# Patient Record
Sex: Male | Born: 1940 | Race: White | Hispanic: No | Marital: Married | Smoking: Former smoker
Health system: Southern US, Community
[De-identification: ages and names within clinical notes are randomized; demographics above are authoritative.]

## PROBLEM LIST (undated history)

## (undated) DIAGNOSIS — M069 Rheumatoid arthritis, unspecified: Secondary | ICD-10-CM

## (undated) DIAGNOSIS — I82409 Acute embolism and thrombosis of unspecified deep veins of unspecified lower extremity: Secondary | ICD-10-CM

## (undated) DIAGNOSIS — M1990 Unspecified osteoarthritis, unspecified site: Secondary | ICD-10-CM

## (undated) DIAGNOSIS — M1120 Other chondrocalcinosis, unspecified site: Secondary | ICD-10-CM

## (undated) DIAGNOSIS — I509 Heart failure, unspecified: Secondary | ICD-10-CM

## (undated) DIAGNOSIS — E139 Other specified diabetes mellitus without complications: Secondary | ICD-10-CM

## (undated) DIAGNOSIS — K5792 Diverticulitis of intestine, part unspecified, without perforation or abscess without bleeding: Secondary | ICD-10-CM

## (undated) DIAGNOSIS — J189 Pneumonia, unspecified organism: Secondary | ICD-10-CM

## (undated) DIAGNOSIS — D649 Anemia, unspecified: Secondary | ICD-10-CM

## (undated) DIAGNOSIS — Z95828 Presence of other vascular implants and grafts: Secondary | ICD-10-CM

## (undated) DIAGNOSIS — C61 Malignant neoplasm of prostate: Secondary | ICD-10-CM

## (undated) HISTORY — PX: EYE SURGERY: SHX253

## (undated) HISTORY — PX: CERVICAL FUSION: SHX112

## (undated) HISTORY — PX: TONSILLECTOMY: SUR1361

## (undated) HISTORY — DX: Rheumatoid arthritis, unspecified: M06.9

## (undated) HISTORY — PX: OTHER SURGICAL HISTORY: SHX169

## (undated) HISTORY — PX: JOINT REPLACEMENT: SHX530

---

## 1978-03-18 HISTORY — PX: BACK SURGERY: SHX140

## 1999-02-22 ENCOUNTER — Ambulatory Visit (HOSPITAL_COMMUNITY): Admission: RE | Admit: 1999-02-22 | Discharge: 1999-02-22 | Payer: Self-pay | Admitting: Neurosurgery

## 1999-02-22 ENCOUNTER — Encounter: Payer: Self-pay | Admitting: Neurosurgery

## 2010-05-17 HISTORY — PX: OTHER SURGICAL HISTORY: SHX169

## 2010-05-21 ENCOUNTER — Emergency Department (HOSPITAL_COMMUNITY)
Admission: EM | Admit: 2010-05-21 | Discharge: 2010-05-21 | Disposition: A | Discharge status: Nursing Home (Includes ICF) | Payer: Medicare Other | Attending: Emergency Medicine | Admitting: Emergency Medicine

## 2010-05-21 DIAGNOSIS — S61209A Unspecified open wound of unspecified finger without damage to nail, initial encounter: Secondary | ICD-10-CM | POA: Insufficient documentation

## 2010-05-21 DIAGNOSIS — W312XXA Contact with powered woodworking and forming machines, initial encounter: Secondary | ICD-10-CM | POA: Insufficient documentation

## 2010-05-21 DIAGNOSIS — IMO0002 Reserved for concepts with insufficient information to code with codable children: Secondary | ICD-10-CM | POA: Insufficient documentation

## 2010-05-21 LAB — POCT I-STAT, CHEM 8
BUN: 15 mg/dL (ref 6–23)
Calcium, Ion: 1.04 mmol/L — ABNORMAL LOW (ref 1.12–1.32)
Chloride: 106 mEq/L (ref 96–112)
Creatinine, Ser: 1.1 mg/dL (ref 0.4–1.5)
Glucose, Bld: 103 mg/dL — ABNORMAL HIGH (ref 70–99)
HCT: 40 % (ref 39.0–52.0)
Hemoglobin: 13.6 g/dL (ref 13.0–17.0)
Potassium: 4.1 mEq/L (ref 3.5–5.1)
Sodium: 139 mEq/L (ref 135–145)
TCO2: 25 mmol/L (ref 0–100)

## 2010-05-30 ENCOUNTER — Ambulatory Visit (HOSPITAL_COMMUNITY)
Admission: RE | Admit: 2010-05-30 | Discharge: 2010-05-30 | Disposition: A | Discharge status: Home / Self Care | Payer: Medicare Other | Source: Ambulatory Visit | Attending: Orthopedic Surgery | Admitting: Orthopedic Surgery

## 2010-05-30 ENCOUNTER — Other Ambulatory Visit: Payer: Self-pay | Admitting: Orthopedic Surgery

## 2010-05-30 ENCOUNTER — Other Ambulatory Visit (HOSPITAL_COMMUNITY): Payer: Self-pay | Admitting: Orthopedic Surgery

## 2010-05-30 ENCOUNTER — Encounter (HOSPITAL_COMMUNITY)
Admission: RE | Admit: 2010-05-30 | Discharge: 2010-05-30 | Payer: Medicare Other | Source: Ambulatory Visit | Attending: Orthopedic Surgery | Admitting: Orthopedic Surgery

## 2010-05-30 DIAGNOSIS — Z01818 Encounter for other preprocedural examination: Secondary | ICD-10-CM | POA: Insufficient documentation

## 2010-05-30 DIAGNOSIS — Z01812 Encounter for preprocedural laboratory examination: Secondary | ICD-10-CM | POA: Insufficient documentation

## 2010-05-30 DIAGNOSIS — M1710 Unilateral primary osteoarthritis, unspecified knee: Secondary | ICD-10-CM | POA: Insufficient documentation

## 2010-05-30 LAB — COMPREHENSIVE METABOLIC PANEL
ALT: 76 U/L — ABNORMAL HIGH (ref 0–53)
AST: 40 U/L — ABNORMAL HIGH (ref 0–37)
Albumin: 4 g/dL (ref 3.5–5.2)
Alkaline Phosphatase: 68 U/L (ref 39–117)
BUN: 15 mg/dL (ref 6–23)
CO2: 31 mEq/L (ref 19–32)
Calcium: 9.4 mg/dL (ref 8.4–10.5)
Chloride: 104 mEq/L (ref 96–112)
Creatinine, Ser: 1.05 mg/dL (ref 0.4–1.5)
GFR calc Af Amer: >60 mL/min (ref >60)
GFR calc non Af Amer: >60 mL/min (ref >60)
Glucose, Bld: 84 mg/dL (ref 70–99)
Potassium: 4.6 mEq/L (ref 3.5–5.1)
Sodium: 141 mEq/L (ref 135–145)
Total Bilirubin: 0.9 mg/dL (ref 0.3–1.2)
Total Protein: 7.6 g/dL (ref 6.0–8.3)

## 2010-05-30 LAB — URINALYSIS, ROUTINE W REFLEX MICROSCOPIC
Bilirubin Urine: NEGATIVE
Glucose, UA: NEGATIVE mg/dL
Hgb urine dipstick: NEGATIVE
Ketones, ur: NEGATIVE mg/dL
Nitrite: NEGATIVE
Protein, ur: NEGATIVE mg/dL
Specific Gravity, Urine: 1.018 (ref 1.005–1.030)
Urobilinogen, UA: 0.2 mg/dL (ref 0.0–1.0)
pH: 5.5 (ref 5.0–8.0)

## 2010-05-30 LAB — DIFFERENTIAL
Basophils Absolute: 0 10*3/uL (ref 0.0–0.1)
Basophils Relative: 0 % (ref 0–1)
Eosinophils Absolute: 0.1 10*3/uL (ref 0.0–0.7)
Eosinophils Relative: 2 % (ref 0–5)
Lymphocytes Relative: 34 % (ref 12–46)
Lymphs Abs: 1.3 10*3/uL (ref 0.7–4.0)
Monocytes Absolute: 0.3 10*3/uL (ref 0.1–1.0)
Monocytes Relative: 9 % (ref 3–12)
Neutro Abs: 2.1 10*3/uL (ref 1.7–7.7)
Neutrophils Relative %: 55 % (ref 43–77)

## 2010-05-30 LAB — CBC
HCT: 40.3 % (ref 39.0–52.0)
Hemoglobin: 13.6 g/dL (ref 13.0–17.0)
MCH: 31.3 pg (ref 26.0–34.0)
MCHC: 33.7 g/dL (ref 30.0–36.0)
MCV: 92.9 fL (ref 78.0–100.0)
Platelets: 183 10*3/uL (ref 150–400)
RBC: 4.34 MIL/uL (ref 4.22–5.81)
RDW: 11.6 % (ref 11.5–15.5)
WBC: 3.8 10*3/uL — ABNORMAL LOW (ref 4.0–10.5)

## 2010-05-30 LAB — PROTIME-INR
INR: 0.97 (ref 0.00–1.49)
Prothrombin Time: 13.1 seconds (ref 11.6–15.2)

## 2010-06-07 ENCOUNTER — Inpatient Hospital Stay (HOSPITAL_COMMUNITY)
Admission: RE | Admit: 2010-06-07 | Discharge: 2010-06-09 | DRG: 470 | Disposition: A | Discharge status: Home Health | Payer: Medicare Other | Source: Ambulatory Visit | Attending: Orthopedic Surgery | Admitting: Orthopedic Surgery

## 2010-06-07 DIAGNOSIS — M1710 Unilateral primary osteoarthritis, unspecified knee: Principal | ICD-10-CM | POA: Diagnosis present

## 2010-06-07 DIAGNOSIS — D62 Acute posthemorrhagic anemia: Secondary | ICD-10-CM | POA: Diagnosis not present

## 2010-06-07 DIAGNOSIS — M5030 Other cervical disc degeneration, unspecified cervical region: Secondary | ICD-10-CM | POA: Diagnosis present

## 2010-06-07 DIAGNOSIS — M21169 Varus deformity, not elsewhere classified, unspecified knee: Secondary | ICD-10-CM | POA: Diagnosis present

## 2010-06-07 DIAGNOSIS — K5730 Diverticulosis of large intestine without perforation or abscess without bleeding: Secondary | ICD-10-CM | POA: Diagnosis present

## 2010-06-07 LAB — ABO/RH: ABO/RH(D): A POS

## 2010-06-07 LAB — CROSSMATCH

## 2010-06-08 LAB — CBC
HCT: 34.2 % — ABNORMAL LOW (ref 39.0–52.0)
Hemoglobin: 11.3 g/dL — ABNORMAL LOW (ref 13.0–17.0)
RBC: 3.67 MIL/uL — ABNORMAL LOW (ref 4.22–5.81)
RDW: 11.7 % (ref 11.5–15.5)
WBC: 5.5 10*3/uL (ref 4.0–10.5)

## 2010-06-08 LAB — BASIC METABOLIC PANEL
GFR calc non Af Amer: >60 mL/min (ref >60)
Glucose, Bld: 141 mg/dL — ABNORMAL HIGH (ref 70–99)
Potassium: 5.1 mEq/L (ref 3.5–5.1)
Sodium: 136 mEq/L (ref 135–145)

## 2010-06-09 LAB — CBC
HCT: 32.6 % — ABNORMAL LOW (ref 39.0–52.0)
Hemoglobin: 10.6 g/dL — ABNORMAL LOW (ref 13.0–17.0)
MCV: 92.6 fL (ref 78.0–100.0)
WBC: 6.1 10*3/uL (ref 4.0–10.5)

## 2010-06-09 LAB — BASIC METABOLIC PANEL
BUN: 9 mg/dL (ref 6–23)
CO2: 28 mEq/L (ref 19–32)
Chloride: 107 mEq/L (ref 96–112)
Glucose, Bld: 121 mg/dL — ABNORMAL HIGH (ref 70–99)
Potassium: 4 mEq/L (ref 3.5–5.1)
Sodium: 138 mEq/L (ref 135–145)

## 2010-06-13 NOTE — Consult Note (Signed)
  NAMEDOMANI, BAKOS NO.:  [id number]  MEDICAL RECORD NO.:  [id number]           PATIENT TYPE:  E  LOCATION:  [facility]                         FACILITY:  [facility]  PHYSICIAN:  Artist Pais. Tahliyah Anagnos, M.D.DATE OF BIRTH:  [birth year]  DATE OF CONSULTATION:  05/21/2010 DATE OF DISCHARGE:  05/21/2010                                CONSULTATION   REQUESTING PHYSICIAN:  Gwyneth Sprout, MD  REASON FOR CONSULTATION:  Mr. Rollo is a 70 year old right-hand dominant male transferred from [facility] by private car, status post table saw injury to nondominant left long finger.  He is 70 years old.  ALLERGIES:  No known drug allergies.  CURRENT MEDICATIONS:  He is currently taking fish oil on a daily basis. No other medications.  No recent hospitalizations or surgeries.  He is being taken to the operating room in 2 weeks for a knee replacement with Dr. Jonny Ruiz Rendall here in [place].  He smokes cigars occasionally and occasional alcohol use.  PHYSICAL EXAMINATION:  GENERAL:  A well-nourished male, pleasant, alert, and oriented x3. EXTREMITIES:  He has a longitudinal laceration of the long finger from PIP to the tip with exposed tendon as well as appears to be a fracture of the proximal middle phalanx without an x-ray with questionable DIP weakness with extension.  IMPRESSION:  A 70 year old male with an open table saw injury to his nondominant left long finger.  RECOMMENDATIONS:  To be taken to the operating room for explore and repair as needed.  He understands the risks and benefits, we will do this as soon as  possible here at [facility] as an outpatient with possible 23-hour stay.     Artist Pais Mina Marble, M.D.     MAW/MEDQ  D:  05/21/2010  T:  05/22/2010  Job:  [id number]  Electronically Signed by Dairl Ponder M.D. on 06/13/2010 11:28:04 AM

## 2010-06-13 NOTE — Op Note (Signed)
  NAMECHRISTIANJAMES, David Joseph NO.:  [id number]  MEDICAL RECORD NO.:  [id number]           PATIENT TYPE:  E  LOCATION:  [facility]                         FACILITY:  [facility]  PHYSICIAN:  Artist Pais. Veldon Wager, M.D.DATE OF BIRTH:  1941/01/26  DATE OF PROCEDURE:  05/21/2010 DATE OF DISCHARGE:  05/21/2010                              OPERATIVE REPORT   PREOPERATIVE DIAGNOSIS:  Table saw injury, left long finger with open middle phalangeal fracture with extensor tendon laceration.  POSTOPERATIVE DIAGNOSIS:  Table saw injury, left long finger with open middle phalangeal fracture with extensor tendon laceration.  PROCEDURES:  I and D of above, debridement of devitalized tissue, as well as primary repair of extensor tendon, left long finger and open treatment, middle phalangeal fracture, left long finger.  SURGEON:  Artist Pais. Mina Marble, MD  ASSISTANT:  None.  ANESTHESIA:  General.  TOURNIQUET TIME:  28 minutes.  COMPLICATIONS:  None.  DRAINS:  None.  The patient was taken to the operating suite.  After induction of adequate general anesthesia, left upper extremity was prepped and draped in usual sterile fashion.  An Esmarch was used to exsanguinate the limb. Tourniquet was then inflated to 250 mmHg.  At this point in time, a longitudinal wound from the PIP joint to the tip of finger starting midline of the PIP joint and going to the ulnar border was examined. There was devitalized tissue, some clot, and some foreign material, and the wound was thoroughly irrigated with a liter of normal saline and debrided of all nonviable material.  There was an oblique laceration in extensor mechanism from the PIP joint to the DIP joint.  This was carefully reapproximated using 4-0 Mersilene in a running locked epitendinous-type suture.  There was also an open fracture of the middle phalanx as well on the ulnar side, which was debrided of clot, treated with closed treatment after a  thorough debridement.  There was some loss of the eponychial fold on the ulnar side.  This was repaired using 4-0 Vicryl, suture in the skin edge to the nail plate along the ulnar side. At the end of the procedure, the wound was approximated well with the extensor tendon approximated as well, was then dressed with Xeroform, 4 x 4's, and Coban wrap with the MP, PIP, and DIP joints in extension.  The patient also had 0.25% plain Marcaine injected for pain control.  The patient tolerated the procedure well, returned in stable fashion.     Artist Pais Mina Marble, M.D.     MAW/MEDQ  D:  05/21/2010  T:  05/22/2010  Job:  [id number]  Electronically Signed by Dairl Ponder M.D. on 06/13/2010 11:27:59 AM

## 2010-06-19 NOTE — Op Note (Signed)
David Joseph, David Joseph               ACCOUNT NO.:  [id number]  MEDICAL RECORD NO.:  [id number]           PATIENT TYPE:  I  LOCATION:  1608                         FACILITY:  [facility]  PHYSICIAN:  Yaiden Yang L. Rendall, M.D.  DATE OF BIRTH:  [birth year]  DATE OF PROCEDURE: DATE OF DISCHARGE:                              OPERATIVE REPORT   PREOPERATIVE DIAGNOSIS:  Osteoarthritis, right knee.  SURGICAL PROCEDURES:  Right LCS total knee arthroplasty with computer navigation assistance.  POSTOPERATIVE DIAGNOSIS:  Osteoarthritis, right knee.  SURGEON:  Aric Jost L. Rendall, M.D.  ASSISTANT:  Legrand Pitts. Duffy, P.A., present and participating in entire procedure.  ANESTHESIA:  General with a right femoral nerve block.  PATHOLOGY:  The patient has bone against bone medially with fixed varus deformity of 4 degrees and peeling hyaline cartilage on the surfaces of the medial femoral condyle and tibia.  JUSTIFICATION FOR PROCEDURE:  Chronic pain despite conservative measures.  DESCRIPTION OF PROCEDURE:  Under general anesthesia with femoral nerve block, the right leg was prepared with DuraPrep and draped as a sterile field.  It was wrapped out with an Esmarch and a sterile tourniquet was elevated at 350 mmHg.  Midline incision was made.  The patella was everted.  The femur was measured at a standard plus.  The knee was debrided in preparation for computer mapping.  Schanz pins were then placed in the superior medial tibia and distal femur.  The arrays were set up.  Femoral head was identified.  Medial and lateral malleoli were identified.  At this point, mapping was completed on tibia and femur. Using the computer mapping, the first guide was used for proximal tibial resection.  This was done within 1 degree of anatomical alignment.  The tensioner was then used and the medial and lateral collateral ligaments were balanced within 1 degree of alignment.  Attention was then turned to the flexion gap  which was measured.  The first femoral block was then used with the computer and anterior and posterior flares of the distal femur were resected for a standard plus.  The distal femoral cut was then made again for a standard plus.  The computer estimated a 10-mm block and with the 10-mm flexion and extension gaps measured, there was just minimal laxity.  The lamina spreader was then inserted.  Remnants of the menisci and cruciates were debrided.  Spurs were taken off the back of the femoral condyle.  Recessing guide was then used.  At this point, the tibia was exposed with a Mchale and Hohmann.  It was sized to a #4.  Center peg hole with keel was inserted.  At this point, trial of a #10 bearing with the standard plus femur gives excellent fit, alignment and slight back knee.  A 12.5 bearing gives virtual anatomic alignment within 1 degree and full extension.  Permanent components were obtained.  Bony surfaces were prepared with pulse irrigation.  All components were then cemented in place.  The tourniquet was let down once the cement was hardened at 57 minutes.  Multiple small vessels were cauterized.  Medium Hemovac drain was inserted.  An anterior  synovectomy was done while cement was hardening.  The knee was then closed in layers with #1 Tycron, 2-0 Vicryl and skin clips.  The patient tolerated the procedure well and returned to recovery in good condition.     Chia Mowers L. Priscille Kluver, M.D.     Renato Gails  D:  06/07/2010  T:  06/08/2010  Job:  [id number]  Electronically Signed by Erasmo Leventhal M.D. on 06/19/2010 01:43:24 PM

## 2010-07-17 NOTE — Discharge Summary (Signed)
NAMEKENDRELL, David Joseph               ACCOUNT NO.:  [id number]  MEDICAL RECORD NO.:  [id number]           PATIENT TYPE:  I  LOCATION:  1608                         FACILITY:  [facility]  PHYSICIAN:  Carl Bleecker L. Rendall, M.D.  DATE OF BIRTH:  Jul 04, 1940  DATE OF ADMISSION:  06/07/2010 DATE OF DISCHARGE:  06/09/2010                              DISCHARGE SUMMARY   ADMISSION DIAGNOSES: 1. End-stage osteoarthritis, right knee. 2. Diverticulosis. 3. Degenerative disk disease, cervical spine. 4. History of lumbar surgery.  DISCHARGE DIAGNOSES: 1. End-stage osteoarthritis, right knee, status post right total knee     arthroplasty. 2. Acute blood loss anemia secondary to surgery. 3. Diverticulosis. 4. Degenerative disk disease, cervical spine. 5. History of lumbar surgery.  SURGICAL PROCEDURES:  On June 07, 2010, Mr. Sheahan underwent a right total knee arthroplasty with computer navigation by Dr. Jonny Ruiz L.  Rendall assisted by Arnoldo Morale PA-C.  He had a DePuy LCS complete metal back patella cemented size standard plus placed with a tibial tray rotating platform M.B.T. keel size 4 cemented.  A primary femoral component cemented size standard plus right with an LCS complete tibial insert 12.5 mm thickness standard plus.  COMPLICATIONS:  None.  CONSULT:  Physical Therapy consult, June 08, 2010.  HISTORY OF PRESENT ILLNESS:  This 70 year old white male patient presented to Dr. Priscille Kluver with a 1-year history of gradual onset of progressive right knee pain without injury or surgery.  The pain is now constant, sharp to dull sensation over the medial knee with some radiation distally into the tibia.  It increases with range of motion and weightbearing and decreases with ice and Aleve.  The knee pops, swells and keeps him up at night.  He has failed conservative treatment and x-rays showed arthritic changes of the knee.  Because of this, he is presenting for a right knee replacement.  HOSPITAL  COURSE:  Mr. Goldammer tolerated his surgical procedure well without immediate postoperative complications.  He was transferred to the orthopedic floor.  On postop day #1, T-max was 100.9, vitals were stable.  Hemoglobin 11.3, hematocrit 34.2.  He was started on therapy per protocol, weaned off his oxygen, and switched to p.o. pain meds and plans were made for discharge home over the next several days.  On postop day #2, he was doing well.  He remained afebrile, vitals stable.  Hemoglobin stable at 10.6 with hematocrit of 32.6.  He did well enough with therapy that day.  It was felt he was ready for DC home and was DC'ed home later that day.  DISCHARGE INSTRUCTIONS:  DIET:  He can resume his regular prehospitalization diet.  MEDICATIONS:  Please see the patient med home discharge home instructions for complete documentation of his medications but we did add in addition to his normal meds, Celebrex once a day, Robaxin p.r.n., Percocet p.r.n., and Xarelto 10 mg for 2 weeks.  ACTIVITY:  He can be out of bed weightbearing as tolerated on the right leg with use of a walker.  No lifting or driving for 6 weeks.  Please see the white total joint discharge sheet for further activity  instructions.  WOUND CARE:  Please see the white total joint discharge sheet for complete wound care instructions.  FOLLOWUP:  He needs to follow with Dr. Priscille Kluver in our office in [place] on Monday June 18, 2010, and needs to call [phone] for that appointment. He is arranged for home health PT per [facility] and [facility] via [employer].  LABORATORY DATA:  Hemoglobin/hematocrit ranged from 11.3 and 34.2 on the 23rd to 10.6 and 32.6 on the 24th.  Platelets went from 138 on the 23rd to 116 on the 24th.  Glucose ranged from 141 on the 23rd to 121 on the 24th.  All other laboratory studies were within normal limits.     Legrand Pitts Duffy, P.A.   ______________________________ Carlisle Beers. Priscille Kluver,  M.D.    KED/MEDQ  D:  06/27/2010  T:  06/27/2010  Job:  [id number]  Electronically Signed by Arnoldo Morale P.A. on 07/04/2010 09:24:36 AM Electronically Signed by Erasmo Leventhal M.D. on 07/17/2010 01:07:07 PM

## 2011-03-28 DIAGNOSIS — IMO0002 Reserved for concepts with insufficient information to code with codable children: Secondary | ICD-10-CM | POA: Diagnosis not present

## 2011-05-29 DIAGNOSIS — M25469 Effusion, unspecified knee: Secondary | ICD-10-CM | POA: Diagnosis not present

## 2011-05-29 DIAGNOSIS — M1710 Unilateral primary osteoarthritis, unspecified knee: Secondary | ICD-10-CM | POA: Diagnosis not present

## 2011-06-12 DIAGNOSIS — M1710 Unilateral primary osteoarthritis, unspecified knee: Secondary | ICD-10-CM | POA: Diagnosis not present

## 2011-07-29 DIAGNOSIS — E7800 Pure hypercholesterolemia, unspecified: Secondary | ICD-10-CM | POA: Diagnosis not present

## 2011-07-29 DIAGNOSIS — Z79899 Other long term (current) drug therapy: Secondary | ICD-10-CM | POA: Diagnosis not present

## 2011-08-01 DIAGNOSIS — Z0000 Encounter for general adult medical examination without abnormal findings: Secondary | ICD-10-CM | POA: Diagnosis not present

## 2011-09-26 DIAGNOSIS — M1710 Unilateral primary osteoarthritis, unspecified knee: Secondary | ICD-10-CM | POA: Diagnosis not present

## 2011-10-17 HISTORY — PX: COLONOSCOPY: SHX174

## 2011-11-07 DIAGNOSIS — M1710 Unilateral primary osteoarthritis, unspecified knee: Secondary | ICD-10-CM | POA: Diagnosis not present

## 2011-11-12 DIAGNOSIS — Z1211 Encounter for screening for malignant neoplasm of colon: Secondary | ICD-10-CM | POA: Diagnosis not present

## 2011-11-27 DIAGNOSIS — L570 Actinic keratosis: Secondary | ICD-10-CM | POA: Diagnosis not present

## 2011-11-27 DIAGNOSIS — D485 Neoplasm of uncertain behavior of skin: Secondary | ICD-10-CM | POA: Diagnosis not present

## 2011-11-27 DIAGNOSIS — C4401 Basal cell carcinoma of skin of lip: Secondary | ICD-10-CM | POA: Diagnosis not present

## 2011-11-27 DIAGNOSIS — Z85828 Personal history of other malignant neoplasm of skin: Secondary | ICD-10-CM | POA: Diagnosis not present

## 2012-01-09 DIAGNOSIS — C44320 Squamous cell carcinoma of skin of unspecified parts of face: Secondary | ICD-10-CM | POA: Diagnosis not present

## 2012-01-09 DIAGNOSIS — L570 Actinic keratosis: Secondary | ICD-10-CM | POA: Diagnosis not present

## 2012-01-20 ENCOUNTER — Other Ambulatory Visit: Payer: Self-pay | Admitting: Orthopedic Surgery

## 2012-01-20 MED ORDER — DEXAMETHASONE SODIUM PHOSPHATE 10 MG/ML IJ SOLN: 10.0000 mg | Freq: Once | INTRAMUSCULAR | Status: DC

## 2012-01-20 MED ORDER — BUPIVACAINE 0.25 % ON-Q PUMP SINGLE CATH 300ML: 300.0000 mL | INJECTION | Status: DC

## 2012-01-20 NOTE — Progress Notes (Signed)
Preoperative surgical orders have been place into the Epic hospital system for David Joseph on 01/20/2012, 8:44 AM  by Patrica Duel for surgery on 02/19/12.  Preop Total Knee orders including Bupivacaine On-Q pump, IV Tylenol, and IV Decadron as long as there are no contraindications to the above medications. Avel Peace, PA-C

## 2012-02-06 ENCOUNTER — Encounter (HOSPITAL_COMMUNITY): Payer: Self-pay | Admitting: Pharmacy Technician

## 2012-02-07 NOTE — Patient Instructions (Addendum)
[address]  02/07/2012   Your procedure is scheduled on: 02/19/12  Report to [facility] at 12:00 PM.  Call this number if you have problems the morning of surgery [phone]: [phone]   Remember:   Do not eat food  After Midnight.  Clear liquids midnight until 830 am then nothing.      Take these medicines the morning of surgery with A SIP OF WATER: no medications needed.    Do not wear jewelry, make-up or nail polish.  Do not wear lotions, powders, or perfumes. You may wear deodorant.  Do not shave 48 hours prior to surgery. Men may shave face and neck.  Do not bring valuables to the hospital.  Contacts, dentures or bridgework may not be worn into surgery.  Leave suitcase in the car. After surgery it may be brought to your room.  For patients admitted to the hospital, checkout time is 11:00 AM the day of discharge.     Special Instructions: Shower using CHG 2 nights before surgery and the night before surgery.  If you shower the day of surgery use CHG.  Use special wash - you have one bottle of CHG for all showers.  You should use approximately 1/3 of the bottle for each shower.   Please read over the following fact sheets that you were given: MRSA Information, blood fact sheet, incentive spirometer fact sheet, clear liquid fact sheet Birdie Sons, RN  pre op nurse call if needed [phone]

## 2012-02-10 ENCOUNTER — Encounter (HOSPITAL_COMMUNITY): Payer: Self-pay

## 2012-02-10 ENCOUNTER — Encounter (HOSPITAL_COMMUNITY)
Admission: RE | Admit: 2012-02-10 | Discharge: 2012-02-10 | Disposition: A | Discharge status: Home / Self Care | Payer: Medicare Other | Source: Ambulatory Visit | Attending: Orthopedic Surgery | Admitting: Orthopedic Surgery

## 2012-02-10 HISTORY — DX: Pneumonia, unspecified organism: J18.9

## 2012-02-10 HISTORY — DX: Diverticulitis of intestine, part unspecified, without perforation or abscess without bleeding: K57.92

## 2012-02-10 HISTORY — DX: Unspecified osteoarthritis, unspecified site: M19.90

## 2012-02-10 LAB — CBC
HCT: 39.4 % (ref 39.0–52.0)
Hemoglobin: 13.4 g/dL (ref 13.0–17.0)
MCH: 30.8 pg (ref 26.0–34.0)
MCHC: 34 g/dL (ref 30.0–36.0)
MCV: 90.6 fL (ref 78.0–100.0)
RBC: 4.35 MIL/uL (ref 4.22–5.81)

## 2012-02-10 LAB — URINALYSIS, ROUTINE W REFLEX MICROSCOPIC
Glucose, UA: NEGATIVE mg/dL
Ketones, ur: NEGATIVE mg/dL
Leukocytes, UA: NEGATIVE
Protein, ur: NEGATIVE mg/dL
Urobilinogen, UA: 0.2 mg/dL (ref 0.0–1.0)

## 2012-02-10 LAB — COMPREHENSIVE METABOLIC PANEL
ALT: 28 U/L (ref 0–53)
BUN: 15 mg/dL (ref 6–23)
CO2: 30 mEq/L (ref 19–32)
Calcium: 9.6 mg/dL (ref 8.4–10.5)
Creatinine, Ser: 0.92 mg/dL (ref 0.50–1.35)
GFR calc Af Amer: >90 mL/min (ref >90)
GFR calc non Af Amer: 84 mL/min — ABNORMAL LOW (ref >90)
Glucose, Bld: 97 mg/dL (ref 70–99)
Sodium: 139 mEq/L (ref 135–145)

## 2012-02-10 LAB — PROTIME-INR: Prothrombin Time: 12.4 seconds (ref 11.6–15.2)

## 2012-02-11 DIAGNOSIS — M1710 Unilateral primary osteoarthritis, unspecified knee: Secondary | ICD-10-CM | POA: Diagnosis not present

## 2012-02-18 ENCOUNTER — Other Ambulatory Visit: Payer: Self-pay | Admitting: Orthopedic Surgery

## 2012-02-18 NOTE — H&P (Signed)
David Joseph  DOB: 04/14/1940 Married / Language: English / Race: White Male  Date of Admission:  02/19/2012  Chief Complaint:  Right Knee Pain and Instability  History of Present Illness The patient is a 71 year old male who comes in for a preoperative History and Physical. The patient is scheduled for a right total knee arthroplasty revision to be performed by Dr. Frank V. Aluisio, MD at [facility] on 02/19/2012. The patient is a 71 year old male who presents for follow up of their knee. The patient is being followed for their right instability. Symptoms reported today include: pain, swelling and instability. Current treatment includes: bracing (helps him do more). The following medication has been used for pain control: none. Gardner has an unstable right total knee arthroplasty. He has had recurrent effusions and knee pain. He had an infection work up previously that was negative. The swelling is not as bad. When he wears the brace he feels more stable but he still has the pain. He still has a lot of difficulty with stairs. His brace makes it a little easier on stairs but not to the point where he is doing them routinely. He is ready to get the knee revised. They have been treated conservatively in the past for the above stated problem and despite conservative measures, they continue to have progressive pain and severe functional limitations and dysfunction. They have failed non-operative management including home exercise, medications, and bracing. It is felt that they would benefit from undergoing revision right total joint replacement. Risks and benefits of the procedure have been discussed with the patient and they elect to proceed with surgery. There are no active contraindications to surgery such as ongoing infection or rapidly progressive neurological disease.   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 grandmother mothers side Heart Disease. mother Osteoarthritis. mother   Social History Exercise. Exercises rarely; does individual sport Illicit drug use. no Living situation. live with spouse Current work status. retired Drug/Alcohol Rehab (Currently). no Drug/Alcohol Rehab (Previously). no Marital status. married Tobacco use. never smoker Number of flights of stairs before winded. 4-5 Pain Contract. no Tobacco / smoke exposure. no Children. 2 Alcohol use. current drinker; drinks wine; 5-7 per week Post-Surgical Plans. Plan is to go home.   Past Surgical History Tonsillectomy Total Knee Replacement. Date: 06/07/2010. right Cataract Surgery. Date: 2010. bilateral Spinal Surgery. Date: 1980. L5-S1 Finger Surgery   Medical History Osteoarthritis Skin Cancer Pneumonia Diverticulosis Degenerative Disc Disease Diverticulitis Of Colon. No recent flares in the past 10 years.   Review of Systems General:Not Present- Chills, Fever, Night Sweats, Fatigue, Weight Gain, Weight Loss and Memory Loss. Skin:Not Present- Hives, Itching, Rash, Eczema and Lesions. HEENT:Not Present- Tinnitus, Headache, Double Vision, Visual Loss, Hearing Loss and Dentures. Respiratory:Not Present- Shortness of breath with exertion, Shortness of breath at rest, Allergies, Coughing up blood and Chronic Cough. Cardiovascular:Not Present- Chest Pain, Racing/skipping heartbeats, Difficulty Breathing Lying Down, Murmur, Swelling and Palpitations. Gastrointestinal:Not Present- Bloody Stool, Heartburn, Abdominal Pain, Vomiting, Nausea, Constipation, Diarrhea, Difficulty Swallowing, Jaundice and Loss of appetitie. Male Genitourinary:Not Present- Urinary frequency, Blood in Urine, Weak urinary stream, Discharge, Flank Pain, Incontinence, Painful Urination, Urgency, Urinary Retention and Urinating at Night. Musculoskeletal:Present- Joint  Swelling, Joint Pain, Morning Stiffness and Spasms. Not Present- Muscle Weakness, Muscle Pain and Back Pain. Neurological:Not Present- Tremor, Dizziness, Blackout spells, Paralysis, Difficulty with balance and Weakness. Psychiatric:Not Present- Insomnia.     Vitals Weight: 180 lb Height: 69 in Body Surface Area: 1.99 m Body Mass Index: 26.58 kg/m Pulse: 56 (Regular) Resp.: 12 (Unlabored) BP: 136/72 (Sitting, Right Arm, Standard)    Physical Exam The physical exam findings are as follows:  Note: Patient is a 71 year old male with continued knee instability.   General Mental Status - Alert, cooperative and good historian. General Appearance- pleasant. Not in acute distress. Orientation- Oriented X3. Build & Nutrition- Well nourished and Well developed.   Head and Neck Head- normocephalic, atraumatic . Neck Global Assessment- supple. no bruit auscultated on the right and no bruit auscultated on the left.   Eye Pupil- Bilateral- Regular and Round. Motion- Bilateral- EOMI. wears glasses (readers)  Chest and Lung Exam Auscultation: Breath sounds:- clear at anterior chest wall and - clear at posterior chest wall. Adventitious sounds:- No Adventitious sounds.   Cardiovascular Auscultation:Rhythm- Regular rate and rhythm. Heart Sounds- S1 WNL and S2 WNL. Murmurs & Other Heart Sounds:Auscultation of the heart reveals - No Murmurs.   Abdomen Palpation/Percussion:Tenderness- Abdomen is non-tender to palpation. Rigidity (guarding)- Abdomen is soft. Auscultation:Auscultation of the abdomen reveals - Bowel sounds normal.   Male Genitourinary Not done, not pertinent to present illness  Musculoskeletal He is alert and oriented in no apparent distress. Both hips with normal range of motion, no discomfort. Left knee, normal exam. Right knee, trace effusion. Range of motion is about 5 to 110 degrees. He has fairly significant varus and  valgus laxity and extension and fairly significant AP laxity and flexion. He does have pain when stressing the knee, either varus/valgus or anterior/posterior. His pulses, sensation, and motor are intact distally.  RADIOGRAPHS: AP of both knees and lateral show the prosthesis in good alignment with no definitive periprosthetic abnormalities.  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 Aluisio.  Plan is to go home.  PCP - Dr. Kevin Howard  Signed electronically by DREW L PERKINS, PA-C  

## 2012-02-19 ENCOUNTER — Inpatient Hospital Stay (HOSPITAL_COMMUNITY)
Admission: RE | Admit: 2012-02-19 | Discharge: 2012-02-21 | DRG: 468 | Disposition: A | Discharge status: Home Health | Payer: Medicare Other | Source: Ambulatory Visit | Attending: Orthopedic Surgery | Admitting: Orthopedic Surgery

## 2012-02-19 ENCOUNTER — Encounter (HOSPITAL_COMMUNITY): Payer: Self-pay | Admitting: Anesthesiology

## 2012-02-19 ENCOUNTER — Encounter (HOSPITAL_COMMUNITY): Payer: Self-pay | Admitting: *Deleted

## 2012-02-19 ENCOUNTER — Encounter (HOSPITAL_COMMUNITY)
Admission: RE | Disposition: A | Discharge status: Home Health | Payer: Self-pay | Source: Ambulatory Visit | Attending: Orthopedic Surgery

## 2012-02-19 ENCOUNTER — Inpatient Hospital Stay (HOSPITAL_COMMUNITY): Payer: Medicare Other | Admitting: Anesthesiology

## 2012-02-19 DIAGNOSIS — I2510 Atherosclerotic heart disease of native coronary artery without angina pectoris: Secondary | ICD-10-CM | POA: Diagnosis not present

## 2012-02-19 DIAGNOSIS — T84029A Dislocation of unspecified internal joint prosthesis, initial encounter: Secondary | ICD-10-CM | POA: Diagnosis not present

## 2012-02-19 DIAGNOSIS — Z01812 Encounter for preprocedural laboratory examination: Secondary | ICD-10-CM

## 2012-02-19 DIAGNOSIS — T84069A Wear of articular bearing surface of unspecified internal prosthetic joint, initial encounter: Secondary | ICD-10-CM | POA: Diagnosis not present

## 2012-02-19 DIAGNOSIS — T84022A Instability of internal right knee prosthesis, initial encounter: Secondary | ICD-10-CM

## 2012-02-19 DIAGNOSIS — T84099A Other mechanical complication of unspecified internal joint prosthesis, initial encounter: Secondary | ICD-10-CM | POA: Diagnosis not present

## 2012-02-19 DIAGNOSIS — M1710 Unilateral primary osteoarthritis, unspecified knee: Secondary | ICD-10-CM | POA: Diagnosis present

## 2012-02-19 DIAGNOSIS — Z96659 Presence of unspecified artificial knee joint: Secondary | ICD-10-CM

## 2012-02-19 DIAGNOSIS — D62 Acute posthemorrhagic anemia: Secondary | ICD-10-CM

## 2012-02-19 DIAGNOSIS — I252 Old myocardial infarction: Secondary | ICD-10-CM

## 2012-02-19 DIAGNOSIS — Y831 Surgical operation with implant of artificial internal device as the cause of abnormal reaction of the patient, or of later complication, without mention of misadventure at the time of the procedure: Secondary | ICD-10-CM | POA: Diagnosis present

## 2012-02-19 HISTORY — PX: TOTAL KNEE REVISION: SHX996

## 2012-02-19 LAB — TYPE AND SCREEN: ABO/RH(D): A POS

## 2012-02-19 SURGERY — TOTAL KNEE REVISION
Anesthesia: General | Site: Knee | Laterality: Right | Wound class: Clean

## 2012-02-19 MED ORDER — ACETAMINOPHEN 10 MG/ML IV SOLN
1000.0000 mg | Freq: Four times a day (QID) | INTRAVENOUS | Status: AC
  Administered 2012-02-20 (×4): 1000 mg via INTRAVENOUS
  Filled 2012-02-19 (×7): qty 100

## 2012-02-19 MED ORDER — OXYCODONE HCL 5 MG PO TABS: 5.0000 mg | ORAL_TABLET | Freq: Once | ORAL | Status: DC | PRN

## 2012-02-19 MED ORDER — SUFENTANIL CITRATE 50 MCG/ML IV SOLN
INTRAVENOUS | Status: DC | PRN
  Administered 2012-02-19 (×5): 10 ug via INTRAVENOUS

## 2012-02-19 MED ORDER — ACETAMINOPHEN 325 MG PO TABS: 650.0000 mg | ORAL_TABLET | Freq: Four times a day (QID) | ORAL | Status: DC | PRN

## 2012-02-19 MED ORDER — OXYCODONE HCL 5 MG/5ML PO SOLN
5.0000 mg | Freq: Once | ORAL | Status: DC | PRN
  Filled 2012-02-19: qty 5

## 2012-02-19 MED ORDER — HYDROMORPHONE HCL PF 1 MG/ML IJ SOLN
INTRAMUSCULAR | Status: DC | PRN
  Administered 2012-02-19 (×2): 1 mg via INTRAVENOUS

## 2012-02-19 MED ORDER — MIDAZOLAM HCL 5 MG/5ML IJ SOLN
INTRAMUSCULAR | Status: DC | PRN
  Administered 2012-02-19: 2 mg via INTRAVENOUS

## 2012-02-19 MED ORDER — DOCUSATE SODIUM 100 MG PO CAPS
100.0000 mg | ORAL_CAPSULE | Freq: Two times a day (BID) | ORAL | Status: DC
  Administered 2012-02-19 – 2012-02-21 (×4): 100 mg via ORAL

## 2012-02-19 MED ORDER — METOCLOPRAMIDE HCL 10 MG PO TABS: 5.0000 mg | ORAL_TABLET | Freq: Three times a day (TID) | ORAL | Status: DC | PRN

## 2012-02-19 MED ORDER — HYDROMORPHONE HCL PF 1 MG/ML IJ SOLN: 0.2500 mg | INTRAMUSCULAR | Status: DC | PRN

## 2012-02-19 MED ORDER — INFLUENZA VIRUS VACC SPLIT PF IM SUSP: 0.5000 mL | INTRAMUSCULAR | Status: DC | PRN

## 2012-02-19 MED ORDER — MEPERIDINE HCL 50 MG/ML IJ SOLN
6.2500 mg | INTRAMUSCULAR | Status: DC | PRN
  Administered 2012-02-19: 12.5 mg via INTRAVENOUS

## 2012-02-19 MED ORDER — ONDANSETRON HCL 4 MG/2ML IJ SOLN
INTRAMUSCULAR | Status: DC | PRN
  Administered 2012-02-19: 4 mg via INTRAVENOUS

## 2012-02-19 MED ORDER — MEPERIDINE HCL 50 MG/ML IJ SOLN
INTRAMUSCULAR | Status: AC
  Filled 2012-02-19: qty 1

## 2012-02-19 MED ORDER — METHOCARBAMOL 100 MG/ML IJ SOLN
500.0000 mg | Freq: Four times a day (QID) | INTRAVENOUS | Status: DC | PRN
  Administered 2012-02-19: 500 mg via INTRAVENOUS
  Filled 2012-02-19: qty 5

## 2012-02-19 MED ORDER — 0.9 % SODIUM CHLORIDE (POUR BTL) OPTIME
TOPICAL | Status: DC | PRN
  Administered 2012-02-19: 1000 mL

## 2012-02-19 MED ORDER — MORPHINE SULFATE 2 MG/ML IJ SOLN: 1.0000 mg | INTRAMUSCULAR | Status: DC | PRN

## 2012-02-19 MED ORDER — TRAMADOL HCL 50 MG PO TABS: 50.0000 mg | ORAL_TABLET | Freq: Four times a day (QID) | ORAL | Status: DC | PRN

## 2012-02-19 MED ORDER — CEFAZOLIN SODIUM-DEXTROSE 2-3 GM-% IV SOLR
2.0000 g | INTRAVENOUS | Status: AC
  Administered 2012-02-19: 2 g via INTRAVENOUS

## 2012-02-19 MED ORDER — SODIUM CHLORIDE 0.9 % IR SOLN
Status: DC | PRN
  Administered 2012-02-19: 3000 mL

## 2012-02-19 MED ORDER — PROMETHAZINE HCL 25 MG/ML IJ SOLN: 6.2500 mg | INTRAMUSCULAR | Status: DC | PRN

## 2012-02-19 MED ORDER — BISACODYL 10 MG RE SUPP: 10.0000 mg | Freq: Every day | RECTAL | Status: DC | PRN

## 2012-02-19 MED ORDER — DIPHENHYDRAMINE HCL 12.5 MG/5ML PO ELIX: 12.5000 mg | ORAL_SOLUTION | ORAL | Status: DC | PRN

## 2012-02-19 MED ORDER — LIDOCAINE HCL (CARDIAC) 20 MG/ML IV SOLN
INTRAVENOUS | Status: DC | PRN
  Administered 2012-02-19: 75 mg via INTRAVENOUS

## 2012-02-19 MED ORDER — PHENOL 1.4 % MT LIQD
1.0000 | OROMUCOSAL | Status: DC | PRN
  Filled 2012-02-19: qty 177

## 2012-02-19 MED ORDER — ACETAMINOPHEN 10 MG/ML IV SOLN: 1000.0000 mg | Freq: Once | INTRAVENOUS | Status: DC | PRN

## 2012-02-19 MED ORDER — ONDANSETRON HCL 4 MG/2ML IJ SOLN: 4.0000 mg | Freq: Four times a day (QID) | INTRAMUSCULAR | Status: DC | PRN

## 2012-02-19 MED ORDER — DEXAMETHASONE 6 MG PO TABS
10.0000 mg | ORAL_TABLET | Freq: Once | ORAL | Status: AC
  Administered 2012-02-20: 10 mg via ORAL
  Filled 2012-02-19: qty 1

## 2012-02-19 MED ORDER — OXYCODONE HCL 5 MG PO TABS
5.0000 mg | ORAL_TABLET | ORAL | Status: DC | PRN
  Administered 2012-02-19: 5 mg via ORAL
  Administered 2012-02-20: 10 mg via ORAL
  Administered 2012-02-20 – 2012-02-21 (×6): 5 mg via ORAL
  Filled 2012-02-19: qty 1
  Filled 2012-02-19: qty 2
  Filled 2012-02-19 (×4): qty 1
  Filled 2012-02-19 (×2): qty 2
  Filled 2012-02-19: qty 1

## 2012-02-19 MED ORDER — LACTATED RINGERS IV SOLN
INTRAVENOUS | Status: DC
  Administered 2012-02-19: 15:00:00 via INTRAVENOUS
  Administered 2012-02-19: 1000 mL via INTRAVENOUS

## 2012-02-19 MED ORDER — CEFAZOLIN SODIUM 1-5 GM-% IV SOLN
1.0000 g | Freq: Four times a day (QID) | INTRAVENOUS | Status: AC
  Administered 2012-02-19 – 2012-02-20 (×2): 1 g via INTRAVENOUS
  Filled 2012-02-19 (×3): qty 50

## 2012-02-19 MED ORDER — METHOCARBAMOL 500 MG PO TABS
500.0000 mg | ORAL_TABLET | Freq: Four times a day (QID) | ORAL | Status: DC | PRN
  Administered 2012-02-20 – 2012-02-21 (×4): 500 mg via ORAL
  Filled 2012-02-19 (×5): qty 1

## 2012-02-19 MED ORDER — HYDROMORPHONE HCL PF 1 MG/ML IJ SOLN
0.2500 mg | INTRAMUSCULAR | Status: DC | PRN
  Administered 2012-02-19 (×3): 0.5 mg via INTRAVENOUS

## 2012-02-19 MED ORDER — ROCURONIUM BROMIDE 100 MG/10ML IV SOLN
INTRAVENOUS | Status: DC | PRN
  Administered 2012-02-19: 50 mg via INTRAVENOUS

## 2012-02-19 MED ORDER — BUPIVACAINE ON-Q PAIN PUMP (FOR ORDER SET NO CHG)
INJECTION | Status: DC
  Filled 2012-02-19: qty 1

## 2012-02-19 MED ORDER — DEXTROSE-NACL 5-0.9 % IV SOLN
INTRAVENOUS | Status: DC
  Administered 2012-02-20 – 2012-02-21 (×2): via INTRAVENOUS

## 2012-02-19 MED ORDER — RIVAROXABAN 10 MG PO TABS
10.0000 mg | ORAL_TABLET | Freq: Every day | ORAL | Status: DC
  Administered 2012-02-20 – 2012-02-21 (×2): 10 mg via ORAL
  Filled 2012-02-19 (×3): qty 1

## 2012-02-19 MED ORDER — BUPIVACAINE 0.25 % ON-Q PUMP SINGLE CATH 300ML
INJECTION | Status: DC | PRN
  Administered 2012-02-19: 300 mL

## 2012-02-19 MED ORDER — ACETAMINOPHEN 650 MG RE SUPP: 650.0000 mg | Freq: Four times a day (QID) | RECTAL | Status: DC | PRN

## 2012-02-19 MED ORDER — SODIUM CHLORIDE 0.9 % IV SOLN: INTRAVENOUS | Status: DC

## 2012-02-19 MED ORDER — POLYETHYLENE GLYCOL 3350 17 G PO PACK: 17.0000 g | PACK | Freq: Every day | ORAL | Status: DC | PRN

## 2012-02-19 MED ORDER — ACETAMINOPHEN 10 MG/ML IV SOLN
1000.0000 mg | Freq: Once | INTRAVENOUS | Status: AC
  Administered 2012-02-19: 1000 mg via INTRAVENOUS

## 2012-02-19 MED ORDER — HYDROMORPHONE HCL PF 1 MG/ML IJ SOLN
INTRAMUSCULAR | Status: AC
  Filled 2012-02-19: qty 2

## 2012-02-19 MED ORDER — NEOSTIGMINE METHYLSULFATE 1 MG/ML IJ SOLN
INTRAMUSCULAR | Status: DC | PRN
  Administered 2012-02-19: 3.5 mg via INTRAVENOUS

## 2012-02-19 MED ORDER — MENTHOL 3 MG MT LOZG
1.0000 | LOZENGE | OROMUCOSAL | Status: DC | PRN
  Filled 2012-02-19: qty 9

## 2012-02-19 MED ORDER — LACTATED RINGERS IV SOLN: INTRAVENOUS | Status: DC

## 2012-02-19 MED ORDER — METOCLOPRAMIDE HCL 5 MG/ML IJ SOLN: 5.0000 mg | Freq: Three times a day (TID) | INTRAMUSCULAR | Status: DC | PRN

## 2012-02-19 MED ORDER — GLYCOPYRROLATE 0.2 MG/ML IJ SOLN
INTRAMUSCULAR | Status: DC | PRN
  Administered 2012-02-19: .5 mg via INTRAVENOUS

## 2012-02-19 MED ORDER — PROPOFOL 10 MG/ML IV BOLUS
INTRAVENOUS | Status: DC | PRN
  Administered 2012-02-19: 180 mg via INTRAVENOUS

## 2012-02-19 MED ORDER — ONDANSETRON HCL 4 MG PO TABS: 4.0000 mg | ORAL_TABLET | Freq: Four times a day (QID) | ORAL | Status: DC | PRN

## 2012-02-19 MED ORDER — DEXAMETHASONE SODIUM PHOSPHATE 10 MG/ML IJ SOLN: 10.0000 mg | Freq: Once | INTRAMUSCULAR | Status: AC

## 2012-02-19 MED ORDER — BUPIVACAINE 0.25 % ON-Q PUMP SINGLE CATH 300ML
INJECTION | Status: AC
  Filled 2012-02-19: qty 300

## 2012-02-19 MED ORDER — FLEET ENEMA 7-19 GM/118ML RE ENEM: 1.0000 | ENEMA | Freq: Once | RECTAL | Status: AC | PRN

## 2012-02-19 SURGICAL SUPPLY — 73 items
ADAPTER BOLT FEMORAL +2/-2 (Knees) ×1 IMPLANT
ADPR FEM +2/-2 OFST BOLT (Knees) ×1 IMPLANT
ADPR FEM 5D STRL KN PFC SGM (Orthopedic Implant) ×1 IMPLANT
AUG FEM 4 4 STRL LF KN RT (Knees) ×2 IMPLANT
AUG TIB SZ4 5 REV STP WDG STRL (Knees) ×2 IMPLANT
AUGMENT POSTEERIOR PFC SZ4 RT (Knees) IMPLANT
BAG SPEC THK2 15X12 ZIP CLS (MISCELLANEOUS) ×1
BAG ZIPLOCK 12X15 (MISCELLANEOUS) ×2 IMPLANT
BANDAGE ELASTIC 6 VELCRO ST LF (GAUZE/BANDAGES/DRESSINGS) ×2 IMPLANT
BANDAGE ESMARK 6X9 LF (GAUZE/BANDAGES/DRESSINGS) ×1 IMPLANT
BLADE SAG 18X100X1.27 (BLADE) ×2 IMPLANT
BLADE SAW SGTL 11.0X1.19X90.0M (BLADE) ×2 IMPLANT
BNDG CMPR 9X6 STRL LF SNTH (GAUZE/BANDAGES/DRESSINGS) ×1
BNDG ESMARK 6X9 LF (GAUZE/BANDAGES/DRESSINGS) ×2
CATH KIT ON-Q SILVERSOAK 5 (CATHETERS) ×1 IMPLANT
CATH KIT ON-Q SILVERSOAK 5IN (CATHETERS) ×2 IMPLANT
CEMENT HV SMART SET (Cement) ×3 IMPLANT
CLOTH BEACON ORANGE TIMEOUT ST (SAFETY) ×2 IMPLANT
COMP FEM CEM RT SZ4 (Orthopedic Implant) ×2 IMPLANT
COMPONENT FEM CEM RT SZ4 (Orthopedic Implant) IMPLANT
CONT SPECI 4OZ STER CLIK (MISCELLANEOUS) ×1 IMPLANT
CUFF TOURN SGL QUICK 34 (TOURNIQUET CUFF) ×2
CUFF TRNQT CYL 34X4X40X1 (TOURNIQUET CUFF) ×1 IMPLANT
DRAPE EXTREMITY T 121X128X90 (DRAPE) ×2 IMPLANT
DRAPE LG THREE QUARTER DISP (DRAPES) ×1 IMPLANT
DRAPE POUCH INSTRU U-SHP 10X18 (DRAPES) ×2 IMPLANT
DRAPE U-SHAPE 47X51 STRL (DRAPES) ×2 IMPLANT
DRSG ADAPTIC 3X8 NADH LF (GAUZE/BANDAGES/DRESSINGS) ×2 IMPLANT
DRSG PAD ABDOMINAL 8X10 ST (GAUZE/BANDAGES/DRESSINGS) ×1 IMPLANT
DURAPREP 26ML APPLICATOR (WOUND CARE) ×2 IMPLANT
ELECT REM PT RETURN 9FT ADLT (ELECTROSURGICAL) ×2
ELECTRODE REM PT RTRN 9FT ADLT (ELECTROSURGICAL) ×1 IMPLANT
EVACUATOR 1/8 PVC DRAIN (DRAIN) ×2 IMPLANT
FACESHIELD LNG OPTICON STERILE (SAFETY) ×10 IMPLANT
FEMORAL ADAPTER (Orthopedic Implant) ×1 IMPLANT
GLOVE BIO SURGEON STRL SZ8 (GLOVE) ×2 IMPLANT
GLOVE BIOGEL PI IND STRL 8 (GLOVE) ×2 IMPLANT
GLOVE BIOGEL PI INDICATOR 8 (GLOVE) ×2
GLOVE ECLIPSE 8.0 STRL XLNG CF (GLOVE) ×2 IMPLANT
GOWN STRL NON-REIN LRG LVL3 (GOWN DISPOSABLE) ×3 IMPLANT
GOWN STRL REIN XL XLG (GOWN DISPOSABLE) ×3 IMPLANT
HANDPIECE INTERPULSE COAX TIP (DISPOSABLE) ×2
IMMOBILIZER KNEE 20 (SOFTGOODS) ×2
IMMOBILIZER KNEE 20 THIGH 36 (SOFTGOODS) ×1 IMPLANT
INSERT TC3 SZ4 4X17.5MM (Knees) ×1 IMPLANT
KIT BASIN OR (CUSTOM PROCEDURE TRAY) ×2 IMPLANT
MANIFOLD NEPTUNE II (INSTRUMENTS) ×2 IMPLANT
NS IRRIG 1000ML POUR BTL (IV SOLUTION) ×2 IMPLANT
PACK TOTAL JOINT (CUSTOM PROCEDURE TRAY) ×2 IMPLANT
PADDING CAST COTTON 6X4 STRL (CAST SUPPLIES) ×6 IMPLANT
PATELLA DOME PFC 38MM (Knees) ×1 IMPLANT
POSITIONER SURGICAL ARM (MISCELLANEOUS) ×2 IMPLANT
POSTERIOR AUGMENT PFC SZ4 RT (Knees) ×4 IMPLANT
RESTRICTOR CEMENT SZ 5 C-STEM (Cement) ×1 IMPLANT
SET HNDPC FAN SPRY TIP SCT (DISPOSABLE) ×1 IMPLANT
SPONGE GAUZE 4X4 12PLY (GAUZE/BANDAGES/DRESSINGS) ×2 IMPLANT
STAPLER VISISTAT 35W (STAPLE) ×2 IMPLANT
STEM TIBIA PFC 13X30MM (Stem) ×1 IMPLANT
STEM UNIVERSAL REVISION 75X18 (Stem) ×1 IMPLANT
SUCTION FRAZIER 12FR DISP (SUCTIONS) ×2 IMPLANT
SUT VIC AB 2-0 CT1 27 (SUTURE) ×6
SUT VIC AB 2-0 CT1 TAPERPNT 27 (SUTURE) ×3 IMPLANT
SUT VLOC 180 0 24IN GS25 (SUTURE) ×1 IMPLANT
SWAB COLLECTION DEVICE MRSA (MISCELLANEOUS) ×1 IMPLANT
TOWEL OR 17X26 10 PK STRL BLUE (TOWEL DISPOSABLE) ×4 IMPLANT
TOWER CARTRIDGE SMART MIX (DISPOSABLE) ×2 IMPLANT
TRAY FOLEY CATH 14FRSI W/METER (CATHETERS) ×2 IMPLANT
TRAY REVISION SZ 4 (Knees) ×1 IMPLANT
TRAY SLEEVE CEM ML (Knees) ×1 IMPLANT
TUBE ANAEROBIC SPECIMEN COL (MISCELLANEOUS) ×1 IMPLANT
WATER STERILE IRR 1500ML POUR (IV SOLUTION) ×2 IMPLANT
WEDGE SIZE 4 5MM (Knees) ×2 IMPLANT
WRAP KNEE MAXI GEL POST OP (GAUZE/BANDAGES/DRESSINGS) ×3 IMPLANT

## 2012-02-19 NOTE — Interval H&P Note (Signed)
History and Physical Interval Note:  02/19/2012 2:51 PM  David Joseph  has presented today for surgery, with the diagnosis of Unstable Right Total Knee Arthroplasty  The various methods of treatment have been discussed with the patient and family. After consideration of risks, benefits and other options for treatment, the patient has consented to  Procedure(s) (LRB) with comments: TOTAL KNEE REVISION (Right) as a surgical intervention .  The patient's history has been reviewed, patient examined, no change in status, stable for surgery.  I have reviewed the patient's chart and labs.  Questions were answered to the patient's satisfaction.     Loanne Drilling

## 2012-02-19 NOTE — Transfer of Care (Signed)
Immediate Anesthesia Transfer of Care Note  Patient: David Joseph  Procedure(s) Performed: Procedure(s) (LRB) with comments: TOTAL KNEE REVISION (Right)  Patient Location: PACU  Anesthesia Type:General  Level of Consciousness: awake, oriented and patient cooperative  Airway & Oxygen Therapy: Patient Spontanous Breathing and Patient connected to face mask oxygen  Post-op Assessment: Report given to PACU RN and Post -op Vital signs reviewed and stable  Post vital signs: Reviewed and stable  Complications: No apparent anesthesia complications

## 2012-02-19 NOTE — Anesthesia Preprocedure Evaluation (Addendum)
Anesthesia Evaluation  Patient identified by MRN, date of birth, ID band Patient awake    Reviewed: Allergy & Precautions, H&P , NPO status , Patient's Chart, lab work & pertinent test results  Airway Mallampati: II TM Distance: >3 FB Neck ROM: Full    Dental  (+) Dental Advisory Given and Teeth Intact   Pulmonary pneumonia -, resolved, former smoker,  breath sounds clear to auscultation  Pulmonary exam normal       Cardiovascular - CAD and - Past MI Rate:Normal     Neuro/Psych negative neurological ROS  negative psych ROS   GI/Hepatic negative GI ROS, Neg liver ROS,   Endo/Other  negative endocrine ROS  Renal/GU negative Renal ROS     Musculoskeletal negative musculoskeletal ROS (+)   Abdominal   Peds  Hematology negative hematology ROS (+)   Anesthesia Other Findings   Reproductive/Obstetrics                          Anesthesia Physical Anesthesia Plan  ASA: II  Anesthesia Plan: General   Post-op Pain Management:    Induction: Intravenous  Airway Management Planned: Oral ETT  Additional Equipment:   Intra-op Plan:   Post-operative Plan:   Informed Consent: I have reviewed the patients History and Physical, chart, labs and discussed the procedure including the risks, benefits and alternatives for the proposed anesthesia with the patient or authorized representative who has indicated his/her understanding and acceptance.   Dental advisory given  Plan Discussed with: CRNA  Anesthesia Plan Comments:        Anesthesia Quick Evaluation

## 2012-02-19 NOTE — Anesthesia Postprocedure Evaluation (Signed)
Anesthesia Post Note  Patient: David Joseph  Procedure(s) Performed: Procedure(s) (LRB): TOTAL KNEE REVISION (Right)  Anesthesia type: General  Patient location: PACU  Post pain: Pain level controlled  Post assessment: Post-op Vital signs reviewed  Last Vitals:  Filed Vitals:   02/19/12 1800  BP: 154/63  Pulse: 53  Temp:   Resp: 16    Post vital signs: Reviewed  Level of consciousness: sedated  Complications: No apparent anesthesia complications

## 2012-02-19 NOTE — Brief Op Note (Signed)
02/19/2012  4:40 PM  PATIENT:  Rosezetta Schlatter  71 y.o. male  PRE-OPERATIVE DIAGNOSIS:  Unstable Right Total Knee Arthroplasty  POST-OPERATIVE DIAGNOSIS:  Unstable Right Total Knee Arthroplasty  PROCEDURE:  Procedure(s) (LRB) with comments: TOTAL KNEE REVISION (Right)  SURGEON:  Surgeon(s) and Role:    * Loanne Drilling, MD - Primary  PHYSICIAN ASSISTANT:   ASSISTANTS: Leilani Able, PA-C   ANESTHESIA:   general  EBL:  Total I/O In: 1000 [I.V.:1000] Out: 375 [Urine:175; Blood:200]  BLOOD ADMINISTERED:none  DRAINS: (Medium) Hemovact drain(s) in the right knee with  Suction Open   LOCAL MEDICATIONS USED:  OTHER On-Q Marcaine pain pump  SPECIMEN:  No Specimen  DISPOSITION OF SPECIMEN:  N/A  COUNTS:  YES  TOURNIQUET:   Total Tourniquet Time Documented: Thigh (Right) - 57 minutes  DICTATION: .Other Dictation: Dictation Number [id number]  PLAN OF CARE: Admit to inpatient   PATIENT DISPOSITION:  PACU - hemodynamically stable.

## 2012-02-19 NOTE — H&P (View-Only) (Signed)
David Joseph  DOB: Aug 03, 1940 Married / Language: English / Race: White Male  Date of Admission:  02/19/2012  Chief Complaint:  Right Knee Pain and Instability  History of Present Illness The patient is a 71 year old male who comes in for a preoperative History and Physical. The patient is scheduled for a right total knee arthroplasty revision to be performed by Dr. Gus Joseph. Aluisio, MD at [facility] on 02/19/2012. The patient is a 71 year old male who presents for follow up of their knee. The patient is being followed for their right instability. Symptoms reported today include: pain, swelling and instability. Current treatment includes: bracing (helps him do more). The following medication has been used for pain control: none. [name] has an unstable right total knee arthroplasty. He has had recurrent effusions and knee pain. He had an infection work up previously that was negative. The swelling is not as bad. When he wears the brace he feels more stable but he still has the pain. He still has a lot of difficulty with stairs. His brace makes it a little easier on stairs but not to the point where he is doing them routinely. He is ready to get the knee revised. They have been treated conservatively in the past for the above stated problem and despite conservative measures, they continue to have progressive pain and severe functional limitations and dysfunction. They have failed non-operative management including home exercise, medications, and bracing. It is felt that they would benefit from undergoing revision right total joint replacement. Risks and benefits of the procedure have been discussed with the patient and they elect to proceed with surgery. There are no active contraindications to surgery such as ongoing infection or rapidly progressive neurological disease.   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 grandmother mothers side Heart Disease. mother Osteoarthritis. mother   Social History Exercise. Exercises rarely; does individual sport Illicit drug use. no Living situation. live with spouse Current work status. retired Financial planner (Currently). no Drug/Alcohol Rehab (Previously). no Marital status. married Tobacco use. never smoker Number of flights of stairs before winded. 4-5 Pain Contract. no Tobacco / smoke exposure. no Children. 2 Alcohol use. current drinker; drinks wine; 5-7 per week Post-Surgical Plans. Plan is to go home.   Past Surgical History Tonsillectomy Total Knee Replacement. Date: 06/07/2010. right Cataract Surgery. Date: 2010. bilateral Spinal Surgery. Date: 49. L5-S1 Finger Surgery   Medical History Osteoarthritis Skin Cancer Pneumonia Diverticulosis Degenerative Disc Disease Diverticulitis Of Colon. No recent flares in the past 10 years.   Review of Systems General:Not Present- Chills, Fever, Night Sweats, Fatigue, Weight Gain, Weight Loss and Memory Loss. Skin:Not Present- Hives, Itching, Rash, Eczema and Lesions. HEENT:Not Present- Tinnitus, Headache, Double Vision, Visual Loss, Hearing Loss and Dentures. Respiratory:Not Present- Shortness of breath with exertion, Shortness of breath at rest, Allergies, Coughing up blood and Chronic Cough. Cardiovascular:Not Present- Chest Pain, Racing/skipping heartbeats, Difficulty Breathing Lying Down, Murmur, Swelling and Palpitations. Gastrointestinal:Not Present- Bloody Stool, Heartburn, Abdominal Pain, Vomiting, Nausea, Constipation, Diarrhea, Difficulty Swallowing, Jaundice and Loss of appetitie. Male Genitourinary:Not Present- Urinary frequency, Blood in Urine, Weak urinary stream, Discharge, Flank Pain, Incontinence, Painful Urination, Urgency, Urinary Retention and Urinating at Night. Musculoskeletal:Present- Joint  Swelling, Joint Pain, Morning Stiffness and Spasms. Not Present- Muscle Weakness, Muscle Pain and Back Pain. Neurological:Not Present- Tremor, Dizziness, Blackout spells, Paralysis, Difficulty with balance and Weakness. Psychiatric:Not Present- Insomnia.  Vitals Weight: 180 lb Height: 69 in Body Surface Area: 1.99 m Body Mass Index: 26.58 kg/m Pulse: 56 (Regular) Resp.: 12 (Unlabored) BP: 136/72 (Sitting, Right Arm, Standard)    Physical Exam The physical exam findings are as follows:  Note: Patient is a 71 year old male with continued knee instability.   General Mental Status - Alert, cooperative and good historian. General Appearance- pleasant. Not in acute distress. Orientation- Oriented X3. Build & Nutrition- Well nourished and Well developed.   Head and Neck Head- normocephalic, atraumatic . Neck Global Assessment- supple. no bruit auscultated on the right and no bruit auscultated on the left.   Eye Pupil- Bilateral- Regular and Round. Motion- Bilateral- EOMI. wears glasses (readers)  Chest and Lung Exam Auscultation: Breath sounds:- clear at anterior chest wall and - clear at posterior chest wall. Adventitious sounds:- No Adventitious sounds.   Cardiovascular Auscultation:Rhythm- Regular rate and rhythm. Heart Sounds- S1 WNL and S2 WNL. Murmurs & Other Heart Sounds:Auscultation of the heart reveals - No Murmurs.   Abdomen Palpation/Percussion:Tenderness- Abdomen is non-tender to palpation. Rigidity (guarding)- Abdomen is soft. Auscultation:Auscultation of the abdomen reveals - Bowel sounds normal.   Male Genitourinary Not done, not pertinent to present illness  Musculoskeletal He is alert and oriented in no apparent distress. Both hips with normal range of motion, no discomfort. Left knee, normal exam. Right knee, trace effusion. Range of motion is about 5 to 110 degrees. He has fairly significant varus and  valgus laxity and extension and fairly significant AP laxity and flexion. He does have pain when stressing the knee, either varus/valgus or anterior/posterior. His pulses, sensation, and motor are intact distally.  RADIOGRAPHS: AP of both knees and lateral show the prosthesis in good alignment with no definitive periprosthetic abnormalities.  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 Lequita Halt.  Plan is to go home.  PCP - Dr. Selinda Flavin  Signed electronically by Roberts Gaudy, PA-C

## 2012-02-20 LAB — CBC
MCHC: 34.2 g/dL (ref 30.0–36.0)
Platelets: 137 10*3/uL — ABNORMAL LOW (ref 150–400)
RDW: 12.1 % (ref 11.5–15.5)

## 2012-02-20 LAB — BASIC METABOLIC PANEL
BUN: 11 mg/dL (ref 6–23)
CO2: 27 mEq/L (ref 19–32)
Calcium: 8.1 mg/dL — ABNORMAL LOW (ref 8.4–10.5)
Chloride: 100 mEq/L (ref 96–112)
Creatinine, Ser: 0.89 mg/dL (ref 0.50–1.35)
Glucose, Bld: 148 mg/dL — ABNORMAL HIGH (ref 70–99)

## 2012-02-20 NOTE — Progress Notes (Signed)
   Subjective: 1 Day Post-Op Procedure(s) (LRB): TOTAL KNEE REVISION (Right) Patient reports pain as mild and moderate.   Patient seen in rounds with Dr. Lequita Halt. Patient is well, and has had no acute complaints or problems We will start therapy today.  Plan is to go Home after hospital stay.  Objective: Vital signs in last 24 hours: Temp:  [98 F (36.7 C)-99.8 F (37.7 C)] 99.1 F (37.3 C) (12/05 0502) Pulse Rate:  [53-74] 66  (12/05 0502) Resp:  [8-20] 16  (12/05 0502) BP: (120-178)/(63-85) 133/68 mmHg (12/05 0502) SpO2:  [99 %-100 %] 99 % (12/05 0502) Weight:  [80.74 kg (178 lb)] 80.74 kg (178 lb) (12/04 1817)  Intake/Output from previous day:  Intake/Output Summary (Last 24 hours) at 02/20/12 0750 Last data filed at 02/20/12 0602  Gross per 24 hour  Intake   3515 ml  Output   2160 ml  Net   1355 ml    Intake/Output this shift: UOP 500 +1355  Labs:  [facility] 02/20/12 0430  HGB 10.4*    Basename 02/20/12 0430  WBC 6.3  RBC 3.35*  HCT 30.4*  PLT 137*    Basename 02/20/12 0430  NA 136  K 3.9  CL 100  CO2 27  BUN 11  CREATININE 0.89  GLUCOSE 148*  CALCIUM 8.1*   No results found for this basename: LABPT:2,INR:2 in the last 72 hours  EXAM General - Patient is Alert, Appropriate and Oriented Extremity - Neurovascular intact Sensation intact distally Dorsiflexion/Plantar flexion intact Dressing - dressing C/D/I Motor Function - intact, moving foot and toes well on exam.  Hemovac pulled without difficulty.  Past Medical History  Diagnosis Date  . Arthritis     OA  . Pneumonia 13 years ago  . Diverticulitis     10 years ago    Assessment/Plan: 1 Day Post-Op Procedure(s) (LRB): TOTAL KNEE REVISION (Right) Principal Problem:  *Instability of internal right knee prosthesis  Estimated Body mass index is 26.29 kg/(m^2) as calculated from the following:   Height as of this encounter: 5\' 9" [and 1/2[(1.753 m).   Weight as of this encounter: 178  lb(80.74 kg). Advance diet Up with therapy Plan for discharge tomorrow or Saturday  DVT Prophylaxis - Xarelto, 81 mg Aspirin on hold Weight-Bearing as tolerated to right leg No vaccines. D/C O2 and Pulse OX and try on Room Air  PERKINS, Marlowe Sax 02/20/2012, 7:50 AM

## 2012-02-20 NOTE — Progress Notes (Signed)
Physical Therapy Treatment Note   02/20/12 1400  PT Visit Information  Last PT Received On 02/20/12  Assistance Needed +1  PT Time Calculation  PT Start Time 1313  PT Stop Time 1342  PT Time Calculation (min) 29 min  Subjective Data  Subjective I was in the CPM and started to make my muscles spasm.  Precautions  Precautions Knee  Restrictions  Other Position/Activity Restrictions WBAT R LE  Cognition  Overall Cognitive Status Appears within functional limits for tasks assessed/performed  Bed Mobility  Bed Mobility Sit to Supine  Sit to Supine 4: Min assist  Details for Bed Mobility Assistance assist for R LE  Transfers  Transfers Stand to Sit;Sit to Stand  Sit to Stand 4: Min guard;With upper extremity assist;From chair/3-in-1  Stand to Sit 4: Min guard;With upper extremity assist;To bed  Details for Transfer Assistance verbal cues for safe technique  Ambulation/Gait  Ambulation/Gait Assistance 5: Supervision  Ambulation Distance (Feet) 160 Feet  Assistive device Rolling walker  Ambulation/Gait Assistance Details verbal cues for RW distance, step length  Gait Pattern Step-to pattern;Antalgic  Gait velocity decreased  Exercises  Exercises Total Joint  Total Joint Exercises  Ankle Circles/Pumps AROM;20 reps;Both  Quad Sets AROM;Both;20 reps  Gluteal Sets AROM;Both;20 reps  Towel Squeeze AROM;20 reps;Both  Short Arc Quad AROM;Right;15 reps  Heel Slides AAROM;Right;15 reps  Hip ABduction/ADduction AROM;15 reps;Right  Straight Leg Raises AAROM;10 reps;Right  Goniometric ROM R knee AAROM -10-60*  PT - End of Session  Equipment Utilized During Treatment Right knee immobilizer  Activity Tolerance Patient tolerated treatment well  Patient left in bed;with call bell/phone within reach  PT - Assessment/Plan  Comments on Treatment Session Pt ambulated in hallway and performed exercises.  Pt doing well and plans to d/c home tomorrow.  PT Plan Discharge plan remains  appropriate;Frequency remains appropriate  Follow Up Recommendations Home health PT  Equipment Recommended None recommended by PT  Acute Rehab PT Goals  PT Goal: Sit to Stand - Progress Progressing toward goal  PT Goal: Stand to Sit - Progress Progressing toward goal  PT Goal: Ambulate - Progress Progressing toward goal  PT Goal: Perform Home Exercise Program - Progress Progressing toward goal  PT General Charges  $$ ACUTE PT VISIT 1 Procedure  PT Treatments  $Gait Training 8-22 mins  $Therapeutic Exercise 8-22 mins    Zenovia Jarred, PT Pager: [phone]

## 2012-02-20 NOTE — Op Note (Signed)
NAMEJASIER, David Joseph NO.:  [id number]  MEDICAL RECORD NO.:  [id number]  LOCATION:  1612                         FACILITY:  [facility]  PHYSICIAN:  Ollen Gross, M.D.    DATE OF BIRTH:  07/12/1940  DATE OF PROCEDURE:  02/19/2012 DATE OF DISCHARGE:                              OPERATIVE REPORT   PREOPERATIVE DIAGNOSIS:  Unstable right total knee arthroplasty.  POSTOPERATIVE DIAGNOSIS:  Unstable right total knee arthroplasty.  PROCEDURE:  Right total knee arthroplasty revision.  SURGEON:  Ollen Gross, M.D.  ASSISTANT:  Jaquelyn Bitter. Chabon, PA-C  ANESTHESIA:  General.  ESTIMATED BLOOD LOSS:  Minimal.  DRAINS:  Hemovac x1.  One limb in the joint and one limb in the subcu tissue.  COMPLICATIONS:  None.  TOURNIQUET TIME:  Up 36 minutes at 300 mmHg, down 8 minutes, up additional 21 minutes at 300 mmHg.  COMPLICATIONS:  None.  CONDITION:  Stable to recovery.  BRIEF CLINICAL NOTE:  David Joseph is a 71 year old male who had a right total knee arthroplasty done few years ago, and he has had persistent pain and instability.  Infection workup has been negative, and he has grossly unstable knee on exam.  He presents now for right total knee arthroplasty revision.  PROCEDURE IN DETAIL:  After successful administration of general anesthetic, a tourniquet was placed high on the right thigh and right lower extremity, prepped and draped in usual sterile fashion.  Extremity was wrapped in Esmarch, knee flexed, tourniquet inflated to 300 mmHg.  A midline incision was made with 10-blade through subcutaneous tissue to the extensor mechanism.  A fresh blade was used make a medial parapatellar arthrotomy.  Soft tissue on the proximal medial tibia, subperiosteally elevated to the joint line with a knife and into the semimembranosus bursa with a Cobb elevator.  The soft tissue laterally was elevated with attention being paid to avoiding the patellar tendon on the tibial  tubercle.  Patella was everted, knee flexed 90 degrees. The tibia was then subluxed forward and completely dislocated from the femur.  The tibial polyethylene was removed.  We then placed retractors posteriorly, medially and laterally to isolate the tibia and protect the neurovascular structures.  I then disrupted the interface between the tibial component, bone and bone with an oscillating saw.  The tibial component was easily removed.  Cement was then removed from the tibial canal with osteotomes.  Extramedullary tibial alignment guide was placed, referencing proximally at the medial aspect of tibial tubercle and distally along the second metatarsal axis and tibial crest.  Block was pinned to remove about 3 mm anteriorly, which would skimmed the posterior about 1 mm.  He had a lot of slope in his initial cut.  The resection was made with an oscillating saw to gain a flat level surface with excellent cancellous bone.  Size 4 was the most appropriate tibial component.  The proximal tibia was prepared to modular drill and keel punch for the size 4.  I also prepared for a 29-mm sleeve for rotational control.  The femoral components were removed from the femur to disrupt the interface between the component and bone.  It was removed without any bone  loss.  We then drilled into the femoral canal and thoroughly irrigated the canal with saline.  The canal was then reamed up to 18 mm, which had an excellent press fit in the canal.  The reamer was left to serve as intramedullary cutting guide.  A 5-degree right valgus alignment guide was placed.  I placed it in a position to take 2 mm off the distal femur, we barely got into decent bone.  I went to 4 more mm and thus was going to use a 4-mm augment medial and lateral.  Size 4 was the most appropriate femoral component.  Size 4 cutting block was placed in a +2 position, effectively raising the stem and lowering the component to the anterior cortex  of the femur.  The rotation was marked off the epicondylar axis confirmed by pressing a spacer block in 90 degrees of flexion creating a rectangular flexion gap.  The anterior, posterior and chamfer cuts were made, did not need any further augments. The intercondylar block was placed and cuts made for the TC3.  Trial femur was placed, which was a size 4 TC3 with 4-mm medial and lateral augments and the stem 18 x 75 in the +2 position 5 degrees valgus.  On the tibial side, the component is a size 4 MBT revision tray with a 29 sleeve and 5-mm medial and lateral augments.  I placed the augments to get back up towards the joint line given that we had to resect a fair amount of bone to get a level surface.  This would prevent [place] from using an excessively thick insert.  With the trials in place, we got up to a 15-mm insert.  Full extension was achieved with excellent varus-valgus and anterior-posterior balance throughout full range of motion.  Patella was everted and soft tissue was removed that was overlying the patella. It was a mobile-bearing patella, which would not made with the TC3 femur.  Thus, we had to remove the metal-backed patellar component. This was removed with minimal of any bone loss.  Residual thickness was 15 mm, I cut down to 12 mm.  A 38 template was placed, lug holes were drilled, and the trial patella placed.  The patella tracks normally.  We then released the tourniquet for initial tourniquet time of 36 minutes. The tourniquet was held down for 8 minutes while the components were assembled on the back table.  After 8 minutes, I rewrapped the leg in Esmarch and reinflated the tourniquet to 300 mmHg.  All trials were removed.  We trialed for the cement restrictor, which was a size 5 and then the size 5 cement restrictor was placed at the appropriate depth in the tibial canal.  We copiously irrigated the cut bone surfaces and mixed three batches of gentamicin impregnated  cement.  The cement was then injected into the tibia and the tibial components cemented in place.  All extruded cements were removed.  On the femoral side, we cemented it distally, but had a press-fit stem.  The femoral component was impacted and extruded cement removed.  Trial 15 insert was placed. He had a tiny bit of plate 15, so went to 17.5, which still allowed for full extension with excellent varus-valgus and anterior-posterior balance throughout full range of motion.  The 38 patella was then cemented into place.  Patella was held with the clamp.  Once the cement was fully hardened the knee, permanent 17.5-mm TC3 rotating platform insert was placed in the tibial tray.  The wound was copiously irrigated with saline solution.  The arthrotomy was closed with over one limb of a Hemovac drain with running #1 V-Loc suture.  The tourniquet was then released for a second tourniquet time of 21 minutes.  The subcu was closed over a second limb of Hemovac drain with interrupted 2-0 Vicryl. Skin was then closed with staples.  The incision was then cleaned and dried and the catheter for Marcaine.  Pain pump was placed and pumps initiated.  The bulky sterile dressing was applied and he was placed into a knee immobilizer, awakened and transported to recovery in stable condition.  Please note that a surgical assistant was a medical necessity for this procedure in order to perform it in the safe and expeditious manner. Surgical assistant was absolutely necessary for retraction of vital neurovascular structures and for proper alignment of the limb while placing the prosthetic components and preparing the femur and tibia for the prosthetic components.     Ollen Gross, M.D.     FA/MEDQ  D:  02/19/2012  T:  02/20/2012  Job:  [id number]

## 2012-02-20 NOTE — Progress Notes (Signed)
Utilization review completed.  

## 2012-02-20 NOTE — Evaluation (Signed)
Physical Therapy Evaluation Patient Details Name: David Joseph MRN: [id number] DOB: 1940/10/14 Today's Date: 02/20/2012 Time: 4098-1191 PT Time Calculation (min): 23 min  PT Assessment / Plan / Recommendation Clinical Impression  Pt s/p R TKR revision.  Pt would benefit from acute PT services in order to improve independence with transfers, ambulation, and stairs to prepare for d/c home with spouse.    PT Assessment  Patient needs continued PT services    Follow Up Recommendations  Home health PT    Does the patient have the potential to tolerate intense rehabilitation      Barriers to Discharge        Equipment Recommendations  None recommended by PT    Recommendations for Other Services     Frequency 7X/week    Precautions / Restrictions Restrictions Weight Bearing Restrictions: Yes   Pertinent Vitals/Pain 2.5/10 R knee, repositioned, premedicated, ice applied      Mobility  Bed Mobility Bed Mobility: Supine to Sit Supine to Sit: 4: Min assist Details for Bed Mobility Assistance: assist for R LE, verbal cues for hand placement to assist Transfers Transfers: Stand to Sit;Sit to Stand Sit to Stand: 4: Min guard;With upper extremity assist;From bed Stand to Sit: 4: Min guard;With upper extremity assist;To chair/3-in-1 Details for Transfer Assistance: verbal cues for safe technique Ambulation/Gait Ambulation/Gait Assistance: 4: Min guard Ambulation Distance (Feet): 120 Feet Assistive device: Rolling walker Ambulation/Gait Assistance Details: verbal cues for sequence, RW distance, step length Gait Pattern: Step-to pattern;Antalgic Gait velocity: decreased    Shoulder Instructions     Exercises     PT Diagnosis: Difficulty walking;Acute pain  PT Problem List: Decreased strength;Decreased range of motion;Decreased mobility;Pain PT Treatment Interventions: DME instruction;Gait training;Stair training;Functional mobility training;Therapeutic  activities;Therapeutic exercise;Patient/family education   PT Goals Acute Rehab PT Goals PT Goal Formulation: With patient Time For Goal Achievement: 02/27/12 Potential to Achieve Goals: Good Pt will go Supine/Side to Sit: with modified independence PT Goal: Supine/Side to Sit - Progress: Goal set today Pt will go Sit to Stand: with modified independence PT Goal: Sit to Stand - Progress: Goal set today Pt will go Stand to Sit: with modified independence PT Goal: Stand to Sit - Progress: Goal set today Pt will Ambulate: 51 - 150 feet;with modified independence;with least restrictive assistive device PT Goal: Ambulate - Progress: Goal set today Pt will Go Up / Down Stairs: 3-5 stairs;with supervision;with least restrictive assistive device PT Goal: Up/Down Stairs - Progress: Goal set today Pt will Perform Home Exercise Program: with supervision, verbal cues required/provided PT Goal: Perform Home Exercise Program - Progress: Goal set today  Visit Information  Last PT Received On: 02/20/12 Assistance Needed: +1    Subjective Data  Subjective: This is my second time around.   Prior Functioning  Home Living Lives With: Spouse Type of Home: House Home Access: Stairs to enter Secretary/administrator of Steps: 5 Entrance Stairs-Rails: Right Home Layout: Two level Alternate Level Stairs-Number of Steps: 2 Alternate Level Stairs-Rails: None Home Adaptive Equipment: Bedside commode/3-in-1;Walker - rolling Prior Function Level of Independence: Independent Communication Communication: No difficulties    Cognition  Overall Cognitive Status: Appears within functional limits for tasks assessed/performed Arousal/Alertness: Awake/alert Orientation Level: Appears intact for tasks assessed Behavior During Session: [facility] for tasks performed    Extremity/Trunk Assessment Right Upper Extremity Assessment RUE ROM/Strength/Tone: [facility] for tasks assessed Left Upper Extremity Assessment LUE  ROM/Strength/Tone: WFL for tasks assessed Right Lower Extremity Assessment RLE ROM/Strength/Tone: Deficits RLE ROM/Strength/Tone Deficits: good quad  contraction, able to perform SLR however knee lag present Left Lower Extremity Assessment LLE ROM/Strength/Tone: [facility] for tasks assessed   Balance    End of Session PT - End of Session Equipment Utilized During Treatment: Right knee immobilizer Activity Tolerance: Patient tolerated treatment well Patient left: in chair;with call bell/phone within reach  GP     Tajay Muzzy,[name] 02/20/2012, 9:59 AM Pager: [phone]

## 2012-02-21 DIAGNOSIS — D62 Acute posthemorrhagic anemia: Secondary | ICD-10-CM

## 2012-02-21 LAB — BASIC METABOLIC PANEL
BUN: 16 mg/dL (ref 6–23)
CO2: 28 mEq/L (ref 19–32)
Chloride: 103 mEq/L (ref 96–112)
Creatinine, Ser: 0.88 mg/dL (ref 0.50–1.35)
GFR calc Af Amer: >90 mL/min (ref >90)
Potassium: 3.7 mEq/L (ref 3.5–5.1)

## 2012-02-21 LAB — CBC
HCT: 27.8 % — ABNORMAL LOW (ref 39.0–52.0)
MCV: 90.6 fL (ref 78.0–100.0)
Platelets: 118 10*3/uL — ABNORMAL LOW (ref 150–400)
RBC: 3.07 MIL/uL — ABNORMAL LOW (ref 4.22–5.81)
WBC: 8.1 10*3/uL (ref 4.0–10.5)

## 2012-02-21 MED ORDER — RIVAROXABAN 10 MG PO TABS: 10.0000 mg | ORAL_TABLET | Freq: Every day | ORAL | Status: DC

## 2012-02-21 MED ORDER — DIAZEPAM 5 MG PO TABS: 2.5000 mg | ORAL_TABLET | Freq: Four times a day (QID) | ORAL | Status: DC | PRN

## 2012-02-21 MED ORDER — OXYCODONE HCL 5 MG PO TABS: 5.0000 mg | ORAL_TABLET | ORAL | Status: DC | PRN

## 2012-02-21 NOTE — Progress Notes (Signed)
   Subjective: 2 Days Post-Op Procedure(s) (LRB): TOTAL KNEE REVISION (Right) Patient reports pain as mild.   Patient seen in rounds for Dr. Lequita Halt. Patient is well, and has had no acute complaints or problems Patient is ready to go home. He wants a home CPM again.  Objective: Vital signs in last 24 hours: Temp:  [98.4 F (36.9 C)-99.8 F (37.7 C)] 99.8 F (37.7 C) (12/06 0549) Pulse Rate:  [66-90] 82  (12/06 0549) Resp:  [16-18] 18  (12/06 0549) BP: (120-149)/(63-73) 131/63 mmHg (12/06 0549) SpO2:  [92 %-99 %] 98 % (12/06 0549)  Intake/Output from previous day:  Intake/Output Summary (Last 24 hours) at 02/21/12 0737 Last data filed at 02/21/12 0549  Gross per 24 hour  Intake    800 ml  Output   1900 ml  Net  -1100 ml    Intake/Output this shift:    Labs:  Basename 02/21/12 0428 02/20/12 0430  HGB 9.6* 10.4*    Basename 02/21/12 0428 02/20/12 0430  WBC 8.1 6.3  RBC 3.07* 3.35*  HCT 27.8* 30.4*  PLT 118* 137*    Basename 02/21/12 0428 02/20/12 0430  NA 137 136  K 3.7 3.9  CL 103 100  CO2 28 27  BUN 16 11  CREATININE 0.88 0.89  GLUCOSE 148* 148*  CALCIUM 8.6 8.1*   No results found for this basename: LABPT:2,INR:2 in the last 72 hours  EXAM: General - Patient is Alert, Appropriate and Oriented Extremity - Neurovascular intact Sensation intact distally Dorsiflexion/Plantar flexion intact No cellulitis present Incision - clean, dry, no drainage, healing Motor Function - intact, moving foot and toes well on exam.   Assessment/Plan: 2 Days Post-Op Procedure(s) (LRB): TOTAL KNEE REVISION (Right) Procedure(s) (LRB): TOTAL KNEE REVISION (Right) Past Medical History  Diagnosis Date  . Arthritis     OA  . Pneumonia 13 years ago  . Diverticulitis     10 years ago   Principal Problem:  *Instability of internal right knee prosthesis  Estimated Body mass index is 26.29 kg/(m^2) as calculated from the following:   Height as of this encounter: 5'  9"[and 1/2[(1.753 m).   Weight as of this encounter: 178 lb(80.74 kg). Up with therapy Discharge home with home health Diet - Regular diet Follow up - in 2 weeks Activity - WBAT Disposition - Home Condition Upon Discharge - Good D/C Meds - See DC Summary DVT Prophylaxis - Xarelto, 81 mg Aspirin on hold   David Joseph 02/21/2012, 7:37 AM

## 2012-02-21 NOTE — Progress Notes (Signed)
Physical Therapy Treatment Patient Details Name: David Joseph MRN: [id number] DOB: 08/15/1940 Today's Date: 02/21/2012 Time: 3086-5784 PT Time Calculation (min): 27 min  PT Assessment / Plan / Recommendation Comments on Treatment Session  Pt able to perform SLR 10x without lag.  Pt ambulated in hallway to stairs and practiced 4 steps.  Pt also performed exercises and given handout.  Pt ready for d/c home later today.    Follow Up Recommendations  Home health PT     Does the patient have the potential to tolerate intense rehabilitation     Barriers to Discharge        Equipment Recommendations  None recommended by PT    Recommendations for Other Services    Frequency     Plan Discharge plan remains appropriate;Frequency remains appropriate    Precautions / Restrictions Precautions Precautions: Knee Restrictions Other Position/Activity Restrictions: WBAT R LE   Pertinent Vitals/Pain 1/10 R knee pain, premedicated    Mobility  Bed Mobility Bed Mobility: Sit to Supine Supine to Sit: 5: Supervision Details for Bed Mobility Assistance: verbal cues for R LE support Transfers Transfers: Stand to Sit;Sit to Stand Sit to Stand: 5: Supervision;With upper extremity assist;From chair/3-in-1;From bed;From toilet Stand to Sit: 5: Supervision;To chair/3-in-1;To toilet Details for Transfer Assistance: pt able to recall safe technique except R LE forward Ambulation/Gait Ambulation/Gait Assistance: 5: Supervision Ambulation Distance (Feet): 200 Feet Assistive device: Rolling walker Ambulation/Gait Assistance Details: verbal cue for heel strike Gait Pattern: Step-to pattern;Antalgic Gait velocity: decreased Stairs: Yes Stairs Assistance: 4: Min guard Stairs Assistance Details (indicate cue type and reason): verbal cues for sequence and RW placement, pt reports stairs are somewhat familar since he performed them during previous surgery Stair Management Technique: Step to  pattern;Backwards;With walker Number of Stairs: 4     Exercises Total Joint Exercises Ankle Circles/Pumps: AROM;20 reps;Both Quad Sets: AROM;Both;20 reps Gluteal Sets: AROM;Both;20 reps Towel Squeeze: AROM;20 reps;Both Short Arc Quad: AROM;Right;15 reps Heel Slides: AAROM;Right;15 reps Hip ABduction/ADduction: AROM;15 reps;Right Straight Leg Raises: AROM;10 reps;Right   PT Diagnosis:    PT Problem List:   PT Treatment Interventions:     PT Goals Acute Rehab PT Goals PT Goal: Supine/Side to Sit - Progress: Progressing toward goal PT Goal: Stand to Sit - Progress: Progressing toward goal PT Goal: Ambulate - Progress: Progressing toward goal PT Goal: Up/Down Stairs - Progress: Progressing toward goal PT Goal: Perform Home Exercise Program - Progress: Progressing toward goal  Visit Information  Last PT Received On: 02/21/12 Assistance Needed: +1    Subjective Data  Subjective: I'm gonna get a CPM at home.   Cognition  Overall Cognitive Status: Appears within functional limits for tasks assessed/performed    Balance     End of Session PT - End of Session Activity Tolerance: Patient tolerated treatment well Patient left: in chair;with call bell/phone within reach   GP     Amarea Macdowell,[name] 02/21/2012, 11:00 AM Pager: [phone]

## 2012-02-21 NOTE — Care Management Note (Signed)
    Page 1 of 2   02/21/2012     11:29:09 AM   CARE MANAGEMENT NOTE 02/21/2012  Patient:  David Joseph, David Joseph   Account Number:  [id number]  Date Initiated:  02/21/2012  Documentation initiated by:  Colleen Can  Subjective/Objective Assessment:   dx unstable rt total arthroplasty; Revision rt total knee     Action/Plan:   CM spoke with patient. Plans are for patient to return to his home in [place] where spouse will be caregiver. He already has RW, #N!. He is requesting [facility] SPECIFIC PHYSICAL THERAPIST   Anticipated DC Date:  02/21/2012   Anticipated DC Plan:  HOME W HOME HEALTH SERVICES  In-house referral  NA      DC Planning Services  CM consult      [facility] Choice  HOME HEALTH  DURABLE MEDICAL EQUIPMENT   Choice offered to / List presented to:  C-1 Patient   DME arranged  CPM      DME agency  [facility]     HH arranged  HH-2 PT      [facility] agency  [facility]   Status of service:  Completed, signed off Medicare Important Message given?  NA - LOS <3 / Initial given by admissions (If response is "NO", the following Medicare IM given date fields will be blank) Date Medicare IM given:   Date Additional Medicare IM given:    Discharge Disposition:  HOME W HOME HEALTH SERVICES  Per UR Regulation:    If discussed at Long Length of Stay Meetings, dates discussed:    Comments:  02/21/2012 Damaris Schooner RN CCM [phone] [facility] rep notified of pt's request for services and specific physical therapist. Rep advised that [facility] could provide [facility] services with ststart date of 02/22/2012. CPM will be arranged by [facility]. Contact information given to patient.

## 2012-02-21 NOTE — Progress Notes (Signed)
Pt accidentally pulled out On Q pump while getting out of bed to the bathroom. Will continue to monitor pt.

## 2012-02-21 NOTE — Progress Notes (Signed)
OT Screen Order received, chart reviewed. Spoke briefly with pt who stated he has all necessary DME from previous knee sx and has prn A at home. Pt presents with no OT needs at this time. Will sign off.  Garrel Ridgel, OTR/L  Pager [phone] 02/21/2012

## 2012-02-21 NOTE — Discharge Summary (Signed)
Physician Discharge Summary   Patient ID: David Joseph MRN: [id number] DOB/AGE: 11/24/1940 71 y.o.  Admit date: 02/19/2012 Discharge date: 02/21/2012  Primary Diagnosis: Unstable right total knee arthroplasty.   Admission Diagnoses:  Past Medical History  Diagnosis Date  . Arthritis     OA  . Pneumonia 13 years ago  . Diverticulitis     10 years ago   Discharge Diagnoses:   Principal Problem:  *Instability of internal right knee prosthesis Active Problems:  Postop Acute blood loss anemia  Estimated Body mass index is 26.29 kg/(m^2) as calculated from the following:   Height as of this encounter: 5\' 9" [and 1/2[(1.753 m).   Weight as of this encounter: 178 lb(80.74 kg).  Classification of overweight in adults according to BMI (WHO, 1998)   Procedure:  Procedure(s) (LRB): TOTAL KNEE REVISION (Right)   Consults: None  HPI: David Joseph is a 71 year old male who had a right  total knee arthroplasty done few years ago, and he has had persistent  pain and instability. Infection workup has been negative, and he has  grossly unstable knee on exam. He presents now for right total knee  arthroplasty revision.  Laboratory Data: Admission on 02/19/2012  Component Date Value Range Status  . ABO/RH(D) 02/19/2012 A POS   Final  . Antibody Screen 02/19/2012 NEG   Final  . Sample Expiration 02/19/2012 02/22/2012   Final  . WBC 02/20/2012 6.3  4.0 - 10.5 K/uL Final  . RBC 02/20/2012 3.35* 4.22 - 5.81 MIL/uL Final  . Hemoglobin 02/20/2012 10.4* 13.0 - 17.0 g/dL Final  . HCT 40/98/1191 30.4* 39.0 - 52.0 % Final  . MCV 02/20/2012 90.7  78.0 - 100.0 fL Final  . MCH 02/20/2012 31.0  26.0 - 34.0 pg Final  . MCHC 02/20/2012 34.2  30.0 - 36.0 g/dL Final  . RDW 47/82/9562 12.1  11.5 - 15.5 % Final  . Platelets 02/20/2012 137* 150 - 400 K/uL Final  . Sodium 02/20/2012 136  135 - 145 mEq/L Final  . Potassium 02/20/2012 3.9  3.5 - 5.1 mEq/L Final  . Chloride 02/20/2012 100  96 - 112  mEq/L Final  . CO2 02/20/2012 27  19 - 32 mEq/L Final  . Glucose, Bld 02/20/2012 148* 70 - 99 mg/dL Final  . BUN 13/10/6576 11  6 - 23 mg/dL Final  . Creatinine, Ser 02/20/2012 0.89  0.50 - 1.35 mg/dL Final  . Calcium 46/96/2952 8.1* 8.4 - 10.5 mg/dL Final  . GFR calc non Af Amer 02/20/2012 85* >90 mL/min Final  . GFR calc Af Amer 02/20/2012 >90  >90 mL/min Final   Comment:                                 The eGFR has been calculated                          using the CKD EPI equation.                          This calculation has not been                          validated in all clinical  situations.                          eGFR's persistently                          <90 mL/min signify                          possible Chronic Kidney Disease.  . WBC 02/21/2012 8.1  4.0 - 10.5 K/uL Final  . RBC 02/21/2012 3.07* 4.22 - 5.81 MIL/uL Final  . Hemoglobin 02/21/2012 9.6* 13.0 - 17.0 g/dL Final  . HCT 29/56/2130 27.8* 39.0 - 52.0 % Final  . MCV 02/21/2012 90.6  78.0 - 100.0 fL Final  . MCH 02/21/2012 31.3  26.0 - 34.0 pg Final  . MCHC 02/21/2012 34.5  30.0 - 36.0 g/dL Final  . RDW 86/57/8469 12.1  11.5 - 15.5 % Final  . Platelets 02/21/2012 118* 150 - 400 K/uL Final   Comment: SPECIMEN CHECKED FOR CLOTS                          RESULT REPEATED AND VERIFIED                          PLATELET COUNT CONFIRMED BY SMEAR                          LARGE PLATELETS PRESENT  . Sodium 02/21/2012 137  135 - 145 mEq/L Final  . Potassium 02/21/2012 3.7  3.5 - 5.1 mEq/L Final  . Chloride 02/21/2012 103  96 - 112 mEq/L Final  . CO2 02/21/2012 28  19 - 32 mEq/L Final  . Glucose, Bld 02/21/2012 148* 70 - 99 mg/dL Final  . BUN 62/95/2841 16  6 - 23 mg/dL Final  . Creatinine, Ser 02/21/2012 0.88  0.50 - 1.35 mg/dL Final  . Calcium 32/44/0102 8.6  8.4 - 10.5 mg/dL Final  . GFR calc non Af Amer 02/21/2012 86* >90 mL/min Final  . GFR calc Af Amer 02/21/2012 >90  >90 mL/min Final    Comment:                                 The eGFR has been calculated                          using the CKD EPI equation.                          This calculation has not been                          validated in all clinical                          situations.                          eGFR's persistently                          <90 mL/min signify  possible Chronic Kidney Disease.  Hospital Outpatient Visit on 02/10/2012  Component Date Value Range Status  . aPTT 02/10/2012 28  24 - 37 seconds Final  . WBC 02/10/2012 3.0* 4.0 - 10.5 K/uL Final  . RBC 02/10/2012 4.35  4.22 - 5.81 MIL/uL Final  . Hemoglobin 02/10/2012 13.4  13.0 - 17.0 g/dL Final  . HCT 16/12/9602 39.4  39.0 - 52.0 % Final  . MCV 02/10/2012 90.6  78.0 - 100.0 fL Final  . MCH 02/10/2012 30.8  26.0 - 34.0 pg Final  . MCHC 02/10/2012 34.0  30.0 - 36.0 g/dL Final  . RDW 54/11/8117 12.0  11.5 - 15.5 % Final  . Platelets 02/10/2012 161  150 - 400 K/uL Final  . Sodium 02/10/2012 139  135 - 145 mEq/L Final  . Potassium 02/10/2012 4.4  3.5 - 5.1 mEq/L Final  . Chloride 02/10/2012 102  96 - 112 mEq/L Final  . CO2 02/10/2012 30  19 - 32 mEq/L Final  . Glucose, Bld 02/10/2012 97  70 - 99 mg/dL Final  . BUN 14/78/2956 15  6 - 23 mg/dL Final  . Creatinine, Ser 02/10/2012 0.92  0.50 - 1.35 mg/dL Final  . Calcium 21/30/8657 9.6  8.4 - 10.5 mg/dL Final  . Total Protein 02/10/2012 7.3  6.0 - 8.3 g/dL Final  . Albumin 84/69/6295 3.9  3.5 - 5.2 g/dL Final  . AST 28/41/3244 26  0 - 37 U/L Final  . ALT 02/10/2012 28  0 - 53 U/L Final  . Alkaline Phosphatase 02/10/2012 58  39 - 117 U/L Final  . Total Bilirubin 02/10/2012 0.8  0.3 - 1.2 mg/dL Final  . GFR calc non Af Amer 02/10/2012 84* >90 mL/min Final  . GFR calc Af Amer 02/10/2012 >90  >90 mL/min Final   Comment:                                 The eGFR has been calculated                          using the CKD EPI equation.                          This  calculation has not been                          validated in all clinical                          situations.                          eGFR's persistently                          <90 mL/min signify                          possible Chronic Kidney Disease.  Marland Kitchen Prothrombin Time 02/10/2012 12.4  11.6 - 15.2 seconds Final  . INR 02/10/2012 0.93  0.00 - 1.49 Final  . Color, Urine 02/10/2012 YELLOW  YELLOW Final  . APPearance 02/10/2012 CLEAR  CLEAR Final  . Specific Gravity, Urine 02/10/2012 1.020  1.005 - 1.030 Final  .  pH 02/10/2012 5.5  5.0 - 8.0 Final  . Glucose, UA 02/10/2012 NEGATIVE  NEGATIVE mg/dL Final  . Hgb urine dipstick 02/10/2012 NEGATIVE  NEGATIVE Final  . Bilirubin Urine 02/10/2012 NEGATIVE  NEGATIVE Final  . Ketones, ur 02/10/2012 NEGATIVE  NEGATIVE mg/dL Final  . Protein, ur 16/12/9602 NEGATIVE  NEGATIVE mg/dL Final  . Urobilinogen, UA 02/10/2012 0.2  0.0 - 1.0 mg/dL Final  . Nitrite 54/11/8117 NEGATIVE  NEGATIVE Final  . Leukocytes, UA 02/10/2012 NEGATIVE  NEGATIVE Final   MICROSCOPIC NOT DONE ON URINES WITH NEGATIVE PROTEIN, BLOOD, LEUKOCYTES, NITRITE, OR GLUCOSE <1000 mg/dL.  Marland Kitchen MRSA, PCR 02/10/2012 NEGATIVE  NEGATIVE Final  . Staphylococcus aureus 02/10/2012 NEGATIVE  NEGATIVE Final   Comment:                                 The Xpert SA Assay (FDA                          approved for NASAL specimens                          in patients over 78 years of age),                          is one component of                          a comprehensive surveillance                          program.  Test performance has                          been validated by [employer] for patients greater                          than or equal to 39 year old.                          It is not intended                          to diagnose infection nor to                          guide or monitor treatment.     X-Rays:No results found.  EKG:No orders  found for this or any previous visit.   Hospital Course: David Joseph is a 71 y.o. who was admitted to [facility]. They were brought to the operating room on 02/19/2012 and underwent Procedure(s): TOTAL KNEE REVISION.  Patient tolerated the procedure well and was later transferred to the recovery room and then to the orthopaedic floor for postoperative care.  They were given PO and IV analgesics for pain control following their surgery.  They were given 24 hours of postoperative antibiotics of  Anti-infectives     Start     Dose/Rate Route Frequency Ordered  Stop   02/19/12 2100   ceFAZolin (ANCEF) IVPB 1 g/50 mL premix        1 g 100 mL/hr over 30 Minutes Intravenous Every 6 hours 02/19/12 1924 02/20/12 0412   02/19/12 1146   ceFAZolin (ANCEF) IVPB 2 g/50 mL premix        2 g 100 mL/hr over 30 Minutes Intravenous 60 min pre-op 02/19/12 1146 02/19/12 1510         and started on DVT prophylaxis in the form of Xarelto.   PT and OT were ordered for total joint protocol.  Discharge planning consulted to help with postop disposition and equipment needs.  Patient had a decent night on the evening of surgery and started to get up OOB with therapy on day one and walked nearly 160 feet.  Hemovac drain was pulled without difficulty.  Continued to work with therapy into day two walking about 200 feet.  Dressing was changed on day two and the incision was healing well.  Patient was seen in rounds and was ready to go home later that same day.  Home CPM was also arranged for the patient.   Discharge Medications: Prior to Admission medications   Medication Sig Start Date End Date Taking? Authorizing Provider  psyllium (REGULOID) 0.52 G capsule Take 0.52 g by mouth daily.   Yes Historical Provider, MD  diazepam (VALIUM) 5 MG tablet Take 0.5-1 tablets (2.5-5 mg total) by mouth every 6 (six) hours as needed (as needed for spasms). 02/21/12   Alexzandrew Perkins, PA  oxyCODONE (OXY IR/ROXICODONE) 5  MG immediate release tablet Take 1-2 tablets (5-10 mg total) by mouth every 3 (three) hours as needed. 02/21/12   Alexzandrew Julien Girt, PA  rivaroxaban (XARELTO) 10 MG TABS tablet Take 1 tablet (10 mg total) by mouth daily with breakfast. Take Xarelto for two and a half more weeks, then discontinue Xarelto. Once the patient has completed the Xarelto, they may resume the 81 mg Aspirin. 02/21/12   Alexzandrew Julien Girt, PA    Diet: Regular diet Activity:WBAT Follow-up:in 2 weeks Disposition - Home Discharged Condition: good   Discharge Orders    Future Orders Please Complete By Expires   Diet general      Call MD / Call 911      Comments:   If you experience chest pain or shortness of breath, CALL 911 and be transported to the hospital emergency room.  If you develope a fever above 101 F, pus (white drainage) or increased drainage or redness at the wound, or calf pain, call your surgeon's office.   Discharge instructions      Comments:   Pick up stool softner and laxative for home. Do not submerge incision under water. May shower. Continue to use ice for pain and swelling from surgery.  Take Xarelto for two and a half more weeks, then discontinue Xarelto. Once the patient has completed the Xarelto, they may resume the 81 mg Aspirin.   Constipation Prevention      Comments:   Drink plenty of fluids.  Prune juice may be helpful.  You may use a stool softener, such as Colace (over the counter) 100 mg twice a day.  Use MiraLax (over the counter) for constipation as needed.   Increase activity slowly as tolerated      Patient may shower      Comments:   You may shower without a dressing once there is no drainage.  Do not wash over the wound.  If drainage  remains, do not shower until drainage stops.   Weight bearing as tolerated      Driving restrictions      Comments:   No driving until released by the physician.   Lifting restrictions      Comments:   No lifting until released by the  physician.   CPM      Comments:   Continuous passive motion machine (CPM):      Use the CPM for 6-8 hours per day.      You may increase by 5-10 degress every two to three days.  You may break it up into 2 or 3 sessions per day.      Use CPM for 3 weeks or until you are told to stop.   TED hose      Comments:   Use stockings (TED hose) for 3 weeks on both leg(s).  You may remove them at night for sleeping.   Change dressing      Comments:   Change dressing daily with sterile 4 x 4 inch gauze dressing and apply TED hose. Do not submerge the incision under water.   Do not put a pillow under the knee. Place it under the heel.      Do not sit on low chairs, stoools or toilet seats, as it may be difficult to get up from low surfaces          Medication List     As of 02/21/2012  8:33 AM    STOP taking these medications         aspirin EC 81 MG tablet      TAKE these medications         diazepam 5 MG tablet   Commonly known as: VALIUM   Take 0.5-1 tablets (2.5-5 mg total) by mouth every 6 (six) hours as needed (as needed for spasms).      oxyCODONE 5 MG immediate release tablet   Commonly known as: Oxy IR/ROXICODONE   Take 1-2 tablets (5-10 mg total) by mouth every 3 (three) hours as needed.      psyllium 0.52 G capsule   Commonly known as: REGULOID   Take 0.52 g by mouth daily.      rivaroxaban 10 MG Tabs tablet   Commonly known as: XARELTO   Take 1 tablet (10 mg total) by mouth daily with breakfast. Take Xarelto for two and a half more weeks, then discontinue Xarelto.  Once the patient has completed the Xarelto, they may resume the 81 mg Aspirin.           Follow-up Information    Follow up with Loanne Drilling, MD. Schedule an appointment as soon as possible for a visit in 2 weeks.   Contact information:   [address] [place] [address] [phone]          Signed: Patrica Duel 02/21/2012, 8:33  AM

## 2012-02-23 DIAGNOSIS — M159 Polyosteoarthritis, unspecified: Secondary | ICD-10-CM | POA: Diagnosis not present

## 2012-02-23 DIAGNOSIS — Z471 Aftercare following joint replacement surgery: Secondary | ICD-10-CM | POA: Diagnosis not present

## 2012-02-23 DIAGNOSIS — IMO0001 Reserved for inherently not codable concepts without codable children: Secondary | ICD-10-CM | POA: Diagnosis not present

## 2012-02-23 DIAGNOSIS — Z96659 Presence of unspecified artificial knee joint: Secondary | ICD-10-CM | POA: Diagnosis not present

## 2012-02-24 DIAGNOSIS — M159 Polyosteoarthritis, unspecified: Secondary | ICD-10-CM | POA: Diagnosis not present

## 2012-02-24 DIAGNOSIS — Z471 Aftercare following joint replacement surgery: Secondary | ICD-10-CM | POA: Diagnosis not present

## 2012-02-24 DIAGNOSIS — IMO0001 Reserved for inherently not codable concepts without codable children: Secondary | ICD-10-CM | POA: Diagnosis not present

## 2012-02-24 DIAGNOSIS — Z96659 Presence of unspecified artificial knee joint: Secondary | ICD-10-CM | POA: Diagnosis not present

## 2012-02-25 ENCOUNTER — Encounter (HOSPITAL_COMMUNITY): Payer: Self-pay | Admitting: Orthopedic Surgery

## 2012-02-25 DIAGNOSIS — M159 Polyosteoarthritis, unspecified: Secondary | ICD-10-CM | POA: Diagnosis not present

## 2012-02-25 DIAGNOSIS — Z96659 Presence of unspecified artificial knee joint: Secondary | ICD-10-CM | POA: Diagnosis not present

## 2012-02-25 DIAGNOSIS — J019 Acute sinusitis, unspecified: Secondary | ICD-10-CM | POA: Diagnosis not present

## 2012-02-25 DIAGNOSIS — Z471 Aftercare following joint replacement surgery: Secondary | ICD-10-CM | POA: Diagnosis not present

## 2012-02-25 DIAGNOSIS — IMO0001 Reserved for inherently not codable concepts without codable children: Secondary | ICD-10-CM | POA: Diagnosis not present

## 2012-02-26 DIAGNOSIS — M159 Polyosteoarthritis, unspecified: Secondary | ICD-10-CM | POA: Diagnosis not present

## 2012-02-26 DIAGNOSIS — IMO0001 Reserved for inherently not codable concepts without codable children: Secondary | ICD-10-CM | POA: Diagnosis not present

## 2012-02-26 DIAGNOSIS — Z96659 Presence of unspecified artificial knee joint: Secondary | ICD-10-CM | POA: Diagnosis not present

## 2012-02-26 DIAGNOSIS — Z471 Aftercare following joint replacement surgery: Secondary | ICD-10-CM | POA: Diagnosis not present

## 2012-02-27 DIAGNOSIS — M159 Polyosteoarthritis, unspecified: Secondary | ICD-10-CM | POA: Diagnosis not present

## 2012-02-27 DIAGNOSIS — IMO0001 Reserved for inherently not codable concepts without codable children: Secondary | ICD-10-CM | POA: Diagnosis not present

## 2012-02-27 DIAGNOSIS — Z96659 Presence of unspecified artificial knee joint: Secondary | ICD-10-CM | POA: Diagnosis not present

## 2012-02-27 DIAGNOSIS — Z471 Aftercare following joint replacement surgery: Secondary | ICD-10-CM | POA: Diagnosis not present

## 2012-03-02 DIAGNOSIS — M159 Polyosteoarthritis, unspecified: Secondary | ICD-10-CM | POA: Diagnosis not present

## 2012-03-02 DIAGNOSIS — IMO0001 Reserved for inherently not codable concepts without codable children: Secondary | ICD-10-CM | POA: Diagnosis not present

## 2012-03-02 DIAGNOSIS — Z471 Aftercare following joint replacement surgery: Secondary | ICD-10-CM | POA: Diagnosis not present

## 2012-03-02 DIAGNOSIS — Z96659 Presence of unspecified artificial knee joint: Secondary | ICD-10-CM | POA: Diagnosis not present

## 2012-03-04 DIAGNOSIS — M159 Polyosteoarthritis, unspecified: Secondary | ICD-10-CM | POA: Diagnosis not present

## 2012-03-04 DIAGNOSIS — Z96659 Presence of unspecified artificial knee joint: Secondary | ICD-10-CM | POA: Diagnosis not present

## 2012-03-04 DIAGNOSIS — IMO0001 Reserved for inherently not codable concepts without codable children: Secondary | ICD-10-CM | POA: Diagnosis not present

## 2012-03-04 DIAGNOSIS — Z471 Aftercare following joint replacement surgery: Secondary | ICD-10-CM | POA: Diagnosis not present

## 2012-03-05 DIAGNOSIS — Z96659 Presence of unspecified artificial knee joint: Secondary | ICD-10-CM | POA: Diagnosis not present

## 2012-03-05 DIAGNOSIS — IMO0001 Reserved for inherently not codable concepts without codable children: Secondary | ICD-10-CM | POA: Diagnosis not present

## 2012-03-05 DIAGNOSIS — Z471 Aftercare following joint replacement surgery: Secondary | ICD-10-CM | POA: Diagnosis not present

## 2012-03-05 DIAGNOSIS — M159 Polyosteoarthritis, unspecified: Secondary | ICD-10-CM | POA: Diagnosis not present

## 2012-03-06 DIAGNOSIS — M1710 Unilateral primary osteoarthritis, unspecified knee: Secondary | ICD-10-CM | POA: Diagnosis not present

## 2012-03-06 DIAGNOSIS — R262 Difficulty in walking, not elsewhere classified: Secondary | ICD-10-CM | POA: Diagnosis not present

## 2012-03-09 DIAGNOSIS — R262 Difficulty in walking, not elsewhere classified: Secondary | ICD-10-CM | POA: Diagnosis not present

## 2012-03-09 DIAGNOSIS — M1710 Unilateral primary osteoarthritis, unspecified knee: Secondary | ICD-10-CM | POA: Diagnosis not present

## 2012-03-10 DIAGNOSIS — R262 Difficulty in walking, not elsewhere classified: Secondary | ICD-10-CM | POA: Diagnosis not present

## 2012-03-10 DIAGNOSIS — M1710 Unilateral primary osteoarthritis, unspecified knee: Secondary | ICD-10-CM | POA: Diagnosis not present

## 2012-03-12 DIAGNOSIS — R262 Difficulty in walking, not elsewhere classified: Secondary | ICD-10-CM | POA: Diagnosis not present

## 2012-03-12 DIAGNOSIS — M1710 Unilateral primary osteoarthritis, unspecified knee: Secondary | ICD-10-CM | POA: Diagnosis not present

## 2012-03-16 DIAGNOSIS — R262 Difficulty in walking, not elsewhere classified: Secondary | ICD-10-CM | POA: Diagnosis not present

## 2012-03-16 DIAGNOSIS — M1710 Unilateral primary osteoarthritis, unspecified knee: Secondary | ICD-10-CM | POA: Diagnosis not present

## 2012-03-19 DIAGNOSIS — R262 Difficulty in walking, not elsewhere classified: Secondary | ICD-10-CM | POA: Diagnosis not present

## 2012-03-19 DIAGNOSIS — M1710 Unilateral primary osteoarthritis, unspecified knee: Secondary | ICD-10-CM | POA: Diagnosis not present

## 2012-03-24 DIAGNOSIS — Z23 Encounter for immunization: Secondary | ICD-10-CM | POA: Diagnosis not present

## 2012-03-25 DIAGNOSIS — Z96649 Presence of unspecified artificial hip joint: Secondary | ICD-10-CM | POA: Diagnosis not present

## 2012-06-01 DIAGNOSIS — L570 Actinic keratosis: Secondary | ICD-10-CM | POA: Diagnosis not present

## 2012-06-24 IMAGING — CR DG CHEST 2V
2 series · 2 of 2 positions shown · non-contrast
Comparison: None.

CLINICAL DATA: Preop for new right knee replacement

CHEST - 2 VIEW

[w chest pa]
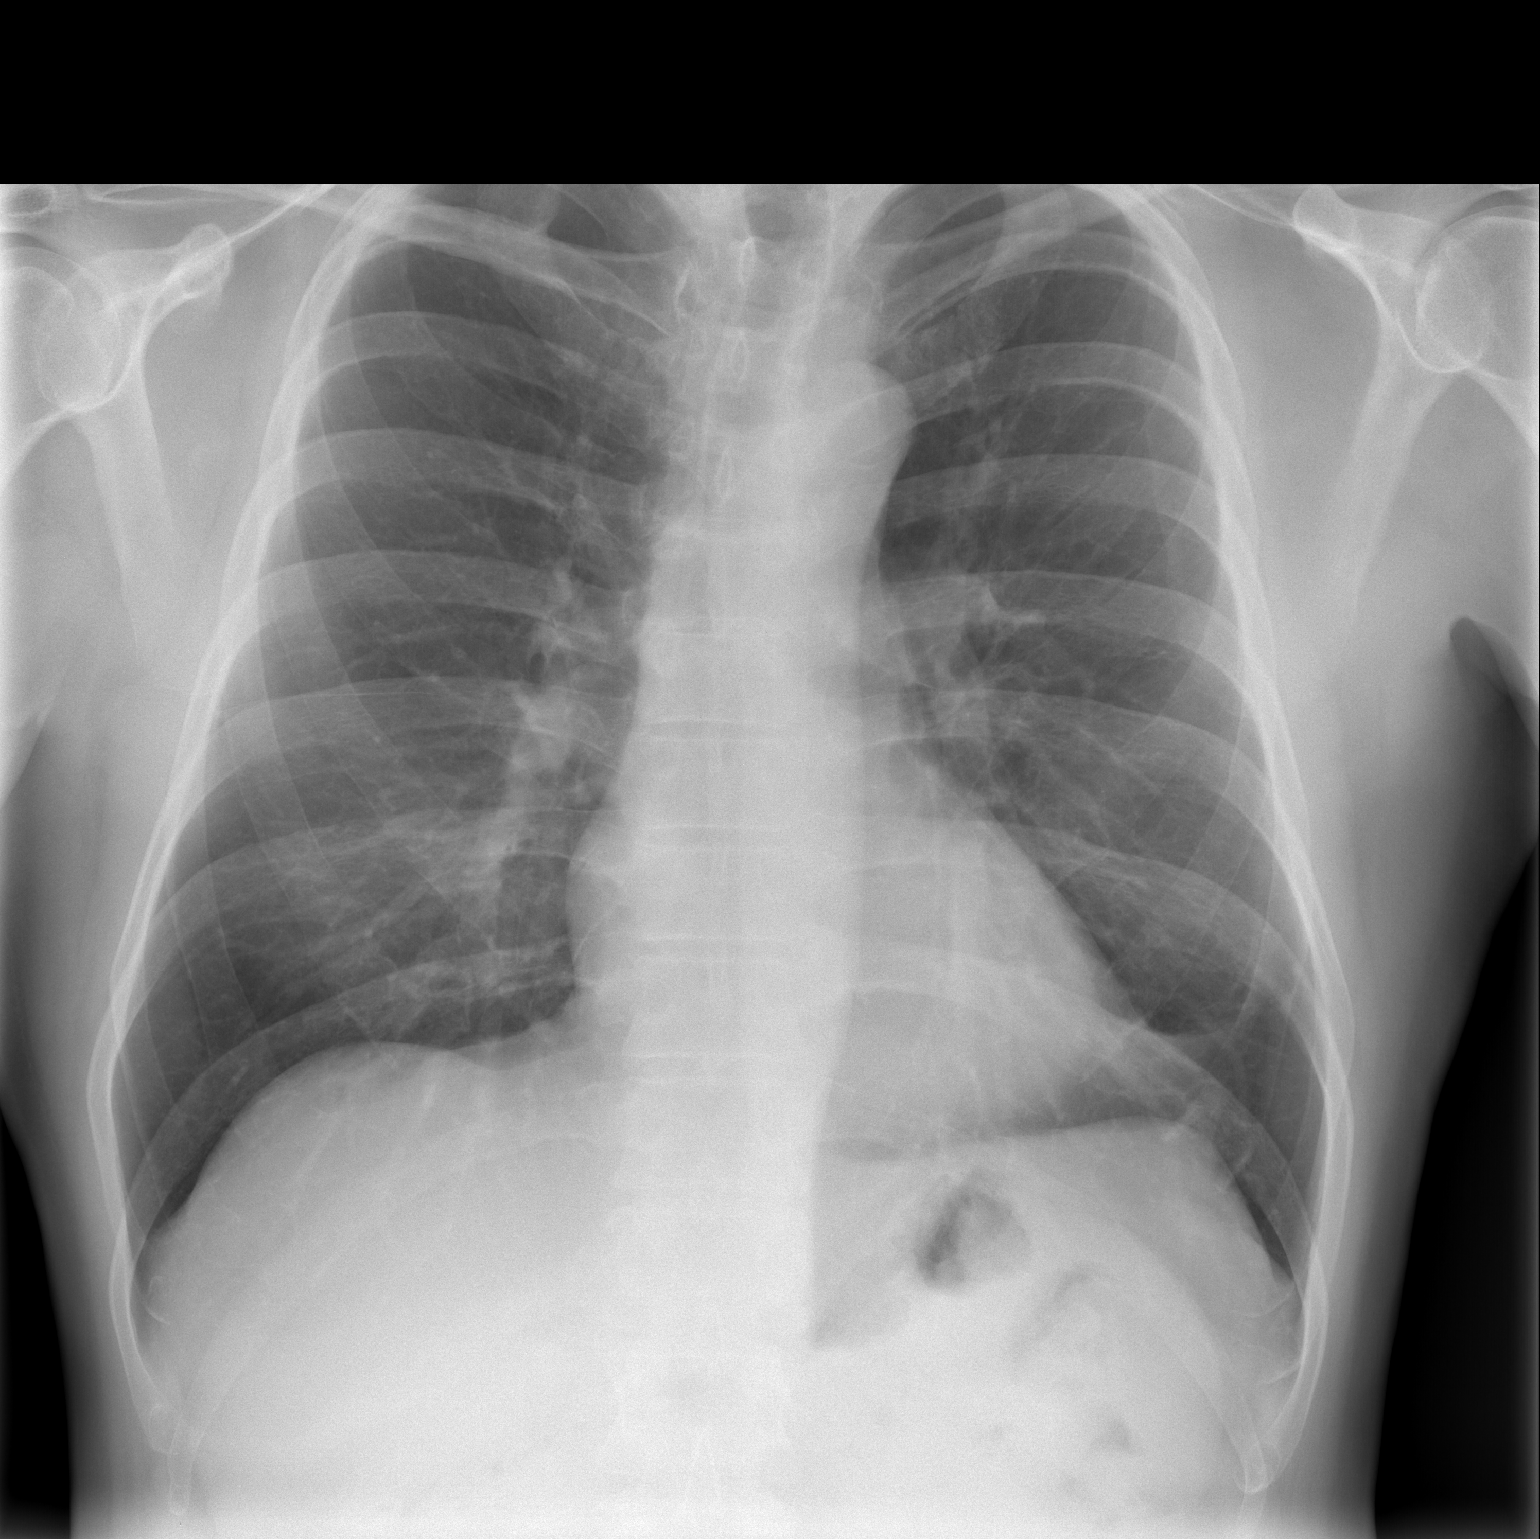

[w chest lat]
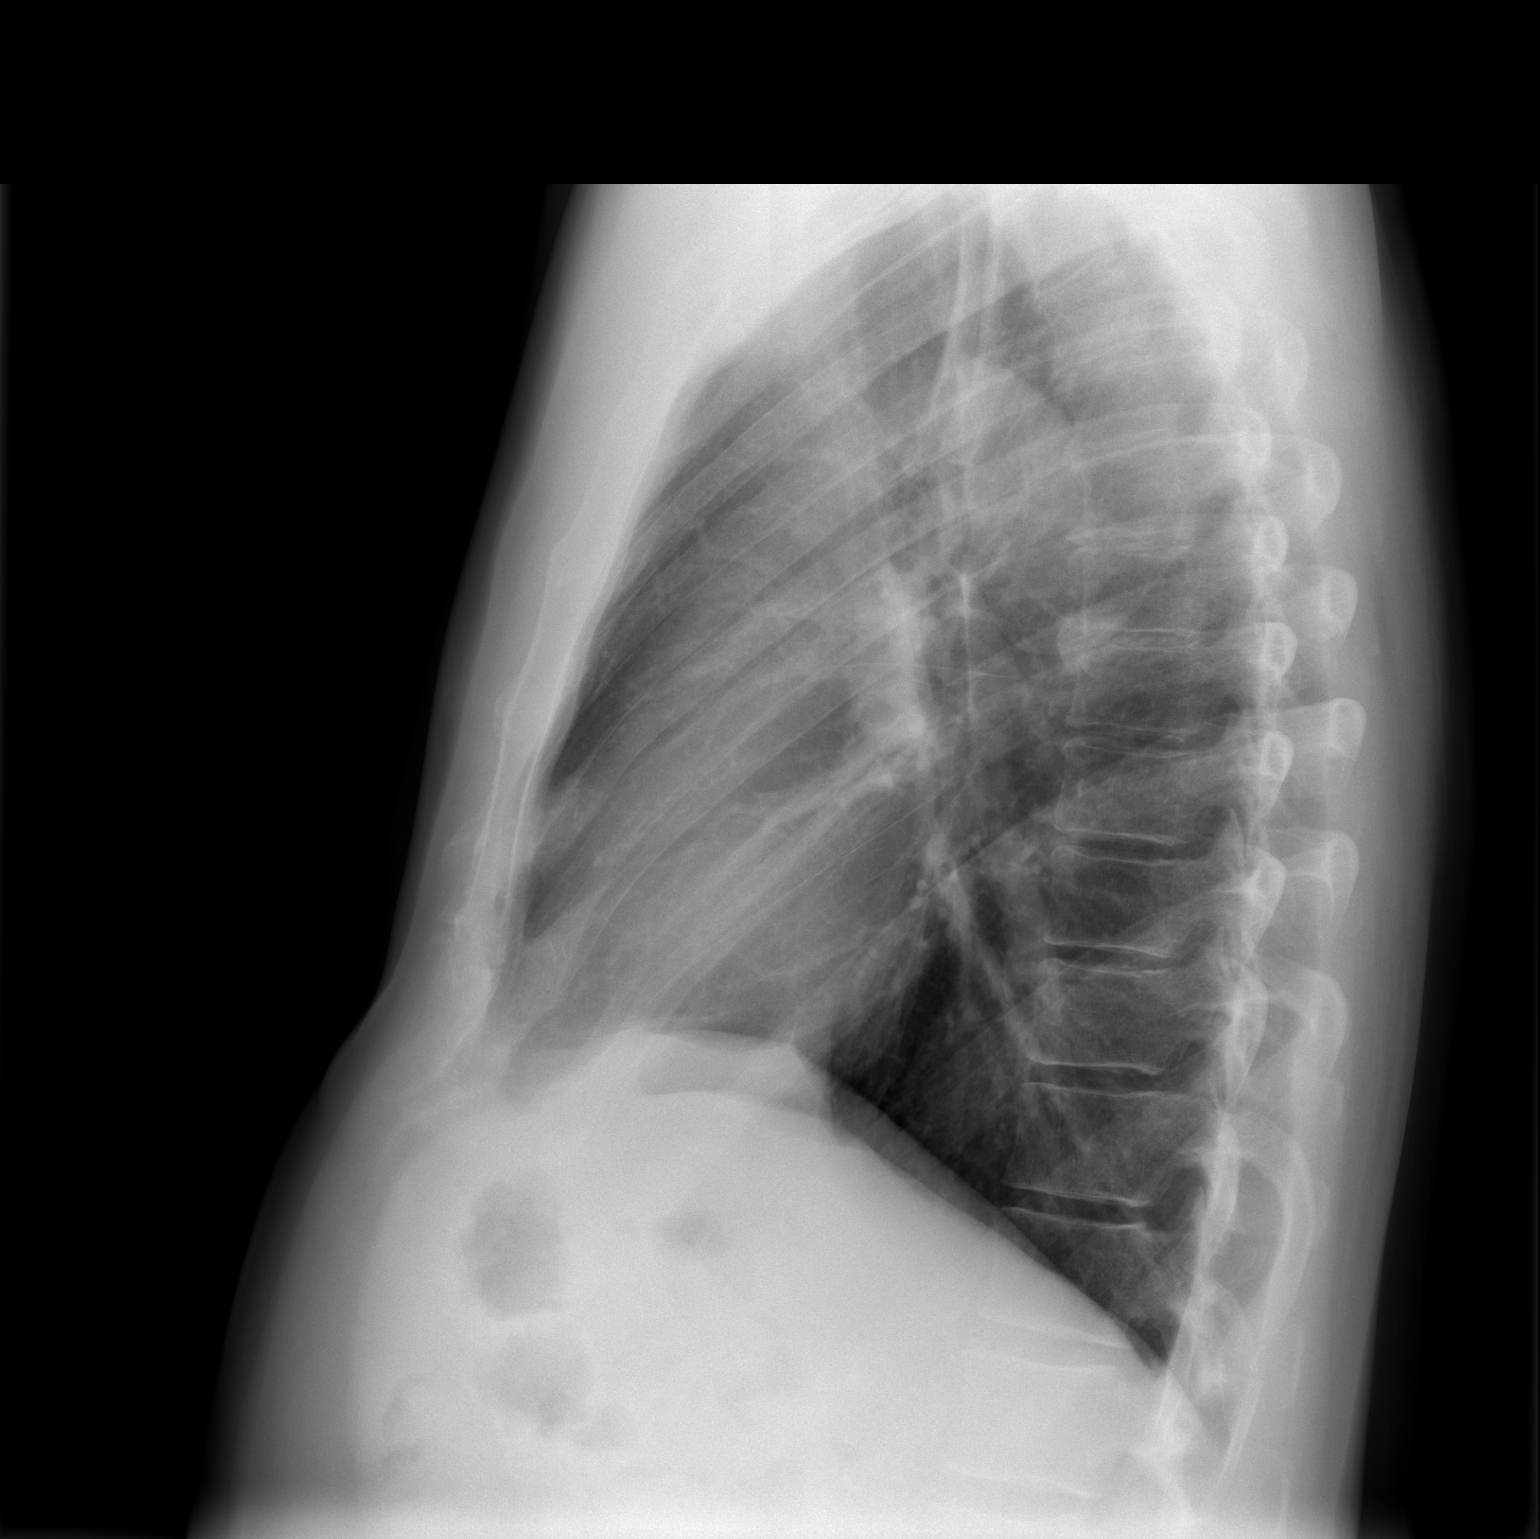

[2 of 2 positions shown; findings below may reference images not displayed]

FINDINGS: Heart and mediastinal contours normal.  Lungs clear.  No
pleural fluid.  Osseous structures and soft tissues unremarkable.
IMPRESSION: No acute or significant findings.

## 2012-07-28 DIAGNOSIS — R7309 Other abnormal glucose: Secondary | ICD-10-CM | POA: Diagnosis not present

## 2012-07-28 DIAGNOSIS — Z00129 Encounter for routine child health examination without abnormal findings: Secondary | ICD-10-CM | POA: Diagnosis not present

## 2012-07-28 DIAGNOSIS — E7800 Pure hypercholesterolemia, unspecified: Secondary | ICD-10-CM | POA: Diagnosis not present

## 2012-07-28 DIAGNOSIS — I10 Essential (primary) hypertension: Secondary | ICD-10-CM | POA: Diagnosis not present

## 2012-08-04 DIAGNOSIS — E7800 Pure hypercholesterolemia, unspecified: Secondary | ICD-10-CM | POA: Diagnosis not present

## 2012-08-04 DIAGNOSIS — Z0000 Encounter for general adult medical examination without abnormal findings: Secondary | ICD-10-CM | POA: Diagnosis not present

## 2012-08-04 DIAGNOSIS — Z23 Encounter for immunization: Secondary | ICD-10-CM | POA: Diagnosis not present

## 2012-08-27 DIAGNOSIS — Z96659 Presence of unspecified artificial knee joint: Secondary | ICD-10-CM | POA: Diagnosis not present

## 2012-09-30 DIAGNOSIS — R071 Chest pain on breathing: Secondary | ICD-10-CM | POA: Diagnosis not present

## 2012-11-24 DIAGNOSIS — Z85828 Personal history of other malignant neoplasm of skin: Secondary | ICD-10-CM | POA: Diagnosis not present

## 2012-11-24 DIAGNOSIS — L570 Actinic keratosis: Secondary | ICD-10-CM | POA: Diagnosis not present

## 2012-11-24 DIAGNOSIS — D485 Neoplasm of uncertain behavior of skin: Secondary | ICD-10-CM | POA: Diagnosis not present

## 2013-01-18 DIAGNOSIS — K5732 Diverticulitis of large intestine without perforation or abscess without bleeding: Secondary | ICD-10-CM | POA: Diagnosis not present

## 2013-05-24 DIAGNOSIS — L570 Actinic keratosis: Secondary | ICD-10-CM | POA: Diagnosis not present

## 2013-05-24 DIAGNOSIS — L219 Seborrheic dermatitis, unspecified: Secondary | ICD-10-CM | POA: Diagnosis not present

## 2013-05-24 DIAGNOSIS — Z85828 Personal history of other malignant neoplasm of skin: Secondary | ICD-10-CM | POA: Diagnosis not present

## 2013-08-07 DIAGNOSIS — T148 Other injury of unspecified body region: Secondary | ICD-10-CM | POA: Diagnosis not present

## 2013-08-07 DIAGNOSIS — M25559 Pain in unspecified hip: Secondary | ICD-10-CM | POA: Diagnosis not present

## 2013-08-07 DIAGNOSIS — W57XXXA Bitten or stung by nonvenomous insect and other nonvenomous arthropods, initial encounter: Secondary | ICD-10-CM | POA: Diagnosis not present

## 2013-08-11 DIAGNOSIS — D509 Iron deficiency anemia, unspecified: Secondary | ICD-10-CM | POA: Diagnosis not present

## 2013-08-11 DIAGNOSIS — R7309 Other abnormal glucose: Secondary | ICD-10-CM | POA: Diagnosis not present

## 2013-08-11 DIAGNOSIS — N320 Bladder-neck obstruction: Secondary | ICD-10-CM | POA: Diagnosis not present

## 2013-08-11 DIAGNOSIS — E538 Deficiency of other specified B group vitamins: Secondary | ICD-10-CM | POA: Diagnosis not present

## 2013-08-11 DIAGNOSIS — I10 Essential (primary) hypertension: Secondary | ICD-10-CM | POA: Diagnosis not present

## 2013-08-11 DIAGNOSIS — E7800 Pure hypercholesterolemia, unspecified: Secondary | ICD-10-CM | POA: Diagnosis not present

## 2013-08-11 DIAGNOSIS — R5382 Chronic fatigue, unspecified: Secondary | ICD-10-CM | POA: Diagnosis not present

## 2013-08-11 DIAGNOSIS — G9332 Myalgic encephalomyelitis/chronic fatigue syndrome: Secondary | ICD-10-CM | POA: Diagnosis not present

## 2013-08-16 DIAGNOSIS — D6489 Other specified anemias: Secondary | ICD-10-CM | POA: Diagnosis not present

## 2013-08-16 DIAGNOSIS — Z0000 Encounter for general adult medical examination without abnormal findings: Secondary | ICD-10-CM | POA: Diagnosis not present

## 2013-08-23 DIAGNOSIS — R39198 Other difficulties with micturition: Secondary | ICD-10-CM | POA: Diagnosis not present

## 2013-08-23 DIAGNOSIS — R3915 Urgency of urination: Secondary | ICD-10-CM | POA: Diagnosis not present

## 2013-08-23 DIAGNOSIS — D709 Neutropenia, unspecified: Secondary | ICD-10-CM | POA: Diagnosis not present

## 2013-08-23 DIAGNOSIS — D759 Disease of blood and blood-forming organs, unspecified: Secondary | ICD-10-CM | POA: Diagnosis not present

## 2013-08-23 DIAGNOSIS — F17200 Nicotine dependence, unspecified, uncomplicated: Secondary | ICD-10-CM | POA: Diagnosis not present

## 2013-08-23 DIAGNOSIS — R972 Elevated prostate specific antigen [PSA]: Secondary | ICD-10-CM | POA: Diagnosis not present

## 2013-08-23 DIAGNOSIS — D61818 Other pancytopenia: Secondary | ICD-10-CM | POA: Diagnosis not present

## 2013-08-23 DIAGNOSIS — D649 Anemia, unspecified: Secondary | ICD-10-CM | POA: Diagnosis not present

## 2013-08-24 DIAGNOSIS — C44319 Basal cell carcinoma of skin of other parts of face: Secondary | ICD-10-CM | POA: Diagnosis not present

## 2013-08-24 DIAGNOSIS — L570 Actinic keratosis: Secondary | ICD-10-CM | POA: Diagnosis not present

## 2013-08-24 DIAGNOSIS — Z85828 Personal history of other malignant neoplasm of skin: Secondary | ICD-10-CM | POA: Diagnosis not present

## 2013-08-24 DIAGNOSIS — D485 Neoplasm of uncertain behavior of skin: Secondary | ICD-10-CM | POA: Diagnosis not present

## 2013-09-01 DIAGNOSIS — H26499 Other secondary cataract, unspecified eye: Secondary | ICD-10-CM | POA: Diagnosis not present

## 2013-09-13 DIAGNOSIS — Z96659 Presence of unspecified artificial knee joint: Secondary | ICD-10-CM | POA: Diagnosis not present

## 2013-09-13 DIAGNOSIS — M1710 Unilateral primary osteoarthritis, unspecified knee: Secondary | ICD-10-CM | POA: Diagnosis not present

## 2013-09-13 DIAGNOSIS — M2180 Other specified acquired deformities of unspecified limb: Secondary | ICD-10-CM | POA: Diagnosis not present

## 2013-09-13 DIAGNOSIS — IMO0002 Reserved for concepts with insufficient information to code with codable children: Secondary | ICD-10-CM | POA: Diagnosis not present

## 2013-09-13 DIAGNOSIS — M25569 Pain in unspecified knee: Secondary | ICD-10-CM | POA: Diagnosis not present

## 2013-09-15 DIAGNOSIS — R972 Elevated prostate specific antigen [PSA]: Secondary | ICD-10-CM | POA: Diagnosis not present

## 2013-09-16 DIAGNOSIS — C44319 Basal cell carcinoma of skin of other parts of face: Secondary | ICD-10-CM | POA: Diagnosis not present

## 2013-09-22 DIAGNOSIS — R972 Elevated prostate specific antigen [PSA]: Secondary | ICD-10-CM | POA: Diagnosis not present

## 2013-09-22 DIAGNOSIS — N529 Male erectile dysfunction, unspecified: Secondary | ICD-10-CM | POA: Diagnosis not present

## 2013-09-22 DIAGNOSIS — F17200 Nicotine dependence, unspecified, uncomplicated: Secondary | ICD-10-CM | POA: Diagnosis not present

## 2013-10-06 DIAGNOSIS — R972 Elevated prostate specific antigen [PSA]: Secondary | ICD-10-CM | POA: Diagnosis not present

## 2013-10-06 DIAGNOSIS — N529 Male erectile dysfunction, unspecified: Secondary | ICD-10-CM | POA: Diagnosis not present

## 2013-10-06 DIAGNOSIS — C61 Malignant neoplasm of prostate: Secondary | ICD-10-CM | POA: Diagnosis not present

## 2013-10-16 DIAGNOSIS — N410 Acute prostatitis: Secondary | ICD-10-CM | POA: Diagnosis not present

## 2013-10-16 DIAGNOSIS — R972 Elevated prostate specific antigen [PSA]: Secondary | ICD-10-CM | POA: Diagnosis not present

## 2013-10-21 DIAGNOSIS — Z96659 Presence of unspecified artificial knee joint: Secondary | ICD-10-CM | POA: Diagnosis not present

## 2013-10-21 DIAGNOSIS — M2180 Other specified acquired deformities of unspecified limb: Secondary | ICD-10-CM | POA: Diagnosis not present

## 2013-10-21 DIAGNOSIS — M25569 Pain in unspecified knee: Secondary | ICD-10-CM | POA: Diagnosis not present

## 2013-10-21 DIAGNOSIS — IMO0002 Reserved for concepts with insufficient information to code with codable children: Secondary | ICD-10-CM | POA: Diagnosis not present

## 2013-10-21 DIAGNOSIS — M1710 Unilateral primary osteoarthritis, unspecified knee: Secondary | ICD-10-CM | POA: Diagnosis not present

## 2013-10-22 DIAGNOSIS — N529 Male erectile dysfunction, unspecified: Secondary | ICD-10-CM | POA: Diagnosis not present

## 2013-10-22 DIAGNOSIS — R972 Elevated prostate specific antigen [PSA]: Secondary | ICD-10-CM | POA: Diagnosis not present

## 2013-10-22 DIAGNOSIS — C61 Malignant neoplasm of prostate: Secondary | ICD-10-CM | POA: Diagnosis not present

## 2013-10-27 DIAGNOSIS — C61 Malignant neoplasm of prostate: Secondary | ICD-10-CM | POA: Diagnosis not present

## 2013-10-27 DIAGNOSIS — C3490 Malignant neoplasm of unspecified part of unspecified bronchus or lung: Secondary | ICD-10-CM | POA: Diagnosis not present

## 2013-10-28 DIAGNOSIS — R972 Elevated prostate specific antigen [PSA]: Secondary | ICD-10-CM | POA: Diagnosis not present

## 2013-10-28 DIAGNOSIS — N529 Male erectile dysfunction, unspecified: Secondary | ICD-10-CM | POA: Diagnosis not present

## 2013-10-28 DIAGNOSIS — C61 Malignant neoplasm of prostate: Secondary | ICD-10-CM | POA: Diagnosis not present

## 2013-11-01 DIAGNOSIS — C61 Malignant neoplasm of prostate: Secondary | ICD-10-CM | POA: Diagnosis not present

## 2013-11-01 DIAGNOSIS — R509 Fever, unspecified: Secondary | ICD-10-CM | POA: Diagnosis not present

## 2013-11-01 DIAGNOSIS — N3090 Cystitis, unspecified without hematuria: Secondary | ICD-10-CM | POA: Diagnosis not present

## 2013-11-02 DIAGNOSIS — C61 Malignant neoplasm of prostate: Secondary | ICD-10-CM | POA: Diagnosis not present

## 2013-11-03 DIAGNOSIS — J342 Deviated nasal septum: Secondary | ICD-10-CM | POA: Diagnosis not present

## 2013-11-03 DIAGNOSIS — R0602 Shortness of breath: Secondary | ICD-10-CM | POA: Diagnosis not present

## 2013-11-03 DIAGNOSIS — R918 Other nonspecific abnormal finding of lung field: Secondary | ICD-10-CM | POA: Diagnosis not present

## 2013-11-03 DIAGNOSIS — J329 Chronic sinusitis, unspecified: Secondary | ICD-10-CM | POA: Diagnosis not present

## 2013-11-03 DIAGNOSIS — R509 Fever, unspecified: Secondary | ICD-10-CM | POA: Diagnosis not present

## 2013-11-03 DIAGNOSIS — J189 Pneumonia, unspecified organism: Secondary | ICD-10-CM | POA: Diagnosis not present

## 2013-11-03 DIAGNOSIS — R059 Cough, unspecified: Secondary | ICD-10-CM | POA: Diagnosis not present

## 2013-11-23 DIAGNOSIS — J189 Pneumonia, unspecified organism: Secondary | ICD-10-CM | POA: Diagnosis not present

## 2013-11-23 DIAGNOSIS — C61 Malignant neoplasm of prostate: Secondary | ICD-10-CM | POA: Diagnosis not present

## 2013-11-25 DIAGNOSIS — R918 Other nonspecific abnormal finding of lung field: Secondary | ICD-10-CM | POA: Diagnosis not present

## 2013-11-25 DIAGNOSIS — J189 Pneumonia, unspecified organism: Secondary | ICD-10-CM | POA: Diagnosis not present

## 2013-12-16 HISTORY — PX: PROSTATECTOMY: SHX69

## 2013-12-28 DIAGNOSIS — C61 Malignant neoplasm of prostate: Secondary | ICD-10-CM | POA: Diagnosis not present

## 2013-12-28 DIAGNOSIS — N529 Male erectile dysfunction, unspecified: Secondary | ICD-10-CM | POA: Diagnosis not present

## 2013-12-28 DIAGNOSIS — R3915 Urgency of urination: Secondary | ICD-10-CM | POA: Diagnosis not present

## 2013-12-29 DIAGNOSIS — R911 Solitary pulmonary nodule: Secondary | ICD-10-CM | POA: Diagnosis not present

## 2013-12-29 DIAGNOSIS — R918 Other nonspecific abnormal finding of lung field: Secondary | ICD-10-CM | POA: Diagnosis not present

## 2014-01-03 DIAGNOSIS — Z23 Encounter for immunization: Secondary | ICD-10-CM | POA: Diagnosis not present

## 2014-01-03 DIAGNOSIS — M2540 Effusion, unspecified joint: Secondary | ICD-10-CM | POA: Diagnosis not present

## 2014-01-03 DIAGNOSIS — M179 Osteoarthritis of knee, unspecified: Secondary | ICD-10-CM | POA: Diagnosis not present

## 2014-01-03 DIAGNOSIS — S83242A Other tear of medial meniscus, current injury, left knee, initial encounter: Secondary | ICD-10-CM | POA: Diagnosis not present

## 2014-01-03 DIAGNOSIS — M23222 Derangement of posterior horn of medial meniscus due to old tear or injury, left knee: Secondary | ICD-10-CM | POA: Diagnosis not present

## 2014-01-05 DIAGNOSIS — S83242D Other tear of medial meniscus, current injury, left knee, subsequent encounter: Secondary | ICD-10-CM | POA: Diagnosis not present

## 2014-01-11 DIAGNOSIS — C61 Malignant neoplasm of prostate: Secondary | ICD-10-CM | POA: Diagnosis not present

## 2014-01-12 DIAGNOSIS — Z96651 Presence of right artificial knee joint: Secondary | ICD-10-CM | POA: Diagnosis present

## 2014-01-12 DIAGNOSIS — C61 Malignant neoplasm of prostate: Secondary | ICD-10-CM | POA: Diagnosis not present

## 2014-01-12 DIAGNOSIS — K5790 Diverticulosis of intestine, part unspecified, without perforation or abscess without bleeding: Secondary | ICD-10-CM | POA: Diagnosis present

## 2014-01-25 DIAGNOSIS — C61 Malignant neoplasm of prostate: Secondary | ICD-10-CM | POA: Diagnosis not present

## 2014-01-25 DIAGNOSIS — N393 Stress incontinence (female) (male): Secondary | ICD-10-CM | POA: Diagnosis not present

## 2014-02-02 DIAGNOSIS — D709 Neutropenia, unspecified: Secondary | ICD-10-CM | POA: Diagnosis not present

## 2014-02-02 DIAGNOSIS — Z8546 Personal history of malignant neoplasm of prostate: Secondary | ICD-10-CM | POA: Diagnosis not present

## 2014-02-02 DIAGNOSIS — C61 Malignant neoplasm of prostate: Secondary | ICD-10-CM | POA: Diagnosis not present

## 2014-02-02 DIAGNOSIS — D508 Other iron deficiency anemias: Secondary | ICD-10-CM | POA: Diagnosis not present

## 2014-02-02 DIAGNOSIS — R5382 Chronic fatigue, unspecified: Secondary | ICD-10-CM | POA: Diagnosis not present

## 2014-02-02 DIAGNOSIS — R509 Fever, unspecified: Secondary | ICD-10-CM | POA: Diagnosis not present

## 2014-02-02 DIAGNOSIS — D649 Anemia, unspecified: Secondary | ICD-10-CM | POA: Diagnosis not present

## 2014-02-03 DIAGNOSIS — R509 Fever, unspecified: Secondary | ICD-10-CM | POA: Diagnosis not present

## 2014-02-03 DIAGNOSIS — C61 Malignant neoplasm of prostate: Secondary | ICD-10-CM | POA: Diagnosis not present

## 2014-02-08 DIAGNOSIS — S83242D Other tear of medial meniscus, current injury, left knee, subsequent encounter: Secondary | ICD-10-CM | POA: Diagnosis not present

## 2014-03-25 DIAGNOSIS — Z8261 Family history of arthritis: Secondary | ICD-10-CM | POA: Diagnosis not present

## 2014-03-25 DIAGNOSIS — Z961 Presence of intraocular lens: Secondary | ICD-10-CM | POA: Diagnosis not present

## 2014-03-25 DIAGNOSIS — Z96651 Presence of right artificial knee joint: Secondary | ICD-10-CM | POA: Diagnosis not present

## 2014-03-25 DIAGNOSIS — S83282A Other tear of lateral meniscus, current injury, left knee, initial encounter: Secondary | ICD-10-CM | POA: Diagnosis not present

## 2014-03-25 DIAGNOSIS — S83242A Other tear of medial meniscus, current injury, left knee, initial encounter: Secondary | ICD-10-CM | POA: Diagnosis not present

## 2014-03-25 DIAGNOSIS — Z8546 Personal history of malignant neoplasm of prostate: Secondary | ICD-10-CM | POA: Diagnosis not present

## 2014-03-25 DIAGNOSIS — Z8701 Personal history of pneumonia (recurrent): Secondary | ICD-10-CM | POA: Diagnosis not present

## 2014-03-25 DIAGNOSIS — M1712 Unilateral primary osteoarthritis, left knee: Secondary | ICD-10-CM | POA: Diagnosis not present

## 2014-03-25 DIAGNOSIS — Z79899 Other long term (current) drug therapy: Secondary | ICD-10-CM | POA: Diagnosis not present

## 2014-03-25 DIAGNOSIS — Z8489 Family history of other specified conditions: Secondary | ICD-10-CM | POA: Diagnosis not present

## 2014-03-25 DIAGNOSIS — Z01818 Encounter for other preprocedural examination: Secondary | ICD-10-CM | POA: Diagnosis not present

## 2014-03-25 DIAGNOSIS — Z8249 Family history of ischemic heart disease and other diseases of the circulatory system: Secondary | ICD-10-CM | POA: Diagnosis not present

## 2014-03-25 DIAGNOSIS — Z823 Family history of stroke: Secondary | ICD-10-CM | POA: Diagnosis not present

## 2014-04-01 DIAGNOSIS — Z79899 Other long term (current) drug therapy: Secondary | ICD-10-CM | POA: Diagnosis not present

## 2014-04-01 DIAGNOSIS — M1712 Unilateral primary osteoarthritis, left knee: Secondary | ICD-10-CM | POA: Diagnosis not present

## 2014-04-01 DIAGNOSIS — M6752 Plica syndrome, left knee: Secondary | ICD-10-CM | POA: Diagnosis not present

## 2014-04-01 DIAGNOSIS — S83242A Other tear of medial meniscus, current injury, left knee, initial encounter: Secondary | ICD-10-CM | POA: Diagnosis not present

## 2014-04-01 DIAGNOSIS — Z8701 Personal history of pneumonia (recurrent): Secondary | ICD-10-CM | POA: Diagnosis not present

## 2014-04-01 DIAGNOSIS — Z96651 Presence of right artificial knee joint: Secondary | ICD-10-CM | POA: Diagnosis not present

## 2014-04-01 DIAGNOSIS — S83282A Other tear of lateral meniscus, current injury, left knee, initial encounter: Secondary | ICD-10-CM | POA: Diagnosis not present

## 2014-04-12 DIAGNOSIS — M6281 Muscle weakness (generalized): Secondary | ICD-10-CM | POA: Diagnosis not present

## 2014-04-12 DIAGNOSIS — R262 Difficulty in walking, not elsewhere classified: Secondary | ICD-10-CM | POA: Diagnosis not present

## 2014-04-12 DIAGNOSIS — M25562 Pain in left knee: Secondary | ICD-10-CM | POA: Diagnosis not present

## 2014-04-14 DIAGNOSIS — M25562 Pain in left knee: Secondary | ICD-10-CM | POA: Diagnosis not present

## 2014-04-14 DIAGNOSIS — M6281 Muscle weakness (generalized): Secondary | ICD-10-CM | POA: Diagnosis not present

## 2014-04-14 DIAGNOSIS — R262 Difficulty in walking, not elsewhere classified: Secondary | ICD-10-CM | POA: Diagnosis not present

## 2014-04-18 DIAGNOSIS — N5231 Erectile dysfunction following radical prostatectomy: Secondary | ICD-10-CM | POA: Diagnosis not present

## 2014-04-18 DIAGNOSIS — C61 Malignant neoplasm of prostate: Secondary | ICD-10-CM | POA: Diagnosis not present

## 2014-04-19 DIAGNOSIS — M25562 Pain in left knee: Secondary | ICD-10-CM | POA: Diagnosis not present

## 2014-04-19 DIAGNOSIS — M6281 Muscle weakness (generalized): Secondary | ICD-10-CM | POA: Diagnosis not present

## 2014-04-19 DIAGNOSIS — R262 Difficulty in walking, not elsewhere classified: Secondary | ICD-10-CM | POA: Diagnosis not present

## 2014-04-22 DIAGNOSIS — R262 Difficulty in walking, not elsewhere classified: Secondary | ICD-10-CM | POA: Diagnosis not present

## 2014-04-22 DIAGNOSIS — M25562 Pain in left knee: Secondary | ICD-10-CM | POA: Diagnosis not present

## 2014-04-22 DIAGNOSIS — M6281 Muscle weakness (generalized): Secondary | ICD-10-CM | POA: Diagnosis not present

## 2014-04-26 DIAGNOSIS — R262 Difficulty in walking, not elsewhere classified: Secondary | ICD-10-CM | POA: Diagnosis not present

## 2014-04-26 DIAGNOSIS — M25562 Pain in left knee: Secondary | ICD-10-CM | POA: Diagnosis not present

## 2014-04-26 DIAGNOSIS — M6281 Muscle weakness (generalized): Secondary | ICD-10-CM | POA: Diagnosis not present

## 2014-04-28 DIAGNOSIS — R262 Difficulty in walking, not elsewhere classified: Secondary | ICD-10-CM | POA: Diagnosis not present

## 2014-04-28 DIAGNOSIS — M25562 Pain in left knee: Secondary | ICD-10-CM | POA: Diagnosis not present

## 2014-04-28 DIAGNOSIS — M6281 Muscle weakness (generalized): Secondary | ICD-10-CM | POA: Diagnosis not present

## 2014-05-03 DIAGNOSIS — M6281 Muscle weakness (generalized): Secondary | ICD-10-CM | POA: Diagnosis not present

## 2014-05-03 DIAGNOSIS — R262 Difficulty in walking, not elsewhere classified: Secondary | ICD-10-CM | POA: Diagnosis not present

## 2014-05-03 DIAGNOSIS — M25562 Pain in left knee: Secondary | ICD-10-CM | POA: Diagnosis not present

## 2014-05-05 DIAGNOSIS — M25562 Pain in left knee: Secondary | ICD-10-CM | POA: Diagnosis not present

## 2014-05-05 DIAGNOSIS — M6281 Muscle weakness (generalized): Secondary | ICD-10-CM | POA: Diagnosis not present

## 2014-05-05 DIAGNOSIS — R262 Difficulty in walking, not elsewhere classified: Secondary | ICD-10-CM | POA: Diagnosis not present

## 2014-05-10 DIAGNOSIS — R262 Difficulty in walking, not elsewhere classified: Secondary | ICD-10-CM | POA: Diagnosis not present

## 2014-05-10 DIAGNOSIS — M25562 Pain in left knee: Secondary | ICD-10-CM | POA: Diagnosis not present

## 2014-05-10 DIAGNOSIS — M6281 Muscle weakness (generalized): Secondary | ICD-10-CM | POA: Diagnosis not present

## 2014-05-23 DIAGNOSIS — M13162 Monoarthritis, not elsewhere classified, left knee: Secondary | ICD-10-CM | POA: Diagnosis not present

## 2014-05-23 DIAGNOSIS — Z9889 Other specified postprocedural states: Secondary | ICD-10-CM | POA: Diagnosis not present

## 2014-05-23 DIAGNOSIS — M1712 Unilateral primary osteoarthritis, left knee: Secondary | ICD-10-CM | POA: Diagnosis not present

## 2014-06-28 DIAGNOSIS — C61 Malignant neoplasm of prostate: Secondary | ICD-10-CM | POA: Diagnosis not present

## 2014-06-28 DIAGNOSIS — N529 Male erectile dysfunction, unspecified: Secondary | ICD-10-CM | POA: Diagnosis not present

## 2014-07-04 DIAGNOSIS — S62112A Displaced fracture of triquetrum [cuneiform] bone, left wrist, initial encounter for closed fracture: Secondary | ICD-10-CM | POA: Diagnosis not present

## 2014-07-04 DIAGNOSIS — M25532 Pain in left wrist: Secondary | ICD-10-CM | POA: Diagnosis not present

## 2014-07-04 DIAGNOSIS — M19039 Primary osteoarthritis, unspecified wrist: Secondary | ICD-10-CM | POA: Diagnosis not present

## 2014-07-06 DIAGNOSIS — R5383 Other fatigue: Secondary | ICD-10-CM | POA: Diagnosis not present

## 2014-07-06 DIAGNOSIS — R509 Fever, unspecified: Secondary | ICD-10-CM | POA: Diagnosis not present

## 2014-07-11 DIAGNOSIS — M25462 Effusion, left knee: Secondary | ICD-10-CM | POA: Diagnosis not present

## 2014-07-28 DIAGNOSIS — M109 Gout, unspecified: Secondary | ICD-10-CM | POA: Diagnosis not present

## 2014-07-28 DIAGNOSIS — S62112A Displaced fracture of triquetrum [cuneiform] bone, left wrist, initial encounter for closed fracture: Secondary | ICD-10-CM | POA: Diagnosis not present

## 2014-07-28 DIAGNOSIS — M25432 Effusion, left wrist: Secondary | ICD-10-CM | POA: Diagnosis not present

## 2014-08-04 DIAGNOSIS — M25432 Effusion, left wrist: Secondary | ICD-10-CM | POA: Diagnosis not present

## 2014-08-04 DIAGNOSIS — S62112A Displaced fracture of triquetrum [cuneiform] bone, left wrist, initial encounter for closed fracture: Secondary | ICD-10-CM | POA: Diagnosis not present

## 2014-08-09 DIAGNOSIS — C61 Malignant neoplasm of prostate: Secondary | ICD-10-CM | POA: Diagnosis not present

## 2014-08-09 DIAGNOSIS — N5231 Erectile dysfunction following radical prostatectomy: Secondary | ICD-10-CM | POA: Diagnosis not present

## 2014-08-19 DIAGNOSIS — R509 Fever, unspecified: Secondary | ICD-10-CM | POA: Diagnosis not present

## 2014-08-19 DIAGNOSIS — R05 Cough: Secondary | ICD-10-CM | POA: Diagnosis not present

## 2014-08-19 DIAGNOSIS — W57XXXA Bitten or stung by nonvenomous insect and other nonvenomous arthropods, initial encounter: Secondary | ICD-10-CM | POA: Diagnosis not present

## 2014-08-22 DIAGNOSIS — I10 Essential (primary) hypertension: Secondary | ICD-10-CM | POA: Diagnosis not present

## 2014-08-22 DIAGNOSIS — D589 Hereditary hemolytic anemia, unspecified: Secondary | ICD-10-CM | POA: Diagnosis not present

## 2014-08-22 DIAGNOSIS — E780 Pure hypercholesterolemia: Secondary | ICD-10-CM | POA: Diagnosis not present

## 2014-08-22 DIAGNOSIS — R5383 Other fatigue: Secondary | ICD-10-CM | POA: Diagnosis not present

## 2014-08-22 DIAGNOSIS — C61 Malignant neoplasm of prostate: Secondary | ICD-10-CM | POA: Diagnosis not present

## 2014-08-25 DIAGNOSIS — Z1389 Encounter for screening for other disorder: Secondary | ICD-10-CM | POA: Diagnosis not present

## 2014-08-25 DIAGNOSIS — C61 Malignant neoplasm of prostate: Secondary | ICD-10-CM | POA: Diagnosis not present

## 2014-08-25 DIAGNOSIS — Z0000 Encounter for general adult medical examination without abnormal findings: Secondary | ICD-10-CM | POA: Diagnosis not present

## 2014-09-05 DIAGNOSIS — Z9889 Other specified postprocedural states: Secondary | ICD-10-CM | POA: Diagnosis not present

## 2014-09-05 DIAGNOSIS — M25562 Pain in left knee: Secondary | ICD-10-CM | POA: Diagnosis not present

## 2014-09-05 DIAGNOSIS — M13162 Monoarthritis, not elsewhere classified, left knee: Secondary | ICD-10-CM | POA: Diagnosis not present

## 2014-10-17 DIAGNOSIS — M13162 Monoarthritis, not elsewhere classified, left knee: Secondary | ICD-10-CM | POA: Diagnosis not present

## 2014-10-17 DIAGNOSIS — M25462 Effusion, left knee: Secondary | ICD-10-CM | POA: Diagnosis not present

## 2014-10-26 DIAGNOSIS — M25562 Pain in left knee: Secondary | ICD-10-CM | POA: Diagnosis not present

## 2014-11-02 DIAGNOSIS — M79644 Pain in right finger(s): Secondary | ICD-10-CM | POA: Diagnosis not present

## 2014-11-02 DIAGNOSIS — M25531 Pain in right wrist: Secondary | ICD-10-CM | POA: Diagnosis not present

## 2014-11-02 DIAGNOSIS — M25532 Pain in left wrist: Secondary | ICD-10-CM | POA: Diagnosis not present

## 2014-11-10 DIAGNOSIS — M5116 Intervertebral disc disorders with radiculopathy, lumbar region: Secondary | ICD-10-CM | POA: Diagnosis not present

## 2014-11-10 DIAGNOSIS — M9971 Connective tissue and disc stenosis of intervertebral foramina of cervical region: Secondary | ICD-10-CM | POA: Diagnosis not present

## 2014-11-10 DIAGNOSIS — N5231 Erectile dysfunction following radical prostatectomy: Secondary | ICD-10-CM | POA: Diagnosis not present

## 2014-11-10 DIAGNOSIS — M4722 Other spondylosis with radiculopathy, cervical region: Secondary | ICD-10-CM | POA: Diagnosis not present

## 2014-11-10 DIAGNOSIS — C61 Malignant neoplasm of prostate: Secondary | ICD-10-CM | POA: Diagnosis not present

## 2014-11-10 DIAGNOSIS — M4692 Unspecified inflammatory spondylopathy, cervical region: Secondary | ICD-10-CM | POA: Diagnosis not present

## 2014-11-10 DIAGNOSIS — M47812 Spondylosis without myelopathy or radiculopathy, cervical region: Secondary | ICD-10-CM | POA: Diagnosis not present

## 2014-11-10 DIAGNOSIS — M5032 Other cervical disc degeneration, mid-cervical region: Secondary | ICD-10-CM | POA: Diagnosis not present

## 2014-11-14 DIAGNOSIS — M4692 Unspecified inflammatory spondylopathy, cervical region: Secondary | ICD-10-CM | POA: Diagnosis not present

## 2014-11-16 DIAGNOSIS — M7989 Other specified soft tissue disorders: Secondary | ICD-10-CM | POA: Diagnosis not present

## 2014-11-16 DIAGNOSIS — M5136 Other intervertebral disc degeneration, lumbar region: Secondary | ICD-10-CM | POA: Diagnosis not present

## 2014-11-16 DIAGNOSIS — R5383 Other fatigue: Secondary | ICD-10-CM | POA: Diagnosis not present

## 2014-11-16 DIAGNOSIS — M2550 Pain in unspecified joint: Secondary | ICD-10-CM | POA: Diagnosis not present

## 2014-11-16 DIAGNOSIS — M5030 Other cervical disc degeneration, unspecified cervical region: Secondary | ICD-10-CM | POA: Diagnosis not present

## 2014-11-23 DIAGNOSIS — M1712 Unilateral primary osteoarthritis, left knee: Secondary | ICD-10-CM | POA: Diagnosis not present

## 2014-11-28 DIAGNOSIS — M069 Rheumatoid arthritis, unspecified: Secondary | ICD-10-CM | POA: Diagnosis not present

## 2014-11-28 DIAGNOSIS — M4712 Other spondylosis with myelopathy, cervical region: Secondary | ICD-10-CM | POA: Diagnosis not present

## 2014-11-28 DIAGNOSIS — M4722 Other spondylosis with radiculopathy, cervical region: Secondary | ICD-10-CM | POA: Diagnosis not present

## 2014-11-30 DIAGNOSIS — M5136 Other intervertebral disc degeneration, lumbar region: Secondary | ICD-10-CM | POA: Diagnosis not present

## 2014-11-30 DIAGNOSIS — M1712 Unilateral primary osteoarthritis, left knee: Secondary | ICD-10-CM | POA: Diagnosis not present

## 2014-11-30 DIAGNOSIS — M2550 Pain in unspecified joint: Secondary | ICD-10-CM | POA: Diagnosis not present

## 2014-11-30 DIAGNOSIS — M7989 Other specified soft tissue disorders: Secondary | ICD-10-CM | POA: Diagnosis not present

## 2014-11-30 DIAGNOSIS — M0600 Rheumatoid arthritis without rheumatoid factor, unspecified site: Secondary | ICD-10-CM | POA: Diagnosis not present

## 2014-11-30 DIAGNOSIS — M25432 Effusion, left wrist: Secondary | ICD-10-CM | POA: Diagnosis not present

## 2014-11-30 DIAGNOSIS — M5030 Other cervical disc degeneration, unspecified cervical region: Secondary | ICD-10-CM | POA: Diagnosis not present

## 2014-12-09 DIAGNOSIS — M1712 Unilateral primary osteoarthritis, left knee: Secondary | ICD-10-CM | POA: Diagnosis not present

## 2014-12-20 DIAGNOSIS — M542 Cervicalgia: Secondary | ICD-10-CM | POA: Diagnosis not present

## 2014-12-20 DIAGNOSIS — M9981 Other biomechanical lesions of cervical region: Secondary | ICD-10-CM | POA: Diagnosis not present

## 2014-12-20 DIAGNOSIS — M4722 Other spondylosis with radiculopathy, cervical region: Secondary | ICD-10-CM | POA: Diagnosis not present

## 2014-12-20 DIAGNOSIS — M0579 Rheumatoid arthritis with rheumatoid factor of multiple sites without organ or systems involvement: Secondary | ICD-10-CM | POA: Diagnosis not present

## 2014-12-28 DIAGNOSIS — M7989 Other specified soft tissue disorders: Secondary | ICD-10-CM | POA: Diagnosis not present

## 2014-12-28 DIAGNOSIS — M2550 Pain in unspecified joint: Secondary | ICD-10-CM | POA: Diagnosis not present

## 2014-12-28 DIAGNOSIS — M5136 Other intervertebral disc degeneration, lumbar region: Secondary | ICD-10-CM | POA: Diagnosis not present

## 2014-12-28 DIAGNOSIS — Z87891 Personal history of nicotine dependence: Secondary | ICD-10-CM | POA: Diagnosis not present

## 2014-12-28 DIAGNOSIS — M25432 Effusion, left wrist: Secondary | ICD-10-CM | POA: Diagnosis not present

## 2014-12-28 DIAGNOSIS — M542 Cervicalgia: Secondary | ICD-10-CM | POA: Diagnosis not present

## 2014-12-28 DIAGNOSIS — M5030 Other cervical disc degeneration, unspecified cervical region: Secondary | ICD-10-CM | POA: Diagnosis not present

## 2014-12-28 DIAGNOSIS — M0600 Rheumatoid arthritis without rheumatoid factor, unspecified site: Secondary | ICD-10-CM | POA: Diagnosis not present

## 2014-12-28 DIAGNOSIS — Z79899 Other long term (current) drug therapy: Secondary | ICD-10-CM | POA: Diagnosis not present

## 2014-12-28 DIAGNOSIS — M0579 Rheumatoid arthritis with rheumatoid factor of multiple sites without organ or systems involvement: Secondary | ICD-10-CM | POA: Diagnosis not present

## 2014-12-28 DIAGNOSIS — M069 Rheumatoid arthritis, unspecified: Secondary | ICD-10-CM | POA: Diagnosis not present

## 2014-12-28 DIAGNOSIS — M9981 Other biomechanical lesions of cervical region: Secondary | ICD-10-CM | POA: Diagnosis not present

## 2014-12-28 DIAGNOSIS — Z96651 Presence of right artificial knee joint: Secondary | ICD-10-CM | POA: Diagnosis not present

## 2014-12-28 DIAGNOSIS — G8929 Other chronic pain: Secondary | ICD-10-CM | POA: Diagnosis not present

## 2014-12-28 DIAGNOSIS — Z91041 Radiographic dye allergy status: Secondary | ICD-10-CM | POA: Diagnosis not present

## 2014-12-28 DIAGNOSIS — Z8546 Personal history of malignant neoplasm of prostate: Secondary | ICD-10-CM | POA: Diagnosis not present

## 2014-12-28 DIAGNOSIS — Z7952 Long term (current) use of systemic steroids: Secondary | ICD-10-CM | POA: Diagnosis not present

## 2014-12-28 DIAGNOSIS — M47812 Spondylosis without myelopathy or radiculopathy, cervical region: Secondary | ICD-10-CM | POA: Diagnosis not present

## 2015-01-04 DIAGNOSIS — M4722 Other spondylosis with radiculopathy, cervical region: Secondary | ICD-10-CM | POA: Diagnosis not present

## 2015-01-04 DIAGNOSIS — M4712 Other spondylosis with myelopathy, cervical region: Secondary | ICD-10-CM | POA: Diagnosis not present

## 2015-01-10 DIAGNOSIS — Z23 Encounter for immunization: Secondary | ICD-10-CM | POA: Diagnosis not present

## 2015-01-20 DIAGNOSIS — M1712 Unilateral primary osteoarthritis, left knee: Secondary | ICD-10-CM | POA: Diagnosis not present

## 2015-02-06 DIAGNOSIS — M0600 Rheumatoid arthritis without rheumatoid factor, unspecified site: Secondary | ICD-10-CM | POA: Diagnosis not present

## 2015-02-06 DIAGNOSIS — Z79899 Other long term (current) drug therapy: Secondary | ICD-10-CM | POA: Diagnosis not present

## 2015-02-06 DIAGNOSIS — M2550 Pain in unspecified joint: Secondary | ICD-10-CM | POA: Diagnosis not present

## 2015-02-06 DIAGNOSIS — M25432 Effusion, left wrist: Secondary | ICD-10-CM | POA: Diagnosis not present

## 2015-02-06 DIAGNOSIS — M5030 Other cervical disc degeneration, unspecified cervical region: Secondary | ICD-10-CM | POA: Diagnosis not present

## 2015-02-06 DIAGNOSIS — R634 Abnormal weight loss: Secondary | ICD-10-CM | POA: Diagnosis not present

## 2015-02-06 DIAGNOSIS — M5136 Other intervertebral disc degeneration, lumbar region: Secondary | ICD-10-CM | POA: Diagnosis not present

## 2015-02-10 DIAGNOSIS — R1032 Left lower quadrant pain: Secondary | ICD-10-CM | POA: Diagnosis not present

## 2015-02-10 DIAGNOSIS — R1011 Right upper quadrant pain: Secondary | ICD-10-CM | POA: Diagnosis not present

## 2015-02-14 DIAGNOSIS — R1032 Left lower quadrant pain: Secondary | ICD-10-CM | POA: Diagnosis not present

## 2015-02-20 DIAGNOSIS — Z7952 Long term (current) use of systemic steroids: Secondary | ICD-10-CM | POA: Diagnosis not present

## 2015-02-20 DIAGNOSIS — M47812 Spondylosis without myelopathy or radiculopathy, cervical region: Secondary | ICD-10-CM | POA: Diagnosis not present

## 2015-02-20 DIAGNOSIS — M4322 Fusion of spine, cervical region: Secondary | ICD-10-CM | POA: Diagnosis not present

## 2015-02-20 DIAGNOSIS — M069 Rheumatoid arthritis, unspecified: Secondary | ICD-10-CM | POA: Diagnosis not present

## 2015-02-20 DIAGNOSIS — M4722 Other spondylosis with radiculopathy, cervical region: Secondary | ICD-10-CM | POA: Diagnosis not present

## 2015-02-20 DIAGNOSIS — Z8546 Personal history of malignant neoplasm of prostate: Secondary | ICD-10-CM | POA: Diagnosis not present

## 2015-02-20 DIAGNOSIS — M5020 Other cervical disc displacement, unspecified cervical region: Secondary | ICD-10-CM | POA: Diagnosis not present

## 2015-02-20 DIAGNOSIS — Z79899 Other long term (current) drug therapy: Secondary | ICD-10-CM | POA: Diagnosis not present

## 2015-02-20 DIAGNOSIS — M50122 Cervical disc disorder at C5-C6 level with radiculopathy: Secondary | ICD-10-CM | POA: Diagnosis not present

## 2015-02-20 DIAGNOSIS — Z91041 Radiographic dye allergy status: Secondary | ICD-10-CM | POA: Diagnosis not present

## 2015-02-27 DIAGNOSIS — M4722 Other spondylosis with radiculopathy, cervical region: Secondary | ICD-10-CM | POA: Diagnosis not present

## 2015-02-27 DIAGNOSIS — M47812 Spondylosis without myelopathy or radiculopathy, cervical region: Secondary | ICD-10-CM | POA: Diagnosis not present

## 2015-02-27 DIAGNOSIS — Z981 Arthrodesis status: Secondary | ICD-10-CM | POA: Diagnosis not present

## 2015-03-21 DIAGNOSIS — R634 Abnormal weight loss: Secondary | ICD-10-CM | POA: Diagnosis not present

## 2015-03-21 DIAGNOSIS — M2550 Pain in unspecified joint: Secondary | ICD-10-CM | POA: Diagnosis not present

## 2015-03-21 DIAGNOSIS — M5136 Other intervertebral disc degeneration, lumbar region: Secondary | ICD-10-CM | POA: Diagnosis not present

## 2015-03-21 DIAGNOSIS — M5030 Other cervical disc degeneration, unspecified cervical region: Secondary | ICD-10-CM | POA: Diagnosis not present

## 2015-03-21 DIAGNOSIS — Z79899 Other long term (current) drug therapy: Secondary | ICD-10-CM | POA: Diagnosis not present

## 2015-03-21 DIAGNOSIS — M25462 Effusion, left knee: Secondary | ICD-10-CM | POA: Diagnosis not present

## 2015-03-21 DIAGNOSIS — M25432 Effusion, left wrist: Secondary | ICD-10-CM | POA: Diagnosis not present

## 2015-03-21 DIAGNOSIS — M0600 Rheumatoid arthritis without rheumatoid factor, unspecified site: Secondary | ICD-10-CM | POA: Diagnosis not present

## 2015-03-27 DIAGNOSIS — L570 Actinic keratosis: Secondary | ICD-10-CM | POA: Diagnosis not present

## 2015-03-27 DIAGNOSIS — Z85828 Personal history of other malignant neoplasm of skin: Secondary | ICD-10-CM | POA: Diagnosis not present

## 2015-04-08 DIAGNOSIS — K5710 Diverticulosis of small intestine without perforation or abscess without bleeding: Secondary | ICD-10-CM | POA: Diagnosis not present

## 2015-04-08 DIAGNOSIS — M069 Rheumatoid arthritis, unspecified: Secondary | ICD-10-CM | POA: Diagnosis present

## 2015-04-08 DIAGNOSIS — Z8546 Personal history of malignant neoplasm of prostate: Secondary | ICD-10-CM | POA: Diagnosis not present

## 2015-04-08 DIAGNOSIS — Z91041 Radiographic dye allergy status: Secondary | ICD-10-CM | POA: Diagnosis not present

## 2015-04-08 DIAGNOSIS — I82402 Acute embolism and thrombosis of unspecified deep veins of left lower extremity: Secondary | ICD-10-CM | POA: Diagnosis not present

## 2015-04-08 DIAGNOSIS — Z9841 Cataract extraction status, right eye: Secondary | ICD-10-CM | POA: Diagnosis not present

## 2015-04-08 DIAGNOSIS — D649 Anemia, unspecified: Secondary | ICD-10-CM | POA: Diagnosis present

## 2015-04-08 DIAGNOSIS — R739 Hyperglycemia, unspecified: Secondary | ICD-10-CM | POA: Diagnosis not present

## 2015-04-08 DIAGNOSIS — Z9079 Acquired absence of other genital organ(s): Secondary | ICD-10-CM | POA: Diagnosis not present

## 2015-04-08 DIAGNOSIS — R509 Fever, unspecified: Secondary | ICD-10-CM | POA: Diagnosis not present

## 2015-04-08 DIAGNOSIS — K5732 Diverticulitis of large intestine without perforation or abscess without bleeding: Secondary | ICD-10-CM | POA: Diagnosis not present

## 2015-04-08 DIAGNOSIS — R1032 Left lower quadrant pain: Secondary | ICD-10-CM | POA: Diagnosis not present

## 2015-04-08 DIAGNOSIS — Z9109 Other allergy status, other than to drugs and biological substances: Secondary | ICD-10-CM | POA: Diagnosis not present

## 2015-04-08 DIAGNOSIS — I82432 Acute embolism and thrombosis of left popliteal vein: Secondary | ICD-10-CM | POA: Diagnosis present

## 2015-04-08 DIAGNOSIS — Z79899 Other long term (current) drug therapy: Secondary | ICD-10-CM | POA: Diagnosis not present

## 2015-04-08 DIAGNOSIS — D72829 Elevated white blood cell count, unspecified: Secondary | ICD-10-CM | POA: Diagnosis present

## 2015-04-08 DIAGNOSIS — I82412 Acute embolism and thrombosis of left femoral vein: Secondary | ICD-10-CM | POA: Diagnosis not present

## 2015-04-08 DIAGNOSIS — Z9842 Cataract extraction status, left eye: Secondary | ICD-10-CM | POA: Diagnosis not present

## 2015-04-08 DIAGNOSIS — Z7952 Long term (current) use of systemic steroids: Secondary | ICD-10-CM | POA: Diagnosis not present

## 2015-04-09 DIAGNOSIS — I82409 Acute embolism and thrombosis of unspecified deep veins of unspecified lower extremity: Secondary | ICD-10-CM

## 2015-04-09 HISTORY — DX: Acute embolism and thrombosis of unspecified deep veins of unspecified lower extremity: I82.409

## 2015-05-02 DIAGNOSIS — E7800 Pure hypercholesterolemia, unspecified: Secondary | ICD-10-CM | POA: Diagnosis not present

## 2015-05-02 DIAGNOSIS — I10 Essential (primary) hypertension: Secondary | ICD-10-CM | POA: Diagnosis not present

## 2015-05-02 DIAGNOSIS — E782 Mixed hyperlipidemia: Secondary | ICD-10-CM | POA: Diagnosis not present

## 2015-05-11 DIAGNOSIS — K5730 Diverticulosis of large intestine without perforation or abscess without bleeding: Secondary | ICD-10-CM | POA: Diagnosis not present

## 2015-05-11 DIAGNOSIS — M069 Rheumatoid arthritis, unspecified: Secondary | ICD-10-CM | POA: Diagnosis not present

## 2015-05-11 DIAGNOSIS — C61 Malignant neoplasm of prostate: Secondary | ICD-10-CM | POA: Diagnosis not present

## 2015-05-11 DIAGNOSIS — I8012 Phlebitis and thrombophlebitis of left femoral vein: Secondary | ICD-10-CM | POA: Diagnosis not present

## 2015-05-12 DIAGNOSIS — Z981 Arthrodesis status: Secondary | ICD-10-CM | POA: Diagnosis not present

## 2015-05-12 DIAGNOSIS — M4722 Other spondylosis with radiculopathy, cervical region: Secondary | ICD-10-CM | POA: Diagnosis not present

## 2015-05-12 DIAGNOSIS — M50122 Cervical disc disorder at C5-C6 level with radiculopathy: Secondary | ICD-10-CM | POA: Diagnosis not present

## 2015-05-12 DIAGNOSIS — M9981 Other biomechanical lesions of cervical region: Secondary | ICD-10-CM | POA: Diagnosis not present

## 2015-05-16 DIAGNOSIS — C61 Malignant neoplasm of prostate: Secondary | ICD-10-CM | POA: Diagnosis not present

## 2015-05-16 DIAGNOSIS — N5231 Erectile dysfunction following radical prostatectomy: Secondary | ICD-10-CM | POA: Diagnosis not present

## 2015-05-19 DIAGNOSIS — M069 Rheumatoid arthritis, unspecified: Secondary | ICD-10-CM | POA: Diagnosis not present

## 2015-05-19 DIAGNOSIS — M4712 Other spondylosis with myelopathy, cervical region: Secondary | ICD-10-CM | POA: Diagnosis not present

## 2015-05-19 DIAGNOSIS — M4722 Other spondylosis with radiculopathy, cervical region: Secondary | ICD-10-CM | POA: Diagnosis not present

## 2015-05-24 DIAGNOSIS — M4712 Other spondylosis with myelopathy, cervical region: Secondary | ICD-10-CM | POA: Diagnosis not present

## 2015-05-24 DIAGNOSIS — M069 Rheumatoid arthritis, unspecified: Secondary | ICD-10-CM | POA: Diagnosis not present

## 2015-05-24 DIAGNOSIS — M4722 Other spondylosis with radiculopathy, cervical region: Secondary | ICD-10-CM | POA: Diagnosis not present

## 2015-05-26 DIAGNOSIS — M4712 Other spondylosis with myelopathy, cervical region: Secondary | ICD-10-CM | POA: Diagnosis not present

## 2015-05-26 DIAGNOSIS — M4722 Other spondylosis with radiculopathy, cervical region: Secondary | ICD-10-CM | POA: Diagnosis not present

## 2015-05-26 DIAGNOSIS — M069 Rheumatoid arthritis, unspecified: Secondary | ICD-10-CM | POA: Diagnosis not present

## 2015-05-30 DIAGNOSIS — R768 Other specified abnormal immunological findings in serum: Secondary | ICD-10-CM | POA: Diagnosis not present

## 2015-05-30 DIAGNOSIS — M25462 Effusion, left knee: Secondary | ICD-10-CM | POA: Diagnosis not present

## 2015-05-30 DIAGNOSIS — M2550 Pain in unspecified joint: Secondary | ICD-10-CM | POA: Diagnosis not present

## 2015-05-30 DIAGNOSIS — M0600 Rheumatoid arthritis without rheumatoid factor, unspecified site: Secondary | ICD-10-CM | POA: Diagnosis not present

## 2015-05-30 DIAGNOSIS — Z79899 Other long term (current) drug therapy: Secondary | ICD-10-CM | POA: Diagnosis not present

## 2015-05-30 DIAGNOSIS — M5030 Other cervical disc degeneration, unspecified cervical region: Secondary | ICD-10-CM | POA: Diagnosis not present

## 2015-05-30 DIAGNOSIS — Z8739 Personal history of other diseases of the musculoskeletal system and connective tissue: Secondary | ICD-10-CM | POA: Diagnosis not present

## 2015-05-30 DIAGNOSIS — M5136 Other intervertebral disc degeneration, lumbar region: Secondary | ICD-10-CM | POA: Diagnosis not present

## 2015-06-12 ENCOUNTER — Inpatient Hospital Stay (HOSPITAL_COMMUNITY): Admission: RE | Admit: 2015-06-12 | Payer: Medicare Other | Source: Ambulatory Visit | Admitting: Orthopedic Surgery

## 2015-06-12 ENCOUNTER — Encounter (HOSPITAL_COMMUNITY): Admission: RE | Payer: Self-pay | Source: Ambulatory Visit

## 2015-06-12 SURGERY — ARTHROPLASTY, KNEE, TOTAL
Anesthesia: Choice | Site: Knee | Laterality: Left

## 2015-06-21 DIAGNOSIS — D485 Neoplasm of uncertain behavior of skin: Secondary | ICD-10-CM | POA: Diagnosis not present

## 2015-06-21 DIAGNOSIS — L570 Actinic keratosis: Secondary | ICD-10-CM | POA: Diagnosis not present

## 2015-06-21 DIAGNOSIS — C44311 Basal cell carcinoma of skin of nose: Secondary | ICD-10-CM | POA: Diagnosis not present

## 2015-06-21 DIAGNOSIS — Z85828 Personal history of other malignant neoplasm of skin: Secondary | ICD-10-CM | POA: Diagnosis not present

## 2015-07-28 DIAGNOSIS — K5710 Diverticulosis of small intestine without perforation or abscess without bleeding: Secondary | ICD-10-CM | POA: Diagnosis not present

## 2015-08-02 DIAGNOSIS — M0609 Rheumatoid arthritis without rheumatoid factor, multiple sites: Secondary | ICD-10-CM | POA: Diagnosis not present

## 2015-08-02 DIAGNOSIS — M7989 Other specified soft tissue disorders: Secondary | ICD-10-CM | POA: Diagnosis not present

## 2015-08-02 DIAGNOSIS — M06031 Rheumatoid arthritis without rheumatoid factor, right wrist: Secondary | ICD-10-CM | POA: Diagnosis not present

## 2015-08-02 DIAGNOSIS — M06041 Rheumatoid arthritis without rheumatoid factor, right hand: Secondary | ICD-10-CM | POA: Diagnosis not present

## 2015-08-02 DIAGNOSIS — M06032 Rheumatoid arthritis without rheumatoid factor, left wrist: Secondary | ICD-10-CM | POA: Diagnosis not present

## 2015-08-02 DIAGNOSIS — M5030 Other cervical disc degeneration, unspecified cervical region: Secondary | ICD-10-CM | POA: Diagnosis not present

## 2015-08-02 DIAGNOSIS — M2550 Pain in unspecified joint: Secondary | ICD-10-CM | POA: Diagnosis not present

## 2015-08-02 DIAGNOSIS — R5383 Other fatigue: Secondary | ICD-10-CM | POA: Diagnosis not present

## 2015-08-02 DIAGNOSIS — Z8739 Personal history of other diseases of the musculoskeletal system and connective tissue: Secondary | ICD-10-CM | POA: Diagnosis not present

## 2015-08-02 DIAGNOSIS — Z79899 Other long term (current) drug therapy: Secondary | ICD-10-CM | POA: Diagnosis not present

## 2015-08-02 DIAGNOSIS — M0600 Rheumatoid arthritis without rheumatoid factor, unspecified site: Secondary | ICD-10-CM | POA: Diagnosis not present

## 2015-08-02 DIAGNOSIS — M5136 Other intervertebral disc degeneration, lumbar region: Secondary | ICD-10-CM | POA: Diagnosis not present

## 2015-08-10 DIAGNOSIS — C44311 Basal cell carcinoma of skin of nose: Secondary | ICD-10-CM | POA: Diagnosis not present

## 2015-08-11 DIAGNOSIS — M0609 Rheumatoid arthritis without rheumatoid factor, multiple sites: Secondary | ICD-10-CM | POA: Diagnosis not present

## 2015-08-23 DIAGNOSIS — M1712 Unilateral primary osteoarthritis, left knee: Secondary | ICD-10-CM | POA: Diagnosis not present

## 2015-08-27 ENCOUNTER — Ambulatory Visit: Payer: Self-pay | Admitting: Orthopedic Surgery

## 2015-09-04 DIAGNOSIS — M25562 Pain in left knee: Secondary | ICD-10-CM | POA: Diagnosis not present

## 2015-09-04 DIAGNOSIS — Z86718 Personal history of other venous thrombosis and embolism: Secondary | ICD-10-CM | POA: Diagnosis not present

## 2015-09-04 DIAGNOSIS — I82812 Embolism and thrombosis of superficial veins of left lower extremities: Secondary | ICD-10-CM | POA: Diagnosis not present

## 2015-09-04 DIAGNOSIS — C61 Malignant neoplasm of prostate: Secondary | ICD-10-CM | POA: Diagnosis not present

## 2015-09-04 DIAGNOSIS — M7989 Other specified soft tissue disorders: Secondary | ICD-10-CM | POA: Diagnosis not present

## 2015-09-08 DIAGNOSIS — M0609 Rheumatoid arthritis without rheumatoid factor, multiple sites: Secondary | ICD-10-CM | POA: Diagnosis not present

## 2015-09-11 DIAGNOSIS — M069 Rheumatoid arthritis, unspecified: Secondary | ICD-10-CM | POA: Diagnosis not present

## 2015-09-11 DIAGNOSIS — I10 Essential (primary) hypertension: Secondary | ICD-10-CM | POA: Diagnosis not present

## 2015-09-11 DIAGNOSIS — I8012 Phlebitis and thrombophlebitis of left femoral vein: Secondary | ICD-10-CM | POA: Diagnosis not present

## 2015-09-25 NOTE — Progress Notes (Signed)
09/09/15-Pre-op clearance from Dr. Rory Percy on chart. 09/04/15-Unilateral Venous Duplex ultrasound on chart. 09/13/15- Office note from Dr. Rory Percy on chart.  03/25/14-DG chest 2V from [facility] on chart.

## 2015-09-25 NOTE — Patient Instructions (Signed)
David Joseph  09/25/2015   Your procedure is scheduled on: Monday 10/09/2015  Report to [facility]  Entrance take David Joseph  elevators to 3rd floor to  David Joseph at  0600  AM.  Call this number if you have problems the morning of Joseph [phone]   Remember: ONLY 1 PERSON MAY GO WITH YOU TO SHORT STAY TO GET  READY MORNING OF David Joseph.   Do not eat food or drink liquids :After Midnight.     Take these medicines the morning of Joseph with A SIP OF WATER: Prednisone                                You may not have any metal on your body including hair pins and              piercings  Do not wear jewelry, make-up, lotions, powders or perfumes, deodorant             Do not wear nail polish.  Do not shave  48 hours prior to Joseph.              Men may shave face and neck.   Do not bring valuables to the hospital. [facility].  Contacts, dentures or bridgework may not be worn into Joseph.  Leave suitcase in the car. After Joseph it may be brought to your room.                    Please read over the following fact sheets you were given: _____________________________________________________________________             David Joseph - Preparing for Joseph Before Joseph, you can play an important role.  Because skin is not sterile, your skin needs to be as free of germs as possible.  You can reduce the number of germs on your skin by washing with CHG (chlorahexidine gluconate) soap before Joseph.  CHG is an antiseptic cleaner which kills germs and bonds with the skin to continue killing germs even after washing. Please DO NOT use if you have an allergy to CHG or antibacterial soaps.  If your skin becomes reddened/irritated stop using the CHG and inform your nurse when you arrive at Short Stay. Do not shave (including legs and underarms) for at least 48 hours prior to the first CHG shower.  You  may shave your face/neck. Please follow these instructions carefully:  1.  Shower with CHG Soap the night before Joseph and the  morning of Joseph.  2.  If you choose to wash your hair, wash your hair first as usual with your  normal  shampoo.  3.  After you shampoo, rinse your hair and body thoroughly to remove the  shampoo.                           4.  Use CHG as you would any other liquid soap.  You can apply chg directly  to the skin and wash                       Gently with a scrungie or clean washcloth.  5.  Apply the CHG Soap to your body ONLY FROM THE NECK DOWN.   Do not use on face/ open                           Wound or open sores. Avoid contact with eyes, ears mouth and genitals (private parts).                       Wash face,  Genitals (private parts) with your normal soap.             6.  Wash thoroughly, paying special attention to the area where your Joseph  will be performed.  7.  Thoroughly rinse your body with warm water from the neck down.  8.  DO NOT shower/wash with your normal soap after using and rinsing off  the CHG Soap.                9.  Pat yourself dry with a clean towel.            10.  Wear clean pajamas.            11.  Place clean sheets on your bed the night of your first shower and do not  sleep with pets. Day of Joseph : Do not apply any lotions/deodorants the morning of Joseph.  Please wear clean clothes to the hospital/Joseph Joseph.  FAILURE TO FOLLOW THESE INSTRUCTIONS MAY RESULT IN THE CANCELLATION OF YOUR Joseph PATIENT SIGNATURE_________________________________  NURSE SIGNATURE__________________________________  ________________________________________________________________________   David Joseph  An incentive spirometer is a tool that can help keep your lungs clear and active. This tool measures how well you are filling your lungs with each breath. Taking long deep breaths may help reverse or decrease the chance of  developing breathing (pulmonary) problems (especially infection) following:  A long period of time when you are unable to move or be active. BEFORE THE PROCEDURE   If the spirometer includes an indicator to show your best effort, your nurse or respiratory therapist will set it to a desired goal.  If possible, sit up straight or lean slightly forward. Try not to slouch.  Hold the incentive spirometer in an upright position. INSTRUCTIONS FOR USE   Sit on the edge of your bed if possible, or sit up as far as you can in bed or on a chair.  Hold the incentive spirometer in an upright position.  Breathe out normally.  Place the mouthpiece in your mouth and seal your lips tightly around it.  Breathe in slowly and as deeply as possible, raising the piston or the ball toward the top of the column.  Hold your breath for 3-5 seconds or for as long as possible. Allow the piston or ball to fall to the bottom of the column.  Remove the mouthpiece from your mouth and breathe out normally.  Rest for a few seconds and repeat Steps 1 through 7 at least 10 times every 1-2 hours when you are awake. Take your time and take a few normal breaths between deep breaths.  The spirometer may include an indicator to show your best effort. Use the indicator as a goal to work toward during each repetition.  After each set of 10 deep breaths, practice coughing to be sure your lungs are clear. If you have an incision (the cut made at the time of Joseph), support your incision when coughing by placing a  pillow or rolled up towels firmly against it. Once you are able to get out of bed, walk around indoors and cough well. You may stop using the incentive spirometer when instructed by your caregiver.  RISKS AND COMPLICATIONS  Take your time so you do not get dizzy or light-headed.  If you are in pain, you may need to take or ask for pain medication before doing incentive spirometry. It is harder to take a deep  breath if you are having pain. AFTER USE  Rest and breathe slowly and easily.  It can be helpful to keep track of a log of your progress. Your caregiver can provide you with a simple table to help with this. If you are using the spirometer at home, follow these instructions: [place] IF:   You are having difficultly using the spirometer.  You have trouble using the spirometer as often as instructed.  Your pain medication is not giving enough relief while using the spirometer.  You develop fever of 100.5 F (38.1 C) or higher. SEEK IMMEDIATE MEDICAL CARE IF:   You cough up bloody sputum that had not been present before.  You develop fever of 102 F (38.9 C) or greater.  You develop worsening pain at or near the incision site. MAKE SURE YOU:   Understand these instructions.  Will watch your condition.  Will get help right away if you are not doing well or get worse. Document Released: 07/15/2006 Document Revised: 05/27/2011 Document Reviewed: 09/15/2006 Exit[employer] Patient Information 2014 Exit[employer], [place].   ________________________________________________________________________  WHAT IS A BLOOD TRANSFUSION? Blood Transfusion Information  A transfusion is the replacement of blood or some of its parts. Blood is made up of multiple cells which provide different functions.  Red blood cells carry oxygen and are used for blood loss replacement.  White blood cells fight against infection.  Platelets control bleeding.  Plasma helps clot blood.  Other blood products are available for specialized needs, such as hemophilia or other clotting disorders. BEFORE THE TRANSFUSION  Who gives blood for transfusions?   Healthy volunteers who are fully evaluated to make sure their blood is safe. This is blood bank blood. Transfusion therapy is the safest it has ever been in the practice of medicine. Before blood is taken from a donor, a complete history is taken to make sure  that person has no history of diseases nor engages in risky social behavior (examples are intravenous drug use or sexual activity with multiple partners). The donor's travel history is screened to minimize risk of transmitting infections, such as malaria. The donated blood is tested for signs of infectious diseases, such as HIV and hepatitis. The blood is then tested to be sure it is compatible with you in order to minimize the chance of a transfusion reaction. If you or a relative donates blood, this is often done in anticipation of Joseph and is not appropriate for emergency situations. It takes many days to process the donated blood. RISKS AND COMPLICATIONS Although transfusion therapy is very safe and saves many lives, the main dangers of transfusion include:   Getting an infectious disease.  Developing a transfusion reaction. This is an allergic reaction to something in the blood you were given. Every precaution is taken to prevent this. The decision to have a blood transfusion has been considered carefully by your caregiver before blood is given. Blood is not given unless the benefits outweigh the risks. AFTER THE TRANSFUSION  Right after receiving a blood transfusion, you  will usually feel much better and more energetic. This is especially true if your red blood cells have gotten low (anemic). The transfusion raises the level of the red blood cells which carry oxygen, and this usually causes an energy increase.  The nurse administering the transfusion will monitor you carefully for complications. HOME CARE INSTRUCTIONS  No special instructions are needed after a transfusion. You may find your energy is better. Speak with your caregiver about any limitations on activity for underlying diseases you may have. SEEK MEDICAL CARE IF:   Your condition is not improving after your transfusion.  You develop redness or irritation at the intravenous (IV) site. SEEK IMMEDIATE MEDICAL CARE IF:  Any of  the following symptoms occur over the next 12 hours:  Shaking chills.  You have a temperature by mouth above 102 F (38.9 C), not controlled by medicine.  Chest, back, or muscle pain.  People around you feel you are not acting correctly or are confused.  Shortness of breath or difficulty breathing.  Dizziness and fainting.  You get a rash or develop hives.  You have a decrease in urine output.  Your urine turns a dark color or changes to pink, red, or brown. Any of the following symptoms occur over the next 10 days:  You have a temperature by mouth above 102 F (38.9 C), not controlled by medicine.  Shortness of breath.  Weakness after normal activity.  The white part of the eye turns yellow (jaundice).  You have a decrease in the amount of urine or are urinating less often.  Your urine turns a dark color or changes to pink, red, or brown. Document Released: 03/01/2000 Document Revised: 05/27/2011 Document Reviewed: 10/19/2007 [facility] Patient Information 2014 [place], [place].  _______________________________________________________________________

## 2015-09-26 ENCOUNTER — Encounter (HOSPITAL_COMMUNITY)
Admission: RE | Admit: 2015-09-26 | Discharge: 2015-09-26 | Disposition: A | Discharge status: Home / Self Care | Payer: Medicare Other | Source: Ambulatory Visit | Attending: Orthopedic Surgery | Admitting: Orthopedic Surgery

## 2015-09-26 ENCOUNTER — Encounter (HOSPITAL_COMMUNITY): Payer: Self-pay

## 2015-09-26 DIAGNOSIS — Z86718 Personal history of other venous thrombosis and embolism: Secondary | ICD-10-CM | POA: Diagnosis not present

## 2015-09-26 DIAGNOSIS — D62 Acute posthemorrhagic anemia: Secondary | ICD-10-CM | POA: Diagnosis not present

## 2015-09-26 DIAGNOSIS — Z01812 Encounter for preprocedural laboratory examination: Secondary | ICD-10-CM | POA: Insufficient documentation

## 2015-09-26 DIAGNOSIS — Z96651 Presence of right artificial knee joint: Secondary | ICD-10-CM | POA: Insufficient documentation

## 2015-09-26 HISTORY — DX: Anemia, unspecified: D64.9

## 2015-09-26 HISTORY — DX: Acute embolism and thrombosis of unspecified deep veins of unspecified lower extremity: I82.409

## 2015-09-26 LAB — COMPREHENSIVE METABOLIC PANEL
ALT: 17 U/L (ref 17–63)
AST: 19 U/L (ref 15–41)
Albumin: 4.5 g/dL (ref 3.5–5.0)
Alkaline Phosphatase: 47 U/L (ref 38–126)
Anion gap: 5 (ref 5–15)
BILIRUBIN TOTAL: 1.9 mg/dL — AB (ref 0.3–1.2)
BUN: 18 mg/dL (ref 6–20)
CALCIUM: 9.2 mg/dL (ref 8.9–10.3)
CO2: 30 mmol/L (ref 22–32)
CREATININE: 0.82 mg/dL (ref 0.61–1.24)
Chloride: 102 mmol/L (ref 101–111)
GFR calc Af Amer: >60 mL/min (ref >60)
Glucose, Bld: 111 mg/dL — ABNORMAL HIGH (ref 65–99)
POTASSIUM: 4.1 mmol/L (ref 3.5–5.1)
Sodium: 137 mmol/L (ref 135–145)
TOTAL PROTEIN: 7.5 g/dL (ref 6.5–8.1)

## 2015-09-26 LAB — CBC
HEMATOCRIT: 31.5 % — AB (ref 39.0–52.0)
Hemoglobin: 10.9 g/dL — ABNORMAL LOW (ref 13.0–17.0)
MCH: 36.9 pg — ABNORMAL HIGH (ref 26.0–34.0)
MCHC: 34.6 g/dL (ref 30.0–36.0)
MCV: 106.8 fL — AB (ref 78.0–100.0)
Platelets: 230 10*3/uL (ref 150–400)
RBC: 2.95 MIL/uL — ABNORMAL LOW (ref 4.22–5.81)
RDW: 13.3 % (ref 11.5–15.5)
WBC: 5.9 10*3/uL (ref 4.0–10.5)

## 2015-09-26 LAB — PROTIME-INR
INR: 2.4 — ABNORMAL HIGH (ref 0.00–1.49)
PROTHROMBIN TIME: 25.1 s — AB (ref 11.6–15.2)

## 2015-09-26 LAB — URINALYSIS, ROUTINE W REFLEX MICROSCOPIC
BILIRUBIN URINE: NEGATIVE
GLUCOSE, UA: NEGATIVE mg/dL
HGB URINE DIPSTICK: NEGATIVE
KETONES UR: NEGATIVE mg/dL
Leukocytes, UA: NEGATIVE
NITRITE: NEGATIVE
PH: 6 (ref 5.0–8.0)
Protein, ur: NEGATIVE mg/dL
Specific Gravity, Urine: 1.014 (ref 1.005–1.030)

## 2015-09-26 LAB — SURGICAL PCR SCREEN
MRSA, PCR: NEGATIVE
Staphylococcus aureus: NEGATIVE

## 2015-09-26 LAB — APTT: aPTT: 39 seconds — ABNORMAL HIGH (ref 24–37)

## 2015-09-28 ENCOUNTER — Ambulatory Visit: Payer: Self-pay | Admitting: Orthopedic Surgery

## 2015-09-28 NOTE — H&P (Signed)
David Joseph DOB: 06-11-1940 Married / Language: English / Race: White Male Date of Admission:  10/09/2015 CC:  Left Knee Pain History of Present Illness The patient is a 75 year old male who comes in  for a preoperative History and Physical. The patient is scheduled for a left total knee arthroplasty to be performed by Dr. Dione Plover. Aluisio, MD at [facility] on 10/09/2015. The patient is a 76 year old male who presented for follow up of their knee. The patient is being followed for their left knee pain and osteoarthritis. They are now out from Euflexxa series. Symptoms reported include: pain, swelling (radiating down leg; into ankle), aching, giving way, instability and difficulty ambulating. The patient feels that they are doing poorly. The patient has not gotten any relief of their symptoms with viscosupplementation. Unfortunately, Euflexxa injections did not provide much benefit. He still has persistent pain and swelling in that left knee. 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 He is about to have cervical surgery with Dr. Glenna Fellows. He feels that the left knee, however, is getting progressively worse. He has reached a point where he would like to get the knee replaced.  They have been treated conservatively in the past for the above stated problem and despite conservative measures, they continue to have progressive pain and severe functional limitations and dysfunction. They have failed non-operative management including home exercise, medications, and injections. It is felt that they would benefit from undergoing total joint replacement. Risks and benefits of the procedure have been discussed with the patient and they elect to proceed with surgery. There are no active contraindications to surgery such as ongoing infection or rapidly progressive neurological disease.  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)  Left knee pain (M25.562)  Diverticulitis Of Colon  Degenerative Disc Disease  Diverticulosis  Pneumonia  Osteoarthritis  Skin Cancer  Primary osteoarthritis of left knee (M17.12)  Prostate Cancer  Allergies IV Contrast  Family History  Osteoarthritis  mother First Degree Relatives  Cancer  grandmother mothers side Heart Disease  mother  Social History Number of flights of stairs before winded  4-5 Marital status  married Living situation  live with spouse Pain Contract  no Tobacco use  never smoker Tobacco / smoke exposure  no Current work status  retired Careers information officer  2 Alcohol use  current drinker; drinks wine; 5-7 per week Drug/Alcohol Rehab (Currently)  no Illicit drug use  no Exercise  Exercises rarely; does individual sport Drug/Alcohol Rehab (Previously)  no Post-Surgical Plans  Plan is to go home following the Left TKA to be performed on 10/09/2015. Advance Directives  Healthcare POA  Medication History  PredniSONE ('20MG'$  Tablet, Oral) Active. Iron (Ferrous Gluconate) (Oral) Specific strength unknown - Active. Folic Acid ('1MG'$  Tablet, Oral) Active. Methotrexate (2.'5MG'$  Tablet, 6 Oral every Monday) Active.  Past Surgical History  Tonsillectomy  Spinal Surgery  Date: 48. L5-S1 Cataract Surgery  Date: 2010. bilateral Finger Surgery  Date: 2012. Total Knee Replacement  Date: 06/07/2010. right Prostate Surgery - Removal  Date: 12/2013. Cervical Fusion  Date: 02/2015.  Review of Systems General Not Present- Chills, Fatigue, Fever, Memory Loss, Night Sweats, Weight Gain and Weight Loss. Skin Not Present- Eczema, Hives, Itching, Lesions and Rash. HEENT Present- Hearing Loss. Not Present- Dentures, Double Vision, Headache, Tinnitus and  Visual Loss. Respiratory Not Present- Allergies,  Chronic Cough, Coughing up blood, Shortness of breath at rest and Shortness of breath with exertion. Cardiovascular Not Present- Chest Pain, Difficulty Breathing Lying Down, Murmur, Palpitations, Racing/skipping heartbeats and Swelling. Gastrointestinal Not Present- Abdominal Pain, Bloody Stool, Constipation, Diarrhea, Difficulty Swallowing, Heartburn, Jaundice, Loss of appetitie, Nausea and Vomiting. Male Genitourinary Not Present- Blood in Urine, Discharge, Flank Pain, Incontinence, Painful Urination, Urgency, Urinary frequency, Urinary Retention, Urinating at Night and Weak urinary stream. Musculoskeletal Present- Joint Pain, Joint Swelling and Morning Stiffness. Not Present- Back Pain, Muscle Pain, Muscle Weakness and Spasms. Neurological Not Present- Blackout spells, Difficulty with balance, Dizziness, Paralysis, Tremor and Weakness. Psychiatric Not Present- Insomnia.  Vitals Weight: 177 lb Height: 69in Weight was reported by patient. Height was reported by patient. Body Surface Area: 1.96 m Body Mass Index: 26.14 kg/m  Pulse: 68 (Regular)  BP: 110/46 (Sitting, Right Arm, Standard)   Physical Exam General Mental Status -Alert, cooperative and good historian. General Appearance-pleasant, Not in acute distress. Orientation-Oriented X3. Build & Nutrition-Well nourished and Well developed.  Head and Neck Head-normocephalic, atraumatic . Neck Global Assessment - supple, no bruit auscultated on the right, no bruit auscultated on the left.  Eye Vision-Wears corrective lenses(readers). Pupil - Bilateral-Regular and Round. Motion - Bilateral-EOMI.  Chest and Lung Exam Auscultation Breath sounds - clear at anterior chest wall and clear at posterior chest wall. Adventitious sounds - No Adventitious sounds.  Cardiovascular Auscultation Rhythm - Regular rate and rhythm. Heart Sounds - S1 WNL and S2 WNL. Murmurs & Other Heart Sounds - Auscultation of the  heart reveals - No Murmurs.  Abdomen Palpation/Percussion Tenderness - Abdomen is non-tender to palpation. Rigidity (guarding) - Abdomen is soft. Auscultation Auscultation of the abdomen reveals - Bowel sounds normal.  Male Genitourinary Note: Not done, not pertinent to present illness   Musculoskeletal Note: On exam, he is alert and oriented, in no apparent distress. His left hip has normal motion with no discomfort. His left knee shows a moderate effusion. His range of motion is about 5 to 125. He is tender medially. There is some lateral tenderness also. There is no instability noted. Pulses, sensation and motor are intact.  RADIOGRAPHS We reviewed his radiographs, AP and lateral of the knee, showing his revision prosthesis on the right in excellent position with no abnormalities. On the left, he has got bone on bone arthritis medially as well as patellofemoral bone on bone.  Assessment & Plan Primary osteoarthritis of left knee (M17.12)  Note:Surgical Plans: Left Total Knee Replacement  Disposition: Home  PCP: Dr. Nadara Mustard - Patient has been seen preoperatively and felt to be stable for surgery. "Patient is cleared for surgery with the recommendations of 6 weeks postop Xarelto 10 mg."  Topical TXA - History of DVT  Anesthesia Issues: None  Signed electronically by Ok Edwards, III PA-C

## 2015-10-02 DIAGNOSIS — M2550 Pain in unspecified joint: Secondary | ICD-10-CM | POA: Diagnosis not present

## 2015-10-02 DIAGNOSIS — J3489 Other specified disorders of nose and nasal sinuses: Secondary | ICD-10-CM | POA: Diagnosis not present

## 2015-10-02 DIAGNOSIS — M0600 Rheumatoid arthritis without rheumatoid factor, unspecified site: Secondary | ICD-10-CM | POA: Diagnosis not present

## 2015-10-02 DIAGNOSIS — Z8739 Personal history of other diseases of the musculoskeletal system and connective tissue: Secondary | ICD-10-CM | POA: Diagnosis not present

## 2015-10-02 DIAGNOSIS — M5030 Other cervical disc degeneration, unspecified cervical region: Secondary | ICD-10-CM | POA: Diagnosis not present

## 2015-10-02 DIAGNOSIS — Z79899 Other long term (current) drug therapy: Secondary | ICD-10-CM | POA: Diagnosis not present

## 2015-10-02 DIAGNOSIS — M5136 Other intervertebral disc degeneration, lumbar region: Secondary | ICD-10-CM | POA: Diagnosis not present

## 2015-10-04 DIAGNOSIS — R509 Fever, unspecified: Secondary | ICD-10-CM | POA: Diagnosis not present

## 2015-10-04 DIAGNOSIS — M069 Rheumatoid arthritis, unspecified: Secondary | ICD-10-CM | POA: Diagnosis not present

## 2015-10-04 DIAGNOSIS — I700 Atherosclerosis of aorta: Secondary | ICD-10-CM | POA: Diagnosis not present

## 2015-10-04 DIAGNOSIS — R05 Cough: Secondary | ICD-10-CM | POA: Diagnosis not present

## 2015-10-04 DIAGNOSIS — Z6825 Body mass index (BMI) 25.0-25.9, adult: Secondary | ICD-10-CM | POA: Diagnosis not present

## 2015-10-06 ENCOUNTER — Encounter: Payer: Self-pay | Admitting: Cardiovascular Disease

## 2015-10-06 DIAGNOSIS — J449 Chronic obstructive pulmonary disease, unspecified: Secondary | ICD-10-CM | POA: Diagnosis not present

## 2015-10-06 DIAGNOSIS — I7091 Generalized atherosclerosis: Secondary | ICD-10-CM | POA: Diagnosis not present

## 2015-10-06 DIAGNOSIS — R05 Cough: Secondary | ICD-10-CM | POA: Diagnosis not present

## 2015-10-06 DIAGNOSIS — Z9889 Other specified postprocedural states: Secondary | ICD-10-CM | POA: Diagnosis not present

## 2015-10-06 DIAGNOSIS — R509 Fever, unspecified: Secondary | ICD-10-CM | POA: Diagnosis not present

## 2015-10-06 DIAGNOSIS — K5730 Diverticulosis of large intestine without perforation or abscess without bleeding: Secondary | ICD-10-CM | POA: Diagnosis not present

## 2015-10-09 ENCOUNTER — Inpatient Hospital Stay (HOSPITAL_COMMUNITY): Payer: Medicare Other | Admitting: Anesthesiology

## 2015-10-09 ENCOUNTER — Encounter (HOSPITAL_COMMUNITY): Payer: Self-pay | Admitting: *Deleted

## 2015-10-09 ENCOUNTER — Encounter (HOSPITAL_COMMUNITY)
Admission: RE | Disposition: A | Discharge status: Home / Self Care | Payer: Self-pay | Source: Ambulatory Visit | Attending: Orthopedic Surgery

## 2015-10-09 ENCOUNTER — Inpatient Hospital Stay (HOSPITAL_COMMUNITY)
Admission: RE | Admit: 2015-10-09 | Discharge: 2015-10-11 | DRG: 470 | Disposition: A | Discharge status: Home / Self Care | Payer: Medicare Other | Source: Ambulatory Visit | Attending: Orthopedic Surgery | Admitting: Orthopedic Surgery

## 2015-10-09 DIAGNOSIS — Z79899 Other long term (current) drug therapy: Secondary | ICD-10-CM | POA: Diagnosis not present

## 2015-10-09 DIAGNOSIS — M5020 Other cervical disc displacement, unspecified cervical region: Secondary | ICD-10-CM | POA: Diagnosis not present

## 2015-10-09 DIAGNOSIS — M1712 Unilateral primary osteoarthritis, left knee: Secondary | ICD-10-CM | POA: Diagnosis not present

## 2015-10-09 DIAGNOSIS — M25562 Pain in left knee: Secondary | ICD-10-CM | POA: Diagnosis not present

## 2015-10-09 DIAGNOSIS — M1710 Unilateral primary osteoarthritis, unspecified knee: Secondary | ICD-10-CM | POA: Diagnosis present

## 2015-10-09 DIAGNOSIS — M179 Osteoarthritis of knee, unspecified: Secondary | ICD-10-CM | POA: Diagnosis present

## 2015-10-09 DIAGNOSIS — Z86718 Personal history of other venous thrombosis and embolism: Secondary | ICD-10-CM

## 2015-10-09 DIAGNOSIS — Z87891 Personal history of nicotine dependence: Secondary | ICD-10-CM

## 2015-10-09 DIAGNOSIS — Z96652 Presence of left artificial knee joint: Secondary | ICD-10-CM | POA: Diagnosis present

## 2015-10-09 HISTORY — PX: TOTAL KNEE ARTHROPLASTY: SHX125

## 2015-10-09 LAB — PROTIME-INR
INR: 1.04 (ref 0.00–1.49)
PROTHROMBIN TIME: 13.8 s (ref 11.6–15.2)

## 2015-10-09 LAB — TYPE AND SCREEN
ABO/RH(D): A POS
Antibody Screen: NEGATIVE

## 2015-10-09 SURGERY — ARTHROPLASTY, KNEE, TOTAL
Anesthesia: Monitor Anesthesia Care | Site: Knee | Laterality: Left

## 2015-10-09 MED ORDER — ONDANSETRON HCL 4 MG PO TABS: 4.0000 mg | ORAL_TABLET | Freq: Four times a day (QID) | ORAL | Status: DC | PRN

## 2015-10-09 MED ORDER — LIDOCAINE HCL (CARDIAC) 20 MG/ML IV SOLN
INTRAVENOUS | Status: DC | PRN
Start: 2015-10-09 — End: 2015-10-09
  Administered 2015-10-09: 50 mg via INTRATRACHEAL

## 2015-10-09 MED ORDER — BUPIVACAINE IN DEXTROSE 0.75-8.25 % IT SOLN
INTRATHECAL | Status: DC | PRN
  Administered 2015-10-09: 2 mL via INTRATHECAL

## 2015-10-09 MED ORDER — ACETAMINOPHEN 500 MG PO TABS
1000.0000 mg | ORAL_TABLET | Freq: Four times a day (QID) | ORAL | Status: AC
  Administered 2015-10-09 – 2015-10-10 (×3): 1000 mg via ORAL
  Filled 2015-10-09 (×4): qty 2

## 2015-10-09 MED ORDER — LIDOCAINE HCL (CARDIAC) 20 MG/ML IV SOLN
INTRAVENOUS | Status: AC
  Filled 2015-10-09: qty 5

## 2015-10-09 MED ORDER — FENTANYL CITRATE (PF) 100 MCG/2ML IJ SOLN
INTRAMUSCULAR | Status: AC
  Filled 2015-10-09: qty 2

## 2015-10-09 MED ORDER — BUPIVACAINE HCL 0.25 % IJ SOLN
INTRAMUSCULAR | Status: DC | PRN
  Administered 2015-10-09: 20 mL

## 2015-10-09 MED ORDER — PREDNISONE 5 MG PO TABS
5.0000 mg | ORAL_TABLET | Freq: Two times a day (BID) | ORAL | Status: DC
  Administered 2015-10-11: 5 mg via ORAL
  Filled 2015-10-09: qty 1

## 2015-10-09 MED ORDER — HYDROMORPHONE HCL 1 MG/ML IJ SOLN
0.2500 mg | INTRAMUSCULAR | Status: DC | PRN
  Administered 2015-10-09 (×2): 0.5 mg via INTRAVENOUS

## 2015-10-09 MED ORDER — ONDANSETRON HCL 4 MG/2ML IJ SOLN: 4.0000 mg | Freq: Four times a day (QID) | INTRAMUSCULAR | Status: DC | PRN

## 2015-10-09 MED ORDER — SODIUM CHLORIDE 0.9 % IV SOLN
INTRAVENOUS | Status: DC
  Administered 2015-10-09: 100 mL/h via INTRAVENOUS

## 2015-10-09 MED ORDER — PREDNISONE 5 MG PO TABS: 5.0000 mg | ORAL_TABLET | Freq: Every day | ORAL | Status: DC

## 2015-10-09 MED ORDER — ONDANSETRON HCL 4 MG/2ML IJ SOLN
INTRAMUSCULAR | Status: DC | PRN
  Administered 2015-10-09: 4 mg via INTRAVENOUS

## 2015-10-09 MED ORDER — ACETAMINOPHEN 325 MG PO TABS
650.0000 mg | ORAL_TABLET | Freq: Four times a day (QID) | ORAL | Status: DC | PRN
Start: 2015-10-10 — End: 2015-10-11
  Administered 2015-10-10 – 2015-10-11 (×2): 650 mg via ORAL
  Filled 2015-10-09 (×2): qty 2

## 2015-10-09 MED ORDER — PREDNISONE 10 MG PO TABS
10.0000 mg | ORAL_TABLET | Freq: Two times a day (BID) | ORAL | Status: AC
  Administered 2015-10-10: 10 mg via ORAL
  Filled 2015-10-09: qty 2
  Filled 2015-10-09 (×2): qty 1

## 2015-10-09 MED ORDER — DEXAMETHASONE SODIUM PHOSPHATE 10 MG/ML IJ SOLN
10.0000 mg | Freq: Once | INTRAMUSCULAR | Status: AC
  Administered 2015-10-09: 10 mg via INTRAVENOUS

## 2015-10-09 MED ORDER — MIDAZOLAM HCL 2 MG/2ML IJ SOLN
INTRAMUSCULAR | Status: AC
  Filled 2015-10-09: qty 2

## 2015-10-09 MED ORDER — CEFAZOLIN SODIUM-DEXTROSE 2-4 GM/100ML-% IV SOLN
2.0000 g | INTRAVENOUS | Status: AC
  Administered 2015-10-09: 2 g via INTRAVENOUS
  Filled 2015-10-09: qty 100

## 2015-10-09 MED ORDER — DEXAMETHASONE SODIUM PHOSPHATE 10 MG/ML IJ SOLN
INTRAMUSCULAR | Status: AC
  Filled 2015-10-09: qty 1

## 2015-10-09 MED ORDER — RIVAROXABAN 10 MG PO TABS
10.0000 mg | ORAL_TABLET | Freq: Every day | ORAL | Status: DC
  Administered 2015-10-10 – 2015-10-11 (×2): 10 mg via ORAL
  Filled 2015-10-09: qty 1

## 2015-10-09 MED ORDER — BISACODYL 10 MG RE SUPP: 10.0000 mg | Freq: Every day | RECTAL | Status: DC | PRN

## 2015-10-09 MED ORDER — DOCUSATE SODIUM 100 MG PO CAPS
100.0000 mg | ORAL_CAPSULE | Freq: Two times a day (BID) | ORAL | Status: DC
  Administered 2015-10-09 – 2015-10-11 (×4): 100 mg via ORAL
  Filled 2015-10-09 (×3): qty 1

## 2015-10-09 MED ORDER — MORPHINE SULFATE (PF) 10 MG/ML IV SOLN
1.0000 mg | INTRAVENOUS | Status: DC | PRN
Start: 2015-10-09 — End: 2015-10-09

## 2015-10-09 MED ORDER — ROCURONIUM BROMIDE 100 MG/10ML IV SOLN
INTRAVENOUS | Status: AC
  Filled 2015-10-09: qty 1

## 2015-10-09 MED ORDER — DIPHENHYDRAMINE HCL 12.5 MG/5ML PO ELIX: 12.5000 mg | ORAL_SOLUTION | ORAL | Status: DC | PRN

## 2015-10-09 MED ORDER — CEFAZOLIN SODIUM-DEXTROSE 2-4 GM/100ML-% IV SOLN
INTRAVENOUS | Status: AC
  Filled 2015-10-09: qty 100

## 2015-10-09 MED ORDER — BUPIVACAINE LIPOSOME 1.3 % IJ SUSP
20.0000 mL | Freq: Once | INTRAMUSCULAR | Status: DC
  Filled 2015-10-09: qty 20

## 2015-10-09 MED ORDER — ONDANSETRON HCL 4 MG/2ML IJ SOLN
INTRAMUSCULAR | Status: AC
  Filled 2015-10-09: qty 2

## 2015-10-09 MED ORDER — CEFAZOLIN SODIUM-DEXTROSE 2-4 GM/100ML-% IV SOLN
2.0000 g | Freq: Four times a day (QID) | INTRAVENOUS | Status: AC
  Administered 2015-10-09 (×2): 2 g via INTRAVENOUS
  Filled 2015-10-09 (×2): qty 100

## 2015-10-09 MED ORDER — DEXAMETHASONE SODIUM PHOSPHATE 10 MG/ML IJ SOLN
10.0000 mg | Freq: Once | INTRAMUSCULAR | Status: AC
  Administered 2015-10-10: 10 mg via INTRAVENOUS
  Filled 2015-10-09: qty 1

## 2015-10-09 MED ORDER — ACETAMINOPHEN 650 MG RE SUPP: 650.0000 mg | Freq: Four times a day (QID) | RECTAL | Status: DC | PRN

## 2015-10-09 MED ORDER — MIDAZOLAM HCL 5 MG/5ML IJ SOLN
INTRAMUSCULAR | Status: DC | PRN
  Administered 2015-10-09 (×2): 1 mg via INTRAVENOUS

## 2015-10-09 MED ORDER — PREDNISONE 20 MG PO TABS
20.0000 mg | ORAL_TABLET | Freq: Once | ORAL | Status: AC
  Administered 2015-10-09: 20 mg via ORAL
  Filled 2015-10-09: qty 1

## 2015-10-09 MED ORDER — ACETAMINOPHEN 10 MG/ML IV SOLN
INTRAVENOUS | Status: AC
  Filled 2015-10-09: qty 100

## 2015-10-09 MED ORDER — FERROUS SULFATE 325 (65 FE) MG PO TABS
325.0000 mg | ORAL_TABLET | Freq: Every day | ORAL | Status: DC
  Administered 2015-10-10 – 2015-10-11 (×2): 325 mg via ORAL
  Filled 2015-10-09 (×2): qty 1

## 2015-10-09 MED ORDER — BUPIVACAINE HCL (PF) 0.25 % IJ SOLN
INTRAMUSCULAR | Status: AC
  Filled 2015-10-09: qty 30

## 2015-10-09 MED ORDER — LACTATED RINGERS IV SOLN
INTRAVENOUS | Status: DC | PRN
  Administered 2015-10-09 (×2): via INTRAVENOUS

## 2015-10-09 MED ORDER — FENTANYL CITRATE (PF) 250 MCG/5ML IJ SOLN
INTRAMUSCULAR | Status: DC | PRN
  Administered 2015-10-09 (×2): 50 ug via INTRAVENOUS

## 2015-10-09 MED ORDER — PHENOL 1.4 % MT LIQD: 1.0000 | OROMUCOSAL | Status: DC | PRN

## 2015-10-09 MED ORDER — MORPHINE SULFATE (PF) 2 MG/ML IV SOLN
1.0000 mg | INTRAVENOUS | Status: DC | PRN
  Administered 2015-10-09 (×2): 1 mg via INTRAVENOUS
  Filled 2015-10-09 (×2): qty 1

## 2015-10-09 MED ORDER — HYDROMORPHONE HCL 1 MG/ML IJ SOLN
INTRAMUSCULAR | Status: AC
  Filled 2015-10-09: qty 1

## 2015-10-09 MED ORDER — BUPIVACAINE LIPOSOME 1.3 % IJ SUSP
INTRAMUSCULAR | Status: DC | PRN
  Administered 2015-10-09: 20 mL

## 2015-10-09 MED ORDER — SODIUM CHLORIDE 0.9 % IJ SOLN
INTRAMUSCULAR | Status: AC
  Filled 2015-10-09: qty 50

## 2015-10-09 MED ORDER — PROPOFOL 10 MG/ML IV BOLUS
INTRAVENOUS | Status: AC
  Filled 2015-10-09: qty 40

## 2015-10-09 MED ORDER — TRANEXAMIC ACID 1000 MG/10ML IV SOLN
2000.0000 mg | Freq: Once | INTRAVENOUS | Status: DC
  Filled 2015-10-09: qty 20

## 2015-10-09 MED ORDER — PROPOFOL 500 MG/50ML IV EMUL
INTRAVENOUS | Status: DC | PRN
Start: 2015-10-09 — End: 2015-10-09
  Administered 2015-10-09: 75 ug/kg/min via INTRAVENOUS

## 2015-10-09 MED ORDER — ACETAMINOPHEN 10 MG/ML IV SOLN
1000.0000 mg | Freq: Once | INTRAVENOUS | Status: AC
  Administered 2015-10-09: 1000 mg via INTRAVENOUS
  Filled 2015-10-09: qty 100

## 2015-10-09 MED ORDER — MENTHOL 3 MG MT LOZG: 1.0000 | LOZENGE | OROMUCOSAL | Status: DC | PRN

## 2015-10-09 MED ORDER — FLEET ENEMA 7-19 GM/118ML RE ENEM: 1.0000 | ENEMA | Freq: Once | RECTAL | Status: DC | PRN

## 2015-10-09 MED ORDER — CHLORHEXIDINE GLUCONATE 4 % EX LIQD: 60.0000 mL | Freq: Once | CUTANEOUS | Status: DC

## 2015-10-09 MED ORDER — OXYCODONE HCL 5 MG PO TABS
5.0000 mg | ORAL_TABLET | ORAL | Status: DC | PRN
  Administered 2015-10-10: 5 mg via ORAL
  Filled 2015-10-09: qty 1

## 2015-10-09 MED ORDER — OXYCODONE HCL 5 MG PO TABS: 5.0000 mg | ORAL_TABLET | Freq: Once | ORAL | Status: DC | PRN

## 2015-10-09 MED ORDER — SODIUM CHLORIDE 0.9 % IJ SOLN
INTRAMUSCULAR | Status: DC | PRN
  Administered 2015-10-09: 30 mL

## 2015-10-09 MED ORDER — METOCLOPRAMIDE HCL 5 MG/ML IJ SOLN: 5.0000 mg | Freq: Three times a day (TID) | INTRAMUSCULAR | Status: DC | PRN

## 2015-10-09 MED ORDER — METHOCARBAMOL 500 MG PO TABS
500.0000 mg | ORAL_TABLET | Freq: Four times a day (QID) | ORAL | Status: DC | PRN
  Administered 2015-10-10 – 2015-10-11 (×4): 500 mg via ORAL
  Filled 2015-10-09 (×4): qty 1

## 2015-10-09 MED ORDER — PROPOFOL 10 MG/ML IV BOLUS
INTRAVENOUS | Status: AC
  Filled 2015-10-09: qty 20

## 2015-10-09 MED ORDER — POLYETHYLENE GLYCOL 3350 17 G PO PACK
17.0000 g | PACK | Freq: Every day | ORAL | Status: DC | PRN
  Administered 2015-10-11: 17 g via ORAL

## 2015-10-09 MED ORDER — METHOCARBAMOL 1000 MG/10ML IJ SOLN
500.0000 mg | Freq: Four times a day (QID) | INTRAVENOUS | Status: DC | PRN
  Administered 2015-10-09: 500 mg via INTRAVENOUS
  Filled 2015-10-09: qty 550
  Filled 2015-10-09: qty 5

## 2015-10-09 MED ORDER — OXYCODONE HCL 5 MG/5ML PO SOLN: 5.0000 mg | Freq: Once | ORAL | Status: DC | PRN

## 2015-10-09 MED ORDER — METOCLOPRAMIDE HCL 5 MG PO TABS: 5.0000 mg | ORAL_TABLET | Freq: Three times a day (TID) | ORAL | Status: DC | PRN

## 2015-10-09 MED ORDER — TRAMADOL HCL 50 MG PO TABS: 50.0000 mg | ORAL_TABLET | Freq: Four times a day (QID) | ORAL | Status: DC | PRN

## 2015-10-09 SURGICAL SUPPLY — 51 items
BAG DECANTER FOR FLEXI CONT (MISCELLANEOUS) ×3 IMPLANT
BAG SPEC THK2 15X12 ZIP CLS (MISCELLANEOUS) ×1
BAG ZIPLOCK 12X15 (MISCELLANEOUS) ×3 IMPLANT
BANDAGE ACE 6X5 VEL STRL LF (GAUZE/BANDAGES/DRESSINGS) ×3 IMPLANT
BLADE SAG 18X100X1.27 (BLADE) ×3 IMPLANT
BLADE SAW SGTL 11.0X1.19X90.0M (BLADE) ×3 IMPLANT
BOWL SMART MIX CTS (DISPOSABLE) ×3 IMPLANT
CAPT KNEE TOTAL 3 ATTUNE ×2 IMPLANT
CEMENT HV SMART SET (Cement) ×6 IMPLANT
CLOSURE WOUND 1/2 X4 (GAUZE/BANDAGES/DRESSINGS) ×2
CLOTH BEACON ORANGE TIMEOUT ST (SAFETY) ×3 IMPLANT
CUFF TOURN SGL QUICK 34 (TOURNIQUET CUFF) ×3
CUFF TRNQT CYL 34X4X40X1 (TOURNIQUET CUFF) ×1 IMPLANT
DECANTER SPIKE VIAL GLASS SM (MISCELLANEOUS) ×3 IMPLANT
DRAPE U-SHAPE 47X51 STRL (DRAPES) ×3 IMPLANT
DRSG ADAPTIC 3X8 NADH LF (GAUZE/BANDAGES/DRESSINGS) ×3 IMPLANT
DRSG PAD ABDOMINAL 8X10 ST (GAUZE/BANDAGES/DRESSINGS) ×3 IMPLANT
DURAPREP 26ML APPLICATOR (WOUND CARE) ×3 IMPLANT
ELECT REM PT RETURN 9FT ADLT (ELECTROSURGICAL) ×3
ELECTRODE REM PT RTRN 9FT ADLT (ELECTROSURGICAL) ×1 IMPLANT
EVACUATOR 1/8 PVC DRAIN (DRAIN) ×3 IMPLANT
GAUZE SPONGE 4X4 12PLY STRL (GAUZE/BANDAGES/DRESSINGS) ×3 IMPLANT
GLOVE BIO SURGEON STRL SZ7.5 (GLOVE) ×2 IMPLANT
GLOVE BIO SURGEON STRL SZ8 (GLOVE) ×3 IMPLANT
GLOVE BIOGEL PI IND STRL 6.5 (GLOVE) IMPLANT
GLOVE BIOGEL PI IND STRL 8 (GLOVE) ×1 IMPLANT
GLOVE BIOGEL PI INDICATOR 6.5 (GLOVE)
GLOVE BIOGEL PI INDICATOR 8 (GLOVE) ×4
GLOVE SURG SS PI 6.5 STRL IVOR (GLOVE) IMPLANT
GOWN STRL REUS W/TWL LRG LVL3 (GOWN DISPOSABLE) ×3 IMPLANT
GOWN STRL REUS W/TWL XL LVL3 (GOWN DISPOSABLE) ×2 IMPLANT
HANDPIECE INTERPULSE COAX TIP (DISPOSABLE) ×3
IMMOBILIZER KNEE 20 (SOFTGOODS) ×3
IMMOBILIZER KNEE 20 THIGH 36 (SOFTGOODS) ×1 IMPLANT
MANIFOLD NEPTUNE II (INSTRUMENTS) ×3 IMPLANT
NS IRRIG 1000ML POUR BTL (IV SOLUTION) ×3 IMPLANT
PACK TOTAL KNEE CUSTOM (KITS) ×3 IMPLANT
PADDING CAST COTTON 6X4 STRL (CAST SUPPLIES) ×7 IMPLANT
POSITIONER SURGICAL ARM (MISCELLANEOUS) ×3 IMPLANT
SET HNDPC FAN SPRY TIP SCT (DISPOSABLE) ×1 IMPLANT
STRIP CLOSURE SKIN 1/2X4 (GAUZE/BANDAGES/DRESSINGS) ×4 IMPLANT
SUT MNCRL AB 4-0 PS2 18 (SUTURE) ×3 IMPLANT
SUT VIC AB 2-0 CT1 27 (SUTURE) ×9
SUT VIC AB 2-0 CT1 TAPERPNT 27 (SUTURE) ×3 IMPLANT
SUT VLOC 180 0 24IN GS25 (SUTURE) ×3 IMPLANT
SYR 50ML LL SCALE MARK (SYRINGE) ×3 IMPLANT
TRAY FOLEY W/METER SILVER 14FR (SET/KITS/TRAYS/PACK) ×1 IMPLANT
TRAY FOLEY W/METER SILVER 16FR (SET/KITS/TRAYS/PACK) ×3 IMPLANT
WATER STERILE IRR 1500ML POUR (IV SOLUTION) ×3 IMPLANT
WRAP KNEE MAXI GEL POST OP (GAUZE/BANDAGES/DRESSINGS) ×3 IMPLANT
YANKAUER SUCT BULB TIP 10FT TU (MISCELLANEOUS) ×3 IMPLANT

## 2015-10-09 NOTE — Transfer of Care (Signed)
Immediate Anesthesia Transfer of Care Note  Patient: David Joseph  Procedure(s) Performed: Procedure(s): LEFT TOTAL KNEE ARTHROPLASTY (Left)  Patient Location: PACU  Anesthesia Type:MAC and Spinal  Level of Consciousness: awake, alert  and patient cooperative  Airway & Oxygen Therapy: Patient Spontanous Breathing and Patient connected to face mask oxygen  Post-op Assessment: Report given to RN and Post -op Vital signs reviewed and stable  Post vital signs: Reviewed and stable  Last Vitals:  Vitals:   10/09/15 0554 10/09/15 0945  BP: 129/63   Pulse: 61 72  Resp: 18 (!) 38  Temp: 36.7 C     Last Pain:  Vitals:   10/09/15 0554  TempSrc: Oral      Patients Stated Pain Goal: 4 (16/43/53 [id number])  Complications: No apparent anesthesia complications

## 2015-10-09 NOTE — Anesthesia Postprocedure Evaluation (Signed)
Anesthesia Post Note  Patient: David Joseph  Procedure(s) Performed: Procedure(s) (LRB): LEFT TOTAL KNEE ARTHROPLASTY (Left)  Patient location during evaluation: PACU Anesthesia Type: Spinal Level of consciousness: oriented and awake and alert Pain management: pain level controlled Vital Signs Assessment: post-procedure vital signs reviewed and stable Respiratory status: spontaneous breathing, respiratory function stable and patient connected to nasal cannula oxygen Cardiovascular status: blood pressure returned to baseline and stable Postop Assessment: no headache and no backache Anesthetic complications: no    Last Vitals:  Vitals:   10/09/15 1135 10/09/15 1235  BP: 136/75 129/61  Pulse:  (!) 59  Resp: 16 18  Temp: 36.5 C 36.5 C    Last Pain:  Vitals:   10/09/15 1235  TempSrc: Oral  PainSc:                  Briscoe S

## 2015-10-09 NOTE — Progress Notes (Signed)
Patient was seen by Primary care doctor for febrile illness. Was treated by Z pack which he completed and Dr. Wynelle Link was notefied.

## 2015-10-09 NOTE — Op Note (Signed)
Pre-operative diagnosis- Osteoarthritis  Left knee(s)  Post-operative diagnosis- Osteoarthritis Left knee(s)  Procedure-  Left  Total Knee Arthroplasty (Depuy Attune)  Surgeon- Dione Plover. Sheyla Zaffino, MD  Assistant- [name], PA-C   Anesthesia-  Spinal  EBL-* No blood loss amount entered *   Drains Hemovac  Tourniquet time-  Total Tourniquet Time Documented: Thigh (Left) - 37 minutes Total: Thigh (Left) - 37 minutes     Complications- None  Condition-PACU - hemodynamically stable.   Brief Clinical Note   David Joseph is a 75 y.o. year old male with end stage OA of his left knee with progressively worsening pain and dysfunction. He has constant pain, with activity and at rest and significant functional deficits with difficulties even with ADLs. He has had extensive non-op management including analgesics, injections of cortisone and viscosupplements, and home exercise program, but remains in significant pain with significant dysfunction. Radiographs show bone on bone arthritis medial and patellofemoral. He presents now for left Total Knee Arthroplasty.     Procedure in detail---   The patient is brought into the operating room and positioned supine on the operating table. After successful administration of  Spinal,   a tourniquet is placed high on the  Left thigh(s) and the lower extremity is prepped and draped in the usual sterile fashion. Time out is performed by the operating team and then the  Left lower extremity is wrapped in Esmarch, knee flexed and the tourniquet inflated to 300 mmHg.       A midline incision is made with a ten blade through the subcutaneous tissue to the level of the extensor mechanism. A fresh blade is used to make a medial parapatellar arthrotomy. Soft tissue over the proximal medial tibia is subperiosteally elevated to the joint line with a knife and into the semimembranosus bursa with a Cobb elevator. Soft tissue over the proximal lateral tibia is elevated  with attention being paid to avoiding the patellar tendon on the tibial tubercle. The patella is everted, knee flexed 90 degrees and the ACL and PCL are removed. Findings are bone on bone medial and patellofemoral with large global osteophytes.        The drill is used to create a starting hole in the distal femur and the canal is thoroughly irrigated with sterile saline to remove the fatty contents. The 5 degree Left  valgus alignment guide is placed into the femoral canal and the distal femoral cutting block is pinned to remove 10 mm off the distal femur. Resection is made with an oscillating saw.      The tibia is subluxed forward and the menisci are removed. The extramedullary alignment guide is placed referencing proximally at the medial aspect of the tibial tubercle and distally along the second metatarsal axis and tibial crest. The block is pinned to remove 35m off the more deficient medial  side. Resection is made with an oscillating saw. Size 6is the most appropriate size for the tibia and the proximal tibia is prepared with the modular drill and keel punch for that size.      The femoral sizing guide is placed and size 7 is most appropriate. Rotation is marked off the epicondylar axis and confirmed by creating a rectangular flexion gap at 90 degrees. The size 7 cutting block is pinned in this rotation and the anterior, posterior and chamfer cuts are made with the oscillating saw. The intercondylar block is then placed and that cut is made.      Trial  size 6 tibial component, trial size 7 posterior stabilized femur and a 7  mm posterior stabilized rotating platform insert trial is placed. Full extension is achieved with excellent varus/valgus and anterior/posterior balance throughout full range of motion. The patella is everted and thickness measured to be 24  mm. Free hand resection is taken to 14 mm, a 38 template is placed, lug holes are drilled, trial patella is placed, and it tracks normally.  Osteophytes are removed off the posterior femur with the trial in place. All trials are removed and the cut bone surfaces prepared with pulsatile lavage. Cement is mixed and once ready for implantation, the size 6 tibial implant, size  7 posterior stabilized femoral component, and the size 38 patella are cemented in place and the patella is held with the clamp. The trial insert is placed and the knee held in full extension. The Exparel (20 ml mixed with 30 ml saline) and .25% Bupivicaine, are injected into the extensor mechanism, posterior capsule, medial and lateral gutters and subcutaneous tissues.  All extruded cement is removed and once the cement is hard the permanent 7 mm posterior stabilized rotating platform insert is placed into the tibial tray.      The wound is copiously irrigated with saline solution and the extensor mechanism closed over a hemovac drain with #1 V-loc suture. The tourniquet is released for a total tourniquet time of 37  minutes. Flexion against gravity is 140 degrees and the patella tracks normally. Subcutaneous tissue is closed with 2.0 vicryl and subcuticular with running 4.0 Monocryl. The incision is cleaned and dried and steri-strips and a bulky sterile dressing are applied. The limb is placed into a knee immobilizer and the patient is awakened and transported to recovery in stable condition.      Please note that a surgical assistant was a medical necessity for this procedure in order to perform it in a safe and expeditious manner. Surgical assistant was necessary to retract the ligaments and vital neurovascular structures to prevent injury to them and also necessary for proper positioning of the limb to allow for anatomic placement of the prosthesis.   Dione Plover Nhan Qualley, MD    10/09/2015, 9:14 AM

## 2015-10-09 NOTE — Evaluation (Signed)
Physical Therapy Evaluation Patient Details Name: David Joseph MRN: [id number] DOB: 1940-06-23 Today's Date: 10/09/2015   History of Present Illness  Pt is a 75 year old male s/p L TKA with hx of R TKA and revision  Clinical Impression  Pt is s/p L TKA resulting in the deficits listed below (see PT Problem List).  Pt will benefit from skilled PT to increase their independence and safety with mobility to allow discharge to the venue listed below.  Pt mobilizing well POD #0 and plans to d/c home and start with outpatient PT.     Follow Up Recommendations Outpatient PT    Equipment Recommendations  None recommended by PT    Recommendations for Other Services       Precautions / Restrictions Precautions Precautions: Fall;Knee Required Braces or Orthoses: Knee Immobilizer - Left Restrictions Other Position/Activity Restrictions: WBAT      Mobility  Bed Mobility Overal bed mobility: Needs Assistance Bed Mobility: Supine to Sit     Supine to sit: Min guard     General bed mobility comments: verbal cues for technique  Transfers Overall transfer level: Needs assistance Equipment used: Rolling walker (2 wheeled) Transfers: Sit to/from Stand Sit to Stand: Min assist         General transfer comment: verbal cues for UE and LE positioning  Ambulation/Gait Ambulation/Gait assistance: Min guard Ambulation Distance (Feet): 40 Feet Assistive device: Rolling walker (2 wheeled) Gait Pattern/deviations: Step-to pattern;Decreased stance time - left;Antalgic Gait velocity: decr   General Gait Details: verbal cues for sequence, RW positioning, step length  Stairs            Wheelchair Mobility    Modified Rankin (Stroke Patients Only)       Balance                                             Pertinent Vitals/Pain Pain Assessment: 0-10 Pain Score: 2  Pain Location: L knee Pain Descriptors / Indicators: Aching;Sore Pain Intervention(s):  Limited activity within patient's tolerance;Monitored during session;Premedicated before session;Ice applied;Repositioned    Home Living Family/patient expects to be discharged to:: Private residence Living Arrangements: Spouse/significant other   Type of Home: House Home Access: Stairs to enter Entrance Stairs-Rails: Right Entrance Stairs-Number of Steps: 4 Home Layout: One level Home Equipment: Environmental consultant - 2 wheels;Bedside commode;Toilet riser;Crutches      Prior Function Level of Independence: Independent               Hand Dominance        Extremity/Trunk Assessment               Lower Extremity Assessment: LLE deficits/detail   LLE Deficits / Details: good quad contraction, maintined KI, ROM TBA     Communication   Communication: No difficulties  Cognition Arousal/Alertness: Awake/alert Behavior During Therapy: WFL for tasks assessed/performed Overall Cognitive Status: Within Functional Limits for tasks assessed                      General Comments      Exercises        Assessment/Plan    PT Assessment Patient needs continued PT services  PT Diagnosis Difficulty walking;Acute pain   PT Problem List Decreased strength;Decreased range of motion;Decreased knowledge of use of DME;Pain  PT Treatment Interventions Functional mobility training;Stair training;DME instruction;Gait training;Therapeutic exercise;Therapeutic  activities;Patient/family education   PT Goals (Current goals can be found in the Care Plan section) Acute Rehab PT Goals PT Goal Formulation: With patient Time For Goal Achievement: 10/13/15 Potential to Achieve Goals: Good    Frequency 7X/week   Barriers to discharge        Co-evaluation               End of Session Equipment Utilized During Treatment: Gait belt;Left knee immobilizer Activity Tolerance: Patient tolerated treatment well Patient left: in chair;with call bell/phone within reach;with chair alarm  set           Time: 9122-5834 PT Time Calculation (min) (ACUTE ONLY): 16 min   Charges:   PT Evaluation $PT Eval Low Complexity: 1 Procedure     PT G Codes:        David Joseph,[name] 10/09/2015, 4:39 PM Carmelia Bake, PT, DPT 10/09/2015 Pager: [phone]

## 2015-10-09 NOTE — H&P (View-Only) (Signed)
David Joseph DOB: 09-24-1940 Married / Language: English / Race: White Male Date of Admission:  10/09/2015 CC:  Left Knee Pain History of Present Illness The patient is a 75 year old male who comes in  for a preoperative History and Physical. The patient is scheduled for a left total knee arthroplasty to be performed by Dr. Dione Plover. Aluisio, MD at [facility] on 10/09/2015. The patient is a 75 year old male who presented for follow up of their knee. The patient is being followed for their left knee pain and osteoarthritis. They are now out from Euflexxa series. Symptoms reported include: pain, swelling (radiating down leg; into ankle), aching, giving way, instability and difficulty ambulating. The patient feels that they are doing poorly. The patient has not gotten any relief of their symptoms with viscosupplementation. Unfortunately, Euflexxa injections did not provide much benefit. He still has persistent pain and swelling in that left knee. 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 He is about to have cervical surgery with Dr. Glenna Fellows. He feels that the left knee, however, is getting progressively worse. He has reached a point where he would like to get the knee replaced.  They have been treated conservatively in the past for the above stated problem and despite conservative measures, they continue to have progressive pain and severe functional limitations and dysfunction. They have failed non-operative management including home exercise, medications, and injections. It is felt that they would benefit from undergoing total joint replacement. Risks and benefits of the procedure have been discussed with the patient and they elect to proceed with surgery. There are no active contraindications to surgery such as ongoing infection or rapidly progressive neurological disease.  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)  Left knee pain (M25.562)  Diverticulitis Of Colon  Degenerative Disc Disease  Diverticulosis  Pneumonia  Osteoarthritis  Skin Cancer  Primary osteoarthritis of left knee (M17.12)  Prostate Cancer  Allergies IV Contrast  Family History  Osteoarthritis  mother First Degree Relatives  Cancer  grandmother mothers side Heart Disease  mother  Social History Number of flights of stairs before winded  4-5 Marital status  married Living situation  live with spouse Pain Contract  no Tobacco use  never smoker Tobacco / smoke exposure  no Current work status  retired Careers information officer  2 Alcohol use  current drinker; drinks wine; 5-7 per week Drug/Alcohol Rehab (Currently)  no Illicit drug use  no Exercise  Exercises rarely; does individual sport Drug/Alcohol Rehab (Previously)  no Post-Surgical Plans  Plan is to go home following the Left TKA to be performed on 10/09/2015. Advance Directives  Healthcare POA  Medication History  PredniSONE ('20MG'$  Tablet, Oral) Active. Iron (Ferrous Gluconate) (Oral) Specific strength unknown - Active. Folic Acid ('1MG'$  Tablet, Oral) Active. Methotrexate (2.'5MG'$  Tablet, 6 Oral every Monday) Active.  Past Surgical History  Tonsillectomy  Spinal Surgery  Date: 29. L5-S1 Cataract Surgery  Date: 2010. bilateral Finger Surgery  Date: 2012. Total Knee Replacement  Date: 06/07/2010. right Prostate Surgery - Removal  Date: 12/2013. Cervical Fusion  Date: 02/2015.  Review of Systems General Not Present- Chills, Fatigue, Fever, Memory Loss, Night Sweats, Weight Gain and Weight Loss. Skin Not Present- Eczema, Hives, Itching, Lesions and Rash. HEENT Present- Hearing Loss. Not Present- Dentures, Double Vision, Headache, Tinnitus and  Visual Loss. Respiratory Not Present- Allergies,  Chronic Cough, Coughing up blood, Shortness of breath at rest and Shortness of breath with exertion. Cardiovascular Not Present- Chest Pain, Difficulty Breathing Lying Down, Murmur, Palpitations, Racing/skipping heartbeats and Swelling. Gastrointestinal Not Present- Abdominal Pain, Bloody Stool, Constipation, Diarrhea, Difficulty Swallowing, Heartburn, Jaundice, Loss of appetitie, Nausea and Vomiting. Male Genitourinary Not Present- Blood in Urine, Discharge, Flank Pain, Incontinence, Painful Urination, Urgency, Urinary frequency, Urinary Retention, Urinating at Night and Weak urinary stream. Musculoskeletal Present- Joint Pain, Joint Swelling and Morning Stiffness. Not Present- Back Pain, Muscle Pain, Muscle Weakness and Spasms. Neurological Not Present- Blackout spells, Difficulty with balance, Dizziness, Paralysis, Tremor and Weakness. Psychiatric Not Present- Insomnia.  Vitals Weight: 177 lb Height: 69in Weight was reported by patient. Height was reported by patient. Body Surface Area: 1.96 m Body Mass Index: 26.14 kg/m  Pulse: 68 (Regular)  BP: 110/46 (Sitting, Right Arm, Standard)   Physical Exam General Mental Status -Alert, cooperative and good historian. General Appearance-pleasant, Not in acute distress. Orientation-Oriented X3. Build & Nutrition-Well nourished and Well developed.  Head and Neck Head-normocephalic, atraumatic . Neck Global Assessment - supple, no bruit auscultated on the right, no bruit auscultated on the left.  Eye Vision-Wears corrective lenses(readers). Pupil - Bilateral-Regular and Round. Motion - Bilateral-EOMI.  Chest and Lung Exam Auscultation Breath sounds - clear at anterior chest wall and clear at posterior chest wall. Adventitious sounds - No Adventitious sounds.  Cardiovascular Auscultation Rhythm - Regular rate and rhythm. Heart Sounds - S1 WNL and S2 WNL. Murmurs & Other Heart Sounds - Auscultation of the  heart reveals - No Murmurs.  Abdomen Palpation/Percussion Tenderness - Abdomen is non-tender to palpation. Rigidity (guarding) - Abdomen is soft. Auscultation Auscultation of the abdomen reveals - Bowel sounds normal.  Male Genitourinary Note: Not done, not pertinent to present illness   Musculoskeletal Note: On exam, he is alert and oriented, in no apparent distress. His left hip has normal motion with no discomfort. His left knee shows a moderate effusion. His range of motion is about 5 to 125. He is tender medially. There is some lateral tenderness also. There is no instability noted. Pulses, sensation and motor are intact.  RADIOGRAPHS We reviewed his radiographs, AP and lateral of the knee, showing his revision prosthesis on the right in excellent position with no abnormalities. On the left, he has got bone on bone arthritis medially as well as patellofemoral bone on bone.  Assessment & Plan Primary osteoarthritis of left knee (M17.12)  Note:Surgical Plans: Left Total Knee Replacement  Disposition: Home  PCP: Dr. Nadara Mustard - Patient has been seen preoperatively and felt to be stable for surgery. "Patient is cleared for surgery with the recommendations of 6 weeks postop Xarelto 10 mg."  Topical TXA - History of DVT  Anesthesia Issues: None  Signed electronically by Ok Edwards, III PA-C

## 2015-10-09 NOTE — Anesthesia Preprocedure Evaluation (Signed)
Anesthesia Evaluation  Patient identified by MRN, date of birth, ID band Patient awake    Reviewed: Allergy & Precautions, NPO status , Patient's Chart, lab work & pertinent test results  Airway Mallampati: II   Neck ROM: full    Dental   Pulmonary former smoker,    breath sounds clear to auscultation       Cardiovascular + DVT   Rhythm:regular Rate:Normal  Stopped his Xarelto over a week ago.   Neuro/Psych    GI/Hepatic   Endo/Other    Renal/GU      Musculoskeletal  (+) Arthritis ,   Abdominal   Peds  Hematology   Anesthesia Other Findings   Reproductive/Obstetrics                             Anesthesia Physical Anesthesia Plan  ASA: II  Anesthesia Plan: MAC and Spinal   Post-op Pain Management:    Induction: Intravenous  Airway Management Planned: Simple Face Mask  Additional Equipment:   Intra-op Plan:   Post-operative Plan:   Informed Consent: I have reviewed the patients History and Physical, chart, labs and discussed the procedure including the risks, benefits and alternatives for the proposed anesthesia with the patient or authorized representative who has indicated his/her understanding and acceptance.     Plan Discussed with: CRNA, Anesthesiologist and Surgeon  Anesthesia Plan Comments:         Anesthesia Quick Evaluation

## 2015-10-09 NOTE — Anesthesia Procedure Notes (Signed)
Spinal  Patient location during procedure: OR Start time: 10/09/2015 8:03 AM End time: 10/09/2015 8:07 AM Staffing Anesthesiologist: Marcie Bal, Lenae Wherley Performed: anesthesiologist  Preanesthetic Checklist Completed: patient identified, site marked, surgical consent, pre-op evaluation, timeout performed, IV checked, risks and benefits discussed and monitors and equipment checked Spinal Block Patient position: sitting Prep: ChloraPrep and site prepped and draped Patient monitoring: heart rate, cardiac monitor, continuous pulse ox and blood pressure Approach: midline Location: L3-4 Injection technique: single-shot Needle Needle type: Pencan  Needle gauge: 24 G Needle length: 10 cm Assessment Sensory level: T8 Additional Notes Pt tolerated the procedure well.

## 2015-10-09 NOTE — Interval H&P Note (Signed)
History and Physical Interval Note:  10/09/2015 6:47 AM  David Joseph  has presented today for surgery, with the diagnosis of OA LEFT KNEE   The various methods of treatment have been discussed with the patient and family. After consideration of risks, benefits and other options for treatment, the patient has consented to  Procedure(s): LEFT TOTAL KNEE ARTHROPLASTY (Left) as a surgical intervention .  The patient's history has been reviewed, patient examined, no change in status, stable for surgery.  I have reviewed the patient's chart and labs.  Questions were answered to the patient's satisfaction.     [name]

## 2015-10-10 LAB — CBC
HCT: 26.8 % — ABNORMAL LOW (ref 39.0–52.0)
Hemoglobin: 9.3 g/dL — ABNORMAL LOW (ref 13.0–17.0)
MCH: 36.3 pg — ABNORMAL HIGH (ref 26.0–34.0)
MCHC: 34.7 g/dL (ref 30.0–36.0)
MCV: 104.7 fL — AB (ref 78.0–100.0)
PLATELETS: 269 10*3/uL (ref 150–400)
RBC: 2.56 MIL/uL — AB (ref 4.22–5.81)
RDW: 12.7 % (ref 11.5–15.5)
WBC: 10.3 10*3/uL (ref 4.0–10.5)

## 2015-10-10 LAB — BASIC METABOLIC PANEL
ANION GAP: 5 (ref 5–15)
BUN: 11 mg/dL (ref 6–20)
CALCIUM: 8.6 mg/dL — AB (ref 8.9–10.3)
CO2: 28 mmol/L (ref 22–32)
Chloride: 106 mmol/L (ref 101–111)
Creatinine, Ser: 0.68 mg/dL (ref 0.61–1.24)
GLUCOSE: 159 mg/dL — AB (ref 65–99)
POTASSIUM: 4.1 mmol/L (ref 3.5–5.1)
Sodium: 139 mmol/L (ref 135–145)

## 2015-10-10 MED ORDER — TRAMADOL HCL 50 MG PO TABS: 50.0000 mg | ORAL_TABLET | Freq: Four times a day (QID) | ORAL | 1 refills | Status: DC | PRN

## 2015-10-10 MED ORDER — METHOCARBAMOL 500 MG PO TABS: 500.0000 mg | ORAL_TABLET | Freq: Four times a day (QID) | ORAL | 0 refills | Status: DC | PRN

## 2015-10-10 MED ORDER — RIVAROXABAN 20 MG PO TABS: 20.0000 mg | ORAL_TABLET | Freq: Every day | ORAL | 0 refills | Status: DC

## 2015-10-10 MED ORDER — OXYCODONE HCL 5 MG PO TABS: 5.0000 mg | ORAL_TABLET | ORAL | 0 refills | Status: DC | PRN

## 2015-10-10 MED ORDER — SODIUM CHLORIDE 0.9 % IV BOLUS (SEPSIS)
250.0000 mL | Freq: Once | INTRAVENOUS | Status: AC
  Administered 2015-10-10: 1000 mL via INTRAVENOUS

## 2015-10-10 NOTE — Evaluation (Signed)
Occupational Therapy Evaluation Patient Details Name: David Joseph MRN: [id number] DOB: Jun 21, 1940 Today's Date: 10/10/2015    History of Present Illness s/p L TKA   Clinical Impression   This 75 year old man was admitted for the above sx.  Wife will assist as needed at home and he has DME.   All education was completed. No further OT needs at this time    Follow Up Recommendations  No OT follow up    Equipment Recommendations  None recommended by OT    Recommendations for Other Services       Precautions / Restrictions Precautions Precautions: Fall;Knee Precaution Comments: able to perform SLR Restrictions Weight Bearing Restrictions: No Other Position/Activity Restrictions: WBAT      Mobility Bed Mobility Overal bed mobility: Needs Assistance Bed Mobility: Sit to Supine     Supine to sit: Min guard Sit to supine: Min guard   General bed mobility comments: for safety  Transfers Overall transfer level: Needs assistance Equipment used: Rolling walker (2 wheeled) Transfers: Sit to/from Stand Sit to Stand: Min guard         General transfer comment: followed through with UE/LE placement from last session; min guard for safety    Balance                                            ADL Overall ADL's : Needs assistance/impaired     Grooming: Wash/dry hands;Supervision/safety;Standing                   Toilet Transfer: Min guard;Comfort height toilet;BSC   Toileting- Water quality scientist and Hygiene: Min guard;Sit to/from stand         General ADL Comments: ambulated to bathroom.  Urinated standing; cues for safety with RW as he tends to "park  it " to side.  Did not feel he needed to practice shower transfer.       Vision     Perception     Praxis      Pertinent Vitals/Pain Pain Assessment: 0-10 Pain Score: 4  Pain Location: L knee Pain Descriptors / Indicators: Spasm;Aching Pain Intervention(s): Limited  activity within patient's tolerance;Monitored during session;Repositioned;Patient requesting pain meds-RN notified     Hand Dominance     Extremity/Trunk Assessment Upper Extremity Assessment Upper Extremity Assessment: Overall WFL for tasks assessed           Communication Communication Communication: No difficulties   Cognition Arousal/Alertness: Awake/alert Behavior During Therapy: WFL for tasks assessed/performed Overall Cognitive Status: Within Functional Limits for tasks assessed                     General Comments       Exercises       Shoulder Instructions      Home Living Family/patient expects to be discharged to:: Private residence Living Arrangements: Spouse/significant other Available Help at Discharge: Family               Bathroom Shower/Tub: Walk-in Psychologist, prison and probation services: Standard     Home Equipment: Environmental consultant - 2 wheels;Bedside commode;Toilet riser;Crutches          Prior Functioning/Environment Level of Independence: Independent             OT Diagnosis: Acute pain   OT Problem List:     OT Treatment/Interventions:  OT Goals(Current goals can be found in the care plan section)    OT Frequency:     Barriers to D/C:            Co-evaluation              End of Session    Activity Tolerance: Patient tolerated treatment well Patient left: in bed;with call bell/phone within reach;with bed alarm set   Time: 1254-1312 OT Time Calculation (min): 18 min Charges:  OT General Charges $OT Visit: 1 Procedure OT Evaluation $OT Eval Low Complexity: 1 Procedure G-Codes:    Seyon Strader Oct 12, 2015, 1:25 PM Lesle Chris, OTR/L [phone] 10/12/2015

## 2015-10-10 NOTE — Progress Notes (Signed)
Physical Therapy Treatment Patient Details Name: LADISLAUS REPSHER MRN: [id number] DOB: 1940-10-18 Today's Date: 10/10/2015    History of Present Illness s/p L TKA    PT Comments    The patient is progressing well. Plans DC tomorrow.  Follow Up Recommendations  Outpatient PT     Equipment Recommendations  None recommended by PT    Recommendations for Other Services       Precautions / Restrictions Precautions Precautions: Fall;Knee Precaution Comments: able to perform SLR, wore KI for steps Required Braces or Orthoses: Knee Immobilizer - Left Restrictions Weight Bearing Restrictions: No Other Position/Activity Restrictions: WBAT    Mobility  Bed Mobility Overal bed mobility: Needs Assistance Bed Mobility: Supine to Sit;Sit to Supine     Supine to sit: Min guard Sit to supine: Min guard   General bed mobility comments: for safety  Transfers Overall transfer level: Needs assistance Equipment used: Rolling walker (2 wheeled) Transfers: Sit to/from Stand Sit to Stand: Supervision         General transfer comment: proper hand  position  Ambulation/Gait Ambulation/Gait assistance: Supervision Ambulation Distance (Feet): 330 Feet Assistive device: Rolling walker (2 wheeled) Gait Pattern/deviations: Step-through pattern     General Gait Details: verbal cues for sequence, RW positioning, step length   Stairs Stairs: Yes Stairs assistance: Min guard Stair Management: One rail Left;Forwards;With crutches Number of Stairs: 4 General stair comments: , cues for sequence and safety, attempted x 1 to place crutch up to step before  stepping up to step  Wheelchair Mobility    Modified Rankin (Stroke Patients Only)       Balance                                    Cognition Arousal/Alertness: Awake/alert Behavior During Therapy: WFL for tasks assessed/performed Overall Cognitive Status: Within Functional Limits for tasks assessed                      Exercises    General Comments        Pertinent Vitals/Pain Pain Assessment: 0-10 Pain Score: 2  Pain Location: L knee Pain Descriptors / Indicators: Tightness;Sore Pain Intervention(s): Monitored during session;Premedicated before session, ice applied    Home Living Family/patient expects to be discharged to:: Private residence Living Arrangements: Spouse/significant other Available Help at Discharge: Family         Home Equipment: Gilford Rile - 2 wheels;Bedside commode;Toilet riser;Crutches      Prior Function Level of Independence: Independent          PT Goals (current goals can now be found in the care plan section) Progress towards PT goals: Progressing toward goals    Frequency  7X/week    PT Plan Current plan remains appropriate    Co-evaluation             End of Session Equipment Utilized During Treatment: Left knee immobilizer Activity Tolerance: Patient tolerated treatment well Patient left: in bed;with call bell/phone within reach;with SCD's reapplied;with family/visitor present     Time: 4315-4008 PT Time Calculation (min) (ACUTE ONLY): 19 min  Charges:  $Gait Training: 8-22 mins                    G Codes:      Claretha Cooper 10/10/2015, 2:32 PM Tresa Endo PT [phone]

## 2015-10-10 NOTE — Discharge Instructions (Signed)
° °Dr. Frank Aluisio °Total Joint Specialist °[facility] °[address] °[place], [place] [address] °[phone] ° °TOTAL KNEE REPLACEMENT POSTOPERATIVE DIRECTIONS ° °Knee Rehabilitation, Guidelines Following Surgery  °Results after knee surgery are often greatly improved when you follow the exercise, range of motion and muscle strengthening exercises prescribed by your doctor. Safety measures are also important to protect the knee from further injury. Any time any of these exercises cause you to have increased pain or swelling in your knee joint, decrease the amount until you are comfortable again and slowly increase them. If you have problems or questions, call your caregiver or physical therapist for advice.  ° °HOME CARE INSTRUCTIONS  °Remove items at home which could result in a fall. This includes throw rugs or furniture in walking pathways.  °· ICE to the affected knee every three hours for 30 minutes at a time and then as needed for pain and swelling.  Continue to use ice on the knee for pain and swelling from surgery. You may notice swelling that will progress down to the foot and ankle.  This is normal after surgery.  Elevate the leg when you are not up walking on it.   °· Continue to use the breathing machine which will help keep your temperature down.  It is common for your temperature to cycle up and down following surgery, especially at night when you are not up moving around and exerting yourself.  The breathing machine keeps your lungs expanded and your temperature down. °· Do not place pillow under knee, focus on keeping the knee straight while resting ° °DIET °You may resume your previous home diet once your are discharged from the hospital. ° °DRESSING / WOUND CARE / SHOWERING °You may shower 3 days after surgery, but keep the wounds dry during showering.  You may use an occlusive plastic wrap (Press'n Seal for example), NO SOAKING/SUBMERGING IN THE BATHTUB.  If the  bandage gets wet, change with a clean dry gauze.  If the incision gets wet, pat the wound dry with a clean towel. °You may start showering once you are discharged home but do not submerge the incision under water. Just pat the incision dry and apply a dry gauze dressing on daily. °Change the surgical dressing daily and reapply a dry dressing each time. ° °ACTIVITY °Walk with your walker as instructed. °Use walker as long as suggested by your caregivers. °Avoid periods of inactivity such as sitting longer than an hour when not asleep. This helps prevent blood clots.  °You may resume a sexual relationship in one month or when given the OK by your doctor.  °You may return to work once you are cleared by your doctor.  °Do not drive a car for 6 weeks or until released by you surgeon.  °Do not drive while taking narcotics. ° °WEIGHT BEARING °Weight bearing as tolerated with assist device (walker, cane, etc) as directed, use it as long as suggested by your surgeon or therapist, typically at least 4-6 weeks. ° °POSTOPERATIVE CONSTIPATION PROTOCOL °Constipation - defined medically as fewer than three stools per week and severe constipation as less than one stool per week. ° °One of the most common issues patients have following surgery is constipation.  Even if you have a regular bowel pattern at home, your normal regimen is likely to be disrupted due to multiple reasons following surgery.  Combination of anesthesia, postoperative narcotics, change in appetite and fluid intake all can affect your bowels.    In order to avoid complications following surgery, here are some recommendations in order to help you during your recovery period. ° °Colace (docusate) - Pick up an over-the-counter form of Colace or another stool softener and take twice a day as long as you are requiring postoperative pain medications.  Take with a full glass of water daily.  If you experience loose stools or diarrhea, hold the colace until you stool forms  back up.  If your symptoms do not get better within 1 week or if they get worse, check with your doctor. ° °Dulcolax (bisacodyl) - Pick up over-the-counter and take as directed by the product packaging as needed to assist with the movement of your bowels.  Take with a full glass of water.  Use this product as needed if not relieved by Colace only.  ° °MiraLax (polyethylene glycol) - Pick up over-the-counter to have on hand.  MiraLax is a solution that will increase the amount of water in your bowels to assist with bowel movements.  Take as directed and can mix with a glass of water, juice, soda, coffee, or tea.  Take if you go more than two days without a movement. °Do not use MiraLax more than once per day. Call your doctor if you are still constipated or irregular after using this medication for 7 days in a row. ° °If you continue to have problems with postoperative constipation, please contact the office for further assistance and recommendations.  If you experience "the worst abdominal pain ever" or develop nausea or vomiting, please contact the office immediatly for further recommendations for treatment. ° °ITCHING ° If you experience itching with your medications, try taking only a single pain pill, or even half a pain pill at a time.  You can also use Benadryl over the counter for itching or also to help with sleep.  ° °TED HOSE STOCKINGS °Wear the elastic stockings on both legs for three weeks following surgery during the day but you may remove then at night for sleeping. ° °MEDICATIONS °See your medication summary on the “After Visit Summary” that the nursing staff will review with you prior to discharge.  You may have some home medications which will be placed on hold until you complete the course of blood thinner medication.  It is important for you to complete the blood thinner medication as prescribed by your surgeon.  Continue your approved medications as instructed at time of  discharge. ° °PRECAUTIONS °If you experience chest pain or shortness of breath - call 911 immediately for transfer to the hospital emergency department.  °If you develop a fever greater that 101 F, purulent drainage from wound, increased redness or drainage from wound, foul odor from the wound/dressing, or calf pain - CONTACT YOUR SURGEON.   °                                                °FOLLOW-UP APPOINTMENTS °Make sure you keep all of your appointments after your operation with your surgeon and caregivers. You should call the office at the above phone number and make an appointment for approximately two weeks after the date of your surgery or on the date instructed by your surgeon outlined in the "After Visit Summary". ° ° °RANGE OF MOTION AND STRENGTHENING EXERCISES  °Rehabilitation of the knee is important following a knee injury or   an operation. After just a few days of immobilization, the muscles of the thigh which control the knee become weakened and shrink (atrophy). Knee exercises are designed to build up the tone and strength of the thigh muscles and to improve knee motion. Often times heat used for twenty to thirty minutes before working out will loosen up your tissues and help with improving the range of motion but do not use heat for the first two weeks following surgery. These exercises can be done on a training (exercise) mat, on the floor, on a table or on a bed. Use what ever works the best and is most comfortable for you Knee exercises include:  Leg Lifts - While your knee is still immobilized in a splint or cast, you can do straight leg raises. Lift the leg to 60 degrees, hold for 3 sec, and slowly lower the leg. Repeat 10-20 times 2-3 times daily. Perform this exercise against resistance later as your knee gets better.  Quad and Hamstring Sets - Tighten up the muscle on the front of the thigh (Quad) and hold for 5-10 sec. Repeat this 10-20 times hourly. Hamstring sets are done by pushing the  foot backward against an object and holding for 5-10 sec. Repeat as with quad sets.   Leg Slides: Lying on your back, slowly slide your foot toward your buttocks, bending your knee up off the floor (only go as far as is comfortable). Then slowly slide your foot back down until your leg is flat on the floor again.  Angel Wings: Lying on your back spread your legs to the side as far apart as you can without causing discomfort.  A rehabilitation program following serious knee injuries can speed recovery and prevent re-injury in the future due to weakened muscles. Contact your doctor or a physical therapist for more information on knee rehabilitation.   IF YOU ARE TRANSFERRED TO A SKILLED REHAB FACILITY If the patient is transferred to a skilled rehab facility following release from the hospital, a list of the current medications will be sent to the facility for the patient to continue.  When discharged from the skilled rehab facility, please have the facility set up the patient's [place] prior to being released. Also, the skilled facility will be responsible for providing the patient with their medications at time of release from the facility to include their pain medication, the muscle relaxants, and their blood thinner medication. If the patient is still at the rehab facility at time of the two week follow up appointment, the skilled rehab facility will also need to assist the patient in arranging follow up appointment in our office and any transportation needs.  MAKE SURE YOU:  Understand these instructions.  Get help right away if you are not doing well or get worse.    Pick up stool softner and laxative for home use following surgery while on pain medications. Do not submerge incision under water. Please use good hand washing techniques while changing dressing each day. May shower starting three days after surgery. Please use a clean towel to pat the incision dry following  showers. Continue to use ice for pain and swelling after surgery. Do not use any lotions or creams on the incision until instructed by your surgeon.  Take Xarelto daily for six weeks and then discontinue the Xarelto.

## 2015-10-10 NOTE — Progress Notes (Signed)
Physical Therapy Treatment Patient Details Name: David Joseph MRN: [id number] DOB: 11/13/1940 Today's Date: 2015-10-13    History of Present Illness Pt is a 75 year old male s/p L TKA with hx of R TKA and revision    PT Comments    Pt ambulated good distance and then assisted back to bed.  Pt performed exercises once in supine.  Pt reports plan to d/c home tomorrow.  Follow Up Recommendations  Outpatient PT     Equipment Recommendations  None recommended by PT    Recommendations for Other Services       Precautions / Restrictions Precautions Precautions: Fall;Knee Precaution Comments: able to perform SLR Restrictions Other Position/Activity Restrictions: WBAT    Mobility  Bed Mobility Overal bed mobility: Needs Assistance Bed Mobility: Sit to Supine       Sit to supine: Min guard      Transfers Overall transfer level: Needs assistance Equipment used: Rolling walker (2 wheeled) Transfers: Sit to/from Stand Sit to Stand: Min guard         General transfer comment: verbal cues for UE and LE positioning  Ambulation/Gait Ambulation/Gait assistance: Min guard Ambulation Distance (Feet): 200 Feet Assistive device: Rolling walker (2 wheeled) Gait Pattern/deviations: Step-through pattern;Decreased stance time - left;Decreased step length - right     General Gait Details: verbal cues for sequence, RW positioning, step length   Stairs            Wheelchair Mobility    Modified Rankin (Stroke Patients Only)       Balance                                    Cognition Arousal/Alertness: Awake/alert Behavior During Therapy: WFL for tasks assessed/performed Overall Cognitive Status: Within Functional Limits for tasks assessed                      Exercises Total Joint Exercises Ankle Circles/Pumps: AROM;Both;10 reps Quad Sets: AROM;Left;10 reps Short Arc Quad: AROM;Left;10 reps Heel Slides: AAROM;Left;10 reps Hip  ABduction/ADduction: AROM;Left;10 reps Straight Leg Raises: AROM;Left;10 reps Goniometric ROM: L knee AAROM flexion 50*    General Comments        Pertinent Vitals/Pain Pain Assessment: 0-10 Pain Score: 3  Pain Location: L knee Pain Descriptors / Indicators: Aching;Sore Pain Intervention(s): Monitored during session;Limited activity within patient's tolerance;Repositioned    Home Living                      Prior Function            PT Goals (current goals can now be found in the care plan section) Progress towards PT goals: Progressing toward goals    Frequency  7X/week    PT Plan Current plan remains appropriate    Co-evaluation             End of Session   Activity Tolerance: Patient tolerated treatment well Patient left: in bed;with call bell/phone within reach;with SCD's reapplied;with family/visitor present     Time: 6283-1517 PT Time Calculation (min) (ACUTE ONLY): 29 min  Charges:  $Gait Training: 8-22 mins $Therapeutic Exercise: 8-22 mins                    G Codes:      Jaivion Kingsley,[name] 2015/10/13, 12:56 PM Carmelia Bake, PT, DPT October 13, 2015 Pager: [phone]

## 2015-10-10 NOTE — Progress Notes (Addendum)
   Subjective: 1 Day Post-Op Procedure(s) (LRB): LEFT TOTAL KNEE ARTHROPLASTY (Left) Patient reports pain as mild.   Patient seen in rounds with Dr. Wynelle Link. Patient is well, and has had no acute complaints or problems We will start therapy today.  Plan is to go Home after hospital stay.  Objective: Vital signs in last 24 hours: Temp:  [97.4 F (36.3 C)-98.6 F (37 C)] 98.6 F (37 C) (07/25 0920) Pulse Rate:  [51-74] 69 (07/25 0920) Resp:  [11-18] 16 (07/25 0920) BP: (109-155)/(55-80) 109/58 (07/25 0920) SpO2:  [96 %-100 %] 98 % (07/25 0920)  Intake/Output from previous day:  Intake/Output Summary (Last 24 hours) at 10/10/15 0921 Last data filed at 10/10/15 0548  Gross per 24 hour  Intake             1280 ml  Output             3040 ml  Net            -1760 ml    Intake/Output this shift: No intake/output data recorded.  Labs:  Recent Labs  10/10/15 0414  HGB 9.3*    Recent Labs  10/10/15 0414  WBC 10.3  RBC 2.56*  HCT 26.8*  PLT 269    Recent Labs  10/10/15 0414  NA 139  K 4.1  CL 106  CO2 28  BUN 11  CREATININE 0.68  GLUCOSE 159*  CALCIUM 8.6*    Recent Labs  10/09/15 0640  INR 1.04    EXAM General - Patient is Alert, Appropriate and Oriented Extremity - Neurovascular intact Sensation intact distally Dorsiflexion/Plantar flexion intact Dressing - dressing C/D/I Motor Function - intact, moving foot and toes well on exam.  Hemovac pulled without difficulty.  Past Medical History:  Diagnosis Date  . Anemia   . Arthritis    OA  . Diverticulitis    10 years ago  . DVT (deep venous thrombosis) ([place]) 04/09/2015   left leg- recently  . Pneumonia 13 years ago    Assessment/Plan: 1 Day Post-Op Procedure(s) (LRB): LEFT TOTAL KNEE ARTHROPLASTY (Left) Principal Problem:   OA (osteoarthritis) of knee  Estimated body mass index is 26.14 kg/m as calculated from the following:   Height as of this encounter: '5\' 9"'$  (1.753 m).   Weight  as of this encounter: 80.3 kg (177 lb). Advance diet Up with therapy Plan for discharge tomorrow Discharge home.  Plan is to go straight to outpatient therapy on Friday 10/13/2015.  DVT Prophylaxis - Xarelto Weight-Bearing as tolerated to left leg D/C O2 and Pulse OX and try on Room Air  [name], PA-C Orthopaedic Surgery 10/10/2015, 9:21 AM

## 2015-10-10 NOTE — Discharge Summary (Signed)
Physician Discharge Summary   Patient ID: David Joseph MRN: [id number] DOB/AGE: 73-31-1942 73 y.o.  Admit date: 10/09/2015 Discharge date: 10/11/2015  Primary Diagnosis:  Osteoarthritis  Left knee(s)  Admission Diagnoses:  Past Medical History:  Diagnosis Date  . Anemia   . Arthritis    OA  . Diverticulitis    10 years ago  . DVT (deep venous thrombosis) ([place]) 04/09/2015   left leg- recently  . Pneumonia 13 years ago   Discharge Diagnoses:   Principal Problem:   OA (osteoarthritis) of knee  Estimated body mass index is 26.14 kg/m as calculated from the following:   Height as of this encounter: _0  (1.753 m).   Weight as of this encounter: 80.3 kg (177 lb).  Procedure:  Procedure(s) (LRB): LEFT TOTAL KNEE ARTHROPLASTY (Left)   Consults: None  HPI: DEMARUS Joseph is a 75 y.o. year old male with end stage OA of his left knee with progressively worsening pain and dysfunction. He has constant pain, with activity and at rest and significant functional deficits with difficulties even with ADLs. He has had extensive non-op management including analgesics, injections of cortisone and viscosupplements, and home exercise program, but remains in significant pain with significant dysfunction. Radiographs show bone on bone arthritis medial and patellofemoral. He presents now for left Total Knee Arthroplasty.   Laboratory Data: Admission on 10/09/2015  Component Date Value Ref Range Status  . ABO/RH(D) 10/09/2015 A POS   Final  . Antibody Screen 10/09/2015 NEG   Final  . Sample Expiration 10/09/2015 10/12/2015   Final  . Prothrombin Time 10/09/2015 13.8  11.6 - 15.2 seconds Final  . INR 10/09/2015 1.04  0.00 - 1.49 Final  . WBC 10/10/2015 10.3  4.0 - 10.5 K/uL Final  . RBC 10/10/2015 2.56* 4.22 - 5.81 MIL/uL Final  . Hemoglobin 10/10/2015 9.3* 13.0 - 17.0 g/dL Final  . HCT 10/10/2015 26.8* 39.0 - 52.0 % Final  . MCV 10/10/2015 104.7* 78.0 - 100.0 fL Final  . MCH 10/10/2015  36.3* 26.0 - 34.0 pg Final  . MCHC 10/10/2015 34.7  30.0 - 36.0 g/dL Final  . RDW 10/10/2015 12.7  11.5 - 15.5 % Final  . Platelets 10/10/2015 269  150 - 400 K/uL Final  . Sodium 10/10/2015 139  135 - 145 mmol/L Final  . Potassium 10/10/2015 4.1  3.5 - 5.1 mmol/L Final  . Chloride 10/10/2015 106  101 - 111 mmol/L Final  . CO2 10/10/2015 28  22 - 32 mmol/L Final  . Glucose, Bld 10/10/2015 159* 65 - 99 mg/dL Final  . BUN 10/10/2015 11  6 - 20 mg/dL Final  . Creatinine, Ser 10/10/2015 0.68  0.61 - 1.24 mg/dL Final  . Calcium 10/10/2015 8.6* 8.9 - 10.3 mg/dL Final  . GFR calc non Af Amer 10/10/2015 >60  >60 mL/min Final  . GFR calc Af Amer 10/10/2015 >60  >60 mL/min Final   Comment: (NOTE) The eGFR has been calculated using the CKD EPI equation. This calculation has not been validated in all clinical situations. eGFR's persistently <60 mL/min signify possible Chronic Kidney Disease.   Georgiann Hahn gap 10/10/2015 5  5 - 15 Final  Hospital Outpatient Visit on 09/26/2015  Component Date Value Ref Range Status  . MRSA, PCR 09/26/2015 NEGATIVE  NEGATIVE Final  . Staphylococcus aureus 09/26/2015 NEGATIVE  NEGATIVE Final   Comment:        The Xpert SA Assay (FDA approved for NASAL specimens in patients over 21 years  of age), is one component of a comprehensive surveillance program.  Test performance has been validated by [facility] for patients greater than or equal to 21 year old. It is not intended to diagnose infection nor to guide or monitor treatment.   Marland Kitchen aPTT 09/26/2015 39* 24 - 37 seconds Final   Comment:        IF BASELINE aPTT IS ELEVATED, SUGGEST PATIENT RISK ASSESSMENT BE USED TO DETERMINE APPROPRIATE ANTICOAGULANT THERAPY.   . WBC 09/26/2015 5.9  4.0 - 10.5 K/uL Final  . RBC 09/26/2015 2.95* 4.22 - 5.81 MIL/uL Final  . Hemoglobin 09/26/2015 10.9* 13.0 - 17.0 g/dL Final  . HCT 09/26/2015 31.5* 39.0 - 52.0 % Final  . MCV 09/26/2015 106.8* 78.0 - 100.0 fL Final  . MCH  09/26/2015 36.9* 26.0 - 34.0 pg Final  . MCHC 09/26/2015 34.6  30.0 - 36.0 g/dL Final  . RDW 09/26/2015 13.3  11.5 - 15.5 % Final  . Platelets 09/26/2015 230  150 - 400 K/uL Final  . Sodium 09/26/2015 137  135 - 145 mmol/L Final  . Potassium 09/26/2015 4.1  3.5 - 5.1 mmol/L Final  . Chloride 09/26/2015 102  101 - 111 mmol/L Final  . CO2 09/26/2015 30  22 - 32 mmol/L Final  . Glucose, Bld 09/26/2015 111* 65 - 99 mg/dL Final  . BUN 09/26/2015 18  6 - 20 mg/dL Final  . Creatinine, Ser 09/26/2015 0.82  0.61 - 1.24 mg/dL Final  . Calcium 09/26/2015 9.2  8.9 - 10.3 mg/dL Final  . Total Protein 09/26/2015 7.5  6.5 - 8.1 g/dL Final  . Albumin 09/26/2015 4.5  3.5 - 5.0 g/dL Final  . AST 09/26/2015 19  15 - 41 U/L Final  . ALT 09/26/2015 17  17 - 63 U/L Final  . Alkaline Phosphatase 09/26/2015 47  38 - 126 U/L Final  . Total Bilirubin 09/26/2015 1.9* 0.3 - 1.2 mg/dL Final  . GFR calc non Af Amer 09/26/2015 >60  >60 mL/min Final  . GFR calc Af Amer 09/26/2015 >60  >60 mL/min Final   Comment: (NOTE) The eGFR has been calculated using the CKD EPI equation. This calculation has not been validated in all clinical situations. eGFR's persistently <60 mL/min signify possible Chronic Kidney Disease.   . Anion gap 09/26/2015 5  5 - 15 Final  . Prothrombin Time 09/26/2015 25.1* 11.6 - 15.2 seconds Final  . INR 09/26/2015 2.40* 0.00 - 1.49 Final  . Color, Urine 09/26/2015 YELLOW  YELLOW Final  . APPearance 09/26/2015 CLOUDY* CLEAR Final  . Specific Gravity, Urine 09/26/2015 1.014  1.005 - 1.030 Final  . pH 09/26/2015 6.0  5.0 - 8.0 Final  . Glucose, UA 09/26/2015 NEGATIVE  NEGATIVE mg/dL Final  . Hgb urine dipstick 09/26/2015 NEGATIVE  NEGATIVE Final  . Bilirubin Urine 09/26/2015 NEGATIVE  NEGATIVE Final  . Ketones, ur 09/26/2015 NEGATIVE  NEGATIVE mg/dL Final  . Protein, ur 09/26/2015 NEGATIVE  NEGATIVE mg/dL Final  . Nitrite 09/26/2015 NEGATIVE  NEGATIVE Final  . Leukocytes, UA 09/26/2015 NEGATIVE   NEGATIVE Final     X-Rays:No results found.  EKG:No orders found for this or any previous visit.   Hospital Course: David Joseph is a 75 y.o. who was admitted to [facility]. They were brought to the operating room on 10/09/2015 and underwent Procedure(s): LEFT TOTAL KNEE ARTHROPLASTY.  Patient tolerated the procedure well and was later transferred to the recovery room and then to the orthopaedic floor for postoperative  care.  They were given PO and IV analgesics for pain control following their surgery.  They were given 24 hours of postoperative antibiotics of  Anti-infectives    Start     Dose/Rate Route Frequency Ordered Stop   10/09/15 1400  ceFAZolin (ANCEF) IVPB 2g/100 mL premix     2 g 200 mL/hr over 30 Minutes Intravenous Every 6 hours 10/09/15 1141 10/09/15 2102   10/09/15 0605  ceFAZolin (ANCEF) IVPB 2g/100 mL premix     2 g 200 mL/hr over 30 Minutes Intravenous On call to O.R. 10/09/15 9983 10/09/15 0816     and started on DVT prophylaxis in the form of Xarelto.   PT and OT were ordered for total joint protocol.  Discharge planning consulted to help with postop disposition and equipment needs.  Patient had a decent night on the evening of surgery.  They started to get up OOB with therapy on day one. Hemovac drain was pulled without difficulty.  Continued to work with therapy into day two.  Dressing was changed on day two and the incision was healing well. Patient was seen in rounds on POD 2and was ready to go home.  Discharge home. Plan is to go straight to outpatient therapy on Friday 10/13/2015. Diet - Regular diet Follow up - in 2 weeks Activity - WBAT Disposition - Home Condition Upon Discharge - Good D/C Meds - See DC Summary DVT Prophylaxis - Xarelto  Discharge Instructions    Call MD / Call 911    Complete by:  As directed   If you experience chest pain or shortness of breath, CALL 911 and be transported to the hospital emergency room.  If you develope a  fever above 101 F, pus (white drainage) or increased drainage or redness at the wound, or calf pain, call your surgeon's office.   Change dressing    Complete by:  As directed   Change dressing daily with sterile 4 x 4 inch gauze dressing and apply TED hose. Do not submerge the incision under water.   Constipation Prevention    Complete by:  As directed   Drink plenty of fluids.  Prune juice may be helpful.  You may use a stool softener, such as Colace (over the counter) 100 mg twice a day.  Use MiraLax (over the counter) for constipation as needed.   Diet general    Complete by:  As directed   Discharge instructions    Complete by:  As directed   Pick up stool softner and laxative for home use following surgery while on pain medications. Do not submerge incision under water. Please use good hand washing techniques while changing dressing each day. May shower starting three days after surgery. Please use a clean towel to pat the incision dry following showers. Continue to use ice for pain and swelling after surgery. Do not use any lotions or creams on the incision until instructed by your surgeon.   Postoperative Constipation Protocol  Constipation - defined medically as fewer than three stools per week and severe constipation as less than one stool per week.  One of the most common issues patients have following surgery is constipation.  Even if you have a regular bowel pattern at home, your normal regimen is likely to be disrupted due to multiple reasons following surgery.  Combination of anesthesia, postoperative narcotics, change in appetite and fluid intake all can affect your bowels.  In order to avoid complications following surgery, here are some recommendations in  order to help you during your recovery period.  Colace (docusate) - Pick up an over-the-counter form of Colace or another stool softener and take twice a day as long as you are requiring postoperative pain medications.  Take  with a full glass of water daily.  If you experience loose stools or diarrhea, hold the colace until you stool forms back up.  If your symptoms do not get better within 1 week or if they get worse, check with your doctor.  Dulcolax (bisacodyl) - Pick up over-the-counter and take as directed by the product packaging as needed to assist with the movement of your bowels.  Take with a full glass of water.  Use this product as needed if not relieved by Colace only.   MiraLax (polyethylene glycol) - Pick up over-the-counter to have on hand.  MiraLax is a solution that will increase the amount of water in your bowels to assist with bowel movements.  Take as directed and can mix with a glass of water, juice, soda, coffee, or tea.  Take if you go more than two days without a movement. Do not use MiraLax more than once per day. Call your doctor if you are still constipated or irregular after using this medication for 7 days in a row.  If you continue to have problems with postoperative constipation, please contact the office for further assistance and recommendations.  If you experience "the worst abdominal pain ever" or develop nausea or vomiting, please contact the office immediatly for further recommendations for treatment.   Take Xarelto for six weeks, then discontinue Xarelto.   Do not put a pillow under the knee. Place it under the heel.    Complete by:  As directed   Do not sit on low chairs, stoools or toilet seats, as it may be difficult to get up from low surfaces    Complete by:  As directed   Driving restrictions    Complete by:  As directed   No driving until released by the physician.   Increase activity slowly as tolerated    Complete by:  As directed   Lifting restrictions    Complete by:  As directed   No lifting until released by the physician.   Patient may shower    Complete by:  As directed   You may shower without a dressing once there is no drainage.  Do not wash over the wound.  If  drainage remains, do not shower until drainage stops.   TED hose    Complete by:  As directed   Use stockings (TED hose) for 3 weeks on both leg(s).  You may remove them at night for sleeping.       Medication List    STOP taking these medications   folic acid 1 MG tablet Commonly known as:  FOLVITE   METHOTREXATE (PF) Devol   SIMPONI ARIA IV     TAKE these medications   ferrous sulfate 325 (65 FE) MG tablet Take 325 mg by mouth daily with breakfast.   methocarbamol 500 MG tablet Commonly known as:  ROBAXIN Take 1 tablet (500 mg total) by mouth every 6 (six) hours as needed for muscle spasms.   oxyCODONE 5 MG immediate release tablet Commonly known as:  Oxy IR/ROXICODONE Take 1-2 tablets (5-10 mg total) by mouth every 3 (three) hours as needed for moderate pain or severe pain.   predniSONE 5 MG tablet Commonly known as:  DELTASONE Take 5 mg by mouth  daily with breakfast.   rivaroxaban 20 MG Tabs tablet Commonly known as:  XARELTO Take 1 tablet (20 mg total) by mouth daily with supper. Take daily for six weeks then discontinue the Xarelto. What changed:  additional instructions   traMADol 50 MG tablet Commonly known as:  ULTRAM Take 1-2 tablets (50-100 mg total) by mouth every 6 (six) hours as needed (mild pain).      Follow-up Information    [name], MD. Schedule an appointment as soon as possible for a visit on 10/24/2015.   Specialty:  Orthopedic Surgery Why:  Call office for appointment for Tuesday 10/24/2015 with Dr. Anne Fu staff. Contact information: [address] [place] [address] [phone]           Signed: [name], PA-C Orthopaedic Surgery 10/10/2015, 9:36 PM

## 2015-10-11 LAB — CBC
HCT: 27.2 % — ABNORMAL LOW (ref 39.0–52.0)
HEMOGLOBIN: 9.4 g/dL — AB (ref 13.0–17.0)
MCH: 36.4 pg — AB (ref 26.0–34.0)
MCHC: 34.6 g/dL (ref 30.0–36.0)
MCV: 105.4 fL — AB (ref 78.0–100.0)
PLATELETS: 275 10*3/uL (ref 150–400)
RBC: 2.58 MIL/uL — AB (ref 4.22–5.81)
RDW: 13 % (ref 11.5–15.5)
WBC: 12 10*3/uL — AB (ref 4.0–10.5)

## 2015-10-11 LAB — BASIC METABOLIC PANEL
ANION GAP: 5 (ref 5–15)
BUN: 13 mg/dL (ref 6–20)
CHLORIDE: 107 mmol/L (ref 101–111)
CO2: 28 mmol/L (ref 22–32)
Calcium: 8.7 mg/dL — ABNORMAL LOW (ref 8.9–10.3)
Creatinine, Ser: 0.74 mg/dL (ref 0.61–1.24)
GFR calc Af Amer: >60 mL/min (ref >60)
Glucose, Bld: 130 mg/dL — ABNORMAL HIGH (ref 65–99)
POTASSIUM: 3.8 mmol/L (ref 3.5–5.1)
SODIUM: 140 mmol/L (ref 135–145)

## 2015-10-11 NOTE — Progress Notes (Signed)
   Subjective: 2 Days Post-Op Procedure(s) (LRB): LEFT TOTAL KNEE ARTHROPLASTY (Left) Patient reports pain as mild.   Patient seen in rounds with Dr. Wynelle Link. Patient is well, and has had no acute complaints or problems Patient is ready to go home  Objective: Vital signs in last 24 hours: Temp:  [97.9 F (36.6 C)-98.5 F (36.9 C)] 97.9 F (36.6 C) (07/26 0545) Pulse Rate:  [67-79] 67 (07/26 0545) Resp:  [16] 16 (07/26 0545) BP: (123-163)/(56-75) 163/75 (07/26 0545) SpO2:  [95 %-99 %] 99 % (07/26 0545)  Intake/Output from previous day:  Intake/Output Summary (Last 24 hours) at 10/11/15 1052 Last data filed at 10/11/15 1000  Gross per 24 hour  Intake             1748 ml  Output             1950 ml  Net             -202 ml    Intake/Output this shift: Total I/O In: 788 [P.O.:240; I.V.:548] Out: 0   Labs:  Recent Labs  10/10/15 0414 10/11/15 0416  HGB 9.3* 9.4*    Recent Labs  10/10/15 0414 10/11/15 0416  WBC 10.3 12.0*  RBC 2.56* 2.58*  HCT 26.8* 27.2*  PLT 269 275    Recent Labs  10/10/15 0414 10/11/15 0416  NA 139 140  K 4.1 3.8  CL 106 107  CO2 28 28  BUN 11 13  CREATININE 0.68 0.74  GLUCOSE 159* 130*  CALCIUM 8.6* 8.7*    Recent Labs  10/09/15 0640  INR 1.04    EXAM: General - Patient is Alert, Appropriate and Oriented Extremity - Neurovascular intact Sensation intact distally Dorsiflexion/Plantar flexion intact Incision - clean, dry, no drainage Motor Function - intact, moving foot and toes well on exam.   Assessment/Plan: 2 Days Post-Op Procedure(s) (LRB): LEFT TOTAL KNEE ARTHROPLASTY (Left) Procedure(s) (LRB): LEFT TOTAL KNEE ARTHROPLASTY (Left) Past Medical History:  Diagnosis Date  . Anemia   . Arthritis    OA  . Diverticulitis    10 years ago  . DVT (deep venous thrombosis) ([place]) 04/09/2015   left leg- recently  . Pneumonia 13 years ago   Principal Problem:   OA (osteoarthritis) of knee  Estimated body mass  index is 26.14 kg/m as calculated from the following:   Height as of this encounter: '5\' 9"'$  (1.753 m).   Weight as of this encounter: 80.3 kg (177 lb). Up with therapy  Discharge home.  Plan is to go straight to outpatient therapy on Friday 10/13/2015. Diet - Regular diet Follow up - in 2 weeks Activity - WBAT Disposition - Home Condition Upon Discharge - Good D/C Meds - See DC Summary DVT Prophylaxis - Xarelto  [name], PA-C Orthopaedic Surgery 10/11/2015, 10:52 AM

## 2015-10-11 NOTE — Progress Notes (Signed)
RN reviewed discharge instructions with patient and family. All questions answered.  Paperwork and prescriptions given.   NT rolled patient down with all belongings to patient car.

## 2015-10-11 NOTE — Care Management Note (Signed)
Case Management Note  Patient Details  Name: David Joseph MRN: [id number] Date of Birth: 10-15-1940  Subjective/Objective:                  LEFT TOTAL KNEE ARTHROPLASTY (Left) Action/Plan: Discharge planning Expected Discharge Date:                  Expected Discharge Plan:  Home/Self Care  In-House Referral:     Discharge planning Services  CM Consult  Post Acute Care Choice:    Choice offered to:  Patient  DME Arranged:  N/A DME Agency:  NA  HH Arranged:  NA HH Agency:     Status of Service:  Completed, signed off  If discussed at [place] of Stay Meetings, dates discussed:    Additional Comments: Cm met with pt in room to confirm plan is for outpt PT; pt confirms.  Pt states he has DME and declines need for additional DME.  No other CM needs were communicated. Dellie Catholic, RN 10/11/2015, 10:54 AM

## 2015-10-11 NOTE — Progress Notes (Signed)
Physical Therapy Treatment Patient Details Name: David Joseph MRN: [id number] DOB: September 29, 1940 Today's Date: October 19, 2015    History of Present Illness s/p L TKA    PT Comments    Pt ambulated in hallway and performed LE exercises.  Pt feels ready for d/c home today.  Follow Up Recommendations  Outpatient PT     Equipment Recommendations  None recommended by PT    Recommendations for Other Services       Precautions / Restrictions Precautions Precautions: Fall;Knee Restrictions LLE Weight Bearing: Weight bearing as tolerated    Mobility  Bed Mobility Overal bed mobility: Needs Assistance Bed Mobility: Sit to Supine       Sit to supine: Supervision      Transfers Overall transfer level: Needs assistance Equipment used: Rolling walker (2 wheeled) Transfers: Sit to/from Stand Sit to Stand: Supervision            Ambulation/Gait Ambulation/Gait assistance: Supervision Ambulation Distance (Feet): 240 Feet Assistive device: Rolling walker (2 wheeled) Gait Pattern/deviations: Step-through pattern     General Gait Details: verbal cues for heel strike, RW positioning   Stairs Stairs:  (pt stated he did not feel he needed to practice steps again)          Wheelchair Mobility    Modified Rankin (Stroke Patients Only)       Balance                                    Cognition Arousal/Alertness: Awake/alert Behavior During Therapy: WFL for tasks assessed/performed Overall Cognitive Status: Within Functional Limits for tasks assessed                      Exercises Total Joint Exercises Ankle Circles/Pumps: AROM;Both;10 reps Quad Sets: AROM;Left;10 reps Short Arc Quad: AROM;Left;10 reps Heel Slides: AAROM;Left;10 reps Hip ABduction/ADduction: AROM;Left;10 reps Straight Leg Raises: AROM;Left;10 reps    General Comments        Pertinent Vitals/Pain Pain Assessment: 0-10 Pain Score: 1  Pain Location: L knee Pain  Descriptors / Indicators: Sore;Tightness Pain Intervention(s): Limited activity within patient's tolerance;Monitored during session;Repositioned;Ice applied    Home Living                      Prior Function            PT Goals (current goals can now be found in the care plan section) Progress towards PT goals: Progressing toward goals    Frequency  7X/week    PT Plan Current plan remains appropriate    Co-evaluation             End of Session   Activity Tolerance: Patient tolerated treatment well Patient left: in bed;with call bell/phone within reach;with SCD's reapplied     Time: 2707-8675 PT Time Calculation (min) (ACUTE ONLY): 16 min  Charges:  $Gait Training: 8-22 mins                    G Codes:      Jaziah Kwasnik,[name] 10/19/2015, 1:21 PM Carmelia Bake, PT, DPT 10-19-15 Pager: [phone]

## 2015-10-13 DIAGNOSIS — M069 Rheumatoid arthritis, unspecified: Secondary | ICD-10-CM | POA: Diagnosis not present

## 2015-10-13 DIAGNOSIS — M4712 Other spondylosis with myelopathy, cervical region: Secondary | ICD-10-CM | POA: Diagnosis not present

## 2015-10-13 DIAGNOSIS — M4722 Other spondylosis with radiculopathy, cervical region: Secondary | ICD-10-CM | POA: Diagnosis not present

## 2015-10-17 DIAGNOSIS — M4712 Other spondylosis with myelopathy, cervical region: Secondary | ICD-10-CM | POA: Diagnosis not present

## 2015-10-17 DIAGNOSIS — M069 Rheumatoid arthritis, unspecified: Secondary | ICD-10-CM | POA: Diagnosis not present

## 2015-10-17 DIAGNOSIS — M4722 Other spondylosis with radiculopathy, cervical region: Secondary | ICD-10-CM | POA: Diagnosis not present

## 2015-10-20 DIAGNOSIS — M4722 Other spondylosis with radiculopathy, cervical region: Secondary | ICD-10-CM | POA: Diagnosis not present

## 2015-10-20 DIAGNOSIS — M4712 Other spondylosis with myelopathy, cervical region: Secondary | ICD-10-CM | POA: Diagnosis not present

## 2015-10-20 DIAGNOSIS — M069 Rheumatoid arthritis, unspecified: Secondary | ICD-10-CM | POA: Diagnosis not present

## 2015-10-23 DIAGNOSIS — Z96652 Presence of left artificial knee joint: Secondary | ICD-10-CM | POA: Diagnosis not present

## 2015-10-23 DIAGNOSIS — Z471 Aftercare following joint replacement surgery: Secondary | ICD-10-CM | POA: Diagnosis not present

## 2015-11-03 DIAGNOSIS — M0609 Rheumatoid arthritis without rheumatoid factor, multiple sites: Secondary | ICD-10-CM | POA: Diagnosis not present

## 2015-11-14 DIAGNOSIS — Z471 Aftercare following joint replacement surgery: Secondary | ICD-10-CM | POA: Diagnosis not present

## 2015-11-14 DIAGNOSIS — Z96652 Presence of left artificial knee joint: Secondary | ICD-10-CM | POA: Diagnosis not present

## 2015-12-04 DIAGNOSIS — M5030 Other cervical disc degeneration, unspecified cervical region: Secondary | ICD-10-CM | POA: Diagnosis not present

## 2015-12-04 DIAGNOSIS — J3489 Other specified disorders of nose and nasal sinuses: Secondary | ICD-10-CM | POA: Diagnosis not present

## 2015-12-04 DIAGNOSIS — M5136 Other intervertebral disc degeneration, lumbar region: Secondary | ICD-10-CM | POA: Diagnosis not present

## 2015-12-04 DIAGNOSIS — Z8739 Personal history of other diseases of the musculoskeletal system and connective tissue: Secondary | ICD-10-CM | POA: Diagnosis not present

## 2015-12-04 DIAGNOSIS — M2550 Pain in unspecified joint: Secondary | ICD-10-CM | POA: Diagnosis not present

## 2015-12-04 DIAGNOSIS — Z79899 Other long term (current) drug therapy: Secondary | ICD-10-CM | POA: Diagnosis not present

## 2015-12-04 DIAGNOSIS — M0600 Rheumatoid arthritis without rheumatoid factor, unspecified site: Secondary | ICD-10-CM | POA: Diagnosis not present

## 2015-12-26 DIAGNOSIS — Z471 Aftercare following joint replacement surgery: Secondary | ICD-10-CM | POA: Diagnosis not present

## 2015-12-26 DIAGNOSIS — Z96652 Presence of left artificial knee joint: Secondary | ICD-10-CM | POA: Diagnosis not present

## 2015-12-29 DIAGNOSIS — M0609 Rheumatoid arthritis without rheumatoid factor, multiple sites: Secondary | ICD-10-CM | POA: Diagnosis not present

## 2015-12-29 DIAGNOSIS — Z79899 Other long term (current) drug therapy: Secondary | ICD-10-CM | POA: Diagnosis not present

## 2016-01-10 DIAGNOSIS — Z23 Encounter for immunization: Secondary | ICD-10-CM | POA: Diagnosis not present

## 2016-01-11 DIAGNOSIS — K5732 Diverticulitis of large intestine without perforation or abscess without bleeding: Secondary | ICD-10-CM | POA: Diagnosis not present

## 2016-01-11 DIAGNOSIS — R300 Dysuria: Secondary | ICD-10-CM | POA: Diagnosis not present

## 2016-01-11 DIAGNOSIS — Z6826 Body mass index (BMI) 26.0-26.9, adult: Secondary | ICD-10-CM | POA: Diagnosis not present

## 2016-01-17 ENCOUNTER — Encounter (INDEPENDENT_AMBULATORY_CARE_PROVIDER_SITE_OTHER): Payer: Self-pay | Admitting: Internal Medicine

## 2016-01-22 ENCOUNTER — Encounter (INDEPENDENT_AMBULATORY_CARE_PROVIDER_SITE_OTHER): Payer: Self-pay | Admitting: Internal Medicine

## 2016-01-22 ENCOUNTER — Encounter (INDEPENDENT_AMBULATORY_CARE_PROVIDER_SITE_OTHER): Payer: Self-pay

## 2016-01-22 ENCOUNTER — Ambulatory Visit (INDEPENDENT_AMBULATORY_CARE_PROVIDER_SITE_OTHER): Payer: Medicare Other | Admitting: Internal Medicine

## 2016-01-22 VITALS — BP 120/64 | HR 60 | Temp 98.3°F | Ht 69.0 in | Wt 174.7 lb

## 2016-01-22 DIAGNOSIS — M069 Rheumatoid arthritis, unspecified: Secondary | ICD-10-CM | POA: Insufficient documentation

## 2016-01-22 DIAGNOSIS — Z79899 Other long term (current) drug therapy: Secondary | ICD-10-CM | POA: Diagnosis not present

## 2016-01-22 DIAGNOSIS — K5732 Diverticulitis of large intestine without perforation or abscess without bleeding: Secondary | ICD-10-CM | POA: Diagnosis not present

## 2016-01-22 NOTE — Progress Notes (Signed)
Subjective:    Patient ID: David Joseph, male    DOB: June 21, 1940, 75 y.o.   MRN: [id number]  HPIReferred by Dr. Nadara Mustard for diverticulitis. Hx of diverticulitis. Empirically treated for diverticulitis in October with Cipro and Flagyl x 14 days. Admitted to [facility] in January for acute sigmoid diverticulitis.  He tells me he has a hx of same.   He tells me he has had a pain in his LLQ for about a month.  He tells me he has pain over his supra pubic area. He has slight nausea. Once he eats, the nausea resolves.  He says he has been having diverticulare flare about every 6-8 weeks in the past 6 months. He has had 3 bouts of diverticulitis this year.  hx of DVT in January and started on Xarelto. Xarelto stopped in September. Knee replacement in July of this year.  10/06/2015 CT abdomen/pelvis with CM:  Hx of diverticulitis. : No acute abnormalities involving the abdomen or pelvis. Diffuse colonic diverticulosis without evidence of acute diverticulitis.  Last colonoscopy was in 2013by Dr. Arlester Marker and was normal.   Review of Systems Past Medical History:  Diagnosis Date  . Anemia   . Arthritis    OA  . Diverticulitis    10 years ago  . DVT (deep venous thrombosis) ([place]) 04/09/2015   left leg- recently  . Pneumonia 13 years ago    Past Surgical History:  Procedure Laterality Date  . [place]   lower   . 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 COLONOSCOPY  August 2013  . EYE SURGERY  2 years ago   both cataract  . JOINT REPLACEMENT  March 2012, 02/2012   right knee, redo right knee  . left middle finger surgery  March 2012  . PROSTATECTOMY  12/2013   at [facility]  . TONSILLECTOMY  60 years ago  . TOTAL KNEE ARTHROPLASTY Left 10/09/2015   Procedure: LEFT TOTAL KNEE ARTHROPLASTY;  Surgeon: Gaynelle Arabian, MD;  Location: [facility];  Service: Orthopedics;  Laterality: Left;  . TOTAL KNEE REVISION  02/19/2012   Procedure: TOTAL KNEE REVISION;  Surgeon:  [name], MD;  Location: [facility];  Service: Orthopedics;  Laterality: Right;    Allergies  Allergen Reactions  . Iodinated Diagnostic Agents Rash    Can tolerate IV dye with steroid protocol    Current Outpatient Prescriptions on File Prior to Visit  Medication Sig Dispense Refill  . ferrous sulfate 325 (65 FE) MG tablet Take 325 mg by mouth daily with breakfast.    . predniSONE (DELTASONE) 5 MG tablet Take 2.5 mg by mouth daily with breakfast. X 4 weeks for RA     No current facility-administered medications on file prior to visit.        Objective:   Physical Exam Blood pressure (!) 112/58, pulse 60, temperature 98.3 F (36.8 C), height '5\' 9"'$  (1.753 m), weight 174 lb 11.2 oz (79.2 kg). Alert and oriented. Skin warm and dry. Oral mucosa is moist.   . Sclera anicteric, conjunctivae is pink. Thyroid not enlarged. No cervical lymphadenopathy. Lungs clear. Heart regular rate and rhythm.  Abdomen is soft. Bowel sounds are positive. No hepatomegaly. No abdominal masses felt. No tenderness.  No edema to lower extremities.          Assessment & Plan:  Diverticulitis. Needs surgical referral to Dr. Anthony Sar,.  Diverticulosis/diverticulitis diet given to patient.

## 2016-01-22 NOTE — Patient Instructions (Signed)
Referral to Dr. DeMason 

## 2016-01-25 DIAGNOSIS — K5792 Diverticulitis of intestine, part unspecified, without perforation or abscess without bleeding: Secondary | ICD-10-CM | POA: Diagnosis not present

## 2016-01-25 DIAGNOSIS — R1030 Lower abdominal pain, unspecified: Secondary | ICD-10-CM | POA: Diagnosis not present

## 2016-01-26 ENCOUNTER — Encounter (INDEPENDENT_AMBULATORY_CARE_PROVIDER_SITE_OTHER): Payer: Self-pay

## 2016-02-13 DIAGNOSIS — Z7952 Long term (current) use of systemic steroids: Secondary | ICD-10-CM | POA: Diagnosis not present

## 2016-02-13 DIAGNOSIS — K5792 Diverticulitis of intestine, part unspecified, without perforation or abscess without bleeding: Secondary | ICD-10-CM | POA: Diagnosis not present

## 2016-02-13 DIAGNOSIS — R1030 Lower abdominal pain, unspecified: Secondary | ICD-10-CM | POA: Diagnosis not present

## 2016-02-13 DIAGNOSIS — M069 Rheumatoid arthritis, unspecified: Secondary | ICD-10-CM | POA: Diagnosis not present

## 2016-02-13 DIAGNOSIS — Z91041 Radiographic dye allergy status: Secondary | ICD-10-CM | POA: Diagnosis not present

## 2016-02-13 DIAGNOSIS — Z79899 Other long term (current) drug therapy: Secondary | ICD-10-CM | POA: Diagnosis not present

## 2016-02-13 DIAGNOSIS — Z86718 Personal history of other venous thrombosis and embolism: Secondary | ICD-10-CM | POA: Diagnosis not present

## 2016-02-13 DIAGNOSIS — Z96653 Presence of artificial knee joint, bilateral: Secondary | ICD-10-CM | POA: Diagnosis not present

## 2016-02-15 DIAGNOSIS — K5792 Diverticulitis of intestine, part unspecified, without perforation or abscess without bleeding: Secondary | ICD-10-CM | POA: Diagnosis not present

## 2016-02-26 DIAGNOSIS — Z79899 Other long term (current) drug therapy: Secondary | ICD-10-CM | POA: Diagnosis not present

## 2016-02-26 DIAGNOSIS — M0609 Rheumatoid arthritis without rheumatoid factor, multiple sites: Secondary | ICD-10-CM | POA: Diagnosis not present

## 2016-03-04 DIAGNOSIS — M5030 Other cervical disc degeneration, unspecified cervical region: Secondary | ICD-10-CM | POA: Diagnosis not present

## 2016-03-04 DIAGNOSIS — M0600 Rheumatoid arthritis without rheumatoid factor, unspecified site: Secondary | ICD-10-CM | POA: Diagnosis not present

## 2016-03-04 DIAGNOSIS — Z79899 Other long term (current) drug therapy: Secondary | ICD-10-CM | POA: Diagnosis not present

## 2016-03-04 DIAGNOSIS — E663 Overweight: Secondary | ICD-10-CM | POA: Diagnosis not present

## 2016-03-04 DIAGNOSIS — M2550 Pain in unspecified joint: Secondary | ICD-10-CM | POA: Diagnosis not present

## 2016-03-04 DIAGNOSIS — Z6826 Body mass index (BMI) 26.0-26.9, adult: Secondary | ICD-10-CM | POA: Diagnosis not present

## 2016-03-04 DIAGNOSIS — J3489 Other specified disorders of nose and nasal sinuses: Secondary | ICD-10-CM | POA: Diagnosis not present

## 2016-03-04 DIAGNOSIS — Z8739 Personal history of other diseases of the musculoskeletal system and connective tissue: Secondary | ICD-10-CM | POA: Diagnosis not present

## 2016-03-04 DIAGNOSIS — M5136 Other intervertebral disc degeneration, lumbar region: Secondary | ICD-10-CM | POA: Diagnosis not present

## 2016-03-26 DIAGNOSIS — M0600 Rheumatoid arthritis without rheumatoid factor, unspecified site: Secondary | ICD-10-CM | POA: Diagnosis not present

## 2016-03-26 DIAGNOSIS — Z79899 Other long term (current) drug therapy: Secondary | ICD-10-CM | POA: Diagnosis not present

## 2016-04-16 DIAGNOSIS — L570 Actinic keratosis: Secondary | ICD-10-CM | POA: Diagnosis not present

## 2016-04-16 DIAGNOSIS — Z85828 Personal history of other malignant neoplasm of skin: Secondary | ICD-10-CM | POA: Diagnosis not present

## 2016-04-24 DIAGNOSIS — M5136 Other intervertebral disc degeneration, lumbar region: Secondary | ICD-10-CM | POA: Diagnosis not present

## 2016-04-24 DIAGNOSIS — Z8739 Personal history of other diseases of the musculoskeletal system and connective tissue: Secondary | ICD-10-CM | POA: Diagnosis not present

## 2016-04-24 DIAGNOSIS — Z6827 Body mass index (BMI) 27.0-27.9, adult: Secondary | ICD-10-CM | POA: Diagnosis not present

## 2016-04-24 DIAGNOSIS — M0600 Rheumatoid arthritis without rheumatoid factor, unspecified site: Secondary | ICD-10-CM | POA: Diagnosis not present

## 2016-04-24 DIAGNOSIS — R768 Other specified abnormal immunological findings in serum: Secondary | ICD-10-CM | POA: Diagnosis not present

## 2016-04-24 DIAGNOSIS — M5030 Other cervical disc degeneration, unspecified cervical region: Secondary | ICD-10-CM | POA: Diagnosis not present

## 2016-04-24 DIAGNOSIS — Z79899 Other long term (current) drug therapy: Secondary | ICD-10-CM | POA: Diagnosis not present

## 2016-04-24 DIAGNOSIS — D61818 Other pancytopenia: Secondary | ICD-10-CM | POA: Diagnosis not present

## 2016-04-24 DIAGNOSIS — M2550 Pain in unspecified joint: Secondary | ICD-10-CM | POA: Diagnosis not present

## 2016-05-14 DIAGNOSIS — N5231 Erectile dysfunction following radical prostatectomy: Secondary | ICD-10-CM | POA: Diagnosis not present

## 2016-05-14 DIAGNOSIS — C61 Malignant neoplasm of prostate: Secondary | ICD-10-CM | POA: Diagnosis not present

## 2016-05-22 DIAGNOSIS — Z79899 Other long term (current) drug therapy: Secondary | ICD-10-CM | POA: Diagnosis not present

## 2016-05-22 DIAGNOSIS — J0191 Acute recurrent sinusitis, unspecified: Secondary | ICD-10-CM | POA: Diagnosis not present

## 2016-05-22 DIAGNOSIS — M0609 Rheumatoid arthritis without rheumatoid factor, multiple sites: Secondary | ICD-10-CM | POA: Diagnosis not present

## 2016-05-22 DIAGNOSIS — Z6826 Body mass index (BMI) 26.0-26.9, adult: Secondary | ICD-10-CM | POA: Diagnosis not present

## 2016-06-07 DIAGNOSIS — M0609 Rheumatoid arthritis without rheumatoid factor, multiple sites: Secondary | ICD-10-CM | POA: Diagnosis not present

## 2016-06-21 DIAGNOSIS — Z79899 Other long term (current) drug therapy: Secondary | ICD-10-CM | POA: Diagnosis not present

## 2016-06-21 DIAGNOSIS — M0609 Rheumatoid arthritis without rheumatoid factor, multiple sites: Secondary | ICD-10-CM | POA: Diagnosis not present

## 2016-06-24 DIAGNOSIS — Z79899 Other long term (current) drug therapy: Secondary | ICD-10-CM | POA: Diagnosis not present

## 2016-06-24 DIAGNOSIS — M5030 Other cervical disc degeneration, unspecified cervical region: Secondary | ICD-10-CM | POA: Diagnosis not present

## 2016-06-24 DIAGNOSIS — M5136 Other intervertebral disc degeneration, lumbar region: Secondary | ICD-10-CM | POA: Diagnosis not present

## 2016-06-24 DIAGNOSIS — E663 Overweight: Secondary | ICD-10-CM | POA: Diagnosis not present

## 2016-06-24 DIAGNOSIS — M2550 Pain in unspecified joint: Secondary | ICD-10-CM | POA: Diagnosis not present

## 2016-06-24 DIAGNOSIS — Z6827 Body mass index (BMI) 27.0-27.9, adult: Secondary | ICD-10-CM | POA: Diagnosis not present

## 2016-06-24 DIAGNOSIS — M0600 Rheumatoid arthritis without rheumatoid factor, unspecified site: Secondary | ICD-10-CM | POA: Diagnosis not present

## 2016-07-19 DIAGNOSIS — M0609 Rheumatoid arthritis without rheumatoid factor, multiple sites: Secondary | ICD-10-CM | POA: Diagnosis not present

## 2016-07-23 DIAGNOSIS — Z6826 Body mass index (BMI) 26.0-26.9, adult: Secondary | ICD-10-CM | POA: Diagnosis not present

## 2016-07-23 DIAGNOSIS — R1032 Left lower quadrant pain: Secondary | ICD-10-CM | POA: Diagnosis not present

## 2016-07-25 DIAGNOSIS — R1032 Left lower quadrant pain: Secondary | ICD-10-CM | POA: Diagnosis not present

## 2016-07-25 DIAGNOSIS — Z6826 Body mass index (BMI) 26.0-26.9, adult: Secondary | ICD-10-CM | POA: Diagnosis not present

## 2016-09-03 DIAGNOSIS — Z79899 Other long term (current) drug therapy: Secondary | ICD-10-CM | POA: Diagnosis not present

## 2016-09-03 DIAGNOSIS — M5030 Other cervical disc degeneration, unspecified cervical region: Secondary | ICD-10-CM | POA: Diagnosis not present

## 2016-09-03 DIAGNOSIS — Z6826 Body mass index (BMI) 26.0-26.9, adult: Secondary | ICD-10-CM | POA: Diagnosis not present

## 2016-09-03 DIAGNOSIS — M0600 Rheumatoid arthritis without rheumatoid factor, unspecified site: Secondary | ICD-10-CM | POA: Diagnosis not present

## 2016-09-03 DIAGNOSIS — M5136 Other intervertebral disc degeneration, lumbar region: Secondary | ICD-10-CM | POA: Diagnosis not present

## 2016-09-03 DIAGNOSIS — E663 Overweight: Secondary | ICD-10-CM | POA: Diagnosis not present

## 2016-09-03 DIAGNOSIS — M2550 Pain in unspecified joint: Secondary | ICD-10-CM | POA: Diagnosis not present

## 2016-09-09 DIAGNOSIS — Z79899 Other long term (current) drug therapy: Secondary | ICD-10-CM | POA: Diagnosis not present

## 2016-09-09 DIAGNOSIS — M0609 Rheumatoid arthritis without rheumatoid factor, multiple sites: Secondary | ICD-10-CM | POA: Diagnosis not present

## 2016-09-12 DIAGNOSIS — H43393 Other vitreous opacities, bilateral: Secondary | ICD-10-CM | POA: Diagnosis not present

## 2016-09-25 DIAGNOSIS — M5030 Other cervical disc degeneration, unspecified cervical region: Secondary | ICD-10-CM | POA: Diagnosis not present

## 2016-09-25 DIAGNOSIS — M2550 Pain in unspecified joint: Secondary | ICD-10-CM | POA: Diagnosis not present

## 2016-09-25 DIAGNOSIS — M0600 Rheumatoid arthritis without rheumatoid factor, unspecified site: Secondary | ICD-10-CM | POA: Diagnosis not present

## 2016-09-25 DIAGNOSIS — Z79899 Other long term (current) drug therapy: Secondary | ICD-10-CM | POA: Diagnosis not present

## 2016-09-25 DIAGNOSIS — M5136 Other intervertebral disc degeneration, lumbar region: Secondary | ICD-10-CM | POA: Diagnosis not present

## 2016-09-25 DIAGNOSIS — D649 Anemia, unspecified: Secondary | ICD-10-CM | POA: Diagnosis not present

## 2016-09-25 DIAGNOSIS — E663 Overweight: Secondary | ICD-10-CM | POA: Diagnosis not present

## 2016-09-25 DIAGNOSIS — Z6826 Body mass index (BMI) 26.0-26.9, adult: Secondary | ICD-10-CM | POA: Diagnosis not present

## 2016-09-26 DIAGNOSIS — Z6825 Body mass index (BMI) 25.0-25.9, adult: Secondary | ICD-10-CM | POA: Diagnosis not present

## 2016-09-26 DIAGNOSIS — M069 Rheumatoid arthritis, unspecified: Secondary | ICD-10-CM | POA: Diagnosis not present

## 2016-09-26 DIAGNOSIS — D649 Anemia, unspecified: Secondary | ICD-10-CM | POA: Diagnosis not present

## 2016-10-08 DIAGNOSIS — L570 Actinic keratosis: Secondary | ICD-10-CM | POA: Diagnosis not present

## 2016-10-09 DIAGNOSIS — Z961 Presence of intraocular lens: Secondary | ICD-10-CM | POA: Diagnosis not present

## 2016-10-09 DIAGNOSIS — H35361 Drusen (degenerative) of macula, right eye: Secondary | ICD-10-CM | POA: Diagnosis not present

## 2016-10-09 DIAGNOSIS — H26492 Other secondary cataract, left eye: Secondary | ICD-10-CM | POA: Diagnosis not present

## 2016-10-09 DIAGNOSIS — H26491 Other secondary cataract, right eye: Secondary | ICD-10-CM | POA: Diagnosis not present

## 2016-10-17 ENCOUNTER — Ambulatory Visit (INDEPENDENT_AMBULATORY_CARE_PROVIDER_SITE_OTHER): Payer: Medicare Other | Admitting: Cardiovascular Disease

## 2016-10-17 ENCOUNTER — Encounter: Payer: Self-pay | Admitting: Cardiovascular Disease

## 2016-10-17 ENCOUNTER — Encounter: Payer: Self-pay | Admitting: *Deleted

## 2016-10-17 VITALS — BP 128/64 | HR 76 | Ht 69.0 in | Wt 179.0 lb

## 2016-10-17 DIAGNOSIS — R0602 Shortness of breath: Secondary | ICD-10-CM | POA: Diagnosis not present

## 2016-10-17 DIAGNOSIS — I2510 Atherosclerotic heart disease of native coronary artery without angina pectoris: Secondary | ICD-10-CM | POA: Diagnosis not present

## 2016-10-17 NOTE — Progress Notes (Signed)
CARDIOLOGY CONSULT NOTE  Patient ID: David Joseph MRN: [id number] DOB/AGE: 19-Oct-1940 76 y.o.  Admit date: (Not on file) Primary Physician: Rory Percy, MD Referring Physician: Nadara Mustard  Reason for Consultation: shortness of breath  HPI: David Joseph is a 76 y.o. male who is being seen today for the evaluation of shortness of breath at the request of Rory Percy, MD.   He has a history of rheumatoid arthritis. He also has anemia. He has been more short of breath. A CT scan from July 2017 performed for recurring diverticulitis mentioned diffuse atherosclerotic disease. PCP has questioned whether or not to perform stress testing versus cardiac catheterization.  I personally reviewed all office documentation, labs, and studies.   CT scan from 10/06/15 performed at [facility] mentions "extensive 3 vessel coronary atherosclerosis "mentioning the LAD and RCA specifically.  Labs 09/10/16: Hemoglobin 9.2, platelets 355, white blood cells 6.6, BUN 18, creatinine 1.13, albumin 4.  ECG performed in the office today which I ordered and personally interpreted demonstrates normal sinus rhythm with no ischemic ST segment or T-wave abnormalities, nor any arrhythmias.  He tells me he has been experiencing exertional dyspnea for the past 2 or 3 months, but has been more progressive over the past one month. He noticed a more significant episode about 2 or 3 weeks ago when he was walking his trash cans up a hill in his driveway. He denies exertional chest pain. He also denies palpitations, orthopnea, and paroxysmal nocturnal dyspnea. He has noticed more venous varicosities and ankle swelling at the end of the day.  He also has a history of prostate cancer with prostatectomy.  He does not smoke and denies a history of lung disease. He is on methotrexate and was diagnosed with rheumatoid arthritis about 2 years ago.  He is here with his wife who used to be the office manager for the  orthopedic practice that used to be in our office building.  Family history: Mother had MI in her 51s. He has 2 maternal uncles who had MIs in their 43s. He has a brother with multiple sclerosis.   Allergies  Allergen Reactions  . Iodinated Diagnostic Agents Rash    Can tolerate IV dye with steroid protocol    Current Outpatient Prescriptions  Medication Sig Dispense Refill  . folic acid (FOLVITE) 1 MG tablet Take 1 mg by mouth daily.    . methotrexate (50 MG/ML) 1 g injection Inject into the vein once. .7 weekly    . predniSONE (DELTASONE) 5 MG tablet Take 2.5 mg by mouth daily with breakfast. X 4 weeks for RA     No current facility-administered medications for this visit.     Past Medical History:  Diagnosis Date  . Anemia   . Arthritis    OA  . Diverticulitis    10 years ago  . DVT (deep venous thrombosis) ([place]) 04/09/2015   left leg- recently  . Pneumonia 13 years ago  . Rheumatoid arthritis [facility])     Past Surgical History:  Procedure Laterality Date  . [place]   lower   . 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 COLONOSCOPY  August 2013  . EYE SURGERY  2 years ago   both cataract  . JOINT REPLACEMENT  March 2012, 02/2012   right knee, redo right knee  . left middle finger surgery  March 2012  . PROSTATECTOMY  12/2013   at [facility]  . TONSILLECTOMY  60 years ago  . TOTAL KNEE ARTHROPLASTY Left 10/09/2015   Procedure: LEFT TOTAL KNEE ARTHROPLASTY;  Surgeon: Gaynelle Arabian, MD;  Location: [facility];  Service: Orthopedics;  Laterality: Left;  . TOTAL KNEE REVISION  02/19/2012   Procedure: TOTAL KNEE REVISION;  Surgeon: [name], MD;  Location: [facility];  Service: Orthopedics;  Laterality: Right;    Social History   Social History  . Marital status: Married    Spouse name: N/A  . Number of children: N/A  . Years of education: N/A   Occupational History  . Not on file.   Social History Main Topics  . Smoking  status: Former Smoker    Years: 5.00    Types: Cigars    Quit date: 01/27/2012  . Smokeless tobacco: Never Used     Comment: cigars twice a week  . Alcohol use 4.2 oz/week    7 Glasses of wine per week     Comment: 1 drink a night.  . Drug use: No  . Sexual activity: Not on file   Other Topics Concern  . Not on file   Social History Narrative  . No narrative on file      Current Meds  Medication Sig  . folic acid (FOLVITE) 1 MG tablet Take 1 mg by mouth daily.  . methotrexate (50 MG/ML) 1 g injection Inject into the vein once. .7 weekly  . predniSONE (DELTASONE) 5 MG tablet Take 2.5 mg by mouth daily with breakfast. X 4 weeks for RA      Review of systems complete and found to be negative unless listed above in HPI    Physical exam Blood pressure 128/64, pulse 76, height 5\' 9"  (1.753 m), weight 179 lb (81.2 kg), SpO2 99 %. General: NAD Neck: No JVD, no thyromegaly or thyroid nodule.  Lungs: Clear to auscultation bilaterally with normal respiratory effort. CV: Nondisplaced PMI. Regular rate and rhythm, normal S1/S2, no S3/S4, no murmur.  No peripheral edema.  No carotid bruit.    Abdomen: Soft, nontender, no distention.  Skin: Intact without lesions or rashes.  Neurologic: Alert and oriented x 3.  Psych: Normal affect. Extremities: No clubbing or cyanosis.  HEENT: Normal.   ECG: Most recent ECG reviewed.   Labs: Lab Results  Component Value Date/Time   K 3.8 10/11/2015 04:16 AM   BUN 13 10/11/2015 04:16 AM   CREATININE 0.74 10/11/2015 04:16 AM   ALT 17 09/26/2015 10:20 AM   HGB 9.4 (L) 10/11/2015 04:16 AM     Lipids: No results found for: LDLCALC, LDLDIRECT, CHOL, TRIG, HDL      ASSESSMENT AND PLAN:  1. Exertional dyspnea and coronary artery calcifications: Autoimmune diseases and specifically connective tissue diseases such as rheumatoid arthritis are associated with a higher prevalence of coronary artery disease. He has also been on methotrexate which  can lead to pulmonary disease as can rheumatoid arthritis itself. In order to exclude ischemic heart disease as an etiology, I will proceed with exercise Myoview nuclear stress testing.  Disposition: Follow up in 1 month  Signed: Kate Sable, M.D., F.A.C.C.  10/17/2016, 8:50 AM

## 2016-10-17 NOTE — Patient Instructions (Signed)
Medication Instructions:  Continue all current medications.  Labwork: none  Testing/Procedures:  Your physician has requested that you have en exercise stress myoview. For further information please visit [email or web address]. Please follow instruction sheet, as given.  Office will contact with results via phone or letter.    Follow-Up: 1 month   Any Other Special Instructions Will Be Listed Below (If Applicable).  If you need a refill on your cardiac medications before your next appointment, please call your pharmacy.

## 2016-10-21 DIAGNOSIS — M0609 Rheumatoid arthritis without rheumatoid factor, multiple sites: Secondary | ICD-10-CM | POA: Diagnosis not present

## 2016-10-22 ENCOUNTER — Ambulatory Visit (HOSPITAL_COMMUNITY)
Admission: RE | Admit: 2016-10-22 | Discharge: 2016-10-22 | Disposition: A | Discharge status: Home / Self Care | Payer: Medicare Other | Source: Ambulatory Visit | Attending: Cardiovascular Disease | Admitting: Cardiovascular Disease

## 2016-10-22 ENCOUNTER — Encounter (HOSPITAL_COMMUNITY): Payer: Self-pay

## 2016-10-22 ENCOUNTER — Encounter (HOSPITAL_COMMUNITY)
Admission: RE | Admit: 2016-10-22 | Discharge: 2016-10-22 | Disposition: A | Discharge status: Home / Self Care | Payer: Medicare Other | Source: Ambulatory Visit | Attending: Cardiovascular Disease | Admitting: Cardiovascular Disease

## 2016-10-22 DIAGNOSIS — R0602 Shortness of breath: Secondary | ICD-10-CM

## 2016-10-22 LAB — NM MYOCAR MULTI W/SPECT W/WALL MOTION / EF
CHL RATE OF PERCEIVED EXERTION: 13
Estimated workload: 5 METS
Exercise duration (min): 4 min
Exercise duration (sec): 15 s
LVDIAVOL: 99 mL (ref 62–150)
LVSYSVOL: 36 mL
MPHR: 146 {beats}/min
NUC STRESS TID: 0.93
Peak HR: 134 {beats}/min
Percent HR: 91 %
RATE: 0.32
Rest HR: 57 {beats}/min
SDS: 1
SRS: 0
SSS: 1

## 2016-10-22 MED ORDER — REGADENOSON 0.4 MG/5ML IV SOLN
INTRAVENOUS | Status: AC
  Filled 2016-10-22: qty 5

## 2016-10-22 MED ORDER — TECHNETIUM TC 99M TETROFOSMIN IV KIT
30.0000 | PACK | Freq: Once | INTRAVENOUS | Status: AC | PRN
  Administered 2016-10-22: 32.8 via INTRAVENOUS

## 2016-10-22 MED ORDER — SODIUM CHLORIDE 0.9% FLUSH
INTRAVENOUS | Status: AC
  Administered 2016-10-22: 10 mL via INTRAVENOUS
  Filled 2016-10-22: qty 10

## 2016-10-22 MED ORDER — TECHNETIUM TC 99M TETROFOSMIN IV KIT
10.0000 | PACK | Freq: Once | INTRAVENOUS | Status: AC | PRN
  Administered 2016-10-22: 10 via INTRAVENOUS

## 2016-10-25 ENCOUNTER — Telehealth: Payer: Self-pay | Admitting: Cardiovascular Disease

## 2016-10-25 NOTE — Telephone Encounter (Signed)
Patient notified. Routed to PCP 

## 2016-10-25 NOTE — Telephone Encounter (Signed)
-----   Message from Herminio Commons, MD sent at 10/22/2016 11:41 AM EDT ----- Normal.

## 2016-10-25 NOTE — Telephone Encounter (Signed)
Patient called requesting test results of recent stress test.

## 2016-10-29 DIAGNOSIS — D649 Anemia, unspecified: Secondary | ICD-10-CM | POA: Diagnosis not present

## 2016-10-29 DIAGNOSIS — C61 Malignant neoplasm of prostate: Secondary | ICD-10-CM | POA: Diagnosis not present

## 2016-11-04 DIAGNOSIS — M0609 Rheumatoid arthritis without rheumatoid factor, multiple sites: Secondary | ICD-10-CM | POA: Diagnosis not present

## 2016-11-19 DIAGNOSIS — M0609 Rheumatoid arthritis without rheumatoid factor, multiple sites: Secondary | ICD-10-CM | POA: Diagnosis not present

## 2016-12-04 ENCOUNTER — Ambulatory Visit: Payer: Medicare Other | Admitting: Cardiovascular Disease

## 2016-12-04 DIAGNOSIS — R05 Cough: Secondary | ICD-10-CM | POA: Diagnosis not present

## 2016-12-04 DIAGNOSIS — M069 Rheumatoid arthritis, unspecified: Secondary | ICD-10-CM | POA: Diagnosis not present

## 2016-12-04 DIAGNOSIS — Z6825 Body mass index (BMI) 25.0-25.9, adult: Secondary | ICD-10-CM | POA: Diagnosis not present

## 2016-12-04 DIAGNOSIS — D649 Anemia, unspecified: Secondary | ICD-10-CM | POA: Diagnosis not present

## 2016-12-04 DIAGNOSIS — R5383 Other fatigue: Secondary | ICD-10-CM | POA: Diagnosis not present

## 2016-12-11 DIAGNOSIS — M0600 Rheumatoid arthritis without rheumatoid factor, unspecified site: Secondary | ICD-10-CM | POA: Diagnosis not present

## 2016-12-11 DIAGNOSIS — M5136 Other intervertebral disc degeneration, lumbar region: Secondary | ICD-10-CM | POA: Diagnosis not present

## 2016-12-11 DIAGNOSIS — Z79899 Other long term (current) drug therapy: Secondary | ICD-10-CM | POA: Diagnosis not present

## 2016-12-11 DIAGNOSIS — M5030 Other cervical disc degeneration, unspecified cervical region: Secondary | ICD-10-CM | POA: Diagnosis not present

## 2016-12-11 DIAGNOSIS — Z6825 Body mass index (BMI) 25.0-25.9, adult: Secondary | ICD-10-CM | POA: Diagnosis not present

## 2016-12-11 DIAGNOSIS — Z8739 Personal history of other diseases of the musculoskeletal system and connective tissue: Secondary | ICD-10-CM | POA: Diagnosis not present

## 2016-12-11 DIAGNOSIS — E663 Overweight: Secondary | ICD-10-CM | POA: Diagnosis not present

## 2016-12-11 DIAGNOSIS — M2550 Pain in unspecified joint: Secondary | ICD-10-CM | POA: Diagnosis not present

## 2016-12-17 DIAGNOSIS — Z79899 Other long term (current) drug therapy: Secondary | ICD-10-CM | POA: Diagnosis not present

## 2016-12-17 DIAGNOSIS — M0609 Rheumatoid arthritis without rheumatoid factor, multiple sites: Secondary | ICD-10-CM | POA: Diagnosis not present

## 2016-12-20 DIAGNOSIS — R0602 Shortness of breath: Secondary | ICD-10-CM | POA: Diagnosis not present

## 2016-12-20 DIAGNOSIS — Z6825 Body mass index (BMI) 25.0-25.9, adult: Secondary | ICD-10-CM | POA: Diagnosis not present

## 2016-12-20 DIAGNOSIS — M069 Rheumatoid arthritis, unspecified: Secondary | ICD-10-CM | POA: Diagnosis not present

## 2016-12-20 DIAGNOSIS — Z86718 Personal history of other venous thrombosis and embolism: Secondary | ICD-10-CM | POA: Diagnosis not present

## 2016-12-20 DIAGNOSIS — M1990 Unspecified osteoarthritis, unspecified site: Secondary | ICD-10-CM | POA: Diagnosis not present

## 2016-12-20 DIAGNOSIS — R05 Cough: Secondary | ICD-10-CM | POA: Diagnosis not present

## 2016-12-20 DIAGNOSIS — Z8546 Personal history of malignant neoplasm of prostate: Secondary | ICD-10-CM | POA: Diagnosis not present

## 2016-12-21 DIAGNOSIS — R05 Cough: Secondary | ICD-10-CM | POA: Diagnosis not present

## 2016-12-25 ENCOUNTER — Encounter: Payer: Self-pay | Admitting: Cardiovascular Disease

## 2016-12-25 ENCOUNTER — Ambulatory Visit (INDEPENDENT_AMBULATORY_CARE_PROVIDER_SITE_OTHER): Payer: Medicare Other | Admitting: Cardiovascular Disease

## 2016-12-25 VITALS — BP 148/64 | HR 84 | Ht 69.0 in | Wt 172.0 lb

## 2016-12-25 DIAGNOSIS — R0602 Shortness of breath: Secondary | ICD-10-CM | POA: Diagnosis not present

## 2016-12-25 DIAGNOSIS — M069 Rheumatoid arthritis, unspecified: Secondary | ICD-10-CM

## 2016-12-25 DIAGNOSIS — R030 Elevated blood-pressure reading, without diagnosis of hypertension: Secondary | ICD-10-CM

## 2016-12-25 DIAGNOSIS — I2510 Atherosclerotic heart disease of native coronary artery without angina pectoris: Secondary | ICD-10-CM

## 2016-12-25 DIAGNOSIS — Z9221 Personal history of antineoplastic chemotherapy: Secondary | ICD-10-CM | POA: Diagnosis not present

## 2016-12-25 NOTE — Progress Notes (Signed)
SUBJECTIVE: The patient returns for follow-up after undergoing cardiovascular testing performed for the evaluation of exertional dyspnea.   Nuclear stress test on 10/22/16 was normal.  He has been off of methotrexate for 3 weeks.  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  He and his wife tell me that he has now undergone a VQ scan and a CT scan at [facility]. I do not have these results.   He is here with his wife who used to be the office manager for the orthopedic practice that used to be in our office building.   Family history: Mother had MI in her 92s. He has 2 maternal uncles who had MIs in their 59s. He has a brother with multiple sclerosis.  Review of Systems: As per "subjective", otherwise negative.  Allergies  Allergen Reactions  . Iodinated Diagnostic Agents Rash    Can tolerate IV dye with steroid protocol    Current Outpatient Prescriptions  Medication Sig Dispense Refill  . predniSONE (DELTASONE) 5 MG tablet Take 5 mg by mouth daily with breakfast. X 4 weeks for RA    . PRESCRIPTION MEDICATION Monthly injection for RA     No current facility-administered medications for this visit.     Past Medical History:  Diagnosis Date  . Anemia   . Arthritis    OA  . Diverticulitis    10 years ago  . DVT (deep venous thrombosis) ([place]) 04/09/2015   left leg- recently  . Pneumonia 13 years ago  . Rheumatoid arthritis [facility])     Past Surgical History:  Procedure Laterality Date  . [place]   lower   . 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 COLONOSCOPY  August 2013  . EYE SURGERY  2 years ago   both cataract  . JOINT REPLACEMENT  March 2012, 02/2012   right knee, redo right knee  . left middle finger surgery  March 2012  . PROSTATECTOMY  12/2013   at [facility]  . TONSILLECTOMY  60 years ago  . TOTAL KNEE ARTHROPLASTY Left 10/09/2015   Procedure: LEFT TOTAL KNEE  ARTHROPLASTY;  Surgeon: Gaynelle Arabian, MD;  Location: [facility];  Service: Orthopedics;  Laterality: Left;  . TOTAL KNEE REVISION  02/19/2012   Procedure: TOTAL KNEE REVISION;  Surgeon: [name], MD;  Location: [facility];  Service: Orthopedics;  Laterality: Right;    Social History   Social History  . Marital status: Married    Spouse name: N/A  . Number of children: N/A  . Years of education: N/A   Occupational History  . Not on file.   Social History Main Topics  . Smoking status: Former Smoker    Years: 5.00    Types: Cigars    Quit date: 01/27/2012  . Smokeless tobacco: Never Used     Comment: cigars twice a week  . Alcohol use 4.2 oz/week    7 Glasses of wine per week     Comment: 1 drink a night.  . Drug use: No  . Sexual activity: Not on file   Other Topics Concern  . Not on file   Social History Narrative  . No narrative on file     Vitals:   12/25/16 1043  BP: (!) 148/64  Pulse: 84  SpO2: 94%  Weight: 172 lb (78 kg)  Height: 5'  9" (1.753 m)    Wt Readings from Last 3 Encounters:  12/25/16 172 lb (78 kg)  10/17/16 179 lb (81.2 kg)  01/22/16 174 lb 11.2 oz (79.2 kg)     PHYSICAL EXAM General: NAD HEENT: Normal. Neck: No JVD, no thyromegaly. Lungs: Clear to auscultation bilaterally with normal respiratory effort. CV: Nondisplaced PMI.  Regular rate and rhythm, normal S1/S2, no S3/S4, no murmur. No pretibial or periankle edema.    Abdomen: Soft, nontender, no distention.  Neurologic: Alert and oriented.  Psych: Normal affect. Skin: Normal. Musculoskeletal: No gross deformities.    ECG: Most recent ECG reviewed.   Labs: Lab Results  Component Value Date/Time   K 3.8 10/11/2015 04:16 AM   BUN 13 10/11/2015 04:16 AM   CREATININE 0.74 10/11/2015 04:16 AM   ALT 17 09/26/2015 10:20 AM   HGB 9.4 (L) 10/11/2015 04:16 AM     Lipids: No results found for: LDLCALC, LDLDIRECT, CHOL, TRIG, HDL     ASSESSMENT AND PLAN: 1. Exertional dyspnea  and coronary artery calcifications: Nuclear stress test was normal as detailed above. Given his history of rheumatoid arthritis and use of methotrexate, he may have developed interstitial lung disease. He has reportedly undergone a VQ scan and a CT scan. I suspect pulmonary function testing will also be pursued.  2. Elevated blood pressure: He is on prednisone which can cause a rise in blood pressure. This will need to be monitored in order to make sure he has not developed hypertension.    Disposition: Follow up prn   Kate Sable, M.D., F.A.C.C.

## 2016-12-25 NOTE — Patient Instructions (Signed)
Your physician recommends that you schedule a follow-up appointment AS NEEDED WITH DR. KONESWARAN  Your physician recommends that you continue on your current medications as directed. Please refer to the Current Medication list given to you today.  Thank you for choosing  [facility]!!   

## 2016-12-31 DIAGNOSIS — L570 Actinic keratosis: Secondary | ICD-10-CM | POA: Diagnosis not present

## 2016-12-31 DIAGNOSIS — C44311 Basal cell carcinoma of skin of nose: Secondary | ICD-10-CM | POA: Diagnosis not present

## 2017-01-06 DIAGNOSIS — Z029 Encounter for administrative examinations, unspecified: Secondary | ICD-10-CM | POA: Diagnosis not present

## 2017-01-09 DIAGNOSIS — Z23 Encounter for immunization: Secondary | ICD-10-CM | POA: Diagnosis not present

## 2017-01-10 ENCOUNTER — Encounter: Payer: Self-pay | Admitting: Pulmonary Disease

## 2017-01-10 ENCOUNTER — Other Ambulatory Visit: Payer: Medicare Other

## 2017-01-10 ENCOUNTER — Ambulatory Visit (INDEPENDENT_AMBULATORY_CARE_PROVIDER_SITE_OTHER): Payer: Medicare Other | Admitting: Pulmonary Disease

## 2017-01-10 VITALS — BP 122/64 | HR 77 | Ht 69.0 in | Wt 173.2 lb

## 2017-01-10 DIAGNOSIS — R0602 Shortness of breath: Secondary | ICD-10-CM | POA: Diagnosis not present

## 2017-01-10 DIAGNOSIS — J849 Interstitial pulmonary disease, unspecified: Secondary | ICD-10-CM

## 2017-01-10 DIAGNOSIS — I2510 Atherosclerotic heart disease of native coronary artery without angina pectoris: Secondary | ICD-10-CM | POA: Diagnosis not present

## 2017-01-10 DIAGNOSIS — M069 Rheumatoid arthritis, unspecified: Secondary | ICD-10-CM

## 2017-01-10 NOTE — Progress Notes (Signed)
Subjective:    Patient ID: David Joseph, male    DOB: 01-15-1941, 76 y.o.   MRN: [id number]  Synopsis: Has a history of rheumatoid arthritis, referred in 2018 for evaluation of possible interstitial lung disease.  HPI Chief Complaint  Patient presents with  . pulmonary consult    coiugh SOB for months, no mucus production when he coughs, CT and VQ scan done, possible ILD from reports   David Joseph was referred to me for evaluation of dyspnea.  He says that three years ago he was seen by pulmonary due to prior pneumonia.  This was performed at [facility] by Dr. Idamae Schuller prior to prostate cancer.  He tells me his evaluation was normal.  He tells me today that he has a nagging cough that has persisted for 3-4 months.  It is dry and has not changed.    He has been developing shortness of breath for the last few months.   > This has improved after he stopped methotrexate. > he took a prednisone burst that helped as well about 4-6 weeks ago > he had a CXR performed which was apparently normal > In the middle of September he was in [place] and couldn't climb a flight of stairs.  He has a history of recurrent pneumonia: > he's had this 3-4 times as an adult > started in his 56's, some in the 60's > he wsa hospitalized at least once  He has been on methotrexate for hs RA which was diagnosed 2 years ago.  He is now on something called arencia every month.   > the RA causes him severe wrist swelling and pain > it's been pretty bad in the last few days so he was prescribed prednisone again > this is his first flare since stopping the methotrexate > he says that he thinks the flare was brought on by overuse of his hands a few days ago > he sees Dr. Trudie Reed for this  He may have smoked 50 cigarettes in his life, hasn't smoked one in 40 years.  He is a Special educational needs teacher.  He wears a mask and has a good dust collecting system.  No water damage in the basement.  They have a wood stove but they didn't use it  until yesterday (he was symptomatic in the winter).  They have been in the house for over 30 years.  No recent mold or mildew that they know about.      Past Medical History:  Diagnosis Date  . Anemia   . Arthritis    OA  . Diverticulitis    10 years ago  . DVT (deep venous thrombosis) ([place]) 04/09/2015   left leg- recently  . Pneumonia 13 years ago  . Rheumatoid arthritis ([place])      No family history on file.   Social History   Social History  . Marital status: Married    Spouse name: N/A  . Number of children: N/A  . Years of education: N/A   Occupational History  . Not on file.   Social History Main Topics  . Smoking status: Former Smoker    Years: 5.00    Types: Cigars    Quit date: 01/27/2012  . Smokeless tobacco: Never Used     Comment: cigars twice a week  . Alcohol use 4.2 oz/week    7 Glasses of wine per week     Comment: 1 drink a night.  . Drug use: No  . Sexual activity: Not  on file   Other Topics Concern  . Not on file   Social History Narrative  . No narrative on file     Allergies  Allergen Reactions  . Iodinated Diagnostic Agents Rash    Can tolerate IV dye with steroid protocol     Outpatient Medications Prior to Visit  Medication Sig Dispense Refill  . predniSONE (DELTASONE) 5 MG tablet Take 5 mg by mouth daily with breakfast. X 4 weeks for RA    . PRESCRIPTION MEDICATION Monthly injection for RA     No facility-administered medications prior to visit.       Review of Systems  Constitutional: Negative for fever and unexpected weight change.  HENT: Negative for congestion, dental problem, ear pain, nosebleeds, postnasal drip, rhinorrhea, sinus pressure, sneezing, sore throat and trouble swallowing.   Eyes: Negative for redness and itching.  Respiratory: Positive for cough and shortness of breath. Negative for chest tightness and wheezing.   Cardiovascular: Negative for palpitations and leg swelling.  Gastrointestinal: Negative  for nausea and vomiting.  Genitourinary: Negative for dysuria.  Musculoskeletal: Negative for joint swelling.  Skin: Negative for rash.  Neurological: Negative for headaches.  Hematological: Does not bruise/bleed easily.  Psychiatric/Behavioral: Negative for dysphoric mood. The patient is not nervous/anxious.        Objective:   Physical Exam Vitals:   01/10/17 1133  BP: 122/64  Pulse: 77  SpO2: 94%  Weight: 173 lb 3.2 oz (78.6 kg)  Height: 5\' 9"  (1.753 m)   Gen: well appearing, no acute distress HENT: NCAT, OP clear, neck supple without masses Eyes: PERRL, EOMi Lymph: no cervical lymphadenopathy PULM: Slight upper airway wheeze on left  CV: RRR, no mgr, no JVD GI: BS+, soft, nontender, no hsm Derm: no rash or skin breakdown MSK: normal bulk and tone, bilateral wrist effusion Neuro: A&Ox4, CN II-XII intact, strength 5/5 in all 4 extremities Psyche: normal mood and affect   Nuclear stress test: August 2018 low risk study, LVEF 64%  Chest imaging: 12/2016 CT chest images independently reviewed showing centrilobular groundglass nodules, nonspecific interstitial changes in the periphery and bases of both lungs, is patchy and mild at best. August 2018 nuclear medicine test low risk for pulmonary embolism.   Records reviewed from his recent visit with cardiology where he was evaluated for shortness of breath.  Noted to have a negative nuclear stress test.  Plans were made for a CT scan, pulmonary function test.     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  The CT scan that was performed earlier in the month showed upper lobe predominant centrilobular groundglass nodules with some nonspecific  groundglass changes in the periphery bilaterally.  This finding is worrisome for hypersensitivity pneumonitis, which is the typical disease pattern seen with methotrexate-induced lung toxicity.  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  However, tells me that his shortness of breath and cough have nearly completely gone away since stopping methotrexate approximately 1 month ago so I think that is the more likely cause.  The best way to diagnose hypersensitivity pneumonitis is an open lung biopsy.  Short  of that one can perform a high-resolution CT scan of the chest and a bronchoscopy for further evaluation.  I do not think any that is necessary right now though because he is feeling quite well.  Plan: Hypersensitivity pneumonitis panel Lung function tests now and again in 6 months He was instructed to call me if he develops worsening symptoms such as cough or shortness of breath, otherwise I anticipate he will continue to improve as long as he stays away from methotrexate.      Current Outpatient Prescriptions:  .  predniSONE (DELTASONE) 5 MG tablet, Take 5 mg by mouth daily with breakfast. X 4 weeks for RA, Disp: , Rfl:  .  PRESCRIPTION MEDICATION, Monthly injection for RA, Disp: , Rfl:

## 2017-01-10 NOTE — Patient Instructions (Signed)
Interstitial lung disease: Pattern on the CT scan is suggestive of hypersensitivity pneumonitis, though the only way to diagnose this would be to either perform a high-resolution CT scan of the chest and an invasive procedure.  It is possible that this may be related to the methotrexate you were taking, less likely that would you have been exposed to.  At this point I do not recommend anything further other than a breathing test now, some blood work to see if you have a reaction to anything in your environment.  If you develop worsening shortness of breath over the next 6 months please let me know  We will see you back in 6 months and get a pulmonary function test at that point.

## 2017-01-13 ENCOUNTER — Ambulatory Visit (INDEPENDENT_AMBULATORY_CARE_PROVIDER_SITE_OTHER): Payer: Medicare Other | Admitting: Pulmonary Disease

## 2017-01-13 DIAGNOSIS — R0602 Shortness of breath: Secondary | ICD-10-CM | POA: Diagnosis not present

## 2017-01-13 LAB — PULMONARY FUNCTION TEST
DL/VA % pred: 76 %
DL/VA: 3.5 ml/min/mmHg/L
DLCO COR % PRED: 65 %
DLCO UNC % PRED: 62 %
DLCO UNC: 19.84 ml/min/mmHg
DLCO cor: 20.7 ml/min/mmHg
FEF 25-75 POST: 5.18 L/s
FEF 25-75 PRE: 4.11 L/s
FEF2575-%Change-Post: 26 %
FEF2575-%PRED-POST: 235 %
FEF2575-%Pred-Pre: 186 %
FEV1-%Change-Post: 2 %
FEV1-%PRED-POST: 114 %
FEV1-%Pred-Pre: 112 %
FEV1-POST: 3.47 L
FEV1-PRE: 3.39 L
FEV1FVC-%Change-Post: 3 %
FEV1FVC-%PRED-PRE: 121 %
FEV6-%CHANGE-POST: -1 %
FEV6-%PRED-POST: 96 %
FEV6-%PRED-PRE: 98 %
FEV6-POST: 3.77 L
FEV6-Pre: 3.83 L
FEV6FVC-%PRED-POST: 106 %
FEV6FVC-%Pred-Pre: 106 %
FVC-%CHANGE-POST: -1 %
FVC-%PRED-PRE: 91 %
FVC-%Pred-Post: 90 %
FVC-POST: 3.79 L
FVC-Pre: 3.83 L
POST FEV6/FVC RATIO: 100 %
PRE FEV1/FVC RATIO: 88 %
Post FEV1/FVC ratio: 91 %
Pre FEV6/FVC Ratio: 100 %
RV % PRED: 89 %
RV: 2.25 L
TLC % PRED: 87 %
TLC: 6.11 L

## 2017-01-13 NOTE — Progress Notes (Signed)
PFT done today by Mattisen Pohlmann, CMA  

## 2017-01-14 DIAGNOSIS — M0609 Rheumatoid arthritis without rheumatoid factor, multiple sites: Secondary | ICD-10-CM | POA: Diagnosis not present

## 2017-01-15 LAB — HYPERSENSITIVITY PNEUMONITIS
A. PULLULANS ABS: NEGATIVE
A.Fumigatus #1 Abs: NEGATIVE
Micropolyspora faeni, IgG: NEGATIVE
PIGEON SERUM ABS: NEGATIVE
THERMOACTINOMYCES VULGARIS IGG: NEGATIVE
Thermoact. Saccharii: NEGATIVE

## 2017-01-16 NOTE — Progress Notes (Signed)
Spoke with pt and notified of results per Dr. Wert. Pt verbalized understanding and denied any questions. 

## 2017-02-03 DIAGNOSIS — D709 Neutropenia, unspecified: Secondary | ICD-10-CM | POA: Diagnosis not present

## 2017-02-03 DIAGNOSIS — C61 Malignant neoplasm of prostate: Secondary | ICD-10-CM | POA: Diagnosis not present

## 2017-02-03 DIAGNOSIS — D649 Anemia, unspecified: Secondary | ICD-10-CM | POA: Diagnosis not present

## 2017-02-03 DIAGNOSIS — R911 Solitary pulmonary nodule: Secondary | ICD-10-CM | POA: Diagnosis not present

## 2017-02-03 DIAGNOSIS — M069 Rheumatoid arthritis, unspecified: Secondary | ICD-10-CM | POA: Diagnosis not present

## 2017-02-10 DIAGNOSIS — M2550 Pain in unspecified joint: Secondary | ICD-10-CM | POA: Diagnosis not present

## 2017-02-10 DIAGNOSIS — M0600 Rheumatoid arthritis without rheumatoid factor, unspecified site: Secondary | ICD-10-CM | POA: Diagnosis not present

## 2017-02-10 DIAGNOSIS — Z79899 Other long term (current) drug therapy: Secondary | ICD-10-CM | POA: Diagnosis not present

## 2017-02-10 DIAGNOSIS — M5030 Other cervical disc degeneration, unspecified cervical region: Secondary | ICD-10-CM | POA: Diagnosis not present

## 2017-02-10 DIAGNOSIS — Z6826 Body mass index (BMI) 26.0-26.9, adult: Secondary | ICD-10-CM | POA: Diagnosis not present

## 2017-02-10 DIAGNOSIS — M5136 Other intervertebral disc degeneration, lumbar region: Secondary | ICD-10-CM | POA: Diagnosis not present

## 2017-02-10 DIAGNOSIS — E663 Overweight: Secondary | ICD-10-CM | POA: Diagnosis not present

## 2017-02-12 DIAGNOSIS — M0609 Rheumatoid arthritis without rheumatoid factor, multiple sites: Secondary | ICD-10-CM | POA: Diagnosis not present

## 2017-02-14 DIAGNOSIS — C61 Malignant neoplasm of prostate: Secondary | ICD-10-CM | POA: Diagnosis not present

## 2017-02-14 DIAGNOSIS — D649 Anemia, unspecified: Secondary | ICD-10-CM | POA: Diagnosis not present

## 2017-02-27 DIAGNOSIS — M0609 Rheumatoid arthritis without rheumatoid factor, multiple sites: Secondary | ICD-10-CM | POA: Diagnosis not present

## 2017-03-13 DIAGNOSIS — M0609 Rheumatoid arthritis without rheumatoid factor, multiple sites: Secondary | ICD-10-CM | POA: Diagnosis not present

## 2017-04-08 DIAGNOSIS — M0609 Rheumatoid arthritis without rheumatoid factor, multiple sites: Secondary | ICD-10-CM | POA: Diagnosis not present

## 2017-04-10 DIAGNOSIS — M0600 Rheumatoid arthritis without rheumatoid factor, unspecified site: Secondary | ICD-10-CM | POA: Diagnosis not present

## 2017-04-14 DIAGNOSIS — R911 Solitary pulmonary nodule: Secondary | ICD-10-CM | POA: Diagnosis not present

## 2017-04-14 DIAGNOSIS — C61 Malignant neoplasm of prostate: Secondary | ICD-10-CM | POA: Diagnosis not present

## 2017-04-18 DIAGNOSIS — D649 Anemia, unspecified: Secondary | ICD-10-CM | POA: Diagnosis not present

## 2017-04-18 DIAGNOSIS — M069 Rheumatoid arthritis, unspecified: Secondary | ICD-10-CM | POA: Diagnosis not present

## 2017-04-24 DIAGNOSIS — Z9225 Personal history of immunosupression therapy: Secondary | ICD-10-CM | POA: Diagnosis not present

## 2017-04-24 DIAGNOSIS — Z86718 Personal history of other venous thrombosis and embolism: Secondary | ICD-10-CM | POA: Diagnosis not present

## 2017-04-24 DIAGNOSIS — Z7952 Long term (current) use of systemic steroids: Secondary | ICD-10-CM | POA: Diagnosis not present

## 2017-04-24 DIAGNOSIS — Z888 Allergy status to other drugs, medicaments and biological substances status: Secondary | ICD-10-CM | POA: Diagnosis not present

## 2017-04-24 DIAGNOSIS — Z96651 Presence of right artificial knee joint: Secondary | ICD-10-CM | POA: Diagnosis not present

## 2017-04-24 DIAGNOSIS — M069 Rheumatoid arthritis, unspecified: Secondary | ICD-10-CM | POA: Diagnosis not present

## 2017-04-24 DIAGNOSIS — Z9079 Acquired absence of other genital organ(s): Secondary | ICD-10-CM | POA: Diagnosis not present

## 2017-04-24 DIAGNOSIS — D539 Nutritional anemia, unspecified: Secondary | ICD-10-CM | POA: Diagnosis not present

## 2017-04-24 DIAGNOSIS — Z8546 Personal history of malignant neoplasm of prostate: Secondary | ICD-10-CM | POA: Diagnosis not present

## 2017-04-24 DIAGNOSIS — Z79899 Other long term (current) drug therapy: Secondary | ICD-10-CM | POA: Diagnosis not present

## 2017-04-24 DIAGNOSIS — D472 Monoclonal gammopathy: Secondary | ICD-10-CM | POA: Diagnosis not present

## 2017-04-24 DIAGNOSIS — N529 Male erectile dysfunction, unspecified: Secondary | ICD-10-CM | POA: Diagnosis not present

## 2017-04-24 DIAGNOSIS — Z9289 Personal history of other medical treatment: Secondary | ICD-10-CM | POA: Diagnosis not present

## 2017-04-24 DIAGNOSIS — R17 Unspecified jaundice: Secondary | ICD-10-CM | POA: Diagnosis not present

## 2017-04-24 DIAGNOSIS — D531 Other megaloblastic anemias, not elsewhere classified: Secondary | ICD-10-CM | POA: Diagnosis not present

## 2017-04-24 DIAGNOSIS — R7989 Other specified abnormal findings of blood chemistry: Secondary | ICD-10-CM | POA: Diagnosis not present

## 2017-04-24 DIAGNOSIS — M1711 Unilateral primary osteoarthritis, right knee: Secondary | ICD-10-CM | POA: Diagnosis not present

## 2017-04-30 DIAGNOSIS — D508 Other iron deficiency anemias: Secondary | ICD-10-CM | POA: Diagnosis not present

## 2017-04-30 DIAGNOSIS — C61 Malignant neoplasm of prostate: Secondary | ICD-10-CM | POA: Diagnosis not present

## 2017-04-30 DIAGNOSIS — D649 Anemia, unspecified: Secondary | ICD-10-CM | POA: Diagnosis not present

## 2017-05-08 DIAGNOSIS — M0609 Rheumatoid arthritis without rheumatoid factor, multiple sites: Secondary | ICD-10-CM | POA: Diagnosis not present

## 2017-05-09 DIAGNOSIS — M069 Rheumatoid arthritis, unspecified: Secondary | ICD-10-CM | POA: Diagnosis not present

## 2017-05-09 DIAGNOSIS — D638 Anemia in other chronic diseases classified elsewhere: Secondary | ICD-10-CM | POA: Diagnosis not present

## 2017-05-12 DIAGNOSIS — K8020 Calculus of gallbladder without cholecystitis without obstruction: Secondary | ICD-10-CM | POA: Diagnosis not present

## 2017-05-12 DIAGNOSIS — N281 Cyst of kidney, acquired: Secondary | ICD-10-CM | POA: Diagnosis not present

## 2017-05-12 DIAGNOSIS — D531 Other megaloblastic anemias, not elsewhere classified: Secondary | ICD-10-CM | POA: Diagnosis not present

## 2017-05-13 DIAGNOSIS — Z08 Encounter for follow-up examination after completed treatment for malignant neoplasm: Secondary | ICD-10-CM | POA: Diagnosis not present

## 2017-05-13 DIAGNOSIS — R911 Solitary pulmonary nodule: Secondary | ICD-10-CM | POA: Diagnosis not present

## 2017-05-13 DIAGNOSIS — Z8546 Personal history of malignant neoplasm of prostate: Secondary | ICD-10-CM | POA: Diagnosis not present

## 2017-05-13 DIAGNOSIS — C61 Malignant neoplasm of prostate: Secondary | ICD-10-CM | POA: Diagnosis not present

## 2017-05-13 DIAGNOSIS — N5231 Erectile dysfunction following radical prostatectomy: Secondary | ICD-10-CM | POA: Diagnosis not present

## 2017-05-13 DIAGNOSIS — Z87891 Personal history of nicotine dependence: Secondary | ICD-10-CM | POA: Diagnosis not present

## 2017-05-13 DIAGNOSIS — Z125 Encounter for screening for malignant neoplasm of prostate: Secondary | ICD-10-CM | POA: Diagnosis not present

## 2017-05-23 DIAGNOSIS — D472 Monoclonal gammopathy: Secondary | ICD-10-CM | POA: Diagnosis not present

## 2017-05-23 DIAGNOSIS — M0579 Rheumatoid arthritis with rheumatoid factor of multiple sites without organ or systems involvement: Secondary | ICD-10-CM | POA: Diagnosis not present

## 2017-05-23 DIAGNOSIS — R7989 Other specified abnormal findings of blood chemistry: Secondary | ICD-10-CM | POA: Diagnosis not present

## 2017-05-23 DIAGNOSIS — D638 Anemia in other chronic diseases classified elsewhere: Secondary | ICD-10-CM | POA: Diagnosis not present

## 2017-05-23 DIAGNOSIS — D508 Other iron deficiency anemias: Secondary | ICD-10-CM | POA: Diagnosis not present

## 2017-05-23 DIAGNOSIS — C61 Malignant neoplasm of prostate: Secondary | ICD-10-CM | POA: Diagnosis not present

## 2017-05-23 DIAGNOSIS — D531 Other megaloblastic anemias, not elsewhere classified: Secondary | ICD-10-CM | POA: Diagnosis not present

## 2017-05-27 DIAGNOSIS — Z981 Arthrodesis status: Secondary | ICD-10-CM | POA: Diagnosis not present

## 2017-05-27 DIAGNOSIS — Z8546 Personal history of malignant neoplasm of prostate: Secondary | ICD-10-CM | POA: Diagnosis not present

## 2017-05-27 DIAGNOSIS — Z7952 Long term (current) use of systemic steroids: Secondary | ICD-10-CM | POA: Diagnosis not present

## 2017-05-27 DIAGNOSIS — Z8261 Family history of arthritis: Secondary | ICD-10-CM | POA: Diagnosis not present

## 2017-05-27 DIAGNOSIS — D649 Anemia, unspecified: Secondary | ICD-10-CM | POA: Diagnosis not present

## 2017-05-27 DIAGNOSIS — M25532 Pain in left wrist: Secondary | ICD-10-CM | POA: Diagnosis not present

## 2017-05-27 DIAGNOSIS — Z888 Allergy status to other drugs, medicaments and biological substances status: Secondary | ICD-10-CM | POA: Diagnosis not present

## 2017-05-27 DIAGNOSIS — M152 Bouchard's nodes (with arthropathy): Secondary | ICD-10-CM | POA: Diagnosis not present

## 2017-05-27 DIAGNOSIS — G8929 Other chronic pain: Secondary | ICD-10-CM | POA: Diagnosis not present

## 2017-05-27 DIAGNOSIS — Z91041 Radiographic dye allergy status: Secondary | ICD-10-CM | POA: Diagnosis not present

## 2017-05-27 DIAGNOSIS — M130 Polyarthritis, unspecified: Secondary | ICD-10-CM | POA: Diagnosis not present

## 2017-05-27 DIAGNOSIS — M19071 Primary osteoarthritis, right ankle and foot: Secondary | ICD-10-CM | POA: Diagnosis not present

## 2017-05-27 DIAGNOSIS — M25531 Pain in right wrist: Secondary | ICD-10-CM | POA: Diagnosis not present

## 2017-05-27 DIAGNOSIS — M19072 Primary osteoarthritis, left ankle and foot: Secondary | ICD-10-CM | POA: Diagnosis not present

## 2017-05-27 DIAGNOSIS — M7989 Other specified soft tissue disorders: Secondary | ICD-10-CM | POA: Diagnosis not present

## 2017-06-02 DIAGNOSIS — Z6826 Body mass index (BMI) 26.0-26.9, adult: Secondary | ICD-10-CM | POA: Diagnosis not present

## 2017-06-02 DIAGNOSIS — D508 Other iron deficiency anemias: Secondary | ICD-10-CM | POA: Diagnosis not present

## 2017-06-02 DIAGNOSIS — J189 Pneumonia, unspecified organism: Secondary | ICD-10-CM | POA: Diagnosis not present

## 2017-06-13 ENCOUNTER — Other Ambulatory Visit: Payer: Self-pay | Admitting: Pulmonary Disease

## 2017-06-13 DIAGNOSIS — J849 Interstitial pulmonary disease, unspecified: Secondary | ICD-10-CM

## 2017-06-16 ENCOUNTER — Ambulatory Visit (INDEPENDENT_AMBULATORY_CARE_PROVIDER_SITE_OTHER): Payer: Medicare Other | Admitting: Pulmonary Disease

## 2017-06-16 VITALS — BP 128/64 | HR 79 | Ht 69.5 in | Wt 173.0 lb

## 2017-06-16 DIAGNOSIS — J849 Interstitial pulmonary disease, unspecified: Secondary | ICD-10-CM

## 2017-06-16 LAB — PULMONARY FUNCTION TEST
DL/VA % PRED: 62 %
DL/VA: 2.85 ml/min/mmHg/L
DLCO unc % pred: 65 %
DLCO unc: 20.77 ml/min/mmHg
FEF 25-75 POST: 5.1 L/s
FEF 25-75 Pre: 4.56 L/sec
FEF2575-%Change-Post: 12 %
FEF2575-%PRED-POST: 234 %
FEF2575-%Pred-Pre: 208 %
FEV1-%CHANGE-POST: 2 %
FEV1-%PRED-PRE: 106 %
FEV1-%Pred-Post: 108 %
FEV1-PRE: 3.18 L
FEV1-Post: 3.26 L
FEV1FVC-%CHANGE-POST: 3 %
FEV1FVC-%Pred-Pre: 121 %
FEV6-%Change-Post: 0 %
FEV6-%Pred-Post: 91 %
FEV6-%Pred-Pre: 92 %
FEV6-POST: 3.57 L
FEV6-Pre: 3.6 L
FEV6FVC-%PRED-POST: 106 %
FEV6FVC-%Pred-Pre: 106 %
FVC-%Change-Post: 0 %
FVC-%PRED-POST: 86 %
FVC-%PRED-PRE: 86 %
FVC-POST: 3.57 L
FVC-PRE: 3.6 L
POST FEV6/FVC RATIO: 100 %
PRE FEV1/FVC RATIO: 88 %
PRE FEV6/FVC RATIO: 100 %
Post FEV1/FVC ratio: 91 %
RV % pred: 85 %
RV: 2.16 L
TLC % pred: 87 %
TLC: 6.06 L

## 2017-06-16 NOTE — Progress Notes (Signed)
Completed full PFT today.

## 2017-06-16 NOTE — Patient Instructions (Signed)
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

## 2017-06-16 NOTE — Progress Notes (Signed)
Subjective:    Patient ID: David Joseph, male    DOB: 07-11-1940, 77 y.o.   MRN: [id number]  Synopsis: Came in 12/2016, this is now in question after assessment at [facility], referred in 2018 for evaluation of possible interstitial lung disease.  HPI Chief Complaint  Patient presents with  . Follow-up    review PFT.  pt c/o some sinus congestion but states he is overall doing well.    Arcenio has ben off of Methotrexate since the last visit.  His dyspnea and fatigue have improved since then.  He says that he actually went for a second opinion at [facility] and he has been told that he doesn't have RA.  He is supposed to be seen for a bone marrow biopsy soon.  They tell him that he does not have rheumatoid arthritis.  He is not taking methotrexate anymore.  He says that he really does not have any shortness of breath or cough.  He is got a little bit of a cough right now because of some sinus congestion he is developed from dust exposure but he does not attribute that to a lung problem.  He says his energy level has improved significantly since stopping the methotrexate    Past Medical History:  Diagnosis Date  . Anemia   . Arthritis    OA  . Diverticulitis    10 years ago  . DVT (deep venous thrombosis) ([place]) 04/09/2015   left leg- recently  . Pneumonia 13 years ago  . Rheumatoid arthritis ([place])      Review of Systems  Constitutional: Negative for fever and unexpected weight change.  HENT: Negative for congestion, dental problem, ear pain, nosebleeds, postnasal drip, rhinorrhea, sinus pressure, sneezing, sore throat and trouble swallowing.   Eyes: Negative for redness and itching.  Respiratory: Positive for cough and shortness of breath. Negative for chest tightness and wheezing.   Cardiovascular: Negative for palpitations and leg swelling.  Gastrointestinal: Negative for nausea and vomiting.  Genitourinary: Negative for dysuria.  Musculoskeletal: Negative for joint swelling.   Skin: Negative for rash.  Neurological: Negative for headaches.  Hematological: Does not bruise/bleed easily.  Psychiatric/Behavioral: Negative for dysphoric mood. The patient is not nervous/anxious.        Objective:   Physical Exam Vitals:   06/16/17 0959  BP: 128/64  Pulse: 79  SpO2: 99%  Weight: 173 lb (78.5 kg)  Height: 5' 9.5" (1.765 m)    Gen: well appearing HENT: OP clear, TM's clear, neck supple PULM: CTA B, normal percussion CV: RRR, no mgr, trace edema GI: BS+, soft, nontender Derm: no cyanosis or rash Psyche: normal mood and affect    Nuclear stress test: August 2018 low risk study, LVEF 64%  Pulmonary function testing: 12/2016 ratio norma, FVC 3.83L (91% pred), TLC 6.1L 87% pred, DLCO 19.87(62% pred).  April 2019 ratio normal, FVC 3.6 L 86% predicted, total lung capacity 6.1 L 87% predicted, DLCO 20.8 mL 65% predicted  Chest imaging: 12/2016 CT chest images independently reviewed showing centrilobular groundglass nodules, nonspecific interstitial changes in the periphery and bases of both lungs, is patchy and mild at best. August 2018 nuclear medicine test low risk for pulmonary embolism.   Records reviewed from his recent visit with cardiology where he was evaluated for shortness of breath.  Noted to have a negative nuclear stress test.  Plans were made for a CT scan, pulmonary function test.     Assessment & Plan:   ILD (interstitial  lung disease) ([employer])  Discussion: Today's lung function testing showed mild improvement in the diffusion capacity and stability in his total lung capacity.  All parameters are normal and he has no signs or symptoms of an underlying lung problem right now.  I think that the hypersensitivity pneumonitis we saw from his CT scan in the fall was due to methotrexate.  He does have high would dust exposure and as stated previously red cedar has been implicated in hypersensitivity pneumonitis in the past.  He sometimes works with  this.  However, at this point there is little evidence that he has a lung disease.  If he develops shortness of breath or cough with mucus production he should come to see me sooner.  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  predniSONE (DELTASONE) 5 MG tablet, Take 10 mg by mouth daily with breakfast., Disp: , Rfl:

## 2017-06-17 DIAGNOSIS — D469 Myelodysplastic syndrome, unspecified: Secondary | ICD-10-CM | POA: Diagnosis not present

## 2017-06-18 DIAGNOSIS — C4442 Squamous cell carcinoma of skin of scalp and neck: Secondary | ICD-10-CM | POA: Diagnosis not present

## 2017-06-19 DIAGNOSIS — M25532 Pain in left wrist: Secondary | ICD-10-CM | POA: Diagnosis not present

## 2017-06-19 DIAGNOSIS — M25431 Effusion, right wrist: Secondary | ICD-10-CM | POA: Diagnosis not present

## 2017-06-19 DIAGNOSIS — M25432 Effusion, left wrist: Secondary | ICD-10-CM | POA: Diagnosis not present

## 2017-06-19 DIAGNOSIS — M25531 Pain in right wrist: Secondary | ICD-10-CM | POA: Diagnosis not present

## 2017-06-24 DIAGNOSIS — Z87891 Personal history of nicotine dependence: Secondary | ICD-10-CM | POA: Diagnosis not present

## 2017-06-24 DIAGNOSIS — M19032 Primary osteoarthritis, left wrist: Secondary | ICD-10-CM | POA: Diagnosis not present

## 2017-06-24 DIAGNOSIS — Z9079 Acquired absence of other genital organ(s): Secondary | ICD-10-CM | POA: Diagnosis not present

## 2017-06-24 DIAGNOSIS — R0602 Shortness of breath: Secondary | ICD-10-CM | POA: Diagnosis not present

## 2017-06-24 DIAGNOSIS — M064 Inflammatory polyarthropathy: Secondary | ICD-10-CM | POA: Diagnosis not present

## 2017-06-24 DIAGNOSIS — Z7952 Long term (current) use of systemic steroids: Secondary | ICD-10-CM | POA: Diagnosis not present

## 2017-06-24 DIAGNOSIS — M19031 Primary osteoarthritis, right wrist: Secondary | ICD-10-CM | POA: Diagnosis not present

## 2017-06-24 DIAGNOSIS — Z8546 Personal history of malignant neoplasm of prostate: Secondary | ICD-10-CM | POA: Diagnosis not present

## 2017-06-24 DIAGNOSIS — D469 Myelodysplastic syndrome, unspecified: Secondary | ICD-10-CM | POA: Diagnosis not present

## 2017-06-24 DIAGNOSIS — Z86718 Personal history of other venous thrombosis and embolism: Secondary | ICD-10-CM | POA: Diagnosis not present

## 2017-06-24 DIAGNOSIS — D539 Nutritional anemia, unspecified: Secondary | ICD-10-CM | POA: Diagnosis not present

## 2017-06-24 DIAGNOSIS — R5383 Other fatigue: Secondary | ICD-10-CM | POA: Diagnosis not present

## 2017-06-25 DIAGNOSIS — C4442 Squamous cell carcinoma of skin of scalp and neck: Secondary | ICD-10-CM | POA: Diagnosis not present

## 2017-07-01 DIAGNOSIS — D531 Other megaloblastic anemias, not elsewhere classified: Secondary | ICD-10-CM | POA: Diagnosis not present

## 2017-07-01 DIAGNOSIS — Z85828 Personal history of other malignant neoplasm of skin: Secondary | ICD-10-CM | POA: Diagnosis not present

## 2017-07-01 DIAGNOSIS — L570 Actinic keratosis: Secondary | ICD-10-CM | POA: Diagnosis not present

## 2017-07-01 DIAGNOSIS — D472 Monoclonal gammopathy: Secondary | ICD-10-CM | POA: Diagnosis not present

## 2017-08-05 DIAGNOSIS — M1710 Unilateral primary osteoarthritis, unspecified knee: Secondary | ICD-10-CM | POA: Diagnosis not present

## 2017-08-05 DIAGNOSIS — Z981 Arthrodesis status: Secondary | ICD-10-CM | POA: Diagnosis not present

## 2017-08-05 DIAGNOSIS — Z91041 Radiographic dye allergy status: Secondary | ICD-10-CM | POA: Diagnosis not present

## 2017-08-05 DIAGNOSIS — Z888 Allergy status to other drugs, medicaments and biological substances status: Secondary | ICD-10-CM | POA: Diagnosis not present

## 2017-08-05 DIAGNOSIS — D649 Anemia, unspecified: Secondary | ICD-10-CM | POA: Diagnosis not present

## 2017-08-05 DIAGNOSIS — M0579 Rheumatoid arthritis with rheumatoid factor of multiple sites without organ or systems involvement: Secondary | ICD-10-CM | POA: Diagnosis not present

## 2017-08-05 DIAGNOSIS — D689 Coagulation defect, unspecified: Secondary | ICD-10-CM | POA: Diagnosis not present

## 2017-08-05 DIAGNOSIS — Z7952 Long term (current) use of systemic steroids: Secondary | ICD-10-CM | POA: Diagnosis not present

## 2017-08-05 DIAGNOSIS — M436 Torticollis: Secondary | ICD-10-CM | POA: Diagnosis not present

## 2017-08-05 DIAGNOSIS — M1180 Other specified crystal arthropathies, unspecified site: Secondary | ICD-10-CM | POA: Diagnosis not present

## 2017-08-05 DIAGNOSIS — M064 Inflammatory polyarthropathy: Secondary | ICD-10-CM | POA: Diagnosis not present

## 2017-09-01 DIAGNOSIS — D472 Monoclonal gammopathy: Secondary | ICD-10-CM | POA: Diagnosis not present

## 2017-09-01 DIAGNOSIS — D531 Other megaloblastic anemias, not elsewhere classified: Secondary | ICD-10-CM | POA: Diagnosis not present

## 2017-09-03 DIAGNOSIS — M1990 Unspecified osteoarthritis, unspecified site: Secondary | ICD-10-CM | POA: Diagnosis not present

## 2017-09-03 DIAGNOSIS — Z7952 Long term (current) use of systemic steroids: Secondary | ICD-10-CM | POA: Diagnosis not present

## 2017-09-10 DIAGNOSIS — R739 Hyperglycemia, unspecified: Secondary | ICD-10-CM | POA: Diagnosis not present

## 2017-09-16 DIAGNOSIS — M4312 Spondylolisthesis, cervical region: Secondary | ICD-10-CM | POA: Diagnosis not present

## 2017-09-16 DIAGNOSIS — Z7952 Long term (current) use of systemic steroids: Secondary | ICD-10-CM | POA: Diagnosis not present

## 2017-09-16 DIAGNOSIS — Z91041 Radiographic dye allergy status: Secondary | ICD-10-CM | POA: Diagnosis not present

## 2017-09-16 DIAGNOSIS — Z96653 Presence of artificial knee joint, bilateral: Secondary | ICD-10-CM | POA: Diagnosis not present

## 2017-09-16 DIAGNOSIS — M064 Inflammatory polyarthropathy: Secondary | ICD-10-CM | POA: Diagnosis not present

## 2017-09-16 DIAGNOSIS — Z8546 Personal history of malignant neoplasm of prostate: Secondary | ICD-10-CM | POA: Diagnosis not present

## 2017-09-16 DIAGNOSIS — Z7984 Long term (current) use of oral hypoglycemic drugs: Secondary | ICD-10-CM | POA: Diagnosis not present

## 2017-09-16 DIAGNOSIS — R509 Fever, unspecified: Secondary | ICD-10-CM | POA: Diagnosis not present

## 2017-09-16 DIAGNOSIS — L905 Scar conditions and fibrosis of skin: Secondary | ICD-10-CM | POA: Diagnosis not present

## 2017-09-16 DIAGNOSIS — Z8739 Personal history of other diseases of the musculoskeletal system and connective tissue: Secondary | ICD-10-CM | POA: Diagnosis not present

## 2017-09-16 DIAGNOSIS — M5030 Other cervical disc degeneration, unspecified cervical region: Secondary | ICD-10-CM | POA: Diagnosis not present

## 2017-09-16 DIAGNOSIS — Z79899 Other long term (current) drug therapy: Secondary | ICD-10-CM | POA: Diagnosis not present

## 2017-09-16 DIAGNOSIS — D689 Coagulation defect, unspecified: Secondary | ICD-10-CM | POA: Diagnosis not present

## 2017-09-16 DIAGNOSIS — D649 Anemia, unspecified: Secondary | ICD-10-CM | POA: Diagnosis not present

## 2017-09-16 DIAGNOSIS — Z981 Arthrodesis status: Secondary | ICD-10-CM | POA: Diagnosis not present

## 2017-09-16 DIAGNOSIS — M1180 Other specified crystal arthropathies, unspecified site: Secondary | ICD-10-CM | POA: Diagnosis not present

## 2017-09-22 DIAGNOSIS — Z7952 Long term (current) use of systemic steroids: Secondary | ICD-10-CM | POA: Diagnosis not present

## 2017-09-22 DIAGNOSIS — M1990 Unspecified osteoarthritis, unspecified site: Secondary | ICD-10-CM | POA: Diagnosis not present

## 2017-10-17 DIAGNOSIS — H43811 Vitreous degeneration, right eye: Secondary | ICD-10-CM | POA: Diagnosis not present

## 2017-10-17 DIAGNOSIS — E119 Type 2 diabetes mellitus without complications: Secondary | ICD-10-CM | POA: Diagnosis not present

## 2017-10-17 DIAGNOSIS — Z961 Presence of intraocular lens: Secondary | ICD-10-CM | POA: Diagnosis not present

## 2017-10-17 DIAGNOSIS — M1180 Other specified crystal arthropathies, unspecified site: Secondary | ICD-10-CM | POA: Diagnosis not present

## 2017-10-17 DIAGNOSIS — Z79899 Other long term (current) drug therapy: Secondary | ICD-10-CM | POA: Diagnosis not present

## 2017-10-27 DIAGNOSIS — Z7952 Long term (current) use of systemic steroids: Secondary | ICD-10-CM | POA: Diagnosis not present

## 2017-10-27 DIAGNOSIS — D469 Myelodysplastic syndrome, unspecified: Secondary | ICD-10-CM | POA: Diagnosis not present

## 2017-10-27 DIAGNOSIS — D709 Neutropenia, unspecified: Secondary | ICD-10-CM | POA: Diagnosis not present

## 2017-10-27 DIAGNOSIS — D531 Other megaloblastic anemias, not elsewhere classified: Secondary | ICD-10-CM | POA: Diagnosis not present

## 2017-10-27 DIAGNOSIS — M1990 Unspecified osteoarthritis, unspecified site: Secondary | ICD-10-CM | POA: Diagnosis not present

## 2017-11-03 DIAGNOSIS — Z7952 Long term (current) use of systemic steroids: Secondary | ICD-10-CM | POA: Diagnosis not present

## 2017-11-03 DIAGNOSIS — D531 Other megaloblastic anemias, not elsewhere classified: Secondary | ICD-10-CM | POA: Diagnosis not present

## 2017-11-03 DIAGNOSIS — R7309 Other abnormal glucose: Secondary | ICD-10-CM | POA: Diagnosis not present

## 2017-11-03 DIAGNOSIS — M1990 Unspecified osteoarthritis, unspecified site: Secondary | ICD-10-CM | POA: Diagnosis not present

## 2017-11-06 DIAGNOSIS — D531 Other megaloblastic anemias, not elsewhere classified: Secondary | ICD-10-CM | POA: Diagnosis not present

## 2017-11-06 DIAGNOSIS — R7309 Other abnormal glucose: Secondary | ICD-10-CM | POA: Diagnosis not present

## 2017-11-07 DIAGNOSIS — K219 Gastro-esophageal reflux disease without esophagitis: Secondary | ICD-10-CM | POA: Diagnosis not present

## 2017-11-07 DIAGNOSIS — Z6824 Body mass index (BMI) 24.0-24.9, adult: Secondary | ICD-10-CM | POA: Diagnosis not present

## 2017-11-07 DIAGNOSIS — R739 Hyperglycemia, unspecified: Secondary | ICD-10-CM | POA: Diagnosis not present

## 2017-11-07 DIAGNOSIS — D531 Other megaloblastic anemias, not elsewhere classified: Secondary | ICD-10-CM | POA: Diagnosis not present

## 2017-11-07 DIAGNOSIS — R509 Fever, unspecified: Secondary | ICD-10-CM | POA: Diagnosis not present

## 2017-11-07 DIAGNOSIS — R7309 Other abnormal glucose: Secondary | ICD-10-CM | POA: Diagnosis not present

## 2017-11-11 DIAGNOSIS — M1990 Unspecified osteoarthritis, unspecified site: Secondary | ICD-10-CM | POA: Diagnosis not present

## 2017-11-11 DIAGNOSIS — Z8546 Personal history of malignant neoplasm of prostate: Secondary | ICD-10-CM | POA: Diagnosis not present

## 2017-11-11 DIAGNOSIS — Z7952 Long term (current) use of systemic steroids: Secondary | ICD-10-CM | POA: Diagnosis not present

## 2017-11-11 DIAGNOSIS — Z794 Long term (current) use of insulin: Secondary | ICD-10-CM | POA: Diagnosis not present

## 2017-11-11 DIAGNOSIS — Z86718 Personal history of other venous thrombosis and embolism: Secondary | ICD-10-CM | POA: Diagnosis not present

## 2017-11-11 DIAGNOSIS — N529 Male erectile dysfunction, unspecified: Secondary | ICD-10-CM | POA: Diagnosis not present

## 2017-11-11 DIAGNOSIS — M069 Rheumatoid arthritis, unspecified: Secondary | ICD-10-CM | POA: Diagnosis not present

## 2017-11-11 DIAGNOSIS — Z08 Encounter for follow-up examination after completed treatment for malignant neoplasm: Secondary | ICD-10-CM | POA: Diagnosis not present

## 2017-11-11 DIAGNOSIS — Z79899 Other long term (current) drug therapy: Secondary | ICD-10-CM | POA: Diagnosis not present

## 2017-11-11 DIAGNOSIS — D531 Other megaloblastic anemias, not elsewhere classified: Secondary | ICD-10-CM | POA: Diagnosis not present

## 2017-11-11 DIAGNOSIS — K8020 Calculus of gallbladder without cholecystitis without obstruction: Secondary | ICD-10-CM | POA: Diagnosis not present

## 2017-11-11 DIAGNOSIS — D7589 Other specified diseases of blood and blood-forming organs: Secondary | ICD-10-CM | POA: Diagnosis not present

## 2017-11-11 DIAGNOSIS — R739 Hyperglycemia, unspecified: Secondary | ICD-10-CM | POA: Diagnosis not present

## 2017-11-11 DIAGNOSIS — D539 Nutritional anemia, unspecified: Secondary | ICD-10-CM | POA: Diagnosis not present

## 2017-11-13 DIAGNOSIS — D531 Other megaloblastic anemias, not elsewhere classified: Secondary | ICD-10-CM | POA: Diagnosis not present

## 2017-11-28 DIAGNOSIS — D531 Other megaloblastic anemias, not elsewhere classified: Secondary | ICD-10-CM | POA: Diagnosis not present

## 2017-11-28 DIAGNOSIS — D6859 Other primary thrombophilia: Secondary | ICD-10-CM | POA: Diagnosis not present

## 2017-11-28 DIAGNOSIS — N529 Male erectile dysfunction, unspecified: Secondary | ICD-10-CM | POA: Diagnosis not present

## 2017-11-28 DIAGNOSIS — C61 Malignant neoplasm of prostate: Secondary | ICD-10-CM | POA: Diagnosis not present

## 2017-11-28 DIAGNOSIS — Z79899 Other long term (current) drug therapy: Secondary | ICD-10-CM | POA: Diagnosis not present

## 2017-11-28 DIAGNOSIS — Z86718 Personal history of other venous thrombosis and embolism: Secondary | ICD-10-CM | POA: Diagnosis not present

## 2017-11-28 DIAGNOSIS — K8020 Calculus of gallbladder without cholecystitis without obstruction: Secondary | ICD-10-CM | POA: Diagnosis not present

## 2017-11-28 DIAGNOSIS — M069 Rheumatoid arthritis, unspecified: Secondary | ICD-10-CM | POA: Diagnosis not present

## 2017-12-12 DIAGNOSIS — Z9079 Acquired absence of other genital organ(s): Secondary | ICD-10-CM | POA: Diagnosis not present

## 2017-12-12 DIAGNOSIS — Z7952 Long term (current) use of systemic steroids: Secondary | ICD-10-CM | POA: Diagnosis not present

## 2017-12-12 DIAGNOSIS — Z5333 Arthroscopic surgical procedure converted to open procedure: Secondary | ICD-10-CM | POA: Diagnosis not present

## 2017-12-12 DIAGNOSIS — M06032 Rheumatoid arthritis without rheumatoid factor, left wrist: Secondary | ICD-10-CM | POA: Diagnosis not present

## 2017-12-12 DIAGNOSIS — Z79899 Other long term (current) drug therapy: Secondary | ICD-10-CM | POA: Diagnosis not present

## 2017-12-12 DIAGNOSIS — Z86718 Personal history of other venous thrombosis and embolism: Secondary | ICD-10-CM | POA: Diagnosis not present

## 2017-12-12 DIAGNOSIS — M79642 Pain in left hand: Secondary | ICD-10-CM | POA: Diagnosis not present

## 2017-12-12 DIAGNOSIS — N529 Male erectile dysfunction, unspecified: Secondary | ICD-10-CM | POA: Diagnosis not present

## 2017-12-12 DIAGNOSIS — Z9225 Personal history of immunosupression therapy: Secondary | ICD-10-CM | POA: Diagnosis not present

## 2017-12-12 DIAGNOSIS — M79641 Pain in right hand: Secondary | ICD-10-CM | POA: Diagnosis not present

## 2017-12-12 DIAGNOSIS — D531 Other megaloblastic anemias, not elsewhere classified: Secondary | ICD-10-CM | POA: Diagnosis not present

## 2017-12-12 DIAGNOSIS — D472 Monoclonal gammopathy: Secondary | ICD-10-CM | POA: Diagnosis not present

## 2017-12-12 DIAGNOSIS — R739 Hyperglycemia, unspecified: Secondary | ICD-10-CM | POA: Diagnosis not present

## 2017-12-12 DIAGNOSIS — M1990 Unspecified osteoarthritis, unspecified site: Secondary | ICD-10-CM | POA: Diagnosis not present

## 2017-12-12 DIAGNOSIS — Z91041 Radiographic dye allergy status: Secondary | ICD-10-CM | POA: Diagnosis not present

## 2017-12-12 DIAGNOSIS — D649 Anemia, unspecified: Secondary | ICD-10-CM | POA: Diagnosis not present

## 2017-12-12 DIAGNOSIS — Z5189 Encounter for other specified aftercare: Secondary | ICD-10-CM | POA: Diagnosis not present

## 2017-12-12 DIAGNOSIS — Z8546 Personal history of malignant neoplasm of prostate: Secondary | ICD-10-CM | POA: Diagnosis not present

## 2017-12-12 DIAGNOSIS — M7989 Other specified soft tissue disorders: Secondary | ICD-10-CM | POA: Diagnosis not present

## 2017-12-12 DIAGNOSIS — K8020 Calculus of gallbladder without cholecystitis without obstruction: Secondary | ICD-10-CM | POA: Diagnosis not present

## 2017-12-12 DIAGNOSIS — M06031 Rheumatoid arthritis without rheumatoid factor, right wrist: Secondary | ICD-10-CM | POA: Diagnosis not present

## 2017-12-15 DIAGNOSIS — D531 Other megaloblastic anemias, not elsewhere classified: Secondary | ICD-10-CM | POA: Diagnosis not present

## 2017-12-15 DIAGNOSIS — D649 Anemia, unspecified: Secondary | ICD-10-CM | POA: Diagnosis not present

## 2017-12-16 DIAGNOSIS — D531 Other megaloblastic anemias, not elsewhere classified: Secondary | ICD-10-CM | POA: Diagnosis not present

## 2017-12-18 DIAGNOSIS — R739 Hyperglycemia, unspecified: Secondary | ICD-10-CM | POA: Diagnosis not present

## 2017-12-18 DIAGNOSIS — R5383 Other fatigue: Secondary | ICD-10-CM | POA: Diagnosis not present

## 2017-12-18 DIAGNOSIS — Z6825 Body mass index (BMI) 25.0-25.9, adult: Secondary | ICD-10-CM | POA: Diagnosis not present

## 2017-12-19 DIAGNOSIS — Z5333 Arthroscopic surgical procedure converted to open procedure: Secondary | ICD-10-CM | POA: Diagnosis not present

## 2017-12-19 DIAGNOSIS — Z5189 Encounter for other specified aftercare: Secondary | ICD-10-CM | POA: Diagnosis not present

## 2017-12-19 DIAGNOSIS — D469 Myelodysplastic syndrome, unspecified: Secondary | ICD-10-CM | POA: Diagnosis not present

## 2017-12-19 DIAGNOSIS — D4989 Neoplasm of unspecified behavior of other specified sites: Secondary | ICD-10-CM | POA: Diagnosis not present

## 2017-12-19 DIAGNOSIS — D531 Other megaloblastic anemias, not elsewhere classified: Secondary | ICD-10-CM | POA: Diagnosis not present

## 2017-12-19 DIAGNOSIS — D649 Anemia, unspecified: Secondary | ICD-10-CM | POA: Diagnosis not present

## 2017-12-30 DIAGNOSIS — L570 Actinic keratosis: Secondary | ICD-10-CM | POA: Diagnosis not present

## 2018-01-06 DIAGNOSIS — D472 Monoclonal gammopathy: Secondary | ICD-10-CM | POA: Diagnosis not present

## 2018-01-06 DIAGNOSIS — Z79899 Other long term (current) drug therapy: Secondary | ICD-10-CM | POA: Diagnosis not present

## 2018-01-06 DIAGNOSIS — Z91041 Radiographic dye allergy status: Secondary | ICD-10-CM | POA: Diagnosis not present

## 2018-01-06 DIAGNOSIS — M064 Inflammatory polyarthropathy: Secondary | ICD-10-CM | POA: Diagnosis not present

## 2018-01-06 DIAGNOSIS — M1990 Unspecified osteoarthritis, unspecified site: Secondary | ICD-10-CM | POA: Diagnosis not present

## 2018-01-06 DIAGNOSIS — N529 Male erectile dysfunction, unspecified: Secondary | ICD-10-CM | POA: Diagnosis not present

## 2018-01-06 DIAGNOSIS — D469 Myelodysplastic syndrome, unspecified: Secondary | ICD-10-CM | POA: Diagnosis not present

## 2018-01-06 DIAGNOSIS — K8020 Calculus of gallbladder without cholecystitis without obstruction: Secondary | ICD-10-CM | POA: Diagnosis not present

## 2018-01-06 DIAGNOSIS — D531 Other megaloblastic anemias, not elsewhere classified: Secondary | ICD-10-CM | POA: Diagnosis not present

## 2018-01-06 DIAGNOSIS — R972 Elevated prostate specific antigen [PSA]: Secondary | ICD-10-CM | POA: Diagnosis not present

## 2018-01-06 DIAGNOSIS — C9200 Acute myeloblastic leukemia, not having achieved remission: Secondary | ICD-10-CM | POA: Diagnosis not present

## 2018-01-06 DIAGNOSIS — M7989 Other specified soft tissue disorders: Secondary | ICD-10-CM | POA: Diagnosis not present

## 2018-01-06 DIAGNOSIS — Z86718 Personal history of other venous thrombosis and embolism: Secondary | ICD-10-CM | POA: Diagnosis not present

## 2018-01-06 DIAGNOSIS — Z23 Encounter for immunization: Secondary | ICD-10-CM | POA: Diagnosis not present

## 2018-01-06 DIAGNOSIS — D649 Anemia, unspecified: Secondary | ICD-10-CM | POA: Diagnosis not present

## 2018-01-06 DIAGNOSIS — R739 Hyperglycemia, unspecified: Secondary | ICD-10-CM | POA: Diagnosis not present

## 2018-01-06 DIAGNOSIS — Z96653 Presence of artificial knee joint, bilateral: Secondary | ICD-10-CM | POA: Diagnosis not present

## 2018-01-06 DIAGNOSIS — C61 Malignant neoplasm of prostate: Secondary | ICD-10-CM | POA: Diagnosis not present

## 2018-01-20 DIAGNOSIS — D531 Other megaloblastic anemias, not elsewhere classified: Secondary | ICD-10-CM | POA: Diagnosis not present

## 2018-01-20 DIAGNOSIS — D469 Myelodysplastic syndrome, unspecified: Secondary | ICD-10-CM | POA: Diagnosis not present

## 2018-01-20 DIAGNOSIS — D509 Iron deficiency anemia, unspecified: Secondary | ICD-10-CM | POA: Diagnosis not present

## 2018-01-20 DIAGNOSIS — C61 Malignant neoplasm of prostate: Secondary | ICD-10-CM | POA: Diagnosis not present

## 2018-01-20 DIAGNOSIS — D472 Monoclonal gammopathy: Secondary | ICD-10-CM | POA: Diagnosis not present

## 2018-01-26 DIAGNOSIS — K219 Gastro-esophageal reflux disease without esophagitis: Secondary | ICD-10-CM | POA: Diagnosis not present

## 2018-01-26 DIAGNOSIS — I10 Essential (primary) hypertension: Secondary | ICD-10-CM | POA: Diagnosis not present

## 2018-01-26 DIAGNOSIS — D649 Anemia, unspecified: Secondary | ICD-10-CM | POA: Diagnosis not present

## 2018-01-26 DIAGNOSIS — R5383 Other fatigue: Secondary | ICD-10-CM | POA: Diagnosis not present

## 2018-01-26 DIAGNOSIS — R739 Hyperglycemia, unspecified: Secondary | ICD-10-CM | POA: Diagnosis not present

## 2018-01-26 DIAGNOSIS — R634 Abnormal weight loss: Secondary | ICD-10-CM | POA: Diagnosis not present

## 2018-01-27 DIAGNOSIS — D472 Monoclonal gammopathy: Secondary | ICD-10-CM | POA: Diagnosis not present

## 2018-01-27 DIAGNOSIS — D469 Myelodysplastic syndrome, unspecified: Secondary | ICD-10-CM | POA: Diagnosis not present

## 2018-01-28 DIAGNOSIS — D649 Anemia, unspecified: Secondary | ICD-10-CM | POA: Diagnosis not present

## 2018-01-28 DIAGNOSIS — Z1331 Encounter for screening for depression: Secondary | ICD-10-CM | POA: Diagnosis not present

## 2018-01-28 DIAGNOSIS — E1169 Type 2 diabetes mellitus with other specified complication: Secondary | ICD-10-CM | POA: Diagnosis not present

## 2018-01-28 DIAGNOSIS — E7800 Pure hypercholesterolemia, unspecified: Secondary | ICD-10-CM | POA: Diagnosis not present

## 2018-01-28 DIAGNOSIS — Z6825 Body mass index (BMI) 25.0-25.9, adult: Secondary | ICD-10-CM | POA: Diagnosis not present

## 2018-01-28 DIAGNOSIS — Z1389 Encounter for screening for other disorder: Secondary | ICD-10-CM | POA: Diagnosis not present

## 2018-01-29 DIAGNOSIS — Z79899 Other long term (current) drug therapy: Secondary | ICD-10-CM | POA: Diagnosis not present

## 2018-01-29 DIAGNOSIS — Z91041 Radiographic dye allergy status: Secondary | ICD-10-CM | POA: Diagnosis not present

## 2018-01-29 DIAGNOSIS — D472 Monoclonal gammopathy: Secondary | ICD-10-CM | POA: Diagnosis not present

## 2018-01-29 DIAGNOSIS — M069 Rheumatoid arthritis, unspecified: Secondary | ICD-10-CM | POA: Diagnosis not present

## 2018-01-29 DIAGNOSIS — Z794 Long term (current) use of insulin: Secondary | ICD-10-CM | POA: Diagnosis not present

## 2018-01-29 DIAGNOSIS — Z8261 Family history of arthritis: Secondary | ICD-10-CM | POA: Diagnosis not present

## 2018-01-29 DIAGNOSIS — C61 Malignant neoplasm of prostate: Secondary | ICD-10-CM | POA: Diagnosis not present

## 2018-01-29 DIAGNOSIS — D469 Myelodysplastic syndrome, unspecified: Secondary | ICD-10-CM | POA: Diagnosis not present

## 2018-01-29 DIAGNOSIS — Z7952 Long term (current) use of systemic steroids: Secondary | ICD-10-CM | POA: Diagnosis not present

## 2018-01-29 DIAGNOSIS — Z8546 Personal history of malignant neoplasm of prostate: Secondary | ICD-10-CM | POA: Diagnosis not present

## 2018-01-29 DIAGNOSIS — Z8269 Family history of other diseases of the musculoskeletal system and connective tissue: Secondary | ICD-10-CM | POA: Diagnosis not present

## 2018-01-29 DIAGNOSIS — K219 Gastro-esophageal reflux disease without esophagitis: Secondary | ICD-10-CM | POA: Diagnosis not present

## 2018-01-29 DIAGNOSIS — Z96653 Presence of artificial knee joint, bilateral: Secondary | ICD-10-CM | POA: Diagnosis not present

## 2018-01-29 DIAGNOSIS — Z86718 Personal history of other venous thrombosis and embolism: Secondary | ICD-10-CM | POA: Diagnosis not present

## 2018-01-29 DIAGNOSIS — Z888 Allergy status to other drugs, medicaments and biological substances status: Secondary | ICD-10-CM | POA: Diagnosis not present

## 2018-01-30 DIAGNOSIS — I700 Atherosclerosis of aorta: Secondary | ICD-10-CM | POA: Diagnosis not present

## 2018-01-30 DIAGNOSIS — D472 Monoclonal gammopathy: Secondary | ICD-10-CM | POA: Diagnosis not present

## 2018-01-30 DIAGNOSIS — Z79899 Other long term (current) drug therapy: Secondary | ICD-10-CM | POA: Diagnosis not present

## 2018-01-30 DIAGNOSIS — M069 Rheumatoid arthritis, unspecified: Secondary | ICD-10-CM | POA: Diagnosis not present

## 2018-01-30 DIAGNOSIS — Z7952 Long term (current) use of systemic steroids: Secondary | ICD-10-CM | POA: Diagnosis not present

## 2018-01-30 DIAGNOSIS — Z794 Long term (current) use of insulin: Secondary | ICD-10-CM | POA: Diagnosis not present

## 2018-01-30 DIAGNOSIS — K219 Gastro-esophageal reflux disease without esophagitis: Secondary | ICD-10-CM | POA: Diagnosis not present

## 2018-02-03 DIAGNOSIS — Z91041 Radiographic dye allergy status: Secondary | ICD-10-CM | POA: Diagnosis not present

## 2018-02-03 DIAGNOSIS — Z794 Long term (current) use of insulin: Secondary | ICD-10-CM | POA: Diagnosis not present

## 2018-02-03 DIAGNOSIS — Z7952 Long term (current) use of systemic steroids: Secondary | ICD-10-CM | POA: Diagnosis not present

## 2018-02-03 DIAGNOSIS — M069 Rheumatoid arthritis, unspecified: Secondary | ICD-10-CM | POA: Diagnosis not present

## 2018-02-03 DIAGNOSIS — N529 Male erectile dysfunction, unspecified: Secondary | ICD-10-CM | POA: Diagnosis not present

## 2018-02-03 DIAGNOSIS — Z5111 Encounter for antineoplastic chemotherapy: Secondary | ICD-10-CM | POA: Diagnosis not present

## 2018-02-03 DIAGNOSIS — Z96653 Presence of artificial knee joint, bilateral: Secondary | ICD-10-CM | POA: Diagnosis not present

## 2018-02-03 DIAGNOSIS — Z888 Allergy status to other drugs, medicaments and biological substances status: Secondary | ICD-10-CM | POA: Diagnosis not present

## 2018-02-03 DIAGNOSIS — M1990 Unspecified osteoarthritis, unspecified site: Secondary | ICD-10-CM | POA: Diagnosis not present

## 2018-02-03 DIAGNOSIS — D469 Myelodysplastic syndrome, unspecified: Secondary | ICD-10-CM | POA: Diagnosis not present

## 2018-02-03 DIAGNOSIS — Z9079 Acquired absence of other genital organ(s): Secondary | ICD-10-CM | POA: Diagnosis not present

## 2018-02-03 DIAGNOSIS — Z8546 Personal history of malignant neoplasm of prostate: Secondary | ICD-10-CM | POA: Diagnosis not present

## 2018-02-03 DIAGNOSIS — Z981 Arthrodesis status: Secondary | ICD-10-CM | POA: Diagnosis not present

## 2018-02-03 DIAGNOSIS — D472 Monoclonal gammopathy: Secondary | ICD-10-CM | POA: Diagnosis not present

## 2018-02-03 DIAGNOSIS — Z86718 Personal history of other venous thrombosis and embolism: Secondary | ICD-10-CM | POA: Diagnosis not present

## 2018-02-03 DIAGNOSIS — Z79899 Other long term (current) drug therapy: Secondary | ICD-10-CM | POA: Diagnosis not present

## 2018-02-03 DIAGNOSIS — D539 Nutritional anemia, unspecified: Secondary | ICD-10-CM | POA: Diagnosis not present

## 2018-02-04 DIAGNOSIS — D469 Myelodysplastic syndrome, unspecified: Secondary | ICD-10-CM | POA: Diagnosis not present

## 2018-02-05 DIAGNOSIS — D469 Myelodysplastic syndrome, unspecified: Secondary | ICD-10-CM | POA: Diagnosis not present

## 2018-02-06 DIAGNOSIS — Z5111 Encounter for antineoplastic chemotherapy: Secondary | ICD-10-CM | POA: Diagnosis not present

## 2018-02-06 DIAGNOSIS — D469 Myelodysplastic syndrome, unspecified: Secondary | ICD-10-CM | POA: Diagnosis not present

## 2018-02-09 DIAGNOSIS — D469 Myelodysplastic syndrome, unspecified: Secondary | ICD-10-CM | POA: Diagnosis not present

## 2018-02-10 DIAGNOSIS — D469 Myelodysplastic syndrome, unspecified: Secondary | ICD-10-CM | POA: Diagnosis not present

## 2018-02-10 DIAGNOSIS — Z5111 Encounter for antineoplastic chemotherapy: Secondary | ICD-10-CM | POA: Diagnosis not present

## 2018-02-11 DIAGNOSIS — D469 Myelodysplastic syndrome, unspecified: Secondary | ICD-10-CM | POA: Diagnosis not present

## 2018-02-11 DIAGNOSIS — Z95828 Presence of other vascular implants and grafts: Secondary | ICD-10-CM | POA: Diagnosis not present

## 2018-02-17 DIAGNOSIS — D469 Myelodysplastic syndrome, unspecified: Secondary | ICD-10-CM | POA: Diagnosis not present

## 2018-02-17 DIAGNOSIS — C61 Malignant neoplasm of prostate: Secondary | ICD-10-CM | POA: Diagnosis not present

## 2018-02-17 DIAGNOSIS — D472 Monoclonal gammopathy: Secondary | ICD-10-CM | POA: Diagnosis not present

## 2018-02-17 DIAGNOSIS — D531 Other megaloblastic anemias, not elsewhere classified: Secondary | ICD-10-CM | POA: Diagnosis not present

## 2018-02-17 DIAGNOSIS — Z09 Encounter for follow-up examination after completed treatment for conditions other than malignant neoplasm: Secondary | ICD-10-CM | POA: Diagnosis not present

## 2018-02-17 DIAGNOSIS — Z1501 Genetic susceptibility to malignant neoplasm of breast: Secondary | ICD-10-CM | POA: Diagnosis not present

## 2018-02-17 DIAGNOSIS — Z7952 Long term (current) use of systemic steroids: Secondary | ICD-10-CM | POA: Diagnosis not present

## 2018-02-18 DIAGNOSIS — Z95828 Presence of other vascular implants and grafts: Secondary | ICD-10-CM | POA: Diagnosis not present

## 2018-02-18 DIAGNOSIS — D531 Other megaloblastic anemias, not elsewhere classified: Secondary | ICD-10-CM | POA: Diagnosis not present

## 2018-02-27 DIAGNOSIS — D469 Myelodysplastic syndrome, unspecified: Secondary | ICD-10-CM | POA: Diagnosis not present

## 2018-03-02 DIAGNOSIS — C61 Malignant neoplasm of prostate: Secondary | ICD-10-CM | POA: Diagnosis not present

## 2018-03-02 DIAGNOSIS — D472 Monoclonal gammopathy: Secondary | ICD-10-CM | POA: Diagnosis not present

## 2018-03-02 DIAGNOSIS — Z1501 Genetic susceptibility to malignant neoplasm of breast: Secondary | ICD-10-CM | POA: Diagnosis not present

## 2018-03-02 DIAGNOSIS — M1990 Unspecified osteoarthritis, unspecified site: Secondary | ICD-10-CM | POA: Diagnosis not present

## 2018-03-02 DIAGNOSIS — Z09 Encounter for follow-up examination after completed treatment for conditions other than malignant neoplasm: Secondary | ICD-10-CM | POA: Diagnosis not present

## 2018-03-02 DIAGNOSIS — D469 Myelodysplastic syndrome, unspecified: Secondary | ICD-10-CM | POA: Diagnosis not present

## 2018-03-02 DIAGNOSIS — Z5111 Encounter for antineoplastic chemotherapy: Secondary | ICD-10-CM | POA: Diagnosis not present

## 2018-03-03 DIAGNOSIS — D469 Myelodysplastic syndrome, unspecified: Secondary | ICD-10-CM | POA: Diagnosis not present

## 2018-03-04 DIAGNOSIS — E119 Type 2 diabetes mellitus without complications: Secondary | ICD-10-CM | POA: Diagnosis not present

## 2018-03-04 DIAGNOSIS — D472 Monoclonal gammopathy: Secondary | ICD-10-CM | POA: Diagnosis not present

## 2018-03-04 DIAGNOSIS — C9000 Multiple myeloma not having achieved remission: Secondary | ICD-10-CM | POA: Diagnosis not present

## 2018-03-04 DIAGNOSIS — Z5111 Encounter for antineoplastic chemotherapy: Secondary | ICD-10-CM | POA: Diagnosis not present

## 2018-03-04 DIAGNOSIS — M1990 Unspecified osteoarthritis, unspecified site: Secondary | ICD-10-CM | POA: Diagnosis not present

## 2018-03-04 DIAGNOSIS — A0472 Enterocolitis due to Clostridium difficile, not specified as recurrent: Secondary | ICD-10-CM | POA: Diagnosis not present

## 2018-03-04 DIAGNOSIS — R1110 Vomiting, unspecified: Secondary | ICD-10-CM | POA: Diagnosis not present

## 2018-03-04 DIAGNOSIS — K7689 Other specified diseases of liver: Secondary | ICD-10-CM | POA: Diagnosis not present

## 2018-03-04 DIAGNOSIS — Z86718 Personal history of other venous thrombosis and embolism: Secondary | ICD-10-CM | POA: Diagnosis not present

## 2018-03-04 DIAGNOSIS — R112 Nausea with vomiting, unspecified: Secondary | ICD-10-CM | POA: Diagnosis not present

## 2018-03-04 DIAGNOSIS — Z8546 Personal history of malignant neoplasm of prostate: Secondary | ICD-10-CM | POA: Diagnosis not present

## 2018-03-04 DIAGNOSIS — Z91041 Radiographic dye allergy status: Secondary | ICD-10-CM | POA: Diagnosis not present

## 2018-03-04 DIAGNOSIS — Z7952 Long term (current) use of systemic steroids: Secondary | ICD-10-CM | POA: Diagnosis not present

## 2018-03-04 DIAGNOSIS — Z794 Long term (current) use of insulin: Secondary | ICD-10-CM | POA: Diagnosis not present

## 2018-03-04 DIAGNOSIS — Z79899 Other long term (current) drug therapy: Secondary | ICD-10-CM | POA: Diagnosis not present

## 2018-03-05 DIAGNOSIS — E119 Type 2 diabetes mellitus without complications: Secondary | ICD-10-CM | POA: Diagnosis not present

## 2018-03-05 DIAGNOSIS — A0472 Enterocolitis due to Clostridium difficile, not specified as recurrent: Secondary | ICD-10-CM | POA: Diagnosis not present

## 2018-03-05 DIAGNOSIS — C9000 Multiple myeloma not having achieved remission: Secondary | ICD-10-CM | POA: Diagnosis not present

## 2018-03-13 DIAGNOSIS — D472 Monoclonal gammopathy: Secondary | ICD-10-CM | POA: Diagnosis not present

## 2018-03-13 DIAGNOSIS — D469 Myelodysplastic syndrome, unspecified: Secondary | ICD-10-CM | POA: Diagnosis not present

## 2018-03-13 DIAGNOSIS — D531 Other megaloblastic anemias, not elsewhere classified: Secondary | ICD-10-CM | POA: Diagnosis not present

## 2018-03-19 DIAGNOSIS — Z86718 Personal history of other venous thrombosis and embolism: Secondary | ICD-10-CM | POA: Diagnosis not present

## 2018-03-19 DIAGNOSIS — C61 Malignant neoplasm of prostate: Secondary | ICD-10-CM | POA: Diagnosis not present

## 2018-03-19 DIAGNOSIS — Z1501 Genetic susceptibility to malignant neoplasm of breast: Secondary | ICD-10-CM | POA: Diagnosis not present

## 2018-03-19 DIAGNOSIS — D649 Anemia, unspecified: Secondary | ICD-10-CM | POA: Diagnosis not present

## 2018-03-19 DIAGNOSIS — Z7952 Long term (current) use of systemic steroids: Secondary | ICD-10-CM | POA: Diagnosis not present

## 2018-03-19 DIAGNOSIS — D539 Nutritional anemia, unspecified: Secondary | ICD-10-CM | POA: Diagnosis not present

## 2018-03-19 DIAGNOSIS — D531 Other megaloblastic anemias, not elsewhere classified: Secondary | ICD-10-CM | POA: Diagnosis not present

## 2018-03-19 DIAGNOSIS — D469 Myelodysplastic syndrome, unspecified: Secondary | ICD-10-CM | POA: Diagnosis not present

## 2018-03-19 DIAGNOSIS — M1990 Unspecified osteoarthritis, unspecified site: Secondary | ICD-10-CM | POA: Diagnosis not present

## 2018-03-24 DIAGNOSIS — Z8739 Personal history of other diseases of the musculoskeletal system and connective tissue: Secondary | ICD-10-CM | POA: Diagnosis not present

## 2018-04-02 DIAGNOSIS — D531 Other megaloblastic anemias, not elsewhere classified: Secondary | ICD-10-CM | POA: Diagnosis not present

## 2018-04-02 DIAGNOSIS — D649 Anemia, unspecified: Secondary | ICD-10-CM | POA: Diagnosis not present

## 2018-04-03 DIAGNOSIS — A084 Viral intestinal infection, unspecified: Secondary | ICD-10-CM | POA: Diagnosis not present

## 2018-04-03 DIAGNOSIS — Z6824 Body mass index (BMI) 24.0-24.9, adult: Secondary | ICD-10-CM | POA: Diagnosis not present

## 2018-04-03 DIAGNOSIS — R197 Diarrhea, unspecified: Secondary | ICD-10-CM | POA: Diagnosis not present

## 2018-04-04 DIAGNOSIS — K5710 Diverticulosis of small intestine without perforation or abscess without bleeding: Secondary | ICD-10-CM | POA: Diagnosis not present

## 2018-04-15 DIAGNOSIS — M79672 Pain in left foot: Secondary | ICD-10-CM | POA: Diagnosis not present

## 2018-04-15 DIAGNOSIS — M79671 Pain in right foot: Secondary | ICD-10-CM | POA: Diagnosis not present

## 2018-04-15 DIAGNOSIS — G5790 Unspecified mononeuropathy of unspecified lower limb: Secondary | ICD-10-CM | POA: Diagnosis not present

## 2018-04-15 DIAGNOSIS — M779 Enthesopathy, unspecified: Secondary | ICD-10-CM | POA: Diagnosis not present

## 2018-04-17 DIAGNOSIS — M1990 Unspecified osteoarthritis, unspecified site: Secondary | ICD-10-CM | POA: Diagnosis not present

## 2018-04-17 DIAGNOSIS — D531 Other megaloblastic anemias, not elsewhere classified: Secondary | ICD-10-CM | POA: Diagnosis not present

## 2018-04-17 DIAGNOSIS — D469 Myelodysplastic syndrome, unspecified: Secondary | ICD-10-CM | POA: Diagnosis not present

## 2018-04-17 DIAGNOSIS — D649 Anemia, unspecified: Secondary | ICD-10-CM | POA: Diagnosis not present

## 2018-04-17 DIAGNOSIS — Z7952 Long term (current) use of systemic steroids: Secondary | ICD-10-CM | POA: Diagnosis not present

## 2018-04-20 DIAGNOSIS — D469 Myelodysplastic syndrome, unspecified: Secondary | ICD-10-CM | POA: Diagnosis not present

## 2018-04-20 DIAGNOSIS — D531 Other megaloblastic anemias, not elsewhere classified: Secondary | ICD-10-CM | POA: Diagnosis not present

## 2018-04-21 DIAGNOSIS — D531 Other megaloblastic anemias, not elsewhere classified: Secondary | ICD-10-CM | POA: Diagnosis not present

## 2018-04-23 DIAGNOSIS — I10 Essential (primary) hypertension: Secondary | ICD-10-CM | POA: Diagnosis not present

## 2018-04-23 DIAGNOSIS — R634 Abnormal weight loss: Secondary | ICD-10-CM | POA: Diagnosis not present

## 2018-04-23 DIAGNOSIS — M069 Rheumatoid arthritis, unspecified: Secondary | ICD-10-CM | POA: Diagnosis not present

## 2018-04-23 DIAGNOSIS — D649 Anemia, unspecified: Secondary | ICD-10-CM | POA: Diagnosis not present

## 2018-04-23 DIAGNOSIS — N400 Enlarged prostate without lower urinary tract symptoms: Secondary | ICD-10-CM | POA: Diagnosis not present

## 2018-04-23 DIAGNOSIS — K219 Gastro-esophageal reflux disease without esophagitis: Secondary | ICD-10-CM | POA: Diagnosis not present

## 2018-04-23 DIAGNOSIS — R739 Hyperglycemia, unspecified: Secondary | ICD-10-CM | POA: Diagnosis not present

## 2018-04-23 DIAGNOSIS — E1169 Type 2 diabetes mellitus with other specified complication: Secondary | ICD-10-CM | POA: Diagnosis not present

## 2018-04-24 DIAGNOSIS — D469 Myelodysplastic syndrome, unspecified: Secondary | ICD-10-CM | POA: Diagnosis not present

## 2018-05-05 DIAGNOSIS — M84375A Stress fracture, left foot, initial encounter for fracture: Secondary | ICD-10-CM | POA: Diagnosis not present

## 2018-05-05 DIAGNOSIS — Z6824 Body mass index (BMI) 24.0-24.9, adult: Secondary | ICD-10-CM | POA: Diagnosis not present

## 2018-05-05 DIAGNOSIS — E1169 Type 2 diabetes mellitus with other specified complication: Secondary | ICD-10-CM | POA: Diagnosis not present

## 2018-05-05 DIAGNOSIS — E1140 Type 2 diabetes mellitus with diabetic neuropathy, unspecified: Secondary | ICD-10-CM | POA: Diagnosis not present

## 2018-05-11 DIAGNOSIS — M87878 Other osteonecrosis, left toe(s): Secondary | ICD-10-CM | POA: Diagnosis not present

## 2018-05-11 DIAGNOSIS — M19072 Primary osteoarthritis, left ankle and foot: Secondary | ICD-10-CM | POA: Diagnosis not present

## 2018-05-18 DIAGNOSIS — Z7952 Long term (current) use of systemic steroids: Secondary | ICD-10-CM | POA: Diagnosis not present

## 2018-05-18 DIAGNOSIS — D469 Myelodysplastic syndrome, unspecified: Secondary | ICD-10-CM | POA: Diagnosis not present

## 2018-05-18 DIAGNOSIS — D472 Monoclonal gammopathy: Secondary | ICD-10-CM | POA: Diagnosis not present

## 2018-05-18 DIAGNOSIS — D531 Other megaloblastic anemias, not elsewhere classified: Secondary | ICD-10-CM | POA: Diagnosis not present

## 2018-05-18 DIAGNOSIS — R7989 Other specified abnormal findings of blood chemistry: Secondary | ICD-10-CM | POA: Diagnosis not present

## 2018-05-18 DIAGNOSIS — Z1501 Genetic susceptibility to malignant neoplasm of breast: Secondary | ICD-10-CM | POA: Diagnosis not present

## 2018-05-22 DIAGNOSIS — D469 Myelodysplastic syndrome, unspecified: Secondary | ICD-10-CM | POA: Diagnosis not present

## 2018-06-17 DIAGNOSIS — Z1501 Genetic susceptibility to malignant neoplasm of breast: Secondary | ICD-10-CM | POA: Diagnosis not present

## 2018-06-17 DIAGNOSIS — D469 Myelodysplastic syndrome, unspecified: Secondary | ICD-10-CM | POA: Diagnosis not present

## 2018-06-17 DIAGNOSIS — D531 Other megaloblastic anemias, not elsewhere classified: Secondary | ICD-10-CM | POA: Diagnosis not present

## 2018-06-19 DIAGNOSIS — D469 Myelodysplastic syndrome, unspecified: Secondary | ICD-10-CM | POA: Diagnosis not present

## 2018-07-17 DIAGNOSIS — D531 Other megaloblastic anemias, not elsewhere classified: Secondary | ICD-10-CM | POA: Diagnosis not present

## 2018-07-20 DIAGNOSIS — D469 Myelodysplastic syndrome, unspecified: Secondary | ICD-10-CM | POA: Diagnosis not present

## 2018-07-20 DIAGNOSIS — M1990 Unspecified osteoarthritis, unspecified site: Secondary | ICD-10-CM | POA: Diagnosis not present

## 2018-07-20 DIAGNOSIS — D531 Other megaloblastic anemias, not elsewhere classified: Secondary | ICD-10-CM | POA: Diagnosis not present

## 2018-07-21 DIAGNOSIS — M542 Cervicalgia: Secondary | ICD-10-CM | POA: Diagnosis not present

## 2018-07-21 DIAGNOSIS — D638 Anemia in other chronic diseases classified elsewhere: Secondary | ICD-10-CM | POA: Diagnosis not present

## 2018-07-21 DIAGNOSIS — M059 Rheumatoid arthritis with rheumatoid factor, unspecified: Secondary | ICD-10-CM | POA: Diagnosis not present

## 2018-07-23 DIAGNOSIS — D469 Myelodysplastic syndrome, unspecified: Secondary | ICD-10-CM | POA: Diagnosis not present

## 2018-08-15 DIAGNOSIS — E1165 Type 2 diabetes mellitus with hyperglycemia: Secondary | ICD-10-CM | POA: Diagnosis not present

## 2018-08-15 DIAGNOSIS — I10 Essential (primary) hypertension: Secondary | ICD-10-CM | POA: Diagnosis not present

## 2018-08-17 DIAGNOSIS — D469 Myelodysplastic syndrome, unspecified: Secondary | ICD-10-CM | POA: Diagnosis not present

## 2018-08-21 DIAGNOSIS — N529 Male erectile dysfunction, unspecified: Secondary | ICD-10-CM | POA: Diagnosis not present

## 2018-08-21 DIAGNOSIS — M1990 Unspecified osteoarthritis, unspecified site: Secondary | ICD-10-CM | POA: Diagnosis not present

## 2018-08-21 DIAGNOSIS — C61 Malignant neoplasm of prostate: Secondary | ICD-10-CM | POA: Diagnosis not present

## 2018-08-21 DIAGNOSIS — M069 Rheumatoid arthritis, unspecified: Secondary | ICD-10-CM | POA: Diagnosis not present

## 2018-08-21 DIAGNOSIS — D649 Anemia, unspecified: Secondary | ICD-10-CM | POA: Diagnosis not present

## 2018-08-21 DIAGNOSIS — Z86718 Personal history of other venous thrombosis and embolism: Secondary | ICD-10-CM | POA: Diagnosis not present

## 2018-08-24 DIAGNOSIS — D469 Myelodysplastic syndrome, unspecified: Secondary | ICD-10-CM | POA: Diagnosis not present

## 2018-08-24 DIAGNOSIS — Z452 Encounter for adjustment and management of vascular access device: Secondary | ICD-10-CM | POA: Diagnosis not present

## 2018-08-25 DIAGNOSIS — Z8739 Personal history of other diseases of the musculoskeletal system and connective tissue: Secondary | ICD-10-CM | POA: Diagnosis not present

## 2018-09-15 DIAGNOSIS — M069 Rheumatoid arthritis, unspecified: Secondary | ICD-10-CM | POA: Diagnosis not present

## 2018-09-15 DIAGNOSIS — I10 Essential (primary) hypertension: Secondary | ICD-10-CM | POA: Diagnosis not present

## 2018-09-15 DIAGNOSIS — E1165 Type 2 diabetes mellitus with hyperglycemia: Secondary | ICD-10-CM | POA: Diagnosis not present

## 2018-09-16 DIAGNOSIS — L570 Actinic keratosis: Secondary | ICD-10-CM | POA: Diagnosis not present

## 2018-09-21 DIAGNOSIS — R7989 Other specified abnormal findings of blood chemistry: Secondary | ICD-10-CM | POA: Diagnosis not present

## 2018-09-21 DIAGNOSIS — C61 Malignant neoplasm of prostate: Secondary | ICD-10-CM | POA: Diagnosis not present

## 2018-09-25 DIAGNOSIS — D469 Myelodysplastic syndrome, unspecified: Secondary | ICD-10-CM | POA: Diagnosis not present

## 2018-09-28 DIAGNOSIS — Z9221 Personal history of antineoplastic chemotherapy: Secondary | ICD-10-CM | POA: Diagnosis not present

## 2018-09-28 DIAGNOSIS — Z79899 Other long term (current) drug therapy: Secondary | ICD-10-CM | POA: Diagnosis not present

## 2018-09-28 DIAGNOSIS — D531 Other megaloblastic anemias, not elsewhere classified: Secondary | ICD-10-CM | POA: Diagnosis not present

## 2018-09-28 DIAGNOSIS — Z7952 Long term (current) use of systemic steroids: Secondary | ICD-10-CM | POA: Diagnosis not present

## 2018-09-28 DIAGNOSIS — Z86718 Personal history of other venous thrombosis and embolism: Secondary | ICD-10-CM | POA: Diagnosis not present

## 2018-09-28 DIAGNOSIS — M9270 Juvenile osteochondrosis of metatarsus, unspecified foot: Secondary | ICD-10-CM | POA: Diagnosis not present

## 2018-09-28 DIAGNOSIS — Z1501 Genetic susceptibility to malignant neoplasm of breast: Secondary | ICD-10-CM | POA: Diagnosis not present

## 2018-09-28 DIAGNOSIS — M1990 Unspecified osteoarthritis, unspecified site: Secondary | ICD-10-CM | POA: Diagnosis not present

## 2018-09-28 DIAGNOSIS — D469 Myelodysplastic syndrome, unspecified: Secondary | ICD-10-CM | POA: Diagnosis not present

## 2018-10-15 DIAGNOSIS — Z794 Long term (current) use of insulin: Secondary | ICD-10-CM | POA: Diagnosis not present

## 2018-10-15 DIAGNOSIS — M9270 Juvenile osteochondrosis of metatarsus, unspecified foot: Secondary | ICD-10-CM | POA: Diagnosis not present

## 2018-10-15 DIAGNOSIS — M19072 Primary osteoarthritis, left ankle and foot: Secondary | ICD-10-CM | POA: Diagnosis not present

## 2018-10-15 DIAGNOSIS — E099 Drug or chemical induced diabetes mellitus without complications: Secondary | ICD-10-CM | POA: Diagnosis not present

## 2018-10-26 DIAGNOSIS — D531 Other megaloblastic anemias, not elsewhere classified: Secondary | ICD-10-CM | POA: Diagnosis not present

## 2018-10-26 DIAGNOSIS — C61 Malignant neoplasm of prostate: Secondary | ICD-10-CM | POA: Diagnosis not present

## 2018-10-28 DIAGNOSIS — M9270 Juvenile osteochondrosis of metatarsus, unspecified foot: Secondary | ICD-10-CM | POA: Diagnosis not present

## 2018-10-28 DIAGNOSIS — Z79899 Other long term (current) drug therapy: Secondary | ICD-10-CM | POA: Diagnosis not present

## 2018-10-28 DIAGNOSIS — D469 Myelodysplastic syndrome, unspecified: Secondary | ICD-10-CM | POA: Diagnosis not present

## 2018-10-28 DIAGNOSIS — D531 Other megaloblastic anemias, not elsewhere classified: Secondary | ICD-10-CM | POA: Diagnosis not present

## 2018-10-28 DIAGNOSIS — M1990 Unspecified osteoarthritis, unspecified site: Secondary | ICD-10-CM | POA: Diagnosis not present

## 2018-10-28 DIAGNOSIS — R7989 Other specified abnormal findings of blood chemistry: Secondary | ICD-10-CM | POA: Diagnosis not present

## 2018-10-28 DIAGNOSIS — M818 Other osteoporosis without current pathological fracture: Secondary | ICD-10-CM | POA: Diagnosis not present

## 2018-10-28 DIAGNOSIS — T380X5A Adverse effect of glucocorticoids and synthetic analogues, initial encounter: Secondary | ICD-10-CM | POA: Diagnosis not present

## 2018-10-30 DIAGNOSIS — D469 Myelodysplastic syndrome, unspecified: Secondary | ICD-10-CM | POA: Diagnosis not present

## 2018-11-11 DIAGNOSIS — D649 Anemia, unspecified: Secondary | ICD-10-CM | POA: Diagnosis not present

## 2018-11-11 DIAGNOSIS — E1165 Type 2 diabetes mellitus with hyperglycemia: Secondary | ICD-10-CM | POA: Diagnosis not present

## 2018-11-11 DIAGNOSIS — R739 Hyperglycemia, unspecified: Secondary | ICD-10-CM | POA: Diagnosis not present

## 2018-11-11 DIAGNOSIS — K219 Gastro-esophageal reflux disease without esophagitis: Secondary | ICD-10-CM | POA: Diagnosis not present

## 2018-11-11 DIAGNOSIS — I10 Essential (primary) hypertension: Secondary | ICD-10-CM | POA: Diagnosis not present

## 2018-11-11 DIAGNOSIS — R5383 Other fatigue: Secondary | ICD-10-CM | POA: Diagnosis not present

## 2018-11-16 DIAGNOSIS — I10 Essential (primary) hypertension: Secondary | ICD-10-CM | POA: Diagnosis not present

## 2018-11-16 DIAGNOSIS — E1165 Type 2 diabetes mellitus with hyperglycemia: Secondary | ICD-10-CM | POA: Diagnosis not present

## 2018-11-17 IMAGING — NM NM MYOCAR MULTI W/SPECT W/WALL MOTION & EF
2 series · 12 of 12 positions shown · non-contrast
Comparison: none

[Series 1: rest · 6.51mm/px · 6 of 64 frames shown]
[frame 6/64]
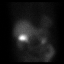
[frame 16/64]
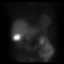
[frame 27/64]
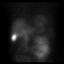
[frame 38/64]
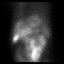
[frame 48/64]
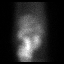
[frame 59/64]
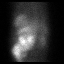

[Series 2: stress gated · 6.51mm/px · 6 of 64 frames shown]
[frame 6/64]
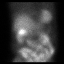
[frame 16/64]
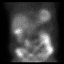
[frame 27/64]
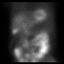
[frame 38/64]
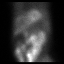
[frame 48/64]
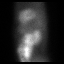
[frame 59/64]
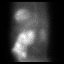

[12 of 12 positions shown; findings below may reference images not displayed]

Canned report from images found in remote index.

Refer to host system for actual result text.

## 2018-11-19 DIAGNOSIS — Z6825 Body mass index (BMI) 25.0-25.9, adult: Secondary | ICD-10-CM | POA: Diagnosis not present

## 2018-11-19 DIAGNOSIS — E1165 Type 2 diabetes mellitus with hyperglycemia: Secondary | ICD-10-CM | POA: Diagnosis not present

## 2018-11-19 DIAGNOSIS — D649 Anemia, unspecified: Secondary | ICD-10-CM | POA: Diagnosis not present

## 2018-11-19 DIAGNOSIS — Z23 Encounter for immunization: Secondary | ICD-10-CM | POA: Diagnosis not present

## 2018-11-24 DIAGNOSIS — C61 Malignant neoplasm of prostate: Secondary | ICD-10-CM | POA: Diagnosis not present

## 2018-11-24 DIAGNOSIS — D531 Other megaloblastic anemias, not elsewhere classified: Secondary | ICD-10-CM | POA: Diagnosis not present

## 2018-11-24 DIAGNOSIS — D469 Myelodysplastic syndrome, unspecified: Secondary | ICD-10-CM | POA: Diagnosis not present

## 2018-11-26 DIAGNOSIS — Z7952 Long term (current) use of systemic steroids: Secondary | ICD-10-CM | POA: Diagnosis not present

## 2018-11-26 DIAGNOSIS — Z1501 Genetic susceptibility to malignant neoplasm of breast: Secondary | ICD-10-CM | POA: Diagnosis not present

## 2018-11-26 DIAGNOSIS — Z9221 Personal history of antineoplastic chemotherapy: Secondary | ICD-10-CM | POA: Diagnosis not present

## 2018-11-26 DIAGNOSIS — M1990 Unspecified osteoarthritis, unspecified site: Secondary | ICD-10-CM | POA: Diagnosis not present

## 2018-11-26 DIAGNOSIS — D469 Myelodysplastic syndrome, unspecified: Secondary | ICD-10-CM | POA: Diagnosis not present

## 2018-11-26 DIAGNOSIS — Z79899 Other long term (current) drug therapy: Secondary | ICD-10-CM | POA: Diagnosis not present

## 2018-11-26 DIAGNOSIS — D531 Other megaloblastic anemias, not elsewhere classified: Secondary | ICD-10-CM | POA: Diagnosis not present

## 2018-11-26 DIAGNOSIS — M9270 Juvenile osteochondrosis of metatarsus, unspecified foot: Secondary | ICD-10-CM | POA: Diagnosis not present

## 2018-11-26 DIAGNOSIS — T380X5A Adverse effect of glucocorticoids and synthetic analogues, initial encounter: Secondary | ICD-10-CM | POA: Diagnosis not present

## 2018-11-26 DIAGNOSIS — M818 Other osteoporosis without current pathological fracture: Secondary | ICD-10-CM | POA: Diagnosis not present

## 2018-11-27 DIAGNOSIS — D469 Myelodysplastic syndrome, unspecified: Secondary | ICD-10-CM | POA: Diagnosis not present

## 2018-12-16 DIAGNOSIS — Z452 Encounter for adjustment and management of vascular access device: Secondary | ICD-10-CM | POA: Diagnosis not present

## 2018-12-17 DIAGNOSIS — M9272 Juvenile osteochondrosis of metatarsus, left foot: Secondary | ICD-10-CM | POA: Diagnosis not present

## 2018-12-17 DIAGNOSIS — M85872 Other specified disorders of bone density and structure, left ankle and foot: Secondary | ICD-10-CM | POA: Diagnosis not present

## 2018-12-17 DIAGNOSIS — M9270 Juvenile osteochondrosis of metatarsus, unspecified foot: Secondary | ICD-10-CM | POA: Diagnosis not present

## 2018-12-17 DIAGNOSIS — E099 Drug or chemical induced diabetes mellitus without complications: Secondary | ICD-10-CM | POA: Diagnosis not present

## 2018-12-17 DIAGNOSIS — Z794 Long term (current) use of insulin: Secondary | ICD-10-CM | POA: Diagnosis not present

## 2018-12-22 DIAGNOSIS — C61 Malignant neoplasm of prostate: Secondary | ICD-10-CM | POA: Diagnosis not present

## 2018-12-22 DIAGNOSIS — E119 Type 2 diabetes mellitus without complications: Secondary | ICD-10-CM | POA: Diagnosis not present

## 2018-12-22 DIAGNOSIS — D649 Anemia, unspecified: Secondary | ICD-10-CM | POA: Diagnosis not present

## 2018-12-22 DIAGNOSIS — M9272 Juvenile osteochondrosis of metatarsus, left foot: Secondary | ICD-10-CM | POA: Diagnosis not present

## 2018-12-22 DIAGNOSIS — Z8739 Personal history of other diseases of the musculoskeletal system and connective tissue: Secondary | ICD-10-CM | POA: Diagnosis not present

## 2018-12-22 DIAGNOSIS — Z794 Long term (current) use of insulin: Secondary | ICD-10-CM | POA: Diagnosis not present

## 2018-12-24 DIAGNOSIS — Z0389 Encounter for observation for other suspected diseases and conditions ruled out: Secondary | ICD-10-CM | POA: Diagnosis not present

## 2018-12-24 DIAGNOSIS — D469 Myelodysplastic syndrome, unspecified: Secondary | ICD-10-CM | POA: Diagnosis not present

## 2018-12-24 DIAGNOSIS — E099 Drug or chemical induced diabetes mellitus without complications: Secondary | ICD-10-CM | POA: Diagnosis not present

## 2018-12-24 DIAGNOSIS — C61 Malignant neoplasm of prostate: Secondary | ICD-10-CM | POA: Diagnosis not present

## 2018-12-24 DIAGNOSIS — D649 Anemia, unspecified: Secondary | ICD-10-CM | POA: Diagnosis not present

## 2018-12-24 DIAGNOSIS — Z794 Long term (current) use of insulin: Secondary | ICD-10-CM | POA: Diagnosis not present

## 2018-12-24 DIAGNOSIS — M9272 Juvenile osteochondrosis of metatarsus, left foot: Secondary | ICD-10-CM | POA: Diagnosis not present

## 2018-12-25 DIAGNOSIS — M1990 Unspecified osteoarthritis, unspecified site: Secondary | ICD-10-CM | POA: Diagnosis not present

## 2018-12-25 DIAGNOSIS — Z1501 Genetic susceptibility to malignant neoplasm of breast: Secondary | ICD-10-CM | POA: Diagnosis not present

## 2018-12-25 DIAGNOSIS — D469 Myelodysplastic syndrome, unspecified: Secondary | ICD-10-CM | POA: Diagnosis not present

## 2018-12-25 DIAGNOSIS — M9272 Juvenile osteochondrosis of metatarsus, left foot: Secondary | ICD-10-CM | POA: Diagnosis not present

## 2018-12-25 DIAGNOSIS — D472 Monoclonal gammopathy: Secondary | ICD-10-CM | POA: Diagnosis not present

## 2018-12-29 DIAGNOSIS — M9272 Juvenile osteochondrosis of metatarsus, left foot: Secondary | ICD-10-CM | POA: Diagnosis not present

## 2018-12-29 DIAGNOSIS — Z86718 Personal history of other venous thrombosis and embolism: Secondary | ICD-10-CM | POA: Diagnosis not present

## 2018-12-29 DIAGNOSIS — Z96653 Presence of artificial knee joint, bilateral: Secondary | ICD-10-CM | POA: Diagnosis not present

## 2018-12-29 DIAGNOSIS — Z794 Long term (current) use of insulin: Secondary | ICD-10-CM | POA: Diagnosis not present

## 2018-12-29 DIAGNOSIS — E119 Type 2 diabetes mellitus without complications: Secondary | ICD-10-CM | POA: Diagnosis not present

## 2018-12-29 DIAGNOSIS — Z91041 Radiographic dye allergy status: Secondary | ICD-10-CM | POA: Diagnosis not present

## 2019-01-05 DIAGNOSIS — Z91041 Radiographic dye allergy status: Secondary | ICD-10-CM | POA: Diagnosis not present

## 2019-01-05 DIAGNOSIS — Z7982 Long term (current) use of aspirin: Secondary | ICD-10-CM | POA: Diagnosis not present

## 2019-01-05 DIAGNOSIS — D469 Myelodysplastic syndrome, unspecified: Secondary | ICD-10-CM | POA: Diagnosis not present

## 2019-01-05 DIAGNOSIS — M064 Inflammatory polyarthropathy: Secondary | ICD-10-CM | POA: Diagnosis not present

## 2019-01-05 DIAGNOSIS — M119 Crystal arthropathy, unspecified: Secondary | ICD-10-CM | POA: Diagnosis not present

## 2019-01-05 DIAGNOSIS — Z7952 Long term (current) use of systemic steroids: Secondary | ICD-10-CM | POA: Diagnosis not present

## 2019-01-05 DIAGNOSIS — Z79899 Other long term (current) drug therapy: Secondary | ICD-10-CM | POA: Diagnosis not present

## 2019-01-11 DIAGNOSIS — Z7952 Long term (current) use of systemic steroids: Secondary | ICD-10-CM | POA: Diagnosis not present

## 2019-01-11 DIAGNOSIS — Z13820 Encounter for screening for osteoporosis: Secondary | ICD-10-CM | POA: Diagnosis not present

## 2019-01-21 DIAGNOSIS — D469 Myelodysplastic syndrome, unspecified: Secondary | ICD-10-CM | POA: Diagnosis not present

## 2019-01-21 DIAGNOSIS — C61 Malignant neoplasm of prostate: Secondary | ICD-10-CM | POA: Diagnosis not present

## 2019-01-21 DIAGNOSIS — D649 Anemia, unspecified: Secondary | ICD-10-CM | POA: Diagnosis not present

## 2019-01-22 DIAGNOSIS — D469 Myelodysplastic syndrome, unspecified: Secondary | ICD-10-CM | POA: Diagnosis not present

## 2019-01-25 DIAGNOSIS — M1990 Unspecified osteoarthritis, unspecified site: Secondary | ICD-10-CM | POA: Diagnosis not present

## 2019-01-25 DIAGNOSIS — M9272 Juvenile osteochondrosis of metatarsus, left foot: Secondary | ICD-10-CM | POA: Diagnosis not present

## 2019-01-25 DIAGNOSIS — R7989 Other specified abnormal findings of blood chemistry: Secondary | ICD-10-CM | POA: Diagnosis not present

## 2019-01-25 DIAGNOSIS — D472 Monoclonal gammopathy: Secondary | ICD-10-CM | POA: Diagnosis not present

## 2019-01-25 DIAGNOSIS — D469 Myelodysplastic syndrome, unspecified: Secondary | ICD-10-CM | POA: Diagnosis not present

## 2019-01-25 DIAGNOSIS — Z1501 Genetic susceptibility to malignant neoplasm of breast: Secondary | ICD-10-CM | POA: Diagnosis not present

## 2019-01-25 DIAGNOSIS — Z7952 Long term (current) use of systemic steroids: Secondary | ICD-10-CM | POA: Diagnosis not present

## 2019-02-04 DIAGNOSIS — M19072 Primary osteoarthritis, left ankle and foot: Secondary | ICD-10-CM | POA: Diagnosis not present

## 2019-02-10 DIAGNOSIS — E559 Vitamin D deficiency, unspecified: Secondary | ICD-10-CM | POA: Diagnosis not present

## 2019-02-10 DIAGNOSIS — M818 Other osteoporosis without current pathological fracture: Secondary | ICD-10-CM | POA: Diagnosis not present

## 2019-02-10 DIAGNOSIS — Z95828 Presence of other vascular implants and grafts: Secondary | ICD-10-CM | POA: Diagnosis not present

## 2019-02-10 DIAGNOSIS — T380X5A Adverse effect of glucocorticoids and synthetic analogues, initial encounter: Secondary | ICD-10-CM | POA: Diagnosis not present

## 2019-02-22 DIAGNOSIS — Z03818 Encounter for observation for suspected exposure to other biological agents ruled out: Secondary | ICD-10-CM | POA: Diagnosis not present

## 2019-02-22 DIAGNOSIS — Z20828 Contact with and (suspected) exposure to other viral communicable diseases: Secondary | ICD-10-CM | POA: Diagnosis not present

## 2019-02-24 DIAGNOSIS — C61 Malignant neoplasm of prostate: Secondary | ICD-10-CM | POA: Diagnosis not present

## 2019-02-24 DIAGNOSIS — D649 Anemia, unspecified: Secondary | ICD-10-CM | POA: Diagnosis not present

## 2019-03-25 DIAGNOSIS — C61 Malignant neoplasm of prostate: Secondary | ICD-10-CM | POA: Diagnosis not present

## 2019-03-25 DIAGNOSIS — D649 Anemia, unspecified: Secondary | ICD-10-CM | POA: Diagnosis not present

## 2019-03-31 DIAGNOSIS — C61 Malignant neoplasm of prostate: Secondary | ICD-10-CM | POA: Diagnosis not present

## 2019-03-31 DIAGNOSIS — Z2883 Immunization not carried out due to unavailability of vaccine: Secondary | ICD-10-CM | POA: Diagnosis not present

## 2019-03-31 DIAGNOSIS — M818 Other osteoporosis without current pathological fracture: Secondary | ICD-10-CM | POA: Diagnosis not present

## 2019-03-31 DIAGNOSIS — Z7952 Long term (current) use of systemic steroids: Secondary | ICD-10-CM | POA: Diagnosis not present

## 2019-03-31 DIAGNOSIS — Z1501 Genetic susceptibility to malignant neoplasm of breast: Secondary | ICD-10-CM | POA: Diagnosis not present

## 2019-03-31 DIAGNOSIS — R7989 Other specified abnormal findings of blood chemistry: Secondary | ICD-10-CM | POA: Diagnosis not present

## 2019-03-31 DIAGNOSIS — D469 Myelodysplastic syndrome, unspecified: Secondary | ICD-10-CM | POA: Diagnosis not present

## 2019-03-31 DIAGNOSIS — T380X5A Adverse effect of glucocorticoids and synthetic analogues, initial encounter: Secondary | ICD-10-CM | POA: Diagnosis not present

## 2019-03-31 DIAGNOSIS — M1120 Other chondrocalcinosis, unspecified site: Secondary | ICD-10-CM | POA: Diagnosis not present

## 2019-04-10 ENCOUNTER — Ambulatory Visit: Payer: Medicare Other | Attending: Internal Medicine

## 2019-04-10 DIAGNOSIS — Z23 Encounter for immunization: Secondary | ICD-10-CM | POA: Insufficient documentation

## 2019-04-10 NOTE — Progress Notes (Signed)
   Covid-19 Vaccination Clinic  Name:  David Joseph    MRN: [id number] DOB: 14-Jun-1940  04/10/2019  [name] was observed post Covid-19 immunization for 15 minutes without incidence. He was provided with Vaccine Information Sheet and instruction to access the V-Safe system.   [name] was instructed to call 911 with any severe reactions post vaccine: Marland Kitchen Difficulty breathing  . Swelling of your face and throat  . A fast heartbeat  . A bad rash all over your body  . Dizziness and weakness    Immunizations Administered    Name Date Dose VIS Date Route   Pfizer COVID-19 Vaccine 04/10/2019  1:46 PM 0.3 mL 02/26/2019 Intramuscular   Manufacturer: [place]   Lot: [id number]   [place]: [address]-3

## 2019-04-13 DIAGNOSIS — Z8739 Personal history of other diseases of the musculoskeletal system and connective tissue: Secondary | ICD-10-CM | POA: Diagnosis not present

## 2019-04-16 ENCOUNTER — Ambulatory Visit: Payer: Medicare Other

## 2019-04-16 DIAGNOSIS — I10 Essential (primary) hypertension: Secondary | ICD-10-CM | POA: Diagnosis not present

## 2019-04-16 DIAGNOSIS — E1165 Type 2 diabetes mellitus with hyperglycemia: Secondary | ICD-10-CM | POA: Diagnosis not present

## 2019-04-21 ENCOUNTER — Ambulatory Visit: Payer: Medicare Other

## 2019-05-01 ENCOUNTER — Ambulatory Visit: Payer: Medicare Other | Attending: Internal Medicine

## 2019-05-01 DIAGNOSIS — Z23 Encounter for immunization: Secondary | ICD-10-CM | POA: Insufficient documentation

## 2019-05-01 NOTE — Progress Notes (Signed)
   Covid-19 Vaccination Clinic  Name:  David Joseph    MRN: [id number] DOB: 21-Apr-1940  05/01/2019  [name] was observed post Covid-19 immunization for 15 minutes without incidence. He was provided with Vaccine Information Sheet and instruction to access the V-Safe system.   [name] was instructed to call 911 with any severe reactions post vaccine: Marland Kitchen Difficulty breathing  . Swelling of your face and throat  . A fast heartbeat  . A bad rash all over your body  . Dizziness and weakness    Immunizations Administered    Name Date Dose VIS Date Route   Pfizer COVID-19 Vaccine 05/01/2019 12:48 PM 0.3 mL 02/26/2019 Intramuscular   Manufacturer: [place]   Lot: [id number]   [place]: [address]-2

## 2019-05-04 DIAGNOSIS — C61 Malignant neoplasm of prostate: Secondary | ICD-10-CM | POA: Diagnosis not present

## 2019-05-04 DIAGNOSIS — D469 Myelodysplastic syndrome, unspecified: Secondary | ICD-10-CM | POA: Diagnosis not present

## 2019-05-04 DIAGNOSIS — D649 Anemia, unspecified: Secondary | ICD-10-CM | POA: Diagnosis not present

## 2019-05-05 DIAGNOSIS — D469 Myelodysplastic syndrome, unspecified: Secondary | ICD-10-CM | POA: Diagnosis not present

## 2019-05-07 DIAGNOSIS — D472 Monoclonal gammopathy: Secondary | ICD-10-CM | POA: Diagnosis not present

## 2019-05-07 DIAGNOSIS — Z7952 Long term (current) use of systemic steroids: Secondary | ICD-10-CM | POA: Diagnosis not present

## 2019-05-07 DIAGNOSIS — D469 Myelodysplastic syndrome, unspecified: Secondary | ICD-10-CM | POA: Diagnosis not present

## 2019-05-07 DIAGNOSIS — M1120 Other chondrocalcinosis, unspecified site: Secondary | ICD-10-CM | POA: Diagnosis not present

## 2019-05-07 DIAGNOSIS — M1990 Unspecified osteoarthritis, unspecified site: Secondary | ICD-10-CM | POA: Diagnosis not present

## 2019-05-07 DIAGNOSIS — D649 Anemia, unspecified: Secondary | ICD-10-CM | POA: Diagnosis not present

## 2019-05-14 DIAGNOSIS — E1169 Type 2 diabetes mellitus with other specified complication: Secondary | ICD-10-CM | POA: Diagnosis not present

## 2019-05-14 DIAGNOSIS — E7849 Other hyperlipidemia: Secondary | ICD-10-CM | POA: Diagnosis not present

## 2019-05-14 DIAGNOSIS — E538 Deficiency of other specified B group vitamins: Secondary | ICD-10-CM | POA: Diagnosis not present

## 2019-05-14 DIAGNOSIS — K219 Gastro-esophageal reflux disease without esophagitis: Secondary | ICD-10-CM | POA: Diagnosis not present

## 2019-05-14 DIAGNOSIS — I10 Essential (primary) hypertension: Secondary | ICD-10-CM | POA: Diagnosis not present

## 2019-05-14 DIAGNOSIS — R946 Abnormal results of thyroid function studies: Secondary | ICD-10-CM | POA: Diagnosis not present

## 2019-05-19 DIAGNOSIS — D649 Anemia, unspecified: Secondary | ICD-10-CM | POA: Diagnosis not present

## 2019-05-19 DIAGNOSIS — Z6825 Body mass index (BMI) 25.0-25.9, adult: Secondary | ICD-10-CM | POA: Diagnosis not present

## 2019-05-19 DIAGNOSIS — Z0000 Encounter for general adult medical examination without abnormal findings: Secondary | ICD-10-CM | POA: Diagnosis not present

## 2019-05-19 DIAGNOSIS — E1165 Type 2 diabetes mellitus with hyperglycemia: Secondary | ICD-10-CM | POA: Diagnosis not present

## 2019-05-28 DIAGNOSIS — D469 Myelodysplastic syndrome, unspecified: Secondary | ICD-10-CM | POA: Diagnosis not present

## 2019-05-28 DIAGNOSIS — Z1501 Genetic susceptibility to malignant neoplasm of breast: Secondary | ICD-10-CM | POA: Diagnosis not present

## 2019-05-28 DIAGNOSIS — D649 Anemia, unspecified: Secondary | ICD-10-CM | POA: Diagnosis not present

## 2019-06-03 DIAGNOSIS — T380X5A Adverse effect of glucocorticoids and synthetic analogues, initial encounter: Secondary | ICD-10-CM | POA: Diagnosis not present

## 2019-06-03 DIAGNOSIS — M1990 Unspecified osteoarthritis, unspecified site: Secondary | ICD-10-CM | POA: Diagnosis not present

## 2019-06-03 DIAGNOSIS — M1120 Other chondrocalcinosis, unspecified site: Secondary | ICD-10-CM | POA: Diagnosis not present

## 2019-06-03 DIAGNOSIS — M818 Other osteoporosis without current pathological fracture: Secondary | ICD-10-CM | POA: Diagnosis not present

## 2019-06-03 DIAGNOSIS — D469 Myelodysplastic syndrome, unspecified: Secondary | ICD-10-CM | POA: Diagnosis not present

## 2019-06-03 DIAGNOSIS — D472 Monoclonal gammopathy: Secondary | ICD-10-CM | POA: Diagnosis not present

## 2019-06-08 DIAGNOSIS — M1180 Other specified crystal arthropathies, unspecified site: Secondary | ICD-10-CM | POA: Diagnosis not present

## 2019-06-10 DIAGNOSIS — Z961 Presence of intraocular lens: Secondary | ICD-10-CM | POA: Diagnosis not present

## 2019-06-10 DIAGNOSIS — H43811 Vitreous degeneration, right eye: Secondary | ICD-10-CM | POA: Diagnosis not present

## 2019-06-10 DIAGNOSIS — M1180 Other specified crystal arthropathies, unspecified site: Secondary | ICD-10-CM | POA: Diagnosis not present

## 2019-06-10 DIAGNOSIS — E119 Type 2 diabetes mellitus without complications: Secondary | ICD-10-CM | POA: Diagnosis not present

## 2019-06-10 DIAGNOSIS — Z79899 Other long term (current) drug therapy: Secondary | ICD-10-CM | POA: Diagnosis not present

## 2019-06-16 DIAGNOSIS — I10 Essential (primary) hypertension: Secondary | ICD-10-CM | POA: Diagnosis not present

## 2019-06-16 DIAGNOSIS — E7849 Other hyperlipidemia: Secondary | ICD-10-CM | POA: Diagnosis not present

## 2019-07-01 DIAGNOSIS — D469 Myelodysplastic syndrome, unspecified: Secondary | ICD-10-CM | POA: Diagnosis not present

## 2019-07-01 DIAGNOSIS — R7989 Other specified abnormal findings of blood chemistry: Secondary | ICD-10-CM | POA: Diagnosis not present

## 2019-07-01 DIAGNOSIS — C61 Malignant neoplasm of prostate: Secondary | ICD-10-CM | POA: Diagnosis not present

## 2019-07-05 DIAGNOSIS — D472 Monoclonal gammopathy: Secondary | ICD-10-CM | POA: Diagnosis not present

## 2019-07-05 DIAGNOSIS — D649 Anemia, unspecified: Secondary | ICD-10-CM | POA: Diagnosis not present

## 2019-07-05 DIAGNOSIS — T380X5A Adverse effect of glucocorticoids and synthetic analogues, initial encounter: Secondary | ICD-10-CM | POA: Diagnosis not present

## 2019-07-05 DIAGNOSIS — M818 Other osteoporosis without current pathological fracture: Secondary | ICD-10-CM | POA: Diagnosis not present

## 2019-07-05 DIAGNOSIS — M1990 Unspecified osteoarthritis, unspecified site: Secondary | ICD-10-CM | POA: Diagnosis not present

## 2019-07-05 DIAGNOSIS — D469 Myelodysplastic syndrome, unspecified: Secondary | ICD-10-CM | POA: Diagnosis not present

## 2019-07-09 DIAGNOSIS — D469 Myelodysplastic syndrome, unspecified: Secondary | ICD-10-CM | POA: Diagnosis not present

## 2019-07-14 DIAGNOSIS — D485 Neoplasm of uncertain behavior of skin: Secondary | ICD-10-CM | POA: Diagnosis not present

## 2019-07-14 DIAGNOSIS — L570 Actinic keratosis: Secondary | ICD-10-CM | POA: Diagnosis not present

## 2019-07-14 DIAGNOSIS — D0422 Carcinoma in situ of skin of left ear and external auricular canal: Secondary | ICD-10-CM | POA: Diagnosis not present

## 2019-07-29 DIAGNOSIS — C44229 Squamous cell carcinoma of skin of left ear and external auricular canal: Secondary | ICD-10-CM | POA: Diagnosis not present

## 2019-08-02 DIAGNOSIS — M1120 Other chondrocalcinosis, unspecified site: Secondary | ICD-10-CM | POA: Diagnosis not present

## 2019-08-02 DIAGNOSIS — C61 Malignant neoplasm of prostate: Secondary | ICD-10-CM | POA: Diagnosis not present

## 2019-08-02 DIAGNOSIS — R7989 Other specified abnormal findings of blood chemistry: Secondary | ICD-10-CM | POA: Diagnosis not present

## 2019-08-04 DIAGNOSIS — K8020 Calculus of gallbladder without cholecystitis without obstruction: Secondary | ICD-10-CM | POA: Diagnosis not present

## 2019-08-04 DIAGNOSIS — R7982 Elevated C-reactive protein (CRP): Secondary | ICD-10-CM | POA: Diagnosis not present

## 2019-08-04 DIAGNOSIS — R9721 Rising PSA following treatment for malignant neoplasm of prostate: Secondary | ICD-10-CM | POA: Diagnosis not present

## 2019-08-04 DIAGNOSIS — M069 Rheumatoid arthritis, unspecified: Secondary | ICD-10-CM | POA: Diagnosis not present

## 2019-08-04 DIAGNOSIS — M79642 Pain in left hand: Secondary | ICD-10-CM | POA: Diagnosis not present

## 2019-08-04 DIAGNOSIS — N529 Male erectile dysfunction, unspecified: Secondary | ICD-10-CM | POA: Diagnosis not present

## 2019-08-04 DIAGNOSIS — D472 Monoclonal gammopathy: Secondary | ICD-10-CM | POA: Diagnosis not present

## 2019-08-04 DIAGNOSIS — Z91041 Radiographic dye allergy status: Secondary | ICD-10-CM | POA: Diagnosis not present

## 2019-08-04 DIAGNOSIS — D469 Myelodysplastic syndrome, unspecified: Secondary | ICD-10-CM | POA: Diagnosis not present

## 2019-08-04 DIAGNOSIS — C9000 Multiple myeloma not having achieved remission: Secondary | ICD-10-CM | POA: Diagnosis not present

## 2019-08-04 DIAGNOSIS — T383X1A Poisoning by insulin and oral hypoglycemic [antidiabetic] drugs, accidental (unintentional), initial encounter: Secondary | ICD-10-CM | POA: Diagnosis not present

## 2019-08-04 DIAGNOSIS — Z96653 Presence of artificial knee joint, bilateral: Secondary | ICD-10-CM | POA: Diagnosis not present

## 2019-08-04 DIAGNOSIS — M25531 Pain in right wrist: Secondary | ICD-10-CM | POA: Diagnosis not present

## 2019-08-04 DIAGNOSIS — M1990 Unspecified osteoarthritis, unspecified site: Secondary | ICD-10-CM | POA: Diagnosis not present

## 2019-08-04 DIAGNOSIS — C61 Malignant neoplasm of prostate: Secondary | ICD-10-CM | POA: Diagnosis not present

## 2019-08-04 DIAGNOSIS — E1165 Type 2 diabetes mellitus with hyperglycemia: Secondary | ICD-10-CM | POA: Diagnosis not present

## 2019-08-04 DIAGNOSIS — E213 Hyperparathyroidism, unspecified: Secondary | ICD-10-CM | POA: Diagnosis not present

## 2019-08-04 DIAGNOSIS — C50929 Malignant neoplasm of unspecified site of unspecified male breast: Secondary | ICD-10-CM | POA: Diagnosis not present

## 2019-08-04 DIAGNOSIS — M79672 Pain in left foot: Secondary | ICD-10-CM | POA: Diagnosis not present

## 2019-08-04 DIAGNOSIS — D6859 Other primary thrombophilia: Secondary | ICD-10-CM | POA: Diagnosis not present

## 2019-08-04 DIAGNOSIS — R0600 Dyspnea, unspecified: Secondary | ICD-10-CM | POA: Diagnosis not present

## 2019-08-04 DIAGNOSIS — M79641 Pain in right hand: Secondary | ICD-10-CM | POA: Diagnosis not present

## 2019-08-04 DIAGNOSIS — Z86718 Personal history of other venous thrombosis and embolism: Secondary | ICD-10-CM | POA: Diagnosis not present

## 2019-08-04 DIAGNOSIS — D539 Nutritional anemia, unspecified: Secondary | ICD-10-CM | POA: Diagnosis not present

## 2019-08-04 DIAGNOSIS — Z791 Long term (current) use of non-steroidal anti-inflammatories (NSAID): Secondary | ICD-10-CM | POA: Diagnosis not present

## 2019-08-31 DIAGNOSIS — M11841 Other specified crystal arthropathies, right hand: Secondary | ICD-10-CM | POA: Diagnosis not present

## 2019-08-31 DIAGNOSIS — M129 Arthropathy, unspecified: Secondary | ICD-10-CM | POA: Diagnosis not present

## 2019-08-31 DIAGNOSIS — M7989 Other specified soft tissue disorders: Secondary | ICD-10-CM | POA: Diagnosis not present

## 2019-08-31 DIAGNOSIS — Z96653 Presence of artificial knee joint, bilateral: Secondary | ICD-10-CM | POA: Diagnosis not present

## 2019-08-31 DIAGNOSIS — M11842 Other specified crystal arthropathies, left hand: Secondary | ICD-10-CM | POA: Diagnosis not present

## 2019-08-31 DIAGNOSIS — Z8739 Personal history of other diseases of the musculoskeletal system and connective tissue: Secondary | ICD-10-CM | POA: Diagnosis not present

## 2019-09-01 DIAGNOSIS — Z95828 Presence of other vascular implants and grafts: Secondary | ICD-10-CM | POA: Diagnosis not present

## 2019-09-01 DIAGNOSIS — Z452 Encounter for adjustment and management of vascular access device: Secondary | ICD-10-CM | POA: Diagnosis not present

## 2019-09-03 DIAGNOSIS — D649 Anemia, unspecified: Secondary | ICD-10-CM | POA: Diagnosis not present

## 2019-09-27 DIAGNOSIS — K2970 Gastritis, unspecified, without bleeding: Secondary | ICD-10-CM | POA: Diagnosis not present

## 2019-09-27 DIAGNOSIS — K317 Polyp of stomach and duodenum: Secondary | ICD-10-CM | POA: Diagnosis not present

## 2019-09-27 DIAGNOSIS — K633 Ulcer of intestine: Secondary | ICD-10-CM | POA: Diagnosis not present

## 2019-09-27 DIAGNOSIS — Z96653 Presence of artificial knee joint, bilateral: Secondary | ICD-10-CM | POA: Diagnosis not present

## 2019-09-27 DIAGNOSIS — E119 Type 2 diabetes mellitus without complications: Secondary | ICD-10-CM | POA: Diagnosis not present

## 2019-09-27 DIAGNOSIS — K2980 Duodenitis without bleeding: Secondary | ICD-10-CM | POA: Diagnosis not present

## 2019-09-27 DIAGNOSIS — Z91041 Radiographic dye allergy status: Secondary | ICD-10-CM | POA: Diagnosis not present

## 2019-09-27 DIAGNOSIS — K5730 Diverticulosis of large intestine without perforation or abscess without bleeding: Secondary | ICD-10-CM | POA: Diagnosis not present

## 2019-09-27 DIAGNOSIS — D509 Iron deficiency anemia, unspecified: Secondary | ICD-10-CM | POA: Diagnosis not present

## 2019-09-27 DIAGNOSIS — Z86718 Personal history of other venous thrombosis and embolism: Secondary | ICD-10-CM | POA: Diagnosis not present

## 2019-10-06 DIAGNOSIS — Z6825 Body mass index (BMI) 25.0-25.9, adult: Secondary | ICD-10-CM | POA: Diagnosis not present

## 2019-10-06 DIAGNOSIS — E1165 Type 2 diabetes mellitus with hyperglycemia: Secondary | ICD-10-CM | POA: Diagnosis not present

## 2019-10-11 DIAGNOSIS — C61 Malignant neoplasm of prostate: Secondary | ICD-10-CM | POA: Diagnosis not present

## 2019-10-11 DIAGNOSIS — D649 Anemia, unspecified: Secondary | ICD-10-CM | POA: Diagnosis not present

## 2019-10-11 DIAGNOSIS — D469 Myelodysplastic syndrome, unspecified: Secondary | ICD-10-CM | POA: Diagnosis not present

## 2019-10-12 DIAGNOSIS — D500 Iron deficiency anemia secondary to blood loss (chronic): Secondary | ICD-10-CM | POA: Diagnosis not present

## 2019-10-12 DIAGNOSIS — K633 Ulcer of intestine: Secondary | ICD-10-CM | POA: Diagnosis not present

## 2019-10-13 DIAGNOSIS — K2901 Acute gastritis with bleeding: Secondary | ICD-10-CM | POA: Diagnosis not present

## 2019-10-13 DIAGNOSIS — D472 Monoclonal gammopathy: Secondary | ICD-10-CM | POA: Diagnosis not present

## 2019-10-13 DIAGNOSIS — M1120 Other chondrocalcinosis, unspecified site: Secondary | ICD-10-CM | POA: Diagnosis not present

## 2019-10-13 DIAGNOSIS — Z7189 Other specified counseling: Secondary | ICD-10-CM | POA: Diagnosis not present

## 2019-10-13 DIAGNOSIS — D469 Myelodysplastic syndrome, unspecified: Secondary | ICD-10-CM | POA: Diagnosis not present

## 2019-10-13 DIAGNOSIS — Z791 Long term (current) use of non-steroidal anti-inflammatories (NSAID): Secondary | ICD-10-CM | POA: Diagnosis not present

## 2019-10-13 DIAGNOSIS — D500 Iron deficiency anemia secondary to blood loss (chronic): Secondary | ICD-10-CM | POA: Diagnosis not present

## 2019-10-14 DIAGNOSIS — D469 Myelodysplastic syndrome, unspecified: Secondary | ICD-10-CM | POA: Diagnosis not present

## 2019-10-19 DIAGNOSIS — E1165 Type 2 diabetes mellitus with hyperglycemia: Secondary | ICD-10-CM | POA: Diagnosis not present

## 2019-10-19 DIAGNOSIS — M069 Rheumatoid arthritis, unspecified: Secondary | ICD-10-CM | POA: Diagnosis not present

## 2019-10-19 DIAGNOSIS — Z6825 Body mass index (BMI) 25.0-25.9, adult: Secondary | ICD-10-CM | POA: Diagnosis not present

## 2019-10-19 DIAGNOSIS — R197 Diarrhea, unspecified: Secondary | ICD-10-CM | POA: Diagnosis not present

## 2019-11-01 DIAGNOSIS — D469 Myelodysplastic syndrome, unspecified: Secondary | ICD-10-CM | POA: Diagnosis not present

## 2019-11-01 DIAGNOSIS — C61 Malignant neoplasm of prostate: Secondary | ICD-10-CM | POA: Diagnosis not present

## 2019-11-01 DIAGNOSIS — D500 Iron deficiency anemia secondary to blood loss (chronic): Secondary | ICD-10-CM | POA: Diagnosis not present

## 2019-11-04 DIAGNOSIS — D469 Myelodysplastic syndrome, unspecified: Secondary | ICD-10-CM | POA: Diagnosis not present

## 2019-11-11 DIAGNOSIS — K633 Ulcer of intestine: Secondary | ICD-10-CM | POA: Diagnosis not present

## 2019-11-11 DIAGNOSIS — D469 Myelodysplastic syndrome, unspecified: Secondary | ICD-10-CM | POA: Diagnosis not present

## 2019-11-11 DIAGNOSIS — D500 Iron deficiency anemia secondary to blood loss (chronic): Secondary | ICD-10-CM | POA: Diagnosis not present

## 2019-11-12 DIAGNOSIS — C61 Malignant neoplasm of prostate: Secondary | ICD-10-CM | POA: Diagnosis not present

## 2019-11-12 DIAGNOSIS — R946 Abnormal results of thyroid function studies: Secondary | ICD-10-CM | POA: Diagnosis not present

## 2019-11-12 DIAGNOSIS — E1140 Type 2 diabetes mellitus with diabetic neuropathy, unspecified: Secondary | ICD-10-CM | POA: Diagnosis not present

## 2019-11-12 DIAGNOSIS — K5730 Diverticulosis of large intestine without perforation or abscess without bleeding: Secondary | ICD-10-CM | POA: Diagnosis not present

## 2019-11-12 DIAGNOSIS — K219 Gastro-esophageal reflux disease without esophagitis: Secondary | ICD-10-CM | POA: Diagnosis not present

## 2019-11-12 DIAGNOSIS — D469 Myelodysplastic syndrome, unspecified: Secondary | ICD-10-CM | POA: Diagnosis not present

## 2019-11-12 DIAGNOSIS — R5383 Other fatigue: Secondary | ICD-10-CM | POA: Diagnosis not present

## 2019-11-12 DIAGNOSIS — I10 Essential (primary) hypertension: Secondary | ICD-10-CM | POA: Diagnosis not present

## 2019-11-12 DIAGNOSIS — E1165 Type 2 diabetes mellitus with hyperglycemia: Secondary | ICD-10-CM | POA: Diagnosis not present

## 2019-11-12 DIAGNOSIS — E1169 Type 2 diabetes mellitus with other specified complication: Secondary | ICD-10-CM | POA: Diagnosis not present

## 2019-11-18 DIAGNOSIS — I10 Essential (primary) hypertension: Secondary | ICD-10-CM | POA: Diagnosis not present

## 2019-11-18 DIAGNOSIS — E1140 Type 2 diabetes mellitus with diabetic neuropathy, unspecified: Secondary | ICD-10-CM | POA: Diagnosis not present

## 2019-11-18 DIAGNOSIS — R739 Hyperglycemia, unspecified: Secondary | ICD-10-CM | POA: Diagnosis not present

## 2019-11-18 DIAGNOSIS — Z6825 Body mass index (BMI) 25.0-25.9, adult: Secondary | ICD-10-CM | POA: Diagnosis not present

## 2019-11-18 DIAGNOSIS — D649 Anemia, unspecified: Secondary | ICD-10-CM | POA: Diagnosis not present

## 2019-12-06 DIAGNOSIS — R7989 Other specified abnormal findings of blood chemistry: Secondary | ICD-10-CM | POA: Diagnosis not present

## 2019-12-06 DIAGNOSIS — M818 Other osteoporosis without current pathological fracture: Secondary | ICD-10-CM | POA: Diagnosis not present

## 2019-12-06 DIAGNOSIS — T380X5A Adverse effect of glucocorticoids and synthetic analogues, initial encounter: Secondary | ICD-10-CM | POA: Diagnosis not present

## 2019-12-06 DIAGNOSIS — M1120 Other chondrocalcinosis, unspecified site: Secondary | ICD-10-CM | POA: Diagnosis not present

## 2019-12-06 DIAGNOSIS — D500 Iron deficiency anemia secondary to blood loss (chronic): Secondary | ICD-10-CM | POA: Diagnosis not present

## 2019-12-06 DIAGNOSIS — C61 Malignant neoplasm of prostate: Secondary | ICD-10-CM | POA: Diagnosis not present

## 2019-12-06 DIAGNOSIS — D469 Myelodysplastic syndrome, unspecified: Secondary | ICD-10-CM | POA: Diagnosis not present

## 2019-12-13 DIAGNOSIS — Z96653 Presence of artificial knee joint, bilateral: Secondary | ICD-10-CM | POA: Diagnosis not present

## 2019-12-13 DIAGNOSIS — K8020 Calculus of gallbladder without cholecystitis without obstruction: Secondary | ICD-10-CM | POA: Diagnosis not present

## 2019-12-13 DIAGNOSIS — Z86718 Personal history of other venous thrombosis and embolism: Secondary | ICD-10-CM | POA: Diagnosis not present

## 2019-12-13 DIAGNOSIS — C61 Malignant neoplasm of prostate: Secondary | ICD-10-CM | POA: Diagnosis not present

## 2019-12-13 DIAGNOSIS — M1990 Unspecified osteoarthritis, unspecified site: Secondary | ICD-10-CM | POA: Diagnosis not present

## 2019-12-13 DIAGNOSIS — Z91041 Radiographic dye allergy status: Secondary | ICD-10-CM | POA: Diagnosis not present

## 2019-12-13 DIAGNOSIS — M79672 Pain in left foot: Secondary | ICD-10-CM | POA: Diagnosis not present

## 2019-12-13 DIAGNOSIS — Z7189 Other specified counseling: Secondary | ICD-10-CM | POA: Diagnosis not present

## 2019-12-13 DIAGNOSIS — K5790 Diverticulosis of intestine, part unspecified, without perforation or abscess without bleeding: Secondary | ICD-10-CM | POA: Diagnosis not present

## 2019-12-13 DIAGNOSIS — R7989 Other specified abnormal findings of blood chemistry: Secondary | ICD-10-CM | POA: Diagnosis not present

## 2019-12-13 DIAGNOSIS — E213 Hyperparathyroidism, unspecified: Secondary | ICD-10-CM | POA: Diagnosis not present

## 2019-12-13 DIAGNOSIS — D759 Disease of blood and blood-forming organs, unspecified: Secondary | ICD-10-CM | POA: Diagnosis not present

## 2019-12-13 DIAGNOSIS — M069 Rheumatoid arthritis, unspecified: Secondary | ICD-10-CM | POA: Diagnosis not present

## 2019-12-13 DIAGNOSIS — K2970 Gastritis, unspecified, without bleeding: Secondary | ICD-10-CM | POA: Diagnosis not present

## 2019-12-13 DIAGNOSIS — D6859 Other primary thrombophilia: Secondary | ICD-10-CM | POA: Diagnosis not present

## 2019-12-13 DIAGNOSIS — N529 Male erectile dysfunction, unspecified: Secondary | ICD-10-CM | POA: Diagnosis not present

## 2019-12-13 DIAGNOSIS — D539 Nutritional anemia, unspecified: Secondary | ICD-10-CM | POA: Diagnosis not present

## 2019-12-13 DIAGNOSIS — C50929 Malignant neoplasm of unspecified site of unspecified male breast: Secondary | ICD-10-CM | POA: Diagnosis not present

## 2019-12-13 DIAGNOSIS — R7982 Elevated C-reactive protein (CRP): Secondary | ICD-10-CM | POA: Diagnosis not present

## 2019-12-13 DIAGNOSIS — M899 Disorder of bone, unspecified: Secondary | ICD-10-CM | POA: Diagnosis not present

## 2019-12-13 DIAGNOSIS — Z9079 Acquired absence of other genital organ(s): Secondary | ICD-10-CM | POA: Diagnosis not present

## 2019-12-13 DIAGNOSIS — M79641 Pain in right hand: Secondary | ICD-10-CM | POA: Diagnosis not present

## 2019-12-13 DIAGNOSIS — E1165 Type 2 diabetes mellitus with hyperglycemia: Secondary | ICD-10-CM | POA: Diagnosis not present

## 2019-12-13 DIAGNOSIS — Z7952 Long term (current) use of systemic steroids: Secondary | ICD-10-CM | POA: Diagnosis not present

## 2019-12-13 DIAGNOSIS — C9000 Multiple myeloma not having achieved remission: Secondary | ICD-10-CM | POA: Diagnosis not present

## 2019-12-13 DIAGNOSIS — M79642 Pain in left hand: Secondary | ICD-10-CM | POA: Diagnosis not present

## 2019-12-13 DIAGNOSIS — M25531 Pain in right wrist: Secondary | ICD-10-CM | POA: Diagnosis not present

## 2019-12-13 DIAGNOSIS — M1120 Other chondrocalcinosis, unspecified site: Secondary | ICD-10-CM | POA: Diagnosis not present

## 2019-12-13 DIAGNOSIS — D500 Iron deficiency anemia secondary to blood loss (chronic): Secondary | ICD-10-CM | POA: Diagnosis not present

## 2019-12-13 DIAGNOSIS — Z8546 Personal history of malignant neoplasm of prostate: Secondary | ICD-10-CM | POA: Diagnosis not present

## 2019-12-13 DIAGNOSIS — D469 Myelodysplastic syndrome, unspecified: Secondary | ICD-10-CM | POA: Diagnosis not present

## 2019-12-13 DIAGNOSIS — C9200 Acute myeloblastic leukemia, not having achieved remission: Secondary | ICD-10-CM | POA: Diagnosis not present

## 2019-12-13 DIAGNOSIS — R9721 Rising PSA following treatment for malignant neoplasm of prostate: Secondary | ICD-10-CM | POA: Diagnosis not present

## 2019-12-28 DIAGNOSIS — M1180 Other specified crystal arthropathies, unspecified site: Secondary | ICD-10-CM | POA: Diagnosis not present

## 2020-01-05 DIAGNOSIS — Z23 Encounter for immunization: Secondary | ICD-10-CM | POA: Diagnosis not present

## 2020-01-06 DIAGNOSIS — Z1501 Genetic susceptibility to malignant neoplasm of breast: Secondary | ICD-10-CM | POA: Diagnosis not present

## 2020-01-06 DIAGNOSIS — D508 Other iron deficiency anemias: Secondary | ICD-10-CM | POA: Diagnosis not present

## 2020-01-06 DIAGNOSIS — M1120 Other chondrocalcinosis, unspecified site: Secondary | ICD-10-CM | POA: Diagnosis not present

## 2020-01-06 DIAGNOSIS — D469 Myelodysplastic syndrome, unspecified: Secondary | ICD-10-CM | POA: Diagnosis not present

## 2020-01-06 DIAGNOSIS — Z452 Encounter for adjustment and management of vascular access device: Secondary | ICD-10-CM | POA: Diagnosis not present

## 2020-01-06 DIAGNOSIS — C61 Malignant neoplasm of prostate: Secondary | ICD-10-CM | POA: Diagnosis not present

## 2020-01-06 DIAGNOSIS — K633 Ulcer of intestine: Secondary | ICD-10-CM | POA: Diagnosis not present

## 2020-01-06 DIAGNOSIS — R7989 Other specified abnormal findings of blood chemistry: Secondary | ICD-10-CM | POA: Diagnosis not present

## 2020-01-07 DIAGNOSIS — Z23 Encounter for immunization: Secondary | ICD-10-CM | POA: Diagnosis not present

## 2020-01-10 DIAGNOSIS — M1120 Other chondrocalcinosis, unspecified site: Secondary | ICD-10-CM | POA: Diagnosis not present

## 2020-01-10 DIAGNOSIS — D469 Myelodysplastic syndrome, unspecified: Secondary | ICD-10-CM | POA: Diagnosis not present

## 2020-01-10 DIAGNOSIS — D500 Iron deficiency anemia secondary to blood loss (chronic): Secondary | ICD-10-CM | POA: Diagnosis not present

## 2020-01-10 DIAGNOSIS — Z1501 Genetic susceptibility to malignant neoplasm of breast: Secondary | ICD-10-CM | POA: Diagnosis not present

## 2020-01-10 DIAGNOSIS — M1990 Unspecified osteoarthritis, unspecified site: Secondary | ICD-10-CM | POA: Diagnosis not present

## 2020-01-10 DIAGNOSIS — Z7189 Other specified counseling: Secondary | ICD-10-CM | POA: Diagnosis not present

## 2020-01-10 DIAGNOSIS — C61 Malignant neoplasm of prostate: Secondary | ICD-10-CM | POA: Diagnosis not present

## 2020-01-11 DIAGNOSIS — D0462 Carcinoma in situ of skin of left upper limb, including shoulder: Secondary | ICD-10-CM | POA: Diagnosis not present

## 2020-01-11 DIAGNOSIS — L570 Actinic keratosis: Secondary | ICD-10-CM | POA: Diagnosis not present

## 2020-01-11 DIAGNOSIS — C44329 Squamous cell carcinoma of skin of other parts of face: Secondary | ICD-10-CM | POA: Diagnosis not present

## 2020-01-11 DIAGNOSIS — D0439 Carcinoma in situ of skin of other parts of face: Secondary | ICD-10-CM | POA: Diagnosis not present

## 2020-01-11 DIAGNOSIS — C44629 Squamous cell carcinoma of skin of left upper limb, including shoulder: Secondary | ICD-10-CM | POA: Diagnosis not present

## 2020-01-14 DIAGNOSIS — M19032 Primary osteoarthritis, left wrist: Secondary | ICD-10-CM | POA: Diagnosis not present

## 2020-01-14 DIAGNOSIS — M19031 Primary osteoarthritis, right wrist: Secondary | ICD-10-CM | POA: Diagnosis not present

## 2020-01-20 DIAGNOSIS — C44329 Squamous cell carcinoma of skin of other parts of face: Secondary | ICD-10-CM | POA: Diagnosis not present

## 2020-02-04 DIAGNOSIS — C61 Malignant neoplasm of prostate: Secondary | ICD-10-CM | POA: Diagnosis not present

## 2020-02-04 DIAGNOSIS — D508 Other iron deficiency anemias: Secondary | ICD-10-CM | POA: Diagnosis not present

## 2020-02-04 DIAGNOSIS — D469 Myelodysplastic syndrome, unspecified: Secondary | ICD-10-CM | POA: Diagnosis not present

## 2020-02-07 DIAGNOSIS — D500 Iron deficiency anemia secondary to blood loss (chronic): Secondary | ICD-10-CM | POA: Diagnosis not present

## 2020-02-07 DIAGNOSIS — Z1501 Genetic susceptibility to malignant neoplasm of breast: Secondary | ICD-10-CM | POA: Diagnosis not present

## 2020-02-07 DIAGNOSIS — D472 Monoclonal gammopathy: Secondary | ICD-10-CM | POA: Diagnosis not present

## 2020-02-07 DIAGNOSIS — M1120 Other chondrocalcinosis, unspecified site: Secondary | ICD-10-CM | POA: Diagnosis not present

## 2020-02-07 DIAGNOSIS — D469 Myelodysplastic syndrome, unspecified: Secondary | ICD-10-CM | POA: Diagnosis not present

## 2020-02-07 DIAGNOSIS — M1990 Unspecified osteoarthritis, unspecified site: Secondary | ICD-10-CM | POA: Diagnosis not present

## 2020-02-07 DIAGNOSIS — R7989 Other specified abnormal findings of blood chemistry: Secondary | ICD-10-CM | POA: Diagnosis not present

## 2020-02-14 DIAGNOSIS — Z23 Encounter for immunization: Secondary | ICD-10-CM | POA: Diagnosis not present

## 2020-02-21 DIAGNOSIS — D469 Myelodysplastic syndrome, unspecified: Secondary | ICD-10-CM | POA: Diagnosis not present

## 2020-02-21 DIAGNOSIS — D508 Other iron deficiency anemias: Secondary | ICD-10-CM | POA: Diagnosis not present

## 2020-02-21 DIAGNOSIS — M9272 Juvenile osteochondrosis of metatarsus, left foot: Secondary | ICD-10-CM | POA: Diagnosis not present

## 2020-02-22 DIAGNOSIS — D469 Myelodysplastic syndrome, unspecified: Secondary | ICD-10-CM | POA: Diagnosis not present

## 2020-02-22 DIAGNOSIS — Z95828 Presence of other vascular implants and grafts: Secondary | ICD-10-CM | POA: Diagnosis not present

## 2020-03-20 DIAGNOSIS — M9272 Juvenile osteochondrosis of metatarsus, left foot: Secondary | ICD-10-CM | POA: Diagnosis not present

## 2020-03-20 DIAGNOSIS — D469 Myelodysplastic syndrome, unspecified: Secondary | ICD-10-CM | POA: Diagnosis not present

## 2020-03-20 DIAGNOSIS — M1990 Unspecified osteoarthritis, unspecified site: Secondary | ICD-10-CM | POA: Diagnosis not present

## 2020-03-20 DIAGNOSIS — R7989 Other specified abnormal findings of blood chemistry: Secondary | ICD-10-CM | POA: Diagnosis not present

## 2020-03-21 DIAGNOSIS — Z1501 Genetic susceptibility to malignant neoplasm of breast: Secondary | ICD-10-CM | POA: Diagnosis not present

## 2020-03-21 DIAGNOSIS — M1990 Unspecified osteoarthritis, unspecified site: Secondary | ICD-10-CM | POA: Diagnosis not present

## 2020-03-21 DIAGNOSIS — Z7952 Long term (current) use of systemic steroids: Secondary | ICD-10-CM | POA: Diagnosis not present

## 2020-03-21 DIAGNOSIS — E119 Type 2 diabetes mellitus without complications: Secondary | ICD-10-CM | POA: Diagnosis not present

## 2020-03-21 DIAGNOSIS — M069 Rheumatoid arthritis, unspecified: Secondary | ICD-10-CM | POA: Diagnosis not present

## 2020-03-21 DIAGNOSIS — Z86718 Personal history of other venous thrombosis and embolism: Secondary | ICD-10-CM | POA: Diagnosis not present

## 2020-03-21 DIAGNOSIS — Z794 Long term (current) use of insulin: Secondary | ICD-10-CM | POA: Diagnosis not present

## 2020-03-21 DIAGNOSIS — Z85828 Personal history of other malignant neoplasm of skin: Secondary | ICD-10-CM | POA: Diagnosis not present

## 2020-03-21 DIAGNOSIS — Z79899 Other long term (current) drug therapy: Secondary | ICD-10-CM | POA: Diagnosis not present

## 2020-03-21 DIAGNOSIS — R7989 Other specified abnormal findings of blood chemistry: Secondary | ICD-10-CM | POA: Diagnosis not present

## 2020-03-21 DIAGNOSIS — C61 Malignant neoplasm of prostate: Secondary | ICD-10-CM | POA: Diagnosis not present

## 2020-03-21 DIAGNOSIS — D469 Myelodysplastic syndrome, unspecified: Secondary | ICD-10-CM | POA: Diagnosis not present

## 2020-03-25 DIAGNOSIS — E1140 Type 2 diabetes mellitus with diabetic neuropathy, unspecified: Secondary | ICD-10-CM | POA: Diagnosis not present

## 2020-03-25 DIAGNOSIS — R509 Fever, unspecified: Secondary | ICD-10-CM | POA: Diagnosis not present

## 2020-03-25 DIAGNOSIS — D649 Anemia, unspecified: Secondary | ICD-10-CM | POA: Diagnosis not present

## 2020-03-25 DIAGNOSIS — Z20828 Contact with and (suspected) exposure to other viral communicable diseases: Secondary | ICD-10-CM | POA: Diagnosis not present

## 2020-04-07 DIAGNOSIS — Z96642 Presence of left artificial hip joint: Secondary | ICD-10-CM | POA: Diagnosis not present

## 2020-04-07 DIAGNOSIS — I7090 Unspecified atherosclerosis: Secondary | ICD-10-CM | POA: Diagnosis not present

## 2020-04-07 DIAGNOSIS — I82412 Acute embolism and thrombosis of left femoral vein: Secondary | ICD-10-CM | POA: Diagnosis not present

## 2020-04-07 DIAGNOSIS — M79662 Pain in left lower leg: Secondary | ICD-10-CM | POA: Diagnosis not present

## 2020-04-07 DIAGNOSIS — Z86718 Personal history of other venous thrombosis and embolism: Secondary | ICD-10-CM | POA: Diagnosis not present

## 2020-04-07 DIAGNOSIS — Z794 Long term (current) use of insulin: Secondary | ICD-10-CM | POA: Diagnosis not present

## 2020-04-07 DIAGNOSIS — E119 Type 2 diabetes mellitus without complications: Secondary | ICD-10-CM | POA: Diagnosis not present

## 2020-04-07 DIAGNOSIS — M79605 Pain in left leg: Secondary | ICD-10-CM | POA: Diagnosis not present

## 2020-04-07 DIAGNOSIS — I82812 Embolism and thrombosis of superficial veins of left lower extremities: Secondary | ICD-10-CM | POA: Diagnosis not present

## 2020-04-14 DIAGNOSIS — M19039 Primary osteoarthritis, unspecified wrist: Secondary | ICD-10-CM | POA: Diagnosis not present

## 2020-04-14 DIAGNOSIS — M1180 Other specified crystal arthropathies, unspecified site: Secondary | ICD-10-CM | POA: Diagnosis not present

## 2020-04-17 DIAGNOSIS — M9272 Juvenile osteochondrosis of metatarsus, left foot: Secondary | ICD-10-CM | POA: Diagnosis not present

## 2020-04-17 DIAGNOSIS — D508 Other iron deficiency anemias: Secondary | ICD-10-CM | POA: Diagnosis not present

## 2020-04-17 DIAGNOSIS — D469 Myelodysplastic syndrome, unspecified: Secondary | ICD-10-CM | POA: Diagnosis not present

## 2020-04-17 DIAGNOSIS — C61 Malignant neoplasm of prostate: Secondary | ICD-10-CM | POA: Diagnosis not present

## 2020-05-01 DIAGNOSIS — M19031 Primary osteoarthritis, right wrist: Secondary | ICD-10-CM | POA: Diagnosis not present

## 2020-05-01 DIAGNOSIS — D469 Myelodysplastic syndrome, unspecified: Secondary | ICD-10-CM | POA: Diagnosis not present

## 2020-05-01 DIAGNOSIS — Z4689 Encounter for fitting and adjustment of other specified devices: Secondary | ICD-10-CM | POA: Diagnosis not present

## 2020-05-01 DIAGNOSIS — M19032 Primary osteoarthritis, left wrist: Secondary | ICD-10-CM | POA: Diagnosis not present

## 2020-05-01 DIAGNOSIS — M25532 Pain in left wrist: Secondary | ICD-10-CM | POA: Diagnosis not present

## 2020-05-01 DIAGNOSIS — M1990 Unspecified osteoarthritis, unspecified site: Secondary | ICD-10-CM | POA: Diagnosis not present

## 2020-05-01 DIAGNOSIS — M25531 Pain in right wrist: Secondary | ICD-10-CM | POA: Diagnosis not present

## 2020-05-01 DIAGNOSIS — C61 Malignant neoplasm of prostate: Secondary | ICD-10-CM | POA: Diagnosis not present

## 2020-05-02 DIAGNOSIS — Z91041 Radiographic dye allergy status: Secondary | ICD-10-CM | POA: Diagnosis not present

## 2020-05-02 DIAGNOSIS — Z1501 Genetic susceptibility to malignant neoplasm of breast: Secondary | ICD-10-CM | POA: Diagnosis not present

## 2020-05-02 DIAGNOSIS — C9000 Multiple myeloma not having achieved remission: Secondary | ICD-10-CM | POA: Diagnosis not present

## 2020-05-02 DIAGNOSIS — Z8546 Personal history of malignant neoplasm of prostate: Secondary | ICD-10-CM | POA: Diagnosis not present

## 2020-05-02 DIAGNOSIS — Z7189 Other specified counseling: Secondary | ICD-10-CM | POA: Diagnosis not present

## 2020-05-02 DIAGNOSIS — M1990 Unspecified osteoarthritis, unspecified site: Secondary | ICD-10-CM | POA: Diagnosis not present

## 2020-05-02 DIAGNOSIS — D469 Myelodysplastic syndrome, unspecified: Secondary | ICD-10-CM | POA: Diagnosis not present

## 2020-05-02 DIAGNOSIS — D500 Iron deficiency anemia secondary to blood loss (chronic): Secondary | ICD-10-CM | POA: Diagnosis not present

## 2020-05-02 DIAGNOSIS — M1120 Other chondrocalcinosis, unspecified site: Secondary | ICD-10-CM | POA: Diagnosis not present

## 2020-05-02 DIAGNOSIS — E119 Type 2 diabetes mellitus without complications: Secondary | ICD-10-CM | POA: Diagnosis not present

## 2020-05-13 DIAGNOSIS — H9201 Otalgia, right ear: Secondary | ICD-10-CM | POA: Diagnosis not present

## 2020-05-13 DIAGNOSIS — G629 Polyneuropathy, unspecified: Secondary | ICD-10-CM | POA: Diagnosis not present

## 2020-05-13 DIAGNOSIS — Z6826 Body mass index (BMI) 26.0-26.9, adult: Secondary | ICD-10-CM | POA: Diagnosis not present

## 2020-05-15 DIAGNOSIS — L0100 Impetigo, unspecified: Secondary | ICD-10-CM | POA: Diagnosis not present

## 2020-05-15 DIAGNOSIS — L03211 Cellulitis of face: Secondary | ICD-10-CM | POA: Diagnosis not present

## 2020-05-16 DIAGNOSIS — L239 Allergic contact dermatitis, unspecified cause: Secondary | ICD-10-CM | POA: Diagnosis not present

## 2020-05-18 DIAGNOSIS — L03211 Cellulitis of face: Secondary | ICD-10-CM | POA: Diagnosis not present

## 2020-05-18 DIAGNOSIS — H70091 Acute mastoiditis with other complications, right ear: Secondary | ICD-10-CM | POA: Diagnosis not present

## 2020-05-18 DIAGNOSIS — H7091 Unspecified mastoiditis, right ear: Secondary | ICD-10-CM | POA: Diagnosis not present

## 2020-05-18 DIAGNOSIS — D84822 Immunodeficiency due to external causes: Secondary | ICD-10-CM | POA: Diagnosis not present

## 2020-05-18 DIAGNOSIS — D469 Myelodysplastic syndrome, unspecified: Secondary | ICD-10-CM | POA: Diagnosis not present

## 2020-05-18 DIAGNOSIS — R21 Rash and other nonspecific skin eruption: Secondary | ICD-10-CM | POA: Diagnosis not present

## 2020-05-18 DIAGNOSIS — K219 Gastro-esophageal reflux disease without esophagitis: Secondary | ICD-10-CM | POA: Diagnosis not present

## 2020-05-18 DIAGNOSIS — B0221 Postherpetic geniculate ganglionitis: Secondary | ICD-10-CM | POA: Diagnosis not present

## 2020-05-19 DIAGNOSIS — R21 Rash and other nonspecific skin eruption: Secondary | ICD-10-CM | POA: Diagnosis not present

## 2020-05-19 DIAGNOSIS — B029 Zoster without complications: Secondary | ICD-10-CM | POA: Diagnosis not present

## 2020-05-19 DIAGNOSIS — Z8546 Personal history of malignant neoplasm of prostate: Secondary | ICD-10-CM | POA: Diagnosis not present

## 2020-05-19 DIAGNOSIS — Z794 Long term (current) use of insulin: Secondary | ICD-10-CM | POA: Diagnosis not present

## 2020-05-19 DIAGNOSIS — G501 Atypical facial pain: Secondary | ICD-10-CM | POA: Diagnosis not present

## 2020-05-19 DIAGNOSIS — D84822 Immunodeficiency due to external causes: Secondary | ICD-10-CM | POA: Diagnosis present

## 2020-05-19 DIAGNOSIS — D638 Anemia in other chronic diseases classified elsewhere: Secondary | ICD-10-CM | POA: Diagnosis not present

## 2020-05-19 DIAGNOSIS — D472 Monoclonal gammopathy: Secondary | ICD-10-CM | POA: Diagnosis present

## 2020-05-19 DIAGNOSIS — F17221 Nicotine dependence, chewing tobacco, in remission: Secondary | ICD-10-CM | POA: Diagnosis present

## 2020-05-19 DIAGNOSIS — K219 Gastro-esophageal reflux disease without esophagitis: Secondary | ICD-10-CM | POA: Diagnosis present

## 2020-05-19 DIAGNOSIS — K121 Other forms of stomatitis: Secondary | ICD-10-CM | POA: Diagnosis not present

## 2020-05-19 DIAGNOSIS — Z91041 Radiographic dye allergy status: Secondary | ICD-10-CM | POA: Diagnosis not present

## 2020-05-19 DIAGNOSIS — D649 Anemia, unspecified: Secondary | ICD-10-CM | POA: Diagnosis not present

## 2020-05-19 DIAGNOSIS — H9201 Otalgia, right ear: Secondary | ICD-10-CM | POA: Diagnosis not present

## 2020-05-19 DIAGNOSIS — H70091 Acute mastoiditis with other complications, right ear: Secondary | ICD-10-CM | POA: Diagnosis present

## 2020-05-19 DIAGNOSIS — H70001 Acute mastoiditis without complications, right ear: Secondary | ICD-10-CM | POA: Diagnosis not present

## 2020-05-19 DIAGNOSIS — D469 Myelodysplastic syndrome, unspecified: Secondary | ICD-10-CM | POA: Diagnosis present

## 2020-05-19 DIAGNOSIS — R220 Localized swelling, mass and lump, head: Secondary | ICD-10-CM | POA: Diagnosis not present

## 2020-05-19 DIAGNOSIS — M7989 Other specified soft tissue disorders: Secondary | ICD-10-CM | POA: Diagnosis not present

## 2020-05-19 DIAGNOSIS — B0221 Postherpetic geniculate ganglionitis: Secondary | ICD-10-CM | POA: Diagnosis present

## 2020-05-19 DIAGNOSIS — Z7952 Long term (current) use of systemic steroids: Secondary | ICD-10-CM | POA: Diagnosis not present

## 2020-05-19 DIAGNOSIS — H9191 Unspecified hearing loss, right ear: Secondary | ICD-10-CM | POA: Diagnosis present

## 2020-05-19 DIAGNOSIS — H7091 Unspecified mastoiditis, right ear: Secondary | ICD-10-CM | POA: Diagnosis not present

## 2020-05-19 DIAGNOSIS — Z86718 Personal history of other venous thrombosis and embolism: Secondary | ICD-10-CM | POA: Diagnosis not present

## 2020-05-19 DIAGNOSIS — Z96653 Presence of artificial knee joint, bilateral: Secondary | ICD-10-CM | POA: Diagnosis present

## 2020-05-19 DIAGNOSIS — H6091 Unspecified otitis externa, right ear: Secondary | ICD-10-CM | POA: Diagnosis not present

## 2020-05-19 DIAGNOSIS — L03211 Cellulitis of face: Secondary | ICD-10-CM | POA: Diagnosis not present

## 2020-05-19 DIAGNOSIS — E119 Type 2 diabetes mellitus without complications: Secondary | ICD-10-CM | POA: Diagnosis not present

## 2020-05-26 DIAGNOSIS — C61 Malignant neoplasm of prostate: Secondary | ICD-10-CM | POA: Diagnosis not present

## 2020-05-26 DIAGNOSIS — D508 Other iron deficiency anemias: Secondary | ICD-10-CM | POA: Diagnosis not present

## 2020-05-26 DIAGNOSIS — R7989 Other specified abnormal findings of blood chemistry: Secondary | ICD-10-CM | POA: Diagnosis not present

## 2020-05-26 DIAGNOSIS — M9272 Juvenile osteochondrosis of metatarsus, left foot: Secondary | ICD-10-CM | POA: Diagnosis not present

## 2020-05-30 DIAGNOSIS — B0229 Other postherpetic nervous system involvement: Secondary | ICD-10-CM | POA: Diagnosis not present

## 2020-05-30 DIAGNOSIS — M069 Rheumatoid arthritis, unspecified: Secondary | ICD-10-CM | POA: Diagnosis not present

## 2020-05-30 DIAGNOSIS — Z86718 Personal history of other venous thrombosis and embolism: Secondary | ICD-10-CM | POA: Diagnosis not present

## 2020-05-30 DIAGNOSIS — Z7952 Long term (current) use of systemic steroids: Secondary | ICD-10-CM | POA: Diagnosis not present

## 2020-05-30 DIAGNOSIS — Z1501 Genetic susceptibility to malignant neoplasm of breast: Secondary | ICD-10-CM | POA: Diagnosis not present

## 2020-05-30 DIAGNOSIS — N529 Male erectile dysfunction, unspecified: Secondary | ICD-10-CM | POA: Diagnosis not present

## 2020-05-30 DIAGNOSIS — D469 Myelodysplastic syndrome, unspecified: Secondary | ICD-10-CM | POA: Diagnosis not present

## 2020-05-30 DIAGNOSIS — D539 Nutritional anemia, unspecified: Secondary | ICD-10-CM | POA: Diagnosis not present

## 2020-05-30 DIAGNOSIS — E119 Type 2 diabetes mellitus without complications: Secondary | ICD-10-CM | POA: Diagnosis not present

## 2020-05-30 DIAGNOSIS — Z794 Long term (current) use of insulin: Secondary | ICD-10-CM | POA: Diagnosis not present

## 2020-05-30 DIAGNOSIS — Z79899 Other long term (current) drug therapy: Secondary | ICD-10-CM | POA: Diagnosis not present

## 2020-05-30 DIAGNOSIS — E099 Drug or chemical induced diabetes mellitus without complications: Secondary | ICD-10-CM | POA: Diagnosis not present

## 2020-05-30 DIAGNOSIS — B029 Zoster without complications: Secondary | ICD-10-CM | POA: Diagnosis not present

## 2020-05-30 DIAGNOSIS — M1120 Other chondrocalcinosis, unspecified site: Secondary | ICD-10-CM | POA: Diagnosis not present

## 2020-05-30 DIAGNOSIS — R7989 Other specified abnormal findings of blood chemistry: Secondary | ICD-10-CM | POA: Diagnosis not present

## 2020-05-30 DIAGNOSIS — D500 Iron deficiency anemia secondary to blood loss (chronic): Secondary | ICD-10-CM | POA: Diagnosis not present

## 2020-05-30 DIAGNOSIS — Z85828 Personal history of other malignant neoplasm of skin: Secondary | ICD-10-CM | POA: Diagnosis not present

## 2020-05-30 DIAGNOSIS — C61 Malignant neoplasm of prostate: Secondary | ICD-10-CM | POA: Diagnosis not present

## 2020-05-30 DIAGNOSIS — K8020 Calculus of gallbladder without cholecystitis without obstruction: Secondary | ICD-10-CM | POA: Diagnosis not present

## 2020-05-30 DIAGNOSIS — D472 Monoclonal gammopathy: Secondary | ICD-10-CM | POA: Diagnosis not present

## 2020-05-30 DIAGNOSIS — B019 Varicella without complication: Secondary | ICD-10-CM | POA: Diagnosis not present

## 2020-05-31 DIAGNOSIS — B0229 Other postherpetic nervous system involvement: Secondary | ICD-10-CM | POA: Diagnosis not present

## 2020-05-31 DIAGNOSIS — Z6825 Body mass index (BMI) 25.0-25.9, adult: Secondary | ICD-10-CM | POA: Diagnosis not present

## 2020-06-07 DIAGNOSIS — E1151 Type 2 diabetes mellitus with diabetic peripheral angiopathy without gangrene: Secondary | ICD-10-CM | POA: Diagnosis not present

## 2020-06-07 DIAGNOSIS — M79672 Pain in left foot: Secondary | ICD-10-CM | POA: Diagnosis not present

## 2020-06-07 DIAGNOSIS — E1140 Type 2 diabetes mellitus with diabetic neuropathy, unspecified: Secondary | ICD-10-CM | POA: Diagnosis not present

## 2020-06-07 DIAGNOSIS — M79671 Pain in right foot: Secondary | ICD-10-CM | POA: Diagnosis not present

## 2020-06-12 DIAGNOSIS — M7750 Other enthesopathy of unspecified foot: Secondary | ICD-10-CM | POA: Diagnosis not present

## 2020-06-12 DIAGNOSIS — M1180 Other specified crystal arthropathies, unspecified site: Secondary | ICD-10-CM | POA: Diagnosis not present

## 2020-06-12 DIAGNOSIS — E119 Type 2 diabetes mellitus without complications: Secondary | ICD-10-CM | POA: Diagnosis not present

## 2020-06-12 DIAGNOSIS — Z961 Presence of intraocular lens: Secondary | ICD-10-CM | POA: Diagnosis not present

## 2020-06-12 DIAGNOSIS — H43811 Vitreous degeneration, right eye: Secondary | ICD-10-CM | POA: Diagnosis not present

## 2020-06-12 DIAGNOSIS — Z6825 Body mass index (BMI) 25.0-25.9, adult: Secondary | ICD-10-CM | POA: Diagnosis not present

## 2020-06-12 DIAGNOSIS — M79671 Pain in right foot: Secondary | ICD-10-CM | POA: Diagnosis not present

## 2020-06-12 DIAGNOSIS — Z79899 Other long term (current) drug therapy: Secondary | ICD-10-CM | POA: Diagnosis not present

## 2020-06-13 DIAGNOSIS — E1165 Type 2 diabetes mellitus with hyperglycemia: Secondary | ICD-10-CM | POA: Diagnosis not present

## 2020-06-13 DIAGNOSIS — K219 Gastro-esophageal reflux disease without esophagitis: Secondary | ICD-10-CM | POA: Diagnosis not present

## 2020-06-13 DIAGNOSIS — I10 Essential (primary) hypertension: Secondary | ICD-10-CM | POA: Diagnosis not present

## 2020-06-13 DIAGNOSIS — R946 Abnormal results of thyroid function studies: Secondary | ICD-10-CM | POA: Diagnosis not present

## 2020-06-13 DIAGNOSIS — E1169 Type 2 diabetes mellitus with other specified complication: Secondary | ICD-10-CM | POA: Diagnosis not present

## 2020-06-13 DIAGNOSIS — E1140 Type 2 diabetes mellitus with diabetic neuropathy, unspecified: Secondary | ICD-10-CM | POA: Diagnosis not present

## 2020-06-13 DIAGNOSIS — Z1329 Encounter for screening for other suspected endocrine disorder: Secondary | ICD-10-CM | POA: Diagnosis not present

## 2020-06-13 DIAGNOSIS — E538 Deficiency of other specified B group vitamins: Secondary | ICD-10-CM | POA: Diagnosis not present

## 2020-06-13 DIAGNOSIS — C61 Malignant neoplasm of prostate: Secondary | ICD-10-CM | POA: Diagnosis not present

## 2020-06-16 DIAGNOSIS — Z1389 Encounter for screening for other disorder: Secondary | ICD-10-CM | POA: Diagnosis not present

## 2020-06-16 DIAGNOSIS — Z1331 Encounter for screening for depression: Secondary | ICD-10-CM | POA: Diagnosis not present

## 2020-06-16 DIAGNOSIS — M7660 Achilles tendinitis, unspecified leg: Secondary | ICD-10-CM | POA: Diagnosis not present

## 2020-06-16 DIAGNOSIS — Z6824 Body mass index (BMI) 24.0-24.9, adult: Secondary | ICD-10-CM | POA: Diagnosis not present

## 2020-06-16 DIAGNOSIS — D649 Anemia, unspecified: Secondary | ICD-10-CM | POA: Diagnosis not present

## 2020-06-16 DIAGNOSIS — H9201 Otalgia, right ear: Secondary | ICD-10-CM | POA: Diagnosis not present

## 2020-06-16 DIAGNOSIS — E1140 Type 2 diabetes mellitus with diabetic neuropathy, unspecified: Secondary | ICD-10-CM | POA: Diagnosis not present

## 2020-06-19 DIAGNOSIS — R7989 Other specified abnormal findings of blood chemistry: Secondary | ICD-10-CM | POA: Diagnosis not present

## 2020-06-19 DIAGNOSIS — E119 Type 2 diabetes mellitus without complications: Secondary | ICD-10-CM | POA: Diagnosis not present

## 2020-06-19 DIAGNOSIS — S86011A Strain of right Achilles tendon, initial encounter: Secondary | ICD-10-CM | POA: Diagnosis not present

## 2020-06-19 DIAGNOSIS — B0229 Other postherpetic nervous system involvement: Secondary | ICD-10-CM | POA: Diagnosis not present

## 2020-06-19 DIAGNOSIS — M79671 Pain in right foot: Secondary | ICD-10-CM | POA: Diagnosis not present

## 2020-06-19 DIAGNOSIS — M19071 Primary osteoarthritis, right ankle and foot: Secondary | ICD-10-CM | POA: Diagnosis not present

## 2020-06-19 DIAGNOSIS — D469 Myelodysplastic syndrome, unspecified: Secondary | ICD-10-CM | POA: Diagnosis not present

## 2020-06-19 DIAGNOSIS — Z7189 Other specified counseling: Secondary | ICD-10-CM | POA: Diagnosis not present

## 2020-06-19 DIAGNOSIS — I75022 Atheroembolism of left lower extremity: Secondary | ICD-10-CM | POA: Diagnosis not present

## 2020-06-19 DIAGNOSIS — D500 Iron deficiency anemia secondary to blood loss (chronic): Secondary | ICD-10-CM | POA: Diagnosis not present

## 2020-06-19 DIAGNOSIS — M1120 Other chondrocalcinosis, unspecified site: Secondary | ICD-10-CM | POA: Diagnosis not present

## 2020-06-19 DIAGNOSIS — Z794 Long term (current) use of insulin: Secondary | ICD-10-CM | POA: Diagnosis not present

## 2020-06-19 DIAGNOSIS — Z6825 Body mass index (BMI) 25.0-25.9, adult: Secondary | ICD-10-CM | POA: Diagnosis not present

## 2020-06-19 DIAGNOSIS — E11621 Type 2 diabetes mellitus with foot ulcer: Secondary | ICD-10-CM | POA: Diagnosis not present

## 2020-06-19 DIAGNOSIS — M7660 Achilles tendinitis, unspecified leg: Secondary | ICD-10-CM | POA: Diagnosis not present

## 2020-06-20 DIAGNOSIS — S86001A Unspecified injury of right Achilles tendon, initial encounter: Secondary | ICD-10-CM | POA: Diagnosis not present

## 2020-06-26 DIAGNOSIS — D500 Iron deficiency anemia secondary to blood loss (chronic): Secondary | ICD-10-CM | POA: Diagnosis not present

## 2020-06-26 DIAGNOSIS — M79671 Pain in right foot: Secondary | ICD-10-CM | POA: Diagnosis not present

## 2020-06-26 DIAGNOSIS — D469 Myelodysplastic syndrome, unspecified: Secondary | ICD-10-CM | POA: Diagnosis not present

## 2020-06-26 DIAGNOSIS — I739 Peripheral vascular disease, unspecified: Secondary | ICD-10-CM | POA: Diagnosis not present

## 2020-06-26 DIAGNOSIS — D528 Other folate deficiency anemias: Secondary | ICD-10-CM | POA: Diagnosis not present

## 2020-06-26 DIAGNOSIS — M79672 Pain in left foot: Secondary | ICD-10-CM | POA: Diagnosis not present

## 2020-06-26 DIAGNOSIS — C61 Malignant neoplasm of prostate: Secondary | ICD-10-CM | POA: Diagnosis not present

## 2020-06-27 DIAGNOSIS — I739 Peripheral vascular disease, unspecified: Secondary | ICD-10-CM | POA: Diagnosis not present

## 2020-06-27 DIAGNOSIS — R238 Other skin changes: Secondary | ICD-10-CM | POA: Diagnosis not present

## 2020-06-27 DIAGNOSIS — Z8739 Personal history of other diseases of the musculoskeletal system and connective tissue: Secondary | ICD-10-CM | POA: Diagnosis not present

## 2020-07-05 DIAGNOSIS — I739 Peripheral vascular disease, unspecified: Secondary | ICD-10-CM | POA: Diagnosis not present

## 2020-07-05 DIAGNOSIS — S86011A Strain of right Achilles tendon, initial encounter: Secondary | ICD-10-CM | POA: Diagnosis not present

## 2020-07-05 DIAGNOSIS — B0229 Other postherpetic nervous system involvement: Secondary | ICD-10-CM | POA: Diagnosis not present

## 2020-07-05 DIAGNOSIS — G629 Polyneuropathy, unspecified: Secondary | ICD-10-CM | POA: Diagnosis not present

## 2020-07-05 DIAGNOSIS — E1140 Type 2 diabetes mellitus with diabetic neuropathy, unspecified: Secondary | ICD-10-CM | POA: Diagnosis not present

## 2020-07-05 DIAGNOSIS — D649 Anemia, unspecified: Secondary | ICD-10-CM | POA: Diagnosis not present

## 2020-07-05 DIAGNOSIS — Z6825 Body mass index (BMI) 25.0-25.9, adult: Secondary | ICD-10-CM | POA: Diagnosis not present

## 2020-07-06 DIAGNOSIS — S86011A Strain of right Achilles tendon, initial encounter: Secondary | ICD-10-CM | POA: Diagnosis not present

## 2020-07-17 DIAGNOSIS — L97521 Non-pressure chronic ulcer of other part of left foot limited to breakdown of skin: Secondary | ICD-10-CM | POA: Diagnosis not present

## 2020-07-17 DIAGNOSIS — I739 Peripheral vascular disease, unspecified: Secondary | ICD-10-CM | POA: Diagnosis not present

## 2020-07-17 DIAGNOSIS — R238 Other skin changes: Secondary | ICD-10-CM | POA: Diagnosis not present

## 2020-07-18 DIAGNOSIS — S86011D Strain of right Achilles tendon, subsequent encounter: Secondary | ICD-10-CM | POA: Diagnosis not present

## 2020-07-18 DIAGNOSIS — M79671 Pain in right foot: Secondary | ICD-10-CM | POA: Diagnosis not present

## 2020-07-18 DIAGNOSIS — R2681 Unsteadiness on feet: Secondary | ICD-10-CM | POA: Diagnosis not present

## 2020-07-18 DIAGNOSIS — M25571 Pain in right ankle and joints of right foot: Secondary | ICD-10-CM | POA: Diagnosis not present

## 2020-07-18 DIAGNOSIS — M25674 Stiffness of right foot, not elsewhere classified: Secondary | ICD-10-CM | POA: Diagnosis not present

## 2020-07-20 DIAGNOSIS — I771 Stricture of artery: Secondary | ICD-10-CM | POA: Diagnosis not present

## 2020-07-20 DIAGNOSIS — Z86718 Personal history of other venous thrombosis and embolism: Secondary | ICD-10-CM | POA: Diagnosis not present

## 2020-07-20 DIAGNOSIS — Z7952 Long term (current) use of systemic steroids: Secondary | ICD-10-CM | POA: Diagnosis not present

## 2020-07-20 DIAGNOSIS — L97521 Non-pressure chronic ulcer of other part of left foot limited to breakdown of skin: Secondary | ICD-10-CM | POA: Diagnosis not present

## 2020-07-20 DIAGNOSIS — I739 Peripheral vascular disease, unspecified: Secondary | ICD-10-CM | POA: Diagnosis not present

## 2020-07-20 DIAGNOSIS — Z91041 Radiographic dye allergy status: Secondary | ICD-10-CM | POA: Diagnosis not present

## 2020-07-20 DIAGNOSIS — I70245 Atherosclerosis of native arteries of left leg with ulceration of other part of foot: Secondary | ICD-10-CM | POA: Diagnosis not present

## 2020-07-24 DIAGNOSIS — D528 Other folate deficiency anemias: Secondary | ICD-10-CM | POA: Diagnosis not present

## 2020-07-24 DIAGNOSIS — B0229 Other postherpetic nervous system involvement: Secondary | ICD-10-CM | POA: Diagnosis not present

## 2020-07-24 DIAGNOSIS — D500 Iron deficiency anemia secondary to blood loss (chronic): Secondary | ICD-10-CM | POA: Diagnosis not present

## 2020-07-24 DIAGNOSIS — T380X5A Adverse effect of glucocorticoids and synthetic analogues, initial encounter: Secondary | ICD-10-CM | POA: Diagnosis not present

## 2020-07-24 DIAGNOSIS — M818 Other osteoporosis without current pathological fracture: Secondary | ICD-10-CM | POA: Diagnosis not present

## 2020-07-24 DIAGNOSIS — D469 Myelodysplastic syndrome, unspecified: Secondary | ICD-10-CM | POA: Diagnosis not present

## 2020-07-24 DIAGNOSIS — K633 Ulcer of intestine: Secondary | ICD-10-CM | POA: Diagnosis not present

## 2020-07-24 DIAGNOSIS — M1990 Unspecified osteoarthritis, unspecified site: Secondary | ICD-10-CM | POA: Diagnosis not present

## 2020-07-25 DIAGNOSIS — I739 Peripheral vascular disease, unspecified: Secondary | ICD-10-CM | POA: Diagnosis not present

## 2020-07-25 DIAGNOSIS — L97529 Non-pressure chronic ulcer of other part of left foot with unspecified severity: Secondary | ICD-10-CM | POA: Diagnosis not present

## 2020-07-25 DIAGNOSIS — I70209 Unspecified atherosclerosis of native arteries of extremities, unspecified extremity: Secondary | ICD-10-CM | POA: Diagnosis not present

## 2020-07-25 DIAGNOSIS — Z01810 Encounter for preprocedural cardiovascular examination: Secondary | ICD-10-CM | POA: Diagnosis not present

## 2020-07-25 DIAGNOSIS — E1151 Type 2 diabetes mellitus with diabetic peripheral angiopathy without gangrene: Secondary | ICD-10-CM | POA: Diagnosis not present

## 2020-07-27 DIAGNOSIS — M25571 Pain in right ankle and joints of right foot: Secondary | ICD-10-CM | POA: Diagnosis not present

## 2020-07-27 DIAGNOSIS — R2681 Unsteadiness on feet: Secondary | ICD-10-CM | POA: Diagnosis not present

## 2020-07-27 DIAGNOSIS — M79671 Pain in right foot: Secondary | ICD-10-CM | POA: Diagnosis not present

## 2020-07-27 DIAGNOSIS — S86011D Strain of right Achilles tendon, subsequent encounter: Secondary | ICD-10-CM | POA: Diagnosis not present

## 2020-07-27 DIAGNOSIS — M25674 Stiffness of right foot, not elsewhere classified: Secondary | ICD-10-CM | POA: Diagnosis not present

## 2020-07-28 DIAGNOSIS — S86011D Strain of right Achilles tendon, subsequent encounter: Secondary | ICD-10-CM | POA: Diagnosis not present

## 2020-07-28 DIAGNOSIS — M25674 Stiffness of right foot, not elsewhere classified: Secondary | ICD-10-CM | POA: Diagnosis not present

## 2020-07-28 DIAGNOSIS — M79671 Pain in right foot: Secondary | ICD-10-CM | POA: Diagnosis not present

## 2020-07-28 DIAGNOSIS — M25571 Pain in right ankle and joints of right foot: Secondary | ICD-10-CM | POA: Diagnosis not present

## 2020-07-28 DIAGNOSIS — R2681 Unsteadiness on feet: Secondary | ICD-10-CM | POA: Diagnosis not present

## 2020-07-31 DIAGNOSIS — L97521 Non-pressure chronic ulcer of other part of left foot limited to breakdown of skin: Secondary | ICD-10-CM | POA: Diagnosis not present

## 2020-07-31 DIAGNOSIS — I739 Peripheral vascular disease, unspecified: Secondary | ICD-10-CM | POA: Diagnosis not present

## 2020-08-01 DIAGNOSIS — D472 Monoclonal gammopathy: Secondary | ICD-10-CM | POA: Diagnosis not present

## 2020-08-01 DIAGNOSIS — R7989 Other specified abnormal findings of blood chemistry: Secondary | ICD-10-CM | POA: Diagnosis not present

## 2020-08-01 DIAGNOSIS — D500 Iron deficiency anemia secondary to blood loss (chronic): Secondary | ICD-10-CM | POA: Diagnosis not present

## 2020-08-01 DIAGNOSIS — M1120 Other chondrocalcinosis, unspecified site: Secondary | ICD-10-CM | POA: Diagnosis not present

## 2020-08-01 DIAGNOSIS — Z20822 Contact with and (suspected) exposure to covid-19: Secondary | ICD-10-CM | POA: Diagnosis not present

## 2020-08-01 DIAGNOSIS — I739 Peripheral vascular disease, unspecified: Secondary | ICD-10-CM | POA: Diagnosis not present

## 2020-08-01 DIAGNOSIS — D469 Myelodysplastic syndrome, unspecified: Secondary | ICD-10-CM | POA: Diagnosis not present

## 2020-08-01 DIAGNOSIS — Z1501 Genetic susceptibility to malignant neoplasm of breast: Secondary | ICD-10-CM | POA: Diagnosis not present

## 2020-08-01 DIAGNOSIS — I75022 Atheroembolism of left lower extremity: Secondary | ICD-10-CM | POA: Diagnosis not present

## 2020-08-01 DIAGNOSIS — B0229 Other postherpetic nervous system involvement: Secondary | ICD-10-CM | POA: Diagnosis not present

## 2020-08-02 DIAGNOSIS — Z79899 Other long term (current) drug therapy: Secondary | ICD-10-CM | POA: Diagnosis not present

## 2020-08-02 DIAGNOSIS — Z9079 Acquired absence of other genital organ(s): Secondary | ICD-10-CM | POA: Diagnosis not present

## 2020-08-02 DIAGNOSIS — I70222 Atherosclerosis of native arteries of extremities with rest pain, left leg: Secondary | ICD-10-CM | POA: Diagnosis not present

## 2020-08-02 DIAGNOSIS — Z20822 Contact with and (suspected) exposure to covid-19: Secondary | ICD-10-CM | POA: Diagnosis present

## 2020-08-02 DIAGNOSIS — Z86718 Personal history of other venous thrombosis and embolism: Secondary | ICD-10-CM | POA: Diagnosis not present

## 2020-08-02 DIAGNOSIS — Z01812 Encounter for preprocedural laboratory examination: Secondary | ICD-10-CM | POA: Diagnosis not present

## 2020-08-02 DIAGNOSIS — Z8546 Personal history of malignant neoplasm of prostate: Secondary | ICD-10-CM | POA: Diagnosis not present

## 2020-08-02 DIAGNOSIS — Z7982 Long term (current) use of aspirin: Secondary | ICD-10-CM | POA: Diagnosis not present

## 2020-08-02 DIAGNOSIS — I7090 Unspecified atherosclerosis: Secondary | ICD-10-CM | POA: Diagnosis not present

## 2020-08-02 DIAGNOSIS — I70262 Atherosclerosis of native arteries of extremities with gangrene, left leg: Secondary | ICD-10-CM | POA: Diagnosis present

## 2020-08-02 DIAGNOSIS — Z794 Long term (current) use of insulin: Secondary | ICD-10-CM | POA: Diagnosis not present

## 2020-08-02 DIAGNOSIS — Z85828 Personal history of other malignant neoplasm of skin: Secondary | ICD-10-CM | POA: Diagnosis not present

## 2020-08-02 DIAGNOSIS — L97521 Non-pressure chronic ulcer of other part of left foot limited to breakdown of skin: Secondary | ICD-10-CM | POA: Diagnosis present

## 2020-08-02 DIAGNOSIS — M11872 Other specified crystal arthropathies, left ankle and foot: Secondary | ICD-10-CM | POA: Diagnosis present

## 2020-08-02 DIAGNOSIS — M1180 Other specified crystal arthropathies, unspecified site: Secondary | ICD-10-CM | POA: Diagnosis not present

## 2020-08-02 DIAGNOSIS — I70245 Atherosclerosis of native arteries of left leg with ulceration of other part of foot: Secondary | ICD-10-CM | POA: Diagnosis not present

## 2020-08-02 DIAGNOSIS — M25532 Pain in left wrist: Secondary | ICD-10-CM | POA: Diagnosis not present

## 2020-08-02 DIAGNOSIS — I96 Gangrene, not elsewhere classified: Secondary | ICD-10-CM | POA: Diagnosis not present

## 2020-08-02 DIAGNOSIS — E1151 Type 2 diabetes mellitus with diabetic peripheral angiopathy without gangrene: Secondary | ICD-10-CM | POA: Diagnosis present

## 2020-08-02 DIAGNOSIS — Z96653 Presence of artificial knee joint, bilateral: Secondary | ICD-10-CM | POA: Diagnosis present

## 2020-08-02 DIAGNOSIS — I739 Peripheral vascular disease, unspecified: Secondary | ICD-10-CM | POA: Diagnosis not present

## 2020-08-08 DIAGNOSIS — S86011A Strain of right Achilles tendon, initial encounter: Secondary | ICD-10-CM | POA: Diagnosis not present

## 2020-08-10 DIAGNOSIS — Z7902 Long term (current) use of antithrombotics/antiplatelets: Secondary | ICD-10-CM | POA: Diagnosis not present

## 2020-08-10 DIAGNOSIS — Z794 Long term (current) use of insulin: Secondary | ICD-10-CM | POA: Diagnosis not present

## 2020-08-10 DIAGNOSIS — D649 Anemia, unspecified: Secondary | ICD-10-CM | POA: Diagnosis not present

## 2020-08-10 DIAGNOSIS — Z96653 Presence of artificial knee joint, bilateral: Secondary | ICD-10-CM | POA: Diagnosis not present

## 2020-08-10 DIAGNOSIS — E1140 Type 2 diabetes mellitus with diabetic neuropathy, unspecified: Secondary | ICD-10-CM | POA: Diagnosis not present

## 2020-08-10 DIAGNOSIS — Z7952 Long term (current) use of systemic steroids: Secondary | ICD-10-CM | POA: Diagnosis not present

## 2020-08-10 DIAGNOSIS — E1152 Type 2 diabetes mellitus with diabetic peripheral angiopathy with gangrene: Secondary | ICD-10-CM | POA: Diagnosis not present

## 2020-08-10 DIAGNOSIS — Z8546 Personal history of malignant neoplasm of prostate: Secondary | ICD-10-CM | POA: Diagnosis not present

## 2020-08-10 DIAGNOSIS — Z7982 Long term (current) use of aspirin: Secondary | ICD-10-CM | POA: Diagnosis not present

## 2020-08-10 DIAGNOSIS — Z86718 Personal history of other venous thrombosis and embolism: Secondary | ICD-10-CM | POA: Diagnosis not present

## 2020-08-10 DIAGNOSIS — L97529 Non-pressure chronic ulcer of other part of left foot with unspecified severity: Secondary | ICD-10-CM | POA: Diagnosis not present

## 2020-08-10 DIAGNOSIS — M069 Rheumatoid arthritis, unspecified: Secondary | ICD-10-CM | POA: Diagnosis not present

## 2020-08-10 DIAGNOSIS — Z48812 Encounter for surgical aftercare following surgery on the circulatory system: Secondary | ICD-10-CM | POA: Diagnosis not present

## 2020-08-15 DIAGNOSIS — G8918 Other acute postprocedural pain: Secondary | ICD-10-CM | POA: Diagnosis not present

## 2020-08-15 DIAGNOSIS — Z6826 Body mass index (BMI) 26.0-26.9, adult: Secondary | ICD-10-CM | POA: Diagnosis not present

## 2020-08-15 DIAGNOSIS — Z20822 Contact with and (suspected) exposure to covid-19: Secondary | ICD-10-CM | POA: Diagnosis not present

## 2020-08-15 DIAGNOSIS — Z95828 Presence of other vascular implants and grafts: Secondary | ICD-10-CM | POA: Diagnosis not present

## 2020-08-15 DIAGNOSIS — Z79899 Other long term (current) drug therapy: Secondary | ICD-10-CM | POA: Diagnosis not present

## 2020-08-15 DIAGNOSIS — Z7982 Long term (current) use of aspirin: Secondary | ICD-10-CM | POA: Diagnosis not present

## 2020-08-15 DIAGNOSIS — Z7952 Long term (current) use of systemic steroids: Secondary | ICD-10-CM | POA: Diagnosis not present

## 2020-08-15 DIAGNOSIS — Z95820 Peripheral vascular angioplasty status with implants and grafts: Secondary | ICD-10-CM | POA: Diagnosis not present

## 2020-08-15 DIAGNOSIS — M064 Inflammatory polyarthropathy: Secondary | ICD-10-CM | POA: Diagnosis not present

## 2020-08-15 DIAGNOSIS — Z7901 Long term (current) use of anticoagulants: Secondary | ICD-10-CM | POA: Diagnosis not present

## 2020-08-15 DIAGNOSIS — D649 Anemia, unspecified: Secondary | ICD-10-CM | POA: Diagnosis not present

## 2020-08-15 DIAGNOSIS — Z9181 History of falling: Secondary | ICD-10-CM | POA: Diagnosis not present

## 2020-08-15 DIAGNOSIS — M79605 Pain in left leg: Secondary | ICD-10-CM | POA: Diagnosis not present

## 2020-08-15 DIAGNOSIS — Z91041 Radiographic dye allergy status: Secondary | ICD-10-CM | POA: Diagnosis not present

## 2020-08-15 DIAGNOSIS — Z48812 Encounter for surgical aftercare following surgery on the circulatory system: Secondary | ICD-10-CM | POA: Diagnosis not present

## 2020-08-15 DIAGNOSIS — R0602 Shortness of breath: Secondary | ICD-10-CM | POA: Diagnosis not present

## 2020-08-15 DIAGNOSIS — R2689 Other abnormalities of gait and mobility: Secondary | ICD-10-CM | POA: Diagnosis not present

## 2020-08-15 DIAGNOSIS — M2560 Stiffness of unspecified joint, not elsewhere classified: Secondary | ICD-10-CM | POA: Diagnosis not present

## 2020-08-15 DIAGNOSIS — I739 Peripheral vascular disease, unspecified: Secondary | ICD-10-CM | POA: Diagnosis not present

## 2020-08-15 DIAGNOSIS — M79662 Pain in left lower leg: Secondary | ICD-10-CM | POA: Diagnosis not present

## 2020-08-15 DIAGNOSIS — Z794 Long term (current) use of insulin: Secondary | ICD-10-CM | POA: Diagnosis not present

## 2020-08-15 DIAGNOSIS — Z7409 Other reduced mobility: Secondary | ICD-10-CM | POA: Diagnosis not present

## 2020-08-15 DIAGNOSIS — D472 Monoclonal gammopathy: Secondary | ICD-10-CM | POA: Diagnosis not present

## 2020-08-15 DIAGNOSIS — M7989 Other specified soft tissue disorders: Secondary | ICD-10-CM | POA: Diagnosis not present

## 2020-08-15 DIAGNOSIS — D469 Myelodysplastic syndrome, unspecified: Secondary | ICD-10-CM | POA: Diagnosis not present

## 2020-08-15 DIAGNOSIS — E1151 Type 2 diabetes mellitus with diabetic peripheral angiopathy without gangrene: Secondary | ICD-10-CM | POA: Diagnosis not present

## 2020-08-15 DIAGNOSIS — R5383 Other fatigue: Secondary | ICD-10-CM | POA: Diagnosis not present

## 2020-08-15 DIAGNOSIS — B0229 Other postherpetic nervous system involvement: Secondary | ICD-10-CM | POA: Diagnosis not present

## 2020-08-15 DIAGNOSIS — R0600 Dyspnea, unspecified: Secondary | ICD-10-CM | POA: Diagnosis not present

## 2020-08-15 DIAGNOSIS — Z7902 Long term (current) use of antithrombotics/antiplatelets: Secondary | ICD-10-CM | POA: Diagnosis not present

## 2020-08-16 DIAGNOSIS — E119 Type 2 diabetes mellitus without complications: Secondary | ICD-10-CM | POA: Diagnosis not present

## 2020-08-16 DIAGNOSIS — D649 Anemia, unspecified: Secondary | ICD-10-CM | POA: Diagnosis not present

## 2020-08-16 DIAGNOSIS — Z86718 Personal history of other venous thrombosis and embolism: Secondary | ICD-10-CM | POA: Diagnosis not present

## 2020-08-16 DIAGNOSIS — R0602 Shortness of breath: Secondary | ICD-10-CM | POA: Diagnosis not present

## 2020-08-16 DIAGNOSIS — M79605 Pain in left leg: Secondary | ICD-10-CM | POA: Diagnosis not present

## 2020-08-17 DIAGNOSIS — D649 Anemia, unspecified: Secondary | ICD-10-CM | POA: Diagnosis not present

## 2020-08-17 DIAGNOSIS — M069 Rheumatoid arthritis, unspecified: Secondary | ICD-10-CM | POA: Diagnosis not present

## 2020-08-17 DIAGNOSIS — Z48812 Encounter for surgical aftercare following surgery on the circulatory system: Secondary | ICD-10-CM | POA: Diagnosis not present

## 2020-08-17 DIAGNOSIS — L97529 Non-pressure chronic ulcer of other part of left foot with unspecified severity: Secondary | ICD-10-CM | POA: Diagnosis not present

## 2020-08-17 DIAGNOSIS — E1152 Type 2 diabetes mellitus with diabetic peripheral angiopathy with gangrene: Secondary | ICD-10-CM | POA: Diagnosis not present

## 2020-08-17 DIAGNOSIS — E1140 Type 2 diabetes mellitus with diabetic neuropathy, unspecified: Secondary | ICD-10-CM | POA: Diagnosis not present

## 2020-08-18 DIAGNOSIS — L97521 Non-pressure chronic ulcer of other part of left foot limited to breakdown of skin: Secondary | ICD-10-CM | POA: Diagnosis not present

## 2020-08-18 DIAGNOSIS — I739 Peripheral vascular disease, unspecified: Secondary | ICD-10-CM | POA: Diagnosis not present

## 2020-08-18 DIAGNOSIS — Z09 Encounter for follow-up examination after completed treatment for conditions other than malignant neoplasm: Secondary | ICD-10-CM | POA: Diagnosis not present

## 2020-08-21 DIAGNOSIS — D469 Myelodysplastic syndrome, unspecified: Secondary | ICD-10-CM | POA: Diagnosis not present

## 2020-08-21 DIAGNOSIS — R7989 Other specified abnormal findings of blood chemistry: Secondary | ICD-10-CM | POA: Diagnosis not present

## 2020-08-21 DIAGNOSIS — D500 Iron deficiency anemia secondary to blood loss (chronic): Secondary | ICD-10-CM | POA: Diagnosis not present

## 2020-08-21 DIAGNOSIS — C61 Malignant neoplasm of prostate: Secondary | ICD-10-CM | POA: Diagnosis not present

## 2020-08-22 DIAGNOSIS — E1152 Type 2 diabetes mellitus with diabetic peripheral angiopathy with gangrene: Secondary | ICD-10-CM | POA: Diagnosis not present

## 2020-08-22 DIAGNOSIS — D649 Anemia, unspecified: Secondary | ICD-10-CM | POA: Diagnosis not present

## 2020-08-22 DIAGNOSIS — E1140 Type 2 diabetes mellitus with diabetic neuropathy, unspecified: Secondary | ICD-10-CM | POA: Diagnosis not present

## 2020-08-22 DIAGNOSIS — Z48812 Encounter for surgical aftercare following surgery on the circulatory system: Secondary | ICD-10-CM | POA: Diagnosis not present

## 2020-08-22 DIAGNOSIS — L97529 Non-pressure chronic ulcer of other part of left foot with unspecified severity: Secondary | ICD-10-CM | POA: Diagnosis not present

## 2020-08-22 DIAGNOSIS — M069 Rheumatoid arthritis, unspecified: Secondary | ICD-10-CM | POA: Diagnosis not present

## 2020-08-22 DIAGNOSIS — H9041 Sensorineural hearing loss, unilateral, right ear, with unrestricted hearing on the contralateral side: Secondary | ICD-10-CM | POA: Diagnosis not present

## 2020-08-23 DIAGNOSIS — L570 Actinic keratosis: Secondary | ICD-10-CM | POA: Diagnosis not present

## 2020-08-24 DIAGNOSIS — S86011A Strain of right Achilles tendon, initial encounter: Secondary | ICD-10-CM | POA: Diagnosis not present

## 2020-08-25 DIAGNOSIS — L97529 Non-pressure chronic ulcer of other part of left foot with unspecified severity: Secondary | ICD-10-CM | POA: Diagnosis not present

## 2020-08-25 DIAGNOSIS — D649 Anemia, unspecified: Secondary | ICD-10-CM | POA: Diagnosis not present

## 2020-08-25 DIAGNOSIS — Z48812 Encounter for surgical aftercare following surgery on the circulatory system: Secondary | ICD-10-CM | POA: Diagnosis not present

## 2020-08-25 DIAGNOSIS — E1140 Type 2 diabetes mellitus with diabetic neuropathy, unspecified: Secondary | ICD-10-CM | POA: Diagnosis not present

## 2020-08-25 DIAGNOSIS — M069 Rheumatoid arthritis, unspecified: Secondary | ICD-10-CM | POA: Diagnosis not present

## 2020-08-25 DIAGNOSIS — E1152 Type 2 diabetes mellitus with diabetic peripheral angiopathy with gangrene: Secondary | ICD-10-CM | POA: Diagnosis not present

## 2020-08-28 DIAGNOSIS — S86011D Strain of right Achilles tendon, subsequent encounter: Secondary | ICD-10-CM | POA: Diagnosis not present

## 2020-08-28 DIAGNOSIS — R2681 Unsteadiness on feet: Secondary | ICD-10-CM | POA: Diagnosis not present

## 2020-08-30 DIAGNOSIS — R2681 Unsteadiness on feet: Secondary | ICD-10-CM | POA: Diagnosis not present

## 2020-08-30 DIAGNOSIS — S86011D Strain of right Achilles tendon, subsequent encounter: Secondary | ICD-10-CM | POA: Diagnosis not present

## 2020-09-04 DIAGNOSIS — R2681 Unsteadiness on feet: Secondary | ICD-10-CM | POA: Diagnosis not present

## 2020-09-04 DIAGNOSIS — S86011D Strain of right Achilles tendon, subsequent encounter: Secondary | ICD-10-CM | POA: Diagnosis not present

## 2020-09-06 DIAGNOSIS — R2681 Unsteadiness on feet: Secondary | ICD-10-CM | POA: Diagnosis not present

## 2020-09-06 DIAGNOSIS — S86011D Strain of right Achilles tendon, subsequent encounter: Secondary | ICD-10-CM | POA: Diagnosis not present

## 2020-09-08 DIAGNOSIS — Z09 Encounter for follow-up examination after completed treatment for conditions other than malignant neoplasm: Secondary | ICD-10-CM | POA: Diagnosis not present

## 2020-09-08 DIAGNOSIS — L97521 Non-pressure chronic ulcer of other part of left foot limited to breakdown of skin: Secondary | ICD-10-CM | POA: Diagnosis not present

## 2020-09-08 DIAGNOSIS — I739 Peripheral vascular disease, unspecified: Secondary | ICD-10-CM | POA: Diagnosis not present

## 2020-09-11 DIAGNOSIS — S86011D Strain of right Achilles tendon, subsequent encounter: Secondary | ICD-10-CM | POA: Diagnosis not present

## 2020-09-11 DIAGNOSIS — R2681 Unsteadiness on feet: Secondary | ICD-10-CM | POA: Diagnosis not present

## 2020-09-13 DIAGNOSIS — R2681 Unsteadiness on feet: Secondary | ICD-10-CM | POA: Diagnosis not present

## 2020-09-13 DIAGNOSIS — S86011D Strain of right Achilles tendon, subsequent encounter: Secondary | ICD-10-CM | POA: Diagnosis not present

## 2020-09-15 DIAGNOSIS — D649 Anemia, unspecified: Secondary | ICD-10-CM | POA: Diagnosis not present

## 2020-09-15 DIAGNOSIS — Z20822 Contact with and (suspected) exposure to covid-19: Secondary | ICD-10-CM | POA: Diagnosis not present

## 2020-09-15 DIAGNOSIS — I739 Peripheral vascular disease, unspecified: Secondary | ICD-10-CM | POA: Diagnosis not present

## 2020-09-19 DIAGNOSIS — D528 Other folate deficiency anemias: Secondary | ICD-10-CM | POA: Diagnosis not present

## 2020-09-19 DIAGNOSIS — D469 Myelodysplastic syndrome, unspecified: Secondary | ICD-10-CM | POA: Diagnosis not present

## 2020-09-19 DIAGNOSIS — R7989 Other specified abnormal findings of blood chemistry: Secondary | ICD-10-CM | POA: Diagnosis not present

## 2020-09-19 DIAGNOSIS — D472 Monoclonal gammopathy: Secondary | ICD-10-CM | POA: Diagnosis not present

## 2020-09-19 DIAGNOSIS — M9270 Juvenile osteochondrosis of metatarsus, unspecified foot: Secondary | ICD-10-CM | POA: Diagnosis not present

## 2020-09-20 DIAGNOSIS — M25671 Stiffness of right ankle, not elsewhere classified: Secondary | ICD-10-CM | POA: Diagnosis not present

## 2020-09-20 DIAGNOSIS — R2681 Unsteadiness on feet: Secondary | ICD-10-CM | POA: Diagnosis not present

## 2020-09-20 DIAGNOSIS — M25571 Pain in right ankle and joints of right foot: Secondary | ICD-10-CM | POA: Diagnosis not present

## 2020-09-20 DIAGNOSIS — S86011D Strain of right Achilles tendon, subsequent encounter: Secondary | ICD-10-CM | POA: Diagnosis not present

## 2020-09-20 DIAGNOSIS — D469 Myelodysplastic syndrome, unspecified: Secondary | ICD-10-CM | POA: Diagnosis not present

## 2020-09-20 DIAGNOSIS — R29898 Other symptoms and signs involving the musculoskeletal system: Secondary | ICD-10-CM | POA: Diagnosis not present

## 2020-09-20 DIAGNOSIS — M25674 Stiffness of right foot, not elsewhere classified: Secondary | ICD-10-CM | POA: Diagnosis not present

## 2020-09-21 DIAGNOSIS — L97521 Non-pressure chronic ulcer of other part of left foot limited to breakdown of skin: Secondary | ICD-10-CM | POA: Diagnosis not present

## 2020-09-21 DIAGNOSIS — Z86718 Personal history of other venous thrombosis and embolism: Secondary | ICD-10-CM | POA: Diagnosis not present

## 2020-09-21 DIAGNOSIS — E1152 Type 2 diabetes mellitus with diabetic peripheral angiopathy with gangrene: Secondary | ICD-10-CM | POA: Diagnosis not present

## 2020-09-21 DIAGNOSIS — Z96653 Presence of artificial knee joint, bilateral: Secondary | ICD-10-CM | POA: Diagnosis not present

## 2020-09-21 DIAGNOSIS — M79672 Pain in left foot: Secondary | ICD-10-CM | POA: Diagnosis not present

## 2020-09-21 DIAGNOSIS — Z8546 Personal history of malignant neoplasm of prostate: Secondary | ICD-10-CM | POA: Diagnosis not present

## 2020-09-21 DIAGNOSIS — T380X5S Adverse effect of glucocorticoids and synthetic analogues, sequela: Secondary | ICD-10-CM | POA: Diagnosis not present

## 2020-09-21 DIAGNOSIS — I739 Peripheral vascular disease, unspecified: Secondary | ICD-10-CM | POA: Diagnosis not present

## 2020-09-22 DIAGNOSIS — M25571 Pain in right ankle and joints of right foot: Secondary | ICD-10-CM | POA: Diagnosis not present

## 2020-09-22 DIAGNOSIS — R2681 Unsteadiness on feet: Secondary | ICD-10-CM | POA: Diagnosis not present

## 2020-09-22 DIAGNOSIS — R29898 Other symptoms and signs involving the musculoskeletal system: Secondary | ICD-10-CM | POA: Diagnosis not present

## 2020-09-22 DIAGNOSIS — S86011D Strain of right Achilles tendon, subsequent encounter: Secondary | ICD-10-CM | POA: Diagnosis not present

## 2020-09-22 DIAGNOSIS — M25674 Stiffness of right foot, not elsewhere classified: Secondary | ICD-10-CM | POA: Diagnosis not present

## 2020-09-22 DIAGNOSIS — M25671 Stiffness of right ankle, not elsewhere classified: Secondary | ICD-10-CM | POA: Diagnosis not present

## 2020-09-25 DIAGNOSIS — M25671 Stiffness of right ankle, not elsewhere classified: Secondary | ICD-10-CM | POA: Diagnosis not present

## 2020-09-25 DIAGNOSIS — M25674 Stiffness of right foot, not elsewhere classified: Secondary | ICD-10-CM | POA: Diagnosis not present

## 2020-09-25 DIAGNOSIS — M25571 Pain in right ankle and joints of right foot: Secondary | ICD-10-CM | POA: Diagnosis not present

## 2020-09-25 DIAGNOSIS — R2681 Unsteadiness on feet: Secondary | ICD-10-CM | POA: Diagnosis not present

## 2020-09-25 DIAGNOSIS — R29898 Other symptoms and signs involving the musculoskeletal system: Secondary | ICD-10-CM | POA: Diagnosis not present

## 2020-09-25 DIAGNOSIS — S86011D Strain of right Achilles tendon, subsequent encounter: Secondary | ICD-10-CM | POA: Diagnosis not present

## 2020-09-28 DIAGNOSIS — R2681 Unsteadiness on feet: Secondary | ICD-10-CM | POA: Diagnosis not present

## 2020-09-28 DIAGNOSIS — R29898 Other symptoms and signs involving the musculoskeletal system: Secondary | ICD-10-CM | POA: Diagnosis not present

## 2020-09-28 DIAGNOSIS — S86011D Strain of right Achilles tendon, subsequent encounter: Secondary | ICD-10-CM | POA: Diagnosis not present

## 2020-09-28 DIAGNOSIS — M25674 Stiffness of right foot, not elsewhere classified: Secondary | ICD-10-CM | POA: Diagnosis not present

## 2020-09-28 DIAGNOSIS — M25571 Pain in right ankle and joints of right foot: Secondary | ICD-10-CM | POA: Diagnosis not present

## 2020-09-28 DIAGNOSIS — M25671 Stiffness of right ankle, not elsewhere classified: Secondary | ICD-10-CM | POA: Diagnosis not present

## 2020-10-03 DIAGNOSIS — G500 Trigeminal neuralgia: Secondary | ICD-10-CM | POA: Diagnosis not present

## 2020-10-03 DIAGNOSIS — B0221 Postherpetic geniculate ganglionitis: Secondary | ICD-10-CM | POA: Diagnosis not present

## 2020-10-03 DIAGNOSIS — B0229 Other postherpetic nervous system involvement: Secondary | ICD-10-CM | POA: Diagnosis not present

## 2020-10-03 DIAGNOSIS — G894 Chronic pain syndrome: Secondary | ICD-10-CM | POA: Diagnosis not present

## 2020-10-11 DIAGNOSIS — I998 Other disorder of circulatory system: Secondary | ICD-10-CM | POA: Diagnosis not present

## 2020-10-11 DIAGNOSIS — D520 Dietary folate deficiency anemia: Secondary | ICD-10-CM | POA: Diagnosis not present

## 2020-10-11 DIAGNOSIS — L539 Erythematous condition, unspecified: Secondary | ICD-10-CM | POA: Diagnosis not present

## 2020-10-11 DIAGNOSIS — M9272 Juvenile osteochondrosis of metatarsus, left foot: Secondary | ICD-10-CM | POA: Diagnosis not present

## 2020-10-11 DIAGNOSIS — M79675 Pain in left toe(s): Secondary | ICD-10-CM | POA: Diagnosis not present

## 2020-10-11 DIAGNOSIS — E1152 Type 2 diabetes mellitus with diabetic peripheral angiopathy with gangrene: Secondary | ICD-10-CM | POA: Diagnosis not present

## 2020-10-11 DIAGNOSIS — D469 Myelodysplastic syndrome, unspecified: Secondary | ICD-10-CM | POA: Diagnosis not present

## 2020-10-11 DIAGNOSIS — I739 Peripheral vascular disease, unspecified: Secondary | ICD-10-CM | POA: Diagnosis not present

## 2020-10-11 DIAGNOSIS — T380X5A Adverse effect of glucocorticoids and synthetic analogues, initial encounter: Secondary | ICD-10-CM | POA: Diagnosis not present

## 2020-10-11 DIAGNOSIS — M818 Other osteoporosis without current pathological fracture: Secondary | ICD-10-CM | POA: Diagnosis not present

## 2020-10-11 DIAGNOSIS — C61 Malignant neoplasm of prostate: Secondary | ICD-10-CM | POA: Diagnosis not present

## 2020-10-11 DIAGNOSIS — Z20822 Contact with and (suspected) exposure to covid-19: Secondary | ICD-10-CM | POA: Diagnosis not present

## 2020-10-11 DIAGNOSIS — I96 Gangrene, not elsewhere classified: Secondary | ICD-10-CM | POA: Diagnosis not present

## 2020-10-11 DIAGNOSIS — L97529 Non-pressure chronic ulcer of other part of left foot with unspecified severity: Secondary | ICD-10-CM | POA: Diagnosis not present

## 2020-10-11 DIAGNOSIS — E11621 Type 2 diabetes mellitus with foot ulcer: Secondary | ICD-10-CM | POA: Diagnosis not present

## 2020-10-11 DIAGNOSIS — D630 Anemia in neoplastic disease: Secondary | ICD-10-CM | POA: Diagnosis not present

## 2020-10-11 DIAGNOSIS — K219 Gastro-esophageal reflux disease without esophagitis: Secondary | ICD-10-CM | POA: Diagnosis not present

## 2020-10-12 DIAGNOSIS — D630 Anemia in neoplastic disease: Secondary | ICD-10-CM | POA: Diagnosis present

## 2020-10-12 DIAGNOSIS — Z86718 Personal history of other venous thrombosis and embolism: Secondary | ICD-10-CM | POA: Diagnosis not present

## 2020-10-12 DIAGNOSIS — I739 Peripheral vascular disease, unspecified: Secondary | ICD-10-CM | POA: Diagnosis present

## 2020-10-12 DIAGNOSIS — L539 Erythematous condition, unspecified: Secondary | ICD-10-CM | POA: Diagnosis not present

## 2020-10-12 DIAGNOSIS — S91102A Unspecified open wound of left great toe without damage to nail, initial encounter: Secondary | ICD-10-CM | POA: Diagnosis present

## 2020-10-12 DIAGNOSIS — L97529 Non-pressure chronic ulcer of other part of left foot with unspecified severity: Secondary | ICD-10-CM | POA: Diagnosis present

## 2020-10-12 DIAGNOSIS — M79672 Pain in left foot: Secondary | ICD-10-CM | POA: Diagnosis not present

## 2020-10-12 DIAGNOSIS — I96 Gangrene, not elsewhere classified: Secondary | ICD-10-CM | POA: Diagnosis not present

## 2020-10-12 DIAGNOSIS — L97521 Non-pressure chronic ulcer of other part of left foot limited to breakdown of skin: Secondary | ICD-10-CM | POA: Diagnosis not present

## 2020-10-12 DIAGNOSIS — E11621 Type 2 diabetes mellitus with foot ulcer: Secondary | ICD-10-CM | POA: Diagnosis present

## 2020-10-12 DIAGNOSIS — Z720 Tobacco use: Secondary | ICD-10-CM | POA: Diagnosis not present

## 2020-10-12 DIAGNOSIS — M19072 Primary osteoarthritis, left ankle and foot: Secondary | ICD-10-CM | POA: Diagnosis not present

## 2020-10-12 DIAGNOSIS — Z20822 Contact with and (suspected) exposure to covid-19: Secondary | ICD-10-CM | POA: Diagnosis not present

## 2020-10-12 DIAGNOSIS — Z89412 Acquired absence of left great toe: Secondary | ICD-10-CM | POA: Diagnosis not present

## 2020-10-12 DIAGNOSIS — M79675 Pain in left toe(s): Secondary | ICD-10-CM | POA: Diagnosis not present

## 2020-10-12 DIAGNOSIS — K219 Gastro-esophageal reflux disease without esophagitis: Secondary | ICD-10-CM | POA: Diagnosis present

## 2020-10-12 DIAGNOSIS — Z8546 Personal history of malignant neoplasm of prostate: Secondary | ICD-10-CM | POA: Diagnosis not present

## 2020-10-12 DIAGNOSIS — I998 Other disorder of circulatory system: Secondary | ICD-10-CM | POA: Diagnosis not present

## 2020-10-12 DIAGNOSIS — E1152 Type 2 diabetes mellitus with diabetic peripheral angiopathy with gangrene: Secondary | ICD-10-CM | POA: Diagnosis present

## 2020-10-13 DIAGNOSIS — I96 Gangrene, not elsewhere classified: Secondary | ICD-10-CM | POA: Diagnosis not present

## 2020-10-13 DIAGNOSIS — M19072 Primary osteoarthritis, left ankle and foot: Secondary | ICD-10-CM | POA: Diagnosis not present

## 2020-10-13 DIAGNOSIS — Z89412 Acquired absence of left great toe: Secondary | ICD-10-CM | POA: Diagnosis not present

## 2020-10-14 DIAGNOSIS — I96 Gangrene, not elsewhere classified: Secondary | ICD-10-CM | POA: Diagnosis not present

## 2020-10-16 DIAGNOSIS — L570 Actinic keratosis: Secondary | ICD-10-CM | POA: Diagnosis not present

## 2020-10-16 DIAGNOSIS — L0100 Impetigo, unspecified: Secondary | ICD-10-CM | POA: Diagnosis not present

## 2020-10-19 DIAGNOSIS — Z7189 Other specified counseling: Secondary | ICD-10-CM | POA: Diagnosis not present

## 2020-10-19 DIAGNOSIS — D469 Myelodysplastic syndrome, unspecified: Secondary | ICD-10-CM | POA: Diagnosis not present

## 2020-10-19 DIAGNOSIS — D500 Iron deficiency anemia secondary to blood loss (chronic): Secondary | ICD-10-CM | POA: Diagnosis not present

## 2020-10-24 DIAGNOSIS — I739 Peripheral vascular disease, unspecified: Secondary | ICD-10-CM | POA: Diagnosis not present

## 2020-10-24 DIAGNOSIS — Z9889 Other specified postprocedural states: Secondary | ICD-10-CM | POA: Diagnosis not present

## 2020-10-24 DIAGNOSIS — M79672 Pain in left foot: Secondary | ICD-10-CM | POA: Diagnosis not present

## 2020-11-06 DIAGNOSIS — D469 Myelodysplastic syndrome, unspecified: Secondary | ICD-10-CM | POA: Diagnosis not present

## 2020-11-06 DIAGNOSIS — C61 Malignant neoplasm of prostate: Secondary | ICD-10-CM | POA: Diagnosis not present

## 2020-11-07 DIAGNOSIS — L97521 Non-pressure chronic ulcer of other part of left foot limited to breakdown of skin: Secondary | ICD-10-CM | POA: Diagnosis not present

## 2020-11-07 DIAGNOSIS — I96 Gangrene, not elsewhere classified: Secondary | ICD-10-CM | POA: Diagnosis not present

## 2020-11-07 DIAGNOSIS — I739 Peripheral vascular disease, unspecified: Secondary | ICD-10-CM | POA: Diagnosis not present

## 2020-11-09 DIAGNOSIS — C44329 Squamous cell carcinoma of skin of other parts of face: Secondary | ICD-10-CM | POA: Diagnosis not present

## 2020-11-09 DIAGNOSIS — D469 Myelodysplastic syndrome, unspecified: Secondary | ICD-10-CM | POA: Diagnosis not present

## 2020-11-09 DIAGNOSIS — D485 Neoplasm of uncertain behavior of skin: Secondary | ICD-10-CM | POA: Diagnosis not present

## 2020-11-10 DIAGNOSIS — E1151 Type 2 diabetes mellitus with diabetic peripheral angiopathy without gangrene: Secondary | ICD-10-CM | POA: Diagnosis not present

## 2020-11-10 DIAGNOSIS — Z89412 Acquired absence of left great toe: Secondary | ICD-10-CM | POA: Diagnosis not present

## 2020-11-10 DIAGNOSIS — L97521 Non-pressure chronic ulcer of other part of left foot limited to breakdown of skin: Secondary | ICD-10-CM | POA: Diagnosis not present

## 2020-11-10 DIAGNOSIS — Z7982 Long term (current) use of aspirin: Secondary | ICD-10-CM | POA: Diagnosis not present

## 2020-11-10 DIAGNOSIS — Z7902 Long term (current) use of antithrombotics/antiplatelets: Secondary | ICD-10-CM | POA: Diagnosis not present

## 2020-11-10 DIAGNOSIS — Z95828 Presence of other vascular implants and grafts: Secondary | ICD-10-CM | POA: Diagnosis not present

## 2020-11-10 DIAGNOSIS — I739 Peripheral vascular disease, unspecified: Secondary | ICD-10-CM | POA: Diagnosis not present

## 2020-11-10 DIAGNOSIS — Z79899 Other long term (current) drug therapy: Secondary | ICD-10-CM | POA: Diagnosis not present

## 2020-11-21 DIAGNOSIS — L600 Ingrowing nail: Secondary | ICD-10-CM | POA: Diagnosis not present

## 2020-11-21 DIAGNOSIS — E0951 Drug or chemical induced diabetes mellitus with diabetic peripheral angiopathy without gangrene: Secondary | ICD-10-CM | POA: Diagnosis not present

## 2020-11-21 DIAGNOSIS — Z91041 Radiographic dye allergy status: Secondary | ICD-10-CM | POA: Diagnosis not present

## 2020-11-21 DIAGNOSIS — M1180 Other specified crystal arthropathies, unspecified site: Secondary | ICD-10-CM | POA: Diagnosis not present

## 2020-11-21 DIAGNOSIS — Z7982 Long term (current) use of aspirin: Secondary | ICD-10-CM | POA: Diagnosis not present

## 2020-11-21 DIAGNOSIS — D649 Anemia, unspecified: Secondary | ICD-10-CM | POA: Diagnosis not present

## 2020-11-21 DIAGNOSIS — Z794 Long term (current) use of insulin: Secondary | ICD-10-CM | POA: Diagnosis not present

## 2020-11-21 DIAGNOSIS — I739 Peripheral vascular disease, unspecified: Secondary | ICD-10-CM | POA: Diagnosis not present

## 2020-11-21 DIAGNOSIS — L03116 Cellulitis of left lower limb: Secondary | ICD-10-CM | POA: Diagnosis not present

## 2020-11-21 DIAGNOSIS — S91102A Unspecified open wound of left great toe without damage to nail, initial encounter: Secondary | ICD-10-CM | POA: Diagnosis not present

## 2020-11-21 DIAGNOSIS — M1120 Other chondrocalcinosis, unspecified site: Secondary | ICD-10-CM | POA: Diagnosis not present

## 2020-11-21 DIAGNOSIS — E119 Type 2 diabetes mellitus without complications: Secondary | ICD-10-CM | POA: Diagnosis not present

## 2020-11-21 DIAGNOSIS — M1710 Unilateral primary osteoarthritis, unspecified knee: Secondary | ICD-10-CM | POA: Diagnosis not present

## 2020-11-21 DIAGNOSIS — K219 Gastro-esophageal reflux disease without esophagitis: Secondary | ICD-10-CM | POA: Diagnosis not present

## 2020-11-21 DIAGNOSIS — Z96653 Presence of artificial knee joint, bilateral: Secondary | ICD-10-CM | POA: Diagnosis not present

## 2020-11-21 DIAGNOSIS — K633 Ulcer of intestine: Secondary | ICD-10-CM | POA: Diagnosis not present

## 2020-11-21 DIAGNOSIS — Z20822 Contact with and (suspected) exposure to covid-19: Secondary | ICD-10-CM | POA: Diagnosis not present

## 2020-11-21 DIAGNOSIS — T8131XA Disruption of external operation (surgical) wound, not elsewhere classified, initial encounter: Secondary | ICD-10-CM | POA: Diagnosis not present

## 2020-11-21 DIAGNOSIS — Z95820 Peripheral vascular angioplasty status with implants and grafts: Secondary | ICD-10-CM | POA: Diagnosis not present

## 2020-11-21 DIAGNOSIS — Z888 Allergy status to other drugs, medicaments and biological substances status: Secondary | ICD-10-CM | POA: Diagnosis not present

## 2020-11-21 DIAGNOSIS — D469 Myelodysplastic syndrome, unspecified: Secondary | ICD-10-CM | POA: Diagnosis not present

## 2020-11-21 DIAGNOSIS — M79672 Pain in left foot: Secondary | ICD-10-CM | POA: Diagnosis not present

## 2020-11-21 DIAGNOSIS — Z85828 Personal history of other malignant neoplasm of skin: Secondary | ICD-10-CM | POA: Diagnosis not present

## 2020-11-21 DIAGNOSIS — Z79899 Other long term (current) drug therapy: Secondary | ICD-10-CM | POA: Diagnosis not present

## 2020-11-21 DIAGNOSIS — T380X5S Adverse effect of glucocorticoids and synthetic analogues, sequela: Secondary | ICD-10-CM | POA: Diagnosis not present

## 2020-11-21 DIAGNOSIS — Z89412 Acquired absence of left great toe: Secondary | ICD-10-CM | POA: Diagnosis not present

## 2020-11-21 DIAGNOSIS — Z86718 Personal history of other venous thrombosis and embolism: Secondary | ICD-10-CM | POA: Diagnosis not present

## 2020-11-21 DIAGNOSIS — R194 Change in bowel habit: Secondary | ICD-10-CM | POA: Diagnosis not present

## 2020-11-21 DIAGNOSIS — B0229 Other postherpetic nervous system involvement: Secondary | ICD-10-CM | POA: Diagnosis not present

## 2020-11-24 DIAGNOSIS — Z91041 Radiographic dye allergy status: Secondary | ICD-10-CM | POA: Diagnosis not present

## 2020-11-24 DIAGNOSIS — Z794 Long term (current) use of insulin: Secondary | ICD-10-CM | POA: Diagnosis not present

## 2020-11-24 DIAGNOSIS — D649 Anemia, unspecified: Secondary | ICD-10-CM | POA: Diagnosis not present

## 2020-11-24 DIAGNOSIS — Z9109 Other allergy status, other than to drugs and biological substances: Secondary | ICD-10-CM | POA: Diagnosis not present

## 2020-11-24 DIAGNOSIS — Z89422 Acquired absence of other left toe(s): Secondary | ICD-10-CM | POA: Diagnosis not present

## 2020-11-24 DIAGNOSIS — Z8546 Personal history of malignant neoplasm of prostate: Secondary | ICD-10-CM | POA: Diagnosis not present

## 2020-11-24 DIAGNOSIS — L600 Ingrowing nail: Secondary | ICD-10-CM | POA: Diagnosis not present

## 2020-11-24 DIAGNOSIS — Z8673 Personal history of transient ischemic attack (TIA), and cerebral infarction without residual deficits: Secondary | ICD-10-CM | POA: Diagnosis not present

## 2020-11-24 DIAGNOSIS — T8132XD Disruption of internal operation (surgical) wound, not elsewhere classified, subsequent encounter: Secondary | ICD-10-CM | POA: Diagnosis not present

## 2020-11-24 DIAGNOSIS — E1152 Type 2 diabetes mellitus with diabetic peripheral angiopathy with gangrene: Secondary | ICD-10-CM | POA: Diagnosis not present

## 2020-11-24 DIAGNOSIS — E1169 Type 2 diabetes mellitus with other specified complication: Secondary | ICD-10-CM | POA: Diagnosis not present

## 2020-11-24 DIAGNOSIS — M86671 Other chronic osteomyelitis, right ankle and foot: Secondary | ICD-10-CM | POA: Diagnosis not present

## 2020-11-24 DIAGNOSIS — I739 Peripheral vascular disease, unspecified: Secondary | ICD-10-CM | POA: Diagnosis not present

## 2020-11-24 DIAGNOSIS — K219 Gastro-esophageal reflux disease without esophagitis: Secondary | ICD-10-CM | POA: Diagnosis not present

## 2020-11-24 DIAGNOSIS — M9689 Other intraoperative and postprocedural complications and disorders of the musculoskeletal system: Secondary | ICD-10-CM | POA: Diagnosis not present

## 2020-11-24 DIAGNOSIS — M869 Osteomyelitis, unspecified: Secondary | ICD-10-CM | POA: Diagnosis not present

## 2020-11-24 DIAGNOSIS — Z9889 Other specified postprocedural states: Secondary | ICD-10-CM | POA: Diagnosis not present

## 2020-11-24 DIAGNOSIS — Z89412 Acquired absence of left great toe: Secondary | ICD-10-CM | POA: Diagnosis not present

## 2020-11-24 DIAGNOSIS — M86672 Other chronic osteomyelitis, left ankle and foot: Secondary | ICD-10-CM | POA: Diagnosis not present

## 2020-11-29 DIAGNOSIS — C61 Malignant neoplasm of prostate: Secondary | ICD-10-CM | POA: Diagnosis not present

## 2020-11-29 DIAGNOSIS — D500 Iron deficiency anemia secondary to blood loss (chronic): Secondary | ICD-10-CM | POA: Diagnosis not present

## 2020-11-29 DIAGNOSIS — I739 Peripheral vascular disease, unspecified: Secondary | ICD-10-CM | POA: Diagnosis not present

## 2020-11-29 DIAGNOSIS — Z1501 Genetic susceptibility to malignant neoplasm of breast: Secondary | ICD-10-CM | POA: Diagnosis not present

## 2020-11-29 DIAGNOSIS — R7989 Other specified abnormal findings of blood chemistry: Secondary | ICD-10-CM | POA: Diagnosis not present

## 2020-11-29 DIAGNOSIS — M1990 Unspecified osteoarthritis, unspecified site: Secondary | ICD-10-CM | POA: Diagnosis not present

## 2020-11-29 DIAGNOSIS — M9272 Juvenile osteochondrosis of metatarsus, left foot: Secondary | ICD-10-CM | POA: Diagnosis not present

## 2020-11-29 DIAGNOSIS — D469 Myelodysplastic syndrome, unspecified: Secondary | ICD-10-CM | POA: Diagnosis not present

## 2020-12-04 DIAGNOSIS — D469 Myelodysplastic syndrome, unspecified: Secondary | ICD-10-CM | POA: Diagnosis not present

## 2020-12-07 DIAGNOSIS — M869 Osteomyelitis, unspecified: Secondary | ICD-10-CM | POA: Diagnosis not present

## 2020-12-07 DIAGNOSIS — Z9889 Other specified postprocedural states: Secondary | ICD-10-CM | POA: Diagnosis not present

## 2020-12-07 DIAGNOSIS — I739 Peripheral vascular disease, unspecified: Secondary | ICD-10-CM | POA: Diagnosis not present

## 2020-12-07 DIAGNOSIS — L600 Ingrowing nail: Secondary | ICD-10-CM | POA: Diagnosis not present

## 2020-12-07 DIAGNOSIS — M79672 Pain in left foot: Secondary | ICD-10-CM | POA: Diagnosis not present

## 2020-12-07 DIAGNOSIS — T380X5S Adverse effect of glucocorticoids and synthetic analogues, sequela: Secondary | ICD-10-CM | POA: Diagnosis not present

## 2020-12-13 DIAGNOSIS — M1120 Other chondrocalcinosis, unspecified site: Secondary | ICD-10-CM | POA: Diagnosis not present

## 2020-12-13 DIAGNOSIS — E1140 Type 2 diabetes mellitus with diabetic neuropathy, unspecified: Secondary | ICD-10-CM | POA: Diagnosis not present

## 2020-12-13 DIAGNOSIS — D649 Anemia, unspecified: Secondary | ICD-10-CM | POA: Diagnosis not present

## 2020-12-13 DIAGNOSIS — Z6826 Body mass index (BMI) 26.0-26.9, adult: Secondary | ICD-10-CM | POA: Diagnosis not present

## 2020-12-13 DIAGNOSIS — B0229 Other postherpetic nervous system involvement: Secondary | ICD-10-CM | POA: Diagnosis not present

## 2020-12-13 DIAGNOSIS — G629 Polyneuropathy, unspecified: Secondary | ICD-10-CM | POA: Diagnosis not present

## 2020-12-13 DIAGNOSIS — I739 Peripheral vascular disease, unspecified: Secondary | ICD-10-CM | POA: Diagnosis not present

## 2020-12-13 DIAGNOSIS — S86011A Strain of right Achilles tendon, initial encounter: Secondary | ICD-10-CM | POA: Diagnosis not present

## 2020-12-14 DIAGNOSIS — S86011A Strain of right Achilles tendon, initial encounter: Secondary | ICD-10-CM | POA: Diagnosis not present

## 2020-12-14 DIAGNOSIS — C44329 Squamous cell carcinoma of skin of other parts of face: Secondary | ICD-10-CM | POA: Diagnosis not present

## 2020-12-20 DIAGNOSIS — Z23 Encounter for immunization: Secondary | ICD-10-CM | POA: Diagnosis not present

## 2020-12-21 DIAGNOSIS — L600 Ingrowing nail: Secondary | ICD-10-CM | POA: Diagnosis not present

## 2020-12-21 DIAGNOSIS — Z85828 Personal history of other malignant neoplasm of skin: Secondary | ICD-10-CM | POA: Diagnosis not present

## 2020-12-21 DIAGNOSIS — Z95820 Peripheral vascular angioplasty status with implants and grafts: Secondary | ICD-10-CM | POA: Diagnosis not present

## 2020-12-21 DIAGNOSIS — Z86718 Personal history of other venous thrombosis and embolism: Secondary | ICD-10-CM | POA: Diagnosis not present

## 2020-12-21 DIAGNOSIS — Z981 Arthrodesis status: Secondary | ICD-10-CM | POA: Diagnosis not present

## 2020-12-21 DIAGNOSIS — Z79899 Other long term (current) drug therapy: Secondary | ICD-10-CM | POA: Diagnosis not present

## 2020-12-21 DIAGNOSIS — E1151 Type 2 diabetes mellitus with diabetic peripheral angiopathy without gangrene: Secondary | ICD-10-CM | POA: Diagnosis not present

## 2020-12-21 DIAGNOSIS — M79672 Pain in left foot: Secondary | ICD-10-CM | POA: Diagnosis not present

## 2020-12-21 DIAGNOSIS — I739 Peripheral vascular disease, unspecified: Secondary | ICD-10-CM | POA: Diagnosis not present

## 2020-12-21 DIAGNOSIS — Z89412 Acquired absence of left great toe: Secondary | ICD-10-CM | POA: Diagnosis not present

## 2020-12-21 DIAGNOSIS — Z8546 Personal history of malignant neoplasm of prostate: Secondary | ICD-10-CM | POA: Diagnosis not present

## 2020-12-21 DIAGNOSIS — Z9889 Other specified postprocedural states: Secondary | ICD-10-CM | POA: Diagnosis not present

## 2020-12-22 DIAGNOSIS — R29898 Other symptoms and signs involving the musculoskeletal system: Secondary | ICD-10-CM | POA: Diagnosis not present

## 2020-12-22 DIAGNOSIS — R2681 Unsteadiness on feet: Secondary | ICD-10-CM | POA: Diagnosis not present

## 2020-12-22 DIAGNOSIS — S86011A Strain of right Achilles tendon, initial encounter: Secondary | ICD-10-CM | POA: Diagnosis not present

## 2020-12-22 DIAGNOSIS — D500 Iron deficiency anemia secondary to blood loss (chronic): Secondary | ICD-10-CM | POA: Diagnosis not present

## 2020-12-22 DIAGNOSIS — M25671 Stiffness of right ankle, not elsewhere classified: Secondary | ICD-10-CM | POA: Diagnosis not present

## 2020-12-22 DIAGNOSIS — D469 Myelodysplastic syndrome, unspecified: Secondary | ICD-10-CM | POA: Diagnosis not present

## 2020-12-22 DIAGNOSIS — I739 Peripheral vascular disease, unspecified: Secondary | ICD-10-CM | POA: Diagnosis not present

## 2020-12-22 DIAGNOSIS — Z9889 Other specified postprocedural states: Secondary | ICD-10-CM | POA: Diagnosis not present

## 2020-12-22 DIAGNOSIS — M79672 Pain in left foot: Secondary | ICD-10-CM | POA: Diagnosis not present

## 2020-12-22 DIAGNOSIS — M25571 Pain in right ankle and joints of right foot: Secondary | ICD-10-CM | POA: Diagnosis not present

## 2020-12-25 DIAGNOSIS — D469 Myelodysplastic syndrome, unspecified: Secondary | ICD-10-CM | POA: Diagnosis not present

## 2020-12-28 DIAGNOSIS — R2681 Unsteadiness on feet: Secondary | ICD-10-CM | POA: Diagnosis not present

## 2020-12-28 DIAGNOSIS — M25571 Pain in right ankle and joints of right foot: Secondary | ICD-10-CM | POA: Diagnosis not present

## 2020-12-28 DIAGNOSIS — S86011A Strain of right Achilles tendon, initial encounter: Secondary | ICD-10-CM | POA: Diagnosis not present

## 2020-12-28 DIAGNOSIS — M25671 Stiffness of right ankle, not elsewhere classified: Secondary | ICD-10-CM | POA: Diagnosis not present

## 2020-12-28 DIAGNOSIS — R29898 Other symptoms and signs involving the musculoskeletal system: Secondary | ICD-10-CM | POA: Diagnosis not present

## 2021-01-01 DIAGNOSIS — S86011A Strain of right Achilles tendon, initial encounter: Secondary | ICD-10-CM | POA: Diagnosis not present

## 2021-01-01 DIAGNOSIS — R29898 Other symptoms and signs involving the musculoskeletal system: Secondary | ICD-10-CM | POA: Diagnosis not present

## 2021-01-01 DIAGNOSIS — M25571 Pain in right ankle and joints of right foot: Secondary | ICD-10-CM | POA: Diagnosis not present

## 2021-01-01 DIAGNOSIS — M25671 Stiffness of right ankle, not elsewhere classified: Secondary | ICD-10-CM | POA: Diagnosis not present

## 2021-01-01 DIAGNOSIS — R2681 Unsteadiness on feet: Secondary | ICD-10-CM | POA: Diagnosis not present

## 2021-01-02 DIAGNOSIS — S98112A Complete traumatic amputation of left great toe, initial encounter: Secondary | ICD-10-CM | POA: Diagnosis not present

## 2021-01-02 DIAGNOSIS — B0229 Other postherpetic nervous system involvement: Secondary | ICD-10-CM | POA: Diagnosis not present

## 2021-01-02 DIAGNOSIS — Z794 Long term (current) use of insulin: Secondary | ICD-10-CM | POA: Diagnosis not present

## 2021-01-02 DIAGNOSIS — Z7952 Long term (current) use of systemic steroids: Secondary | ICD-10-CM | POA: Diagnosis not present

## 2021-01-02 DIAGNOSIS — Z8739 Personal history of other diseases of the musculoskeletal system and connective tissue: Secondary | ICD-10-CM | POA: Diagnosis not present

## 2021-01-02 DIAGNOSIS — L03115 Cellulitis of right lower limb: Secondary | ICD-10-CM | POA: Diagnosis not present

## 2021-01-02 DIAGNOSIS — E119 Type 2 diabetes mellitus without complications: Secondary | ICD-10-CM | POA: Diagnosis not present

## 2021-01-02 DIAGNOSIS — Z89411 Acquired absence of right great toe: Secondary | ICD-10-CM | POA: Diagnosis not present

## 2021-01-02 DIAGNOSIS — L600 Ingrowing nail: Secondary | ICD-10-CM | POA: Diagnosis not present

## 2021-01-02 DIAGNOSIS — M179 Osteoarthritis of knee, unspecified: Secondary | ICD-10-CM | POA: Diagnosis not present

## 2021-01-11 DIAGNOSIS — D469 Myelodysplastic syndrome, unspecified: Secondary | ICD-10-CM | POA: Diagnosis not present

## 2021-01-12 DIAGNOSIS — S91102A Unspecified open wound of left great toe without damage to nail, initial encounter: Secondary | ICD-10-CM | POA: Diagnosis not present

## 2021-01-12 DIAGNOSIS — I739 Peripheral vascular disease, unspecified: Secondary | ICD-10-CM | POA: Diagnosis not present

## 2021-01-12 DIAGNOSIS — Z89412 Acquired absence of left great toe: Secondary | ICD-10-CM | POA: Diagnosis not present

## 2021-01-12 DIAGNOSIS — Z87891 Personal history of nicotine dependence: Secondary | ICD-10-CM | POA: Diagnosis not present

## 2021-01-12 DIAGNOSIS — S91101A Unspecified open wound of right great toe without damage to nail, initial encounter: Secondary | ICD-10-CM | POA: Diagnosis not present

## 2021-01-12 DIAGNOSIS — E1151 Type 2 diabetes mellitus with diabetic peripheral angiopathy without gangrene: Secondary | ICD-10-CM | POA: Diagnosis not present

## 2021-01-15 DIAGNOSIS — D61818 Other pancytopenia: Secondary | ICD-10-CM | POA: Diagnosis not present

## 2021-01-15 DIAGNOSIS — D469 Myelodysplastic syndrome, unspecified: Secondary | ICD-10-CM | POA: Diagnosis not present

## 2021-01-15 DIAGNOSIS — Z79899 Other long term (current) drug therapy: Secondary | ICD-10-CM | POA: Diagnosis not present

## 2021-01-15 DIAGNOSIS — D500 Iron deficiency anemia secondary to blood loss (chronic): Secondary | ICD-10-CM | POA: Diagnosis not present

## 2021-01-16 DIAGNOSIS — E119 Type 2 diabetes mellitus without complications: Secondary | ICD-10-CM | POA: Diagnosis not present

## 2021-01-16 DIAGNOSIS — M79672 Pain in left foot: Secondary | ICD-10-CM | POA: Diagnosis not present

## 2021-01-16 DIAGNOSIS — I739 Peripheral vascular disease, unspecified: Secondary | ICD-10-CM | POA: Diagnosis not present

## 2021-01-16 DIAGNOSIS — Z9889 Other specified postprocedural states: Secondary | ICD-10-CM | POA: Diagnosis not present

## 2021-01-16 DIAGNOSIS — S98112A Complete traumatic amputation of left great toe, initial encounter: Secondary | ICD-10-CM | POA: Diagnosis not present

## 2021-01-16 DIAGNOSIS — L600 Ingrowing nail: Secondary | ICD-10-CM | POA: Diagnosis not present

## 2021-01-16 DIAGNOSIS — L03115 Cellulitis of right lower limb: Secondary | ICD-10-CM | POA: Diagnosis not present

## 2021-01-16 DIAGNOSIS — Z794 Long term (current) use of insulin: Secondary | ICD-10-CM | POA: Diagnosis not present

## 2021-01-16 DIAGNOSIS — Z89412 Acquired absence of left great toe: Secondary | ICD-10-CM | POA: Diagnosis not present

## 2021-01-22 DIAGNOSIS — Z86718 Personal history of other venous thrombosis and embolism: Secondary | ICD-10-CM | POA: Diagnosis not present

## 2021-01-22 DIAGNOSIS — Z888 Allergy status to other drugs, medicaments and biological substances status: Secondary | ICD-10-CM | POA: Diagnosis not present

## 2021-01-22 DIAGNOSIS — Z794 Long term (current) use of insulin: Secondary | ICD-10-CM | POA: Diagnosis not present

## 2021-01-22 DIAGNOSIS — D509 Iron deficiency anemia, unspecified: Secondary | ICD-10-CM | POA: Diagnosis not present

## 2021-01-22 DIAGNOSIS — K219 Gastro-esophageal reflux disease without esophagitis: Secondary | ICD-10-CM | POA: Diagnosis not present

## 2021-01-22 DIAGNOSIS — K5730 Diverticulosis of large intestine without perforation or abscess without bleeding: Secondary | ICD-10-CM | POA: Diagnosis not present

## 2021-01-22 DIAGNOSIS — Z79899 Other long term (current) drug therapy: Secondary | ICD-10-CM | POA: Diagnosis not present

## 2021-01-22 DIAGNOSIS — Z7982 Long term (current) use of aspirin: Secondary | ICD-10-CM | POA: Diagnosis not present

## 2021-01-22 DIAGNOSIS — Z91041 Radiographic dye allergy status: Secondary | ICD-10-CM | POA: Diagnosis not present

## 2021-01-22 DIAGNOSIS — E119 Type 2 diabetes mellitus without complications: Secondary | ICD-10-CM | POA: Diagnosis not present

## 2021-01-22 DIAGNOSIS — Z8719 Personal history of other diseases of the digestive system: Secondary | ICD-10-CM | POA: Diagnosis not present

## 2021-01-29 DIAGNOSIS — R9431 Abnormal electrocardiogram [ECG] [EKG]: Secondary | ICD-10-CM | POA: Diagnosis not present

## 2021-01-29 DIAGNOSIS — D61818 Other pancytopenia: Secondary | ICD-10-CM | POA: Diagnosis not present

## 2021-01-29 DIAGNOSIS — I63233 Cerebral infarction due to unspecified occlusion or stenosis of bilateral carotid arteries: Secondary | ICD-10-CM | POA: Diagnosis not present

## 2021-01-29 DIAGNOSIS — I6523 Occlusion and stenosis of bilateral carotid arteries: Secondary | ICD-10-CM | POA: Diagnosis not present

## 2021-01-29 DIAGNOSIS — Z79899 Other long term (current) drug therapy: Secondary | ICD-10-CM | POA: Diagnosis not present

## 2021-01-29 DIAGNOSIS — D469 Myelodysplastic syndrome, unspecified: Secondary | ICD-10-CM | POA: Diagnosis not present

## 2021-01-29 DIAGNOSIS — Z20822 Contact with and (suspected) exposure to covid-19: Secondary | ICD-10-CM | POA: Diagnosis not present

## 2021-01-29 DIAGNOSIS — M79672 Pain in left foot: Secondary | ICD-10-CM | POA: Diagnosis not present

## 2021-01-29 DIAGNOSIS — R9082 White matter disease, unspecified: Secondary | ICD-10-CM | POA: Diagnosis not present

## 2021-01-29 DIAGNOSIS — I6381 Other cerebral infarction due to occlusion or stenosis of small artery: Secondary | ICD-10-CM | POA: Diagnosis not present

## 2021-01-29 DIAGNOSIS — C61 Malignant neoplasm of prostate: Secondary | ICD-10-CM | POA: Diagnosis not present

## 2021-01-29 DIAGNOSIS — Z7409 Other reduced mobility: Secondary | ICD-10-CM | POA: Diagnosis not present

## 2021-01-29 DIAGNOSIS — I672 Cerebral atherosclerosis: Secondary | ICD-10-CM | POA: Diagnosis not present

## 2021-01-29 DIAGNOSIS — R531 Weakness: Secondary | ICD-10-CM | POA: Diagnosis not present

## 2021-01-29 DIAGNOSIS — D500 Iron deficiency anemia secondary to blood loss (chronic): Secondary | ICD-10-CM | POA: Diagnosis not present

## 2021-01-29 DIAGNOSIS — D649 Anemia, unspecified: Secondary | ICD-10-CM | POA: Diagnosis not present

## 2021-01-29 DIAGNOSIS — R200 Anesthesia of skin: Secondary | ICD-10-CM | POA: Diagnosis not present

## 2021-01-29 DIAGNOSIS — E119 Type 2 diabetes mellitus without complications: Secondary | ICD-10-CM | POA: Diagnosis not present

## 2021-01-29 DIAGNOSIS — I739 Peripheral vascular disease, unspecified: Secondary | ICD-10-CM | POA: Diagnosis not present

## 2021-01-29 DIAGNOSIS — I639 Cerebral infarction, unspecified: Secondary | ICD-10-CM | POA: Diagnosis not present

## 2021-01-29 DIAGNOSIS — Z7952 Long term (current) use of systemic steroids: Secondary | ICD-10-CM | POA: Diagnosis not present

## 2021-01-29 DIAGNOSIS — E1140 Type 2 diabetes mellitus with diabetic neuropathy, unspecified: Secondary | ICD-10-CM | POA: Diagnosis not present

## 2021-01-29 DIAGNOSIS — L570 Actinic keratosis: Secondary | ICD-10-CM | POA: Diagnosis not present

## 2021-01-29 DIAGNOSIS — Z9889 Other specified postprocedural states: Secondary | ICD-10-CM | POA: Diagnosis not present

## 2021-01-29 DIAGNOSIS — R262 Difficulty in walking, not elsewhere classified: Secondary | ICD-10-CM | POA: Diagnosis not present

## 2021-01-29 DIAGNOSIS — Z85828 Personal history of other malignant neoplasm of skin: Secondary | ICD-10-CM | POA: Diagnosis not present

## 2021-01-29 DIAGNOSIS — I080 Rheumatic disorders of both mitral and aortic valves: Secondary | ICD-10-CM | POA: Diagnosis not present

## 2021-01-29 DIAGNOSIS — Z9181 History of falling: Secondary | ICD-10-CM | POA: Diagnosis not present

## 2021-01-29 DIAGNOSIS — Z7982 Long term (current) use of aspirin: Secondary | ICD-10-CM | POA: Diagnosis not present

## 2021-01-29 DIAGNOSIS — R2689 Other abnormalities of gait and mobility: Secondary | ICD-10-CM | POA: Diagnosis not present

## 2021-01-29 DIAGNOSIS — I6782 Cerebral ischemia: Secondary | ICD-10-CM | POA: Diagnosis not present

## 2021-01-30 DIAGNOSIS — I639 Cerebral infarction, unspecified: Secondary | ICD-10-CM | POA: Diagnosis not present

## 2021-01-30 DIAGNOSIS — I080 Rheumatic disorders of both mitral and aortic valves: Secondary | ICD-10-CM | POA: Diagnosis not present

## 2021-01-30 DIAGNOSIS — R29818 Other symptoms and signs involving the nervous system: Secondary | ICD-10-CM | POA: Diagnosis not present

## 2021-01-30 DIAGNOSIS — I6523 Occlusion and stenosis of bilateral carotid arteries: Secondary | ICD-10-CM | POA: Diagnosis not present

## 2021-01-30 DIAGNOSIS — Z8673 Personal history of transient ischemic attack (TIA), and cerebral infarction without residual deficits: Secondary | ICD-10-CM | POA: Diagnosis not present

## 2021-01-30 DIAGNOSIS — G319 Degenerative disease of nervous system, unspecified: Secondary | ICD-10-CM | POA: Diagnosis not present

## 2021-01-30 DIAGNOSIS — E119 Type 2 diabetes mellitus without complications: Secondary | ICD-10-CM | POA: Diagnosis not present

## 2021-01-30 DIAGNOSIS — C61 Malignant neoplasm of prostate: Secondary | ICD-10-CM | POA: Diagnosis not present

## 2021-01-30 DIAGNOSIS — R200 Anesthesia of skin: Secondary | ICD-10-CM | POA: Diagnosis not present

## 2021-01-30 DIAGNOSIS — D638 Anemia in other chronic diseases classified elsewhere: Secondary | ICD-10-CM | POA: Diagnosis not present

## 2021-01-30 DIAGNOSIS — Z7902 Long term (current) use of antithrombotics/antiplatelets: Secondary | ICD-10-CM | POA: Diagnosis not present

## 2021-01-30 DIAGNOSIS — I998 Other disorder of circulatory system: Secondary | ICD-10-CM | POA: Diagnosis not present

## 2021-01-30 DIAGNOSIS — Z7982 Long term (current) use of aspirin: Secondary | ICD-10-CM | POA: Diagnosis not present

## 2021-02-02 DIAGNOSIS — I639 Cerebral infarction, unspecified: Secondary | ICD-10-CM | POA: Diagnosis not present

## 2021-02-02 DIAGNOSIS — Z86718 Personal history of other venous thrombosis and embolism: Secondary | ICD-10-CM | POA: Diagnosis not present

## 2021-02-02 DIAGNOSIS — Z7902 Long term (current) use of antithrombotics/antiplatelets: Secondary | ICD-10-CM | POA: Diagnosis not present

## 2021-02-02 DIAGNOSIS — R9082 White matter disease, unspecified: Secondary | ICD-10-CM | POA: Diagnosis not present

## 2021-02-02 DIAGNOSIS — I739 Peripheral vascular disease, unspecified: Secondary | ICD-10-CM | POA: Diagnosis not present

## 2021-02-02 DIAGNOSIS — Z8546 Personal history of malignant neoplasm of prostate: Secondary | ICD-10-CM | POA: Diagnosis not present

## 2021-02-02 DIAGNOSIS — D649 Anemia, unspecified: Secondary | ICD-10-CM | POA: Diagnosis not present

## 2021-02-02 DIAGNOSIS — R531 Weakness: Secondary | ICD-10-CM | POA: Diagnosis not present

## 2021-02-02 DIAGNOSIS — I6782 Cerebral ischemia: Secondary | ICD-10-CM | POA: Diagnosis not present

## 2021-02-02 DIAGNOSIS — I63239 Cerebral infarction due to unspecified occlusion or stenosis of unspecified carotid arteries: Secondary | ICD-10-CM | POA: Diagnosis not present

## 2021-02-02 DIAGNOSIS — Z79899 Other long term (current) drug therapy: Secondary | ICD-10-CM | POA: Diagnosis not present

## 2021-02-02 DIAGNOSIS — R2990 Unspecified symptoms and signs involving the nervous system: Secondary | ICD-10-CM | POA: Diagnosis not present

## 2021-02-02 DIAGNOSIS — E1151 Type 2 diabetes mellitus with diabetic peripheral angiopathy without gangrene: Secondary | ICD-10-CM | POA: Diagnosis not present

## 2021-02-02 DIAGNOSIS — Z8673 Personal history of transient ischemic attack (TIA), and cerebral infarction without residual deficits: Secondary | ICD-10-CM | POA: Diagnosis not present

## 2021-02-02 DIAGNOSIS — Z7982 Long term (current) use of aspirin: Secondary | ICD-10-CM | POA: Diagnosis not present

## 2021-02-02 DIAGNOSIS — I6529 Occlusion and stenosis of unspecified carotid artery: Secondary | ICD-10-CM | POA: Diagnosis not present

## 2021-02-02 DIAGNOSIS — I6523 Occlusion and stenosis of bilateral carotid arteries: Secondary | ICD-10-CM | POA: Diagnosis not present

## 2021-02-05 DIAGNOSIS — D469 Myelodysplastic syndrome, unspecified: Secondary | ICD-10-CM | POA: Diagnosis not present

## 2021-02-05 DIAGNOSIS — D61818 Other pancytopenia: Secondary | ICD-10-CM | POA: Diagnosis not present

## 2021-02-05 DIAGNOSIS — D649 Anemia, unspecified: Secondary | ICD-10-CM | POA: Diagnosis not present

## 2021-02-05 DIAGNOSIS — D500 Iron deficiency anemia secondary to blood loss (chronic): Secondary | ICD-10-CM | POA: Diagnosis not present

## 2021-02-06 DIAGNOSIS — D469 Myelodysplastic syndrome, unspecified: Secondary | ICD-10-CM | POA: Diagnosis not present

## 2021-02-06 DIAGNOSIS — D638 Anemia in other chronic diseases classified elsewhere: Secondary | ICD-10-CM | POA: Diagnosis not present

## 2021-02-06 DIAGNOSIS — D500 Iron deficiency anemia secondary to blood loss (chronic): Secondary | ICD-10-CM | POA: Diagnosis not present

## 2021-02-06 DIAGNOSIS — Z95828 Presence of other vascular implants and grafts: Secondary | ICD-10-CM | POA: Diagnosis not present

## 2021-02-07 DIAGNOSIS — R200 Anesthesia of skin: Secondary | ICD-10-CM | POA: Diagnosis not present

## 2021-02-07 DIAGNOSIS — G1119 Other early-onset cerebellar ataxia: Secondary | ICD-10-CM | POA: Diagnosis not present

## 2021-02-07 DIAGNOSIS — I69951 Hemiplegia and hemiparesis following unspecified cerebrovascular disease affecting right dominant side: Secondary | ICD-10-CM | POA: Diagnosis not present

## 2021-02-13 DIAGNOSIS — M25571 Pain in right ankle and joints of right foot: Secondary | ICD-10-CM | POA: Diagnosis not present

## 2021-02-13 DIAGNOSIS — R2681 Unsteadiness on feet: Secondary | ICD-10-CM | POA: Diagnosis not present

## 2021-02-13 DIAGNOSIS — M79671 Pain in right foot: Secondary | ICD-10-CM | POA: Diagnosis not present

## 2021-02-13 DIAGNOSIS — S86011D Strain of right Achilles tendon, subsequent encounter: Secondary | ICD-10-CM | POA: Diagnosis not present

## 2021-02-13 DIAGNOSIS — M25674 Stiffness of right foot, not elsewhere classified: Secondary | ICD-10-CM | POA: Diagnosis not present

## 2021-02-13 DIAGNOSIS — D469 Myelodysplastic syndrome, unspecified: Secondary | ICD-10-CM | POA: Diagnosis not present

## 2021-02-14 DIAGNOSIS — Z20828 Contact with and (suspected) exposure to other viral communicable diseases: Secondary | ICD-10-CM | POA: Diagnosis not present

## 2021-02-15 DIAGNOSIS — M79671 Pain in right foot: Secondary | ICD-10-CM | POA: Diagnosis not present

## 2021-02-15 DIAGNOSIS — S86011D Strain of right Achilles tendon, subsequent encounter: Secondary | ICD-10-CM | POA: Diagnosis not present

## 2021-02-15 DIAGNOSIS — M25674 Stiffness of right foot, not elsewhere classified: Secondary | ICD-10-CM | POA: Diagnosis not present

## 2021-02-15 DIAGNOSIS — R2681 Unsteadiness on feet: Secondary | ICD-10-CM | POA: Diagnosis not present

## 2021-02-15 DIAGNOSIS — M25571 Pain in right ankle and joints of right foot: Secondary | ICD-10-CM | POA: Diagnosis not present

## 2021-02-20 DIAGNOSIS — M25674 Stiffness of right foot, not elsewhere classified: Secondary | ICD-10-CM | POA: Diagnosis not present

## 2021-02-20 DIAGNOSIS — M25571 Pain in right ankle and joints of right foot: Secondary | ICD-10-CM | POA: Diagnosis not present

## 2021-02-20 DIAGNOSIS — S86011D Strain of right Achilles tendon, subsequent encounter: Secondary | ICD-10-CM | POA: Diagnosis not present

## 2021-02-20 DIAGNOSIS — R2681 Unsteadiness on feet: Secondary | ICD-10-CM | POA: Diagnosis not present

## 2021-02-20 DIAGNOSIS — D469 Myelodysplastic syndrome, unspecified: Secondary | ICD-10-CM | POA: Diagnosis not present

## 2021-02-20 DIAGNOSIS — M79671 Pain in right foot: Secondary | ICD-10-CM | POA: Diagnosis not present

## 2021-02-21 DIAGNOSIS — D638 Anemia in other chronic diseases classified elsewhere: Secondary | ICD-10-CM | POA: Diagnosis not present

## 2021-02-21 DIAGNOSIS — Z95828 Presence of other vascular implants and grafts: Secondary | ICD-10-CM | POA: Diagnosis not present

## 2021-02-22 DIAGNOSIS — M25571 Pain in right ankle and joints of right foot: Secondary | ICD-10-CM | POA: Diagnosis not present

## 2021-02-22 DIAGNOSIS — S86011D Strain of right Achilles tendon, subsequent encounter: Secondary | ICD-10-CM | POA: Diagnosis not present

## 2021-02-22 DIAGNOSIS — M25674 Stiffness of right foot, not elsewhere classified: Secondary | ICD-10-CM | POA: Diagnosis not present

## 2021-02-22 DIAGNOSIS — M79671 Pain in right foot: Secondary | ICD-10-CM | POA: Diagnosis not present

## 2021-02-22 DIAGNOSIS — R2681 Unsteadiness on feet: Secondary | ICD-10-CM | POA: Diagnosis not present

## 2021-02-26 DIAGNOSIS — D469 Myelodysplastic syndrome, unspecified: Secondary | ICD-10-CM | POA: Diagnosis not present

## 2021-02-27 DIAGNOSIS — E119 Type 2 diabetes mellitus without complications: Secondary | ICD-10-CM | POA: Diagnosis not present

## 2021-02-27 DIAGNOSIS — S98112A Complete traumatic amputation of left great toe, initial encounter: Secondary | ICD-10-CM | POA: Diagnosis not present

## 2021-02-27 DIAGNOSIS — L600 Ingrowing nail: Secondary | ICD-10-CM | POA: Diagnosis not present

## 2021-02-27 DIAGNOSIS — M79672 Pain in left foot: Secondary | ICD-10-CM | POA: Diagnosis not present

## 2021-02-27 DIAGNOSIS — I639 Cerebral infarction, unspecified: Secondary | ICD-10-CM | POA: Diagnosis not present

## 2021-02-27 DIAGNOSIS — Z01818 Encounter for other preprocedural examination: Secondary | ICD-10-CM | POA: Diagnosis not present

## 2021-02-27 DIAGNOSIS — Z794 Long term (current) use of insulin: Secondary | ICD-10-CM | POA: Diagnosis not present

## 2021-02-27 DIAGNOSIS — I739 Peripheral vascular disease, unspecified: Secondary | ICD-10-CM | POA: Diagnosis not present

## 2021-02-28 DIAGNOSIS — L570 Actinic keratosis: Secondary | ICD-10-CM | POA: Diagnosis not present

## 2021-02-28 DIAGNOSIS — C44329 Squamous cell carcinoma of skin of other parts of face: Secondary | ICD-10-CM | POA: Diagnosis not present

## 2021-02-28 DIAGNOSIS — Z01810 Encounter for preprocedural cardiovascular examination: Secondary | ICD-10-CM | POA: Diagnosis not present

## 2021-02-28 DIAGNOSIS — D485 Neoplasm of uncertain behavior of skin: Secondary | ICD-10-CM | POA: Diagnosis not present

## 2021-03-02 DIAGNOSIS — S86011D Strain of right Achilles tendon, subsequent encounter: Secondary | ICD-10-CM | POA: Diagnosis not present

## 2021-03-02 DIAGNOSIS — M79671 Pain in right foot: Secondary | ICD-10-CM | POA: Diagnosis not present

## 2021-03-02 DIAGNOSIS — M25571 Pain in right ankle and joints of right foot: Secondary | ICD-10-CM | POA: Diagnosis not present

## 2021-03-02 DIAGNOSIS — R2681 Unsteadiness on feet: Secondary | ICD-10-CM | POA: Diagnosis not present

## 2021-03-02 DIAGNOSIS — M25674 Stiffness of right foot, not elsewhere classified: Secondary | ICD-10-CM | POA: Diagnosis not present

## 2021-03-05 DIAGNOSIS — M25674 Stiffness of right foot, not elsewhere classified: Secondary | ICD-10-CM | POA: Diagnosis not present

## 2021-03-05 DIAGNOSIS — S86011D Strain of right Achilles tendon, subsequent encounter: Secondary | ICD-10-CM | POA: Diagnosis not present

## 2021-03-05 DIAGNOSIS — D469 Myelodysplastic syndrome, unspecified: Secondary | ICD-10-CM | POA: Diagnosis not present

## 2021-03-05 DIAGNOSIS — R2681 Unsteadiness on feet: Secondary | ICD-10-CM | POA: Diagnosis not present

## 2021-03-05 DIAGNOSIS — M79671 Pain in right foot: Secondary | ICD-10-CM | POA: Diagnosis not present

## 2021-03-05 DIAGNOSIS — M25571 Pain in right ankle and joints of right foot: Secondary | ICD-10-CM | POA: Diagnosis not present

## 2021-03-12 DIAGNOSIS — E1140 Type 2 diabetes mellitus with diabetic neuropathy, unspecified: Secondary | ICD-10-CM | POA: Diagnosis not present

## 2021-03-12 DIAGNOSIS — D649 Anemia, unspecified: Secondary | ICD-10-CM | POA: Diagnosis not present

## 2021-03-12 DIAGNOSIS — I6529 Occlusion and stenosis of unspecified carotid artery: Secondary | ICD-10-CM | POA: Diagnosis not present

## 2021-03-12 DIAGNOSIS — M1120 Other chondrocalcinosis, unspecified site: Secondary | ICD-10-CM | POA: Diagnosis not present

## 2021-03-12 DIAGNOSIS — G629 Polyneuropathy, unspecified: Secondary | ICD-10-CM | POA: Diagnosis not present

## 2021-03-12 DIAGNOSIS — Z8673 Personal history of transient ischemic attack (TIA), and cerebral infarction without residual deficits: Secondary | ICD-10-CM | POA: Diagnosis not present

## 2021-03-12 DIAGNOSIS — B0229 Other postherpetic nervous system involvement: Secondary | ICD-10-CM | POA: Diagnosis not present

## 2021-03-13 DIAGNOSIS — R2681 Unsteadiness on feet: Secondary | ICD-10-CM | POA: Diagnosis not present

## 2021-03-13 DIAGNOSIS — M79671 Pain in right foot: Secondary | ICD-10-CM | POA: Diagnosis not present

## 2021-03-13 DIAGNOSIS — M25571 Pain in right ankle and joints of right foot: Secondary | ICD-10-CM | POA: Diagnosis not present

## 2021-03-13 DIAGNOSIS — S86011D Strain of right Achilles tendon, subsequent encounter: Secondary | ICD-10-CM | POA: Diagnosis not present

## 2021-03-13 DIAGNOSIS — M25674 Stiffness of right foot, not elsewhere classified: Secondary | ICD-10-CM | POA: Diagnosis not present

## 2021-03-14 DIAGNOSIS — Z7902 Long term (current) use of antithrombotics/antiplatelets: Secondary | ICD-10-CM | POA: Diagnosis not present

## 2021-03-14 DIAGNOSIS — Z8546 Personal history of malignant neoplasm of prostate: Secondary | ICD-10-CM | POA: Diagnosis not present

## 2021-03-14 DIAGNOSIS — I6521 Occlusion and stenosis of right carotid artery: Secondary | ICD-10-CM | POA: Diagnosis not present

## 2021-03-14 DIAGNOSIS — Z794 Long term (current) use of insulin: Secondary | ICD-10-CM | POA: Diagnosis not present

## 2021-03-14 DIAGNOSIS — Z9889 Other specified postprocedural states: Secondary | ICD-10-CM | POA: Diagnosis not present

## 2021-03-14 DIAGNOSIS — I9581 Postprocedural hypotension: Secondary | ICD-10-CM | POA: Diagnosis not present

## 2021-03-14 DIAGNOSIS — E1140 Type 2 diabetes mellitus with diabetic neuropathy, unspecified: Secondary | ICD-10-CM | POA: Diagnosis not present

## 2021-03-14 DIAGNOSIS — R638 Other symptoms and signs concerning food and fluid intake: Secondary | ICD-10-CM | POA: Diagnosis not present

## 2021-03-14 DIAGNOSIS — I10 Essential (primary) hypertension: Secondary | ICD-10-CM | POA: Diagnosis not present

## 2021-03-14 DIAGNOSIS — E1151 Type 2 diabetes mellitus with diabetic peripheral angiopathy without gangrene: Secondary | ICD-10-CM | POA: Diagnosis not present

## 2021-03-14 DIAGNOSIS — Z7982 Long term (current) use of aspirin: Secondary | ICD-10-CM | POA: Diagnosis not present

## 2021-03-14 DIAGNOSIS — E785 Hyperlipidemia, unspecified: Secondary | ICD-10-CM | POA: Diagnosis not present

## 2021-03-14 DIAGNOSIS — Z7952 Long term (current) use of systemic steroids: Secondary | ICD-10-CM | POA: Diagnosis not present

## 2021-03-14 DIAGNOSIS — T380X5A Adverse effect of glucocorticoids and synthetic analogues, initial encounter: Secondary | ICD-10-CM | POA: Diagnosis not present

## 2021-03-14 DIAGNOSIS — E11649 Type 2 diabetes mellitus with hypoglycemia without coma: Secondary | ICD-10-CM | POA: Diagnosis not present

## 2021-03-14 DIAGNOSIS — R0902 Hypoxemia: Secondary | ICD-10-CM | POA: Diagnosis not present

## 2021-03-14 DIAGNOSIS — Z20822 Contact with and (suspected) exposure to covid-19: Secondary | ICD-10-CM | POA: Diagnosis not present

## 2021-03-14 DIAGNOSIS — Z96653 Presence of artificial knee joint, bilateral: Secondary | ICD-10-CM | POA: Diagnosis not present

## 2021-03-14 DIAGNOSIS — D62 Acute posthemorrhagic anemia: Secondary | ICD-10-CM | POA: Diagnosis not present

## 2021-03-14 DIAGNOSIS — J9811 Atelectasis: Secondary | ICD-10-CM | POA: Diagnosis not present

## 2021-03-14 DIAGNOSIS — E1165 Type 2 diabetes mellitus with hyperglycemia: Secondary | ICD-10-CM | POA: Diagnosis not present

## 2021-03-20 DIAGNOSIS — D469 Myelodysplastic syndrome, unspecified: Secondary | ICD-10-CM | POA: Diagnosis not present

## 2021-03-20 DIAGNOSIS — D649 Anemia, unspecified: Secondary | ICD-10-CM | POA: Diagnosis not present

## 2021-03-22 DIAGNOSIS — Z85828 Personal history of other malignant neoplasm of skin: Secondary | ICD-10-CM | POA: Diagnosis not present

## 2021-03-22 DIAGNOSIS — L570 Actinic keratosis: Secondary | ICD-10-CM | POA: Diagnosis not present

## 2021-03-22 DIAGNOSIS — Z1283 Encounter for screening for malignant neoplasm of skin: Secondary | ICD-10-CM | POA: Diagnosis not present

## 2021-03-26 DIAGNOSIS — T380X5A Adverse effect of glucocorticoids and synthetic analogues, initial encounter: Secondary | ICD-10-CM | POA: Diagnosis not present

## 2021-03-26 DIAGNOSIS — M818 Other osteoporosis without current pathological fracture: Secondary | ICD-10-CM | POA: Diagnosis not present

## 2021-03-26 DIAGNOSIS — C61 Malignant neoplasm of prostate: Secondary | ICD-10-CM | POA: Diagnosis not present

## 2021-03-26 DIAGNOSIS — Z7985 Long-term (current) use of injectable non-insulin antidiabetic drugs: Secondary | ICD-10-CM | POA: Diagnosis not present

## 2021-03-26 DIAGNOSIS — R17 Unspecified jaundice: Secondary | ICD-10-CM | POA: Diagnosis not present

## 2021-03-26 DIAGNOSIS — M1990 Unspecified osteoarthritis, unspecified site: Secondary | ICD-10-CM | POA: Diagnosis not present

## 2021-03-26 DIAGNOSIS — D649 Anemia, unspecified: Secondary | ICD-10-CM | POA: Diagnosis not present

## 2021-03-26 DIAGNOSIS — E1151 Type 2 diabetes mellitus with diabetic peripheral angiopathy without gangrene: Secondary | ICD-10-CM | POA: Diagnosis not present

## 2021-03-26 DIAGNOSIS — R7989 Other specified abnormal findings of blood chemistry: Secondary | ICD-10-CM | POA: Diagnosis not present

## 2021-03-26 DIAGNOSIS — D469 Myelodysplastic syndrome, unspecified: Secondary | ICD-10-CM | POA: Diagnosis not present

## 2021-03-26 DIAGNOSIS — D539 Nutritional anemia, unspecified: Secondary | ICD-10-CM | POA: Diagnosis not present

## 2021-03-28 DIAGNOSIS — T380X5A Adverse effect of glucocorticoids and synthetic analogues, initial encounter: Secondary | ICD-10-CM | POA: Diagnosis not present

## 2021-03-28 DIAGNOSIS — D469 Myelodysplastic syndrome, unspecified: Secondary | ICD-10-CM | POA: Diagnosis not present

## 2021-03-28 DIAGNOSIS — C61 Malignant neoplasm of prostate: Secondary | ICD-10-CM | POA: Diagnosis not present

## 2021-03-28 DIAGNOSIS — M1990 Unspecified osteoarthritis, unspecified site: Secondary | ICD-10-CM | POA: Diagnosis not present

## 2021-03-28 DIAGNOSIS — M818 Other osteoporosis without current pathological fracture: Secondary | ICD-10-CM | POA: Diagnosis not present

## 2021-03-28 DIAGNOSIS — R7989 Other specified abnormal findings of blood chemistry: Secondary | ICD-10-CM | POA: Diagnosis not present

## 2021-04-03 DIAGNOSIS — Z23 Encounter for immunization: Secondary | ICD-10-CM | POA: Diagnosis not present

## 2021-04-03 DIAGNOSIS — E1140 Type 2 diabetes mellitus with diabetic neuropathy, unspecified: Secondary | ICD-10-CM | POA: Diagnosis not present

## 2021-04-03 DIAGNOSIS — I739 Peripheral vascular disease, unspecified: Secondary | ICD-10-CM | POA: Diagnosis not present

## 2021-04-03 DIAGNOSIS — B0229 Other postherpetic nervous system involvement: Secondary | ICD-10-CM | POA: Diagnosis not present

## 2021-04-03 DIAGNOSIS — I6529 Occlusion and stenosis of unspecified carotid artery: Secondary | ICD-10-CM | POA: Diagnosis not present

## 2021-04-03 DIAGNOSIS — Z8673 Personal history of transient ischemic attack (TIA), and cerebral infarction without residual deficits: Secondary | ICD-10-CM | POA: Diagnosis not present

## 2021-04-03 DIAGNOSIS — M1120 Other chondrocalcinosis, unspecified site: Secondary | ICD-10-CM | POA: Diagnosis not present

## 2021-04-10 DIAGNOSIS — D649 Anemia, unspecified: Secondary | ICD-10-CM | POA: Diagnosis not present

## 2021-04-10 DIAGNOSIS — D472 Monoclonal gammopathy: Secondary | ICD-10-CM | POA: Diagnosis not present

## 2021-04-10 DIAGNOSIS — Z5189 Encounter for other specified aftercare: Secondary | ICD-10-CM | POA: Diagnosis not present

## 2021-04-10 DIAGNOSIS — C61 Malignant neoplasm of prostate: Secondary | ICD-10-CM | POA: Diagnosis not present

## 2021-04-10 DIAGNOSIS — Z8546 Personal history of malignant neoplasm of prostate: Secondary | ICD-10-CM | POA: Diagnosis not present

## 2021-04-10 DIAGNOSIS — D539 Nutritional anemia, unspecified: Secondary | ICD-10-CM | POA: Diagnosis not present

## 2021-04-10 DIAGNOSIS — D6101 Constitutional (pure) red blood cell aplasia: Secondary | ICD-10-CM | POA: Diagnosis not present

## 2021-04-10 DIAGNOSIS — D7589 Other specified diseases of blood and blood-forming organs: Secondary | ICD-10-CM | POA: Diagnosis not present

## 2021-04-11 DIAGNOSIS — D469 Myelodysplastic syndrome, unspecified: Secondary | ICD-10-CM | POA: Diagnosis not present

## 2021-04-11 DIAGNOSIS — D638 Anemia in other chronic diseases classified elsewhere: Secondary | ICD-10-CM | POA: Diagnosis not present

## 2021-04-13 DIAGNOSIS — Z86718 Personal history of other venous thrombosis and embolism: Secondary | ICD-10-CM | POA: Diagnosis not present

## 2021-04-13 DIAGNOSIS — Z8546 Personal history of malignant neoplasm of prostate: Secondary | ICD-10-CM | POA: Diagnosis not present

## 2021-04-13 DIAGNOSIS — Z95820 Peripheral vascular angioplasty status with implants and grafts: Secondary | ICD-10-CM | POA: Diagnosis not present

## 2021-04-13 DIAGNOSIS — E1151 Type 2 diabetes mellitus with diabetic peripheral angiopathy without gangrene: Secondary | ICD-10-CM | POA: Diagnosis not present

## 2021-04-13 DIAGNOSIS — Z87891 Personal history of nicotine dependence: Secondary | ICD-10-CM | POA: Diagnosis not present

## 2021-04-13 DIAGNOSIS — I739 Peripheral vascular disease, unspecified: Secondary | ICD-10-CM | POA: Diagnosis not present

## 2021-04-13 DIAGNOSIS — Z7982 Long term (current) use of aspirin: Secondary | ICD-10-CM | POA: Diagnosis not present

## 2021-04-13 DIAGNOSIS — I6521 Occlusion and stenosis of right carotid artery: Secondary | ICD-10-CM | POA: Diagnosis not present

## 2021-04-13 DIAGNOSIS — L97521 Non-pressure chronic ulcer of other part of left foot limited to breakdown of skin: Secondary | ICD-10-CM | POA: Diagnosis not present

## 2021-04-13 DIAGNOSIS — B0221 Postherpetic geniculate ganglionitis: Secondary | ICD-10-CM | POA: Diagnosis not present

## 2021-04-13 DIAGNOSIS — Z89412 Acquired absence of left great toe: Secondary | ICD-10-CM | POA: Diagnosis not present

## 2021-04-13 DIAGNOSIS — I6523 Occlusion and stenosis of bilateral carotid arteries: Secondary | ICD-10-CM | POA: Diagnosis not present

## 2021-04-13 DIAGNOSIS — D472 Monoclonal gammopathy: Secondary | ICD-10-CM | POA: Diagnosis not present

## 2021-04-13 DIAGNOSIS — Z79899 Other long term (current) drug therapy: Secondary | ICD-10-CM | POA: Diagnosis not present

## 2021-04-13 DIAGNOSIS — E11621 Type 2 diabetes mellitus with foot ulcer: Secondary | ICD-10-CM | POA: Diagnosis not present

## 2021-04-13 DIAGNOSIS — Z95828 Presence of other vascular implants and grafts: Secondary | ICD-10-CM | POA: Diagnosis not present

## 2021-04-16 DIAGNOSIS — D638 Anemia in other chronic diseases classified elsewhere: Secondary | ICD-10-CM | POA: Diagnosis not present

## 2021-04-17 DIAGNOSIS — E119 Type 2 diabetes mellitus without complications: Secondary | ICD-10-CM | POA: Diagnosis not present

## 2021-04-17 DIAGNOSIS — E1151 Type 2 diabetes mellitus with diabetic peripheral angiopathy without gangrene: Secondary | ICD-10-CM | POA: Diagnosis not present

## 2021-04-17 DIAGNOSIS — I739 Peripheral vascular disease, unspecified: Secondary | ICD-10-CM | POA: Diagnosis not present

## 2021-04-17 DIAGNOSIS — M79672 Pain in left foot: Secondary | ICD-10-CM | POA: Diagnosis not present

## 2021-04-17 DIAGNOSIS — L603 Nail dystrophy: Secondary | ICD-10-CM | POA: Diagnosis not present

## 2021-04-17 DIAGNOSIS — Z89412 Acquired absence of left great toe: Secondary | ICD-10-CM | POA: Diagnosis not present

## 2021-04-17 DIAGNOSIS — Z794 Long term (current) use of insulin: Secondary | ICD-10-CM | POA: Diagnosis not present

## 2021-04-17 DIAGNOSIS — I639 Cerebral infarction, unspecified: Secondary | ICD-10-CM | POA: Diagnosis not present

## 2021-04-17 DIAGNOSIS — L600 Ingrowing nail: Secondary | ICD-10-CM | POA: Diagnosis not present

## 2021-04-18 DIAGNOSIS — Z1283 Encounter for screening for malignant neoplasm of skin: Secondary | ICD-10-CM | POA: Diagnosis not present

## 2021-04-18 DIAGNOSIS — Z85828 Personal history of other malignant neoplasm of skin: Secondary | ICD-10-CM | POA: Diagnosis not present

## 2021-04-18 DIAGNOSIS — L570 Actinic keratosis: Secondary | ICD-10-CM | POA: Diagnosis not present

## 2021-04-18 DIAGNOSIS — D469 Myelodysplastic syndrome, unspecified: Secondary | ICD-10-CM | POA: Diagnosis not present

## 2021-04-18 DIAGNOSIS — D472 Monoclonal gammopathy: Secondary | ICD-10-CM | POA: Diagnosis not present

## 2021-04-18 DIAGNOSIS — D638 Anemia in other chronic diseases classified elsewhere: Secondary | ICD-10-CM | POA: Diagnosis not present

## 2021-04-18 DIAGNOSIS — C61 Malignant neoplasm of prostate: Secondary | ICD-10-CM | POA: Diagnosis not present

## 2021-04-19 DIAGNOSIS — D460 Refractory anemia without ring sideroblasts, so stated: Secondary | ICD-10-CM | POA: Diagnosis not present

## 2021-04-23 DIAGNOSIS — D460 Refractory anemia without ring sideroblasts, so stated: Secondary | ICD-10-CM | POA: Diagnosis not present

## 2021-04-25 DIAGNOSIS — C61 Malignant neoplasm of prostate: Secondary | ICD-10-CM | POA: Diagnosis not present

## 2021-04-25 DIAGNOSIS — M1120 Other chondrocalcinosis, unspecified site: Secondary | ICD-10-CM | POA: Diagnosis not present

## 2021-04-25 DIAGNOSIS — D460 Refractory anemia without ring sideroblasts, so stated: Secondary | ICD-10-CM | POA: Diagnosis not present

## 2021-04-25 DIAGNOSIS — Z79899 Other long term (current) drug therapy: Secondary | ICD-10-CM | POA: Diagnosis not present

## 2021-04-25 DIAGNOSIS — D638 Anemia in other chronic diseases classified elsewhere: Secondary | ICD-10-CM | POA: Diagnosis not present

## 2021-04-27 DIAGNOSIS — D460 Refractory anemia without ring sideroblasts, so stated: Secondary | ICD-10-CM | POA: Diagnosis not present

## 2021-04-30 DIAGNOSIS — D460 Refractory anemia without ring sideroblasts, so stated: Secondary | ICD-10-CM | POA: Diagnosis not present

## 2021-05-02 DIAGNOSIS — D460 Refractory anemia without ring sideroblasts, so stated: Secondary | ICD-10-CM | POA: Diagnosis not present

## 2021-05-02 DIAGNOSIS — M9272 Juvenile osteochondrosis of metatarsus, left foot: Secondary | ICD-10-CM | POA: Diagnosis not present

## 2021-05-02 DIAGNOSIS — C61 Malignant neoplasm of prostate: Secondary | ICD-10-CM | POA: Diagnosis not present

## 2021-05-02 DIAGNOSIS — K2901 Acute gastritis with bleeding: Secondary | ICD-10-CM | POA: Diagnosis not present

## 2021-05-02 DIAGNOSIS — H7091 Unspecified mastoiditis, right ear: Secondary | ICD-10-CM | POA: Diagnosis not present

## 2021-05-02 DIAGNOSIS — D469 Myelodysplastic syndrome, unspecified: Secondary | ICD-10-CM | POA: Diagnosis not present

## 2021-05-02 DIAGNOSIS — D638 Anemia in other chronic diseases classified elsewhere: Secondary | ICD-10-CM | POA: Diagnosis not present

## 2021-05-04 DIAGNOSIS — D460 Refractory anemia without ring sideroblasts, so stated: Secondary | ICD-10-CM | POA: Diagnosis not present

## 2021-05-04 DIAGNOSIS — E1140 Type 2 diabetes mellitus with diabetic neuropathy, unspecified: Secondary | ICD-10-CM | POA: Diagnosis not present

## 2021-05-04 DIAGNOSIS — B0229 Other postherpetic nervous system involvement: Secondary | ICD-10-CM | POA: Diagnosis not present

## 2021-05-04 DIAGNOSIS — Z8673 Personal history of transient ischemic attack (TIA), and cerebral infarction without residual deficits: Secondary | ICD-10-CM | POA: Diagnosis not present

## 2021-05-04 DIAGNOSIS — M1120 Other chondrocalcinosis, unspecified site: Secondary | ICD-10-CM | POA: Diagnosis not present

## 2021-05-04 DIAGNOSIS — Z0001 Encounter for general adult medical examination with abnormal findings: Secondary | ICD-10-CM | POA: Diagnosis not present

## 2021-05-04 DIAGNOSIS — I739 Peripheral vascular disease, unspecified: Secondary | ICD-10-CM | POA: Diagnosis not present

## 2021-05-04 DIAGNOSIS — I6529 Occlusion and stenosis of unspecified carotid artery: Secondary | ICD-10-CM | POA: Diagnosis not present

## 2021-05-07 DIAGNOSIS — D472 Monoclonal gammopathy: Secondary | ICD-10-CM | POA: Diagnosis not present

## 2021-05-07 DIAGNOSIS — D469 Myelodysplastic syndrome, unspecified: Secondary | ICD-10-CM | POA: Diagnosis not present

## 2021-05-07 DIAGNOSIS — C61 Malignant neoplasm of prostate: Secondary | ICD-10-CM | POA: Diagnosis not present

## 2021-05-07 DIAGNOSIS — D460 Refractory anemia without ring sideroblasts, so stated: Secondary | ICD-10-CM | POA: Diagnosis not present

## 2021-05-07 DIAGNOSIS — M9272 Juvenile osteochondrosis of metatarsus, left foot: Secondary | ICD-10-CM | POA: Diagnosis not present

## 2021-05-09 DIAGNOSIS — M818 Other osteoporosis without current pathological fracture: Secondary | ICD-10-CM | POA: Diagnosis not present

## 2021-05-09 DIAGNOSIS — C44329 Squamous cell carcinoma of skin of other parts of face: Secondary | ICD-10-CM | POA: Diagnosis not present

## 2021-05-09 DIAGNOSIS — T380X5A Adverse effect of glucocorticoids and synthetic analogues, initial encounter: Secondary | ICD-10-CM | POA: Diagnosis not present

## 2021-05-09 DIAGNOSIS — D469 Myelodysplastic syndrome, unspecified: Secondary | ICD-10-CM | POA: Diagnosis not present

## 2021-05-09 DIAGNOSIS — D649 Anemia, unspecified: Secondary | ICD-10-CM | POA: Diagnosis not present

## 2021-05-11 DIAGNOSIS — Z20822 Contact with and (suspected) exposure to covid-19: Secondary | ICD-10-CM | POA: Diagnosis not present

## 2021-05-29 DIAGNOSIS — T380X5A Adverse effect of glucocorticoids and synthetic analogues, initial encounter: Secondary | ICD-10-CM | POA: Diagnosis not present

## 2021-05-29 DIAGNOSIS — Z1501 Genetic susceptibility to malignant neoplasm of breast: Secondary | ICD-10-CM | POA: Diagnosis not present

## 2021-05-29 DIAGNOSIS — D638 Anemia in other chronic diseases classified elsewhere: Secondary | ICD-10-CM | POA: Diagnosis not present

## 2021-05-29 DIAGNOSIS — D469 Myelodysplastic syndrome, unspecified: Secondary | ICD-10-CM | POA: Diagnosis not present

## 2021-05-29 DIAGNOSIS — M818 Other osteoporosis without current pathological fracture: Secondary | ICD-10-CM | POA: Diagnosis not present

## 2021-05-29 DIAGNOSIS — M9272 Juvenile osteochondrosis of metatarsus, left foot: Secondary | ICD-10-CM | POA: Diagnosis not present

## 2021-05-29 DIAGNOSIS — Z791 Long term (current) use of non-steroidal anti-inflammatories (NSAID): Secondary | ICD-10-CM | POA: Diagnosis not present

## 2021-05-29 DIAGNOSIS — M1990 Unspecified osteoarthritis, unspecified site: Secondary | ICD-10-CM | POA: Diagnosis not present

## 2021-05-29 DIAGNOSIS — D472 Monoclonal gammopathy: Secondary | ICD-10-CM | POA: Diagnosis not present

## 2021-05-30 DIAGNOSIS — D638 Anemia in other chronic diseases classified elsewhere: Secondary | ICD-10-CM | POA: Diagnosis not present

## 2021-05-30 DIAGNOSIS — Z95828 Presence of other vascular implants and grafts: Secondary | ICD-10-CM | POA: Diagnosis not present

## 2021-05-31 DIAGNOSIS — T380X5A Adverse effect of glucocorticoids and synthetic analogues, initial encounter: Secondary | ICD-10-CM | POA: Diagnosis not present

## 2021-05-31 DIAGNOSIS — D469 Myelodysplastic syndrome, unspecified: Secondary | ICD-10-CM | POA: Diagnosis not present

## 2021-05-31 DIAGNOSIS — M818 Other osteoporosis without current pathological fracture: Secondary | ICD-10-CM | POA: Diagnosis not present

## 2021-06-05 DIAGNOSIS — E119 Type 2 diabetes mellitus without complications: Secondary | ICD-10-CM | POA: Diagnosis not present

## 2021-06-05 DIAGNOSIS — S98112A Complete traumatic amputation of left great toe, initial encounter: Secondary | ICD-10-CM | POA: Diagnosis not present

## 2021-06-05 DIAGNOSIS — Z89412 Acquired absence of left great toe: Secondary | ICD-10-CM | POA: Diagnosis not present

## 2021-06-05 DIAGNOSIS — I739 Peripheral vascular disease, unspecified: Secondary | ICD-10-CM | POA: Diagnosis not present

## 2021-06-05 DIAGNOSIS — Z794 Long term (current) use of insulin: Secondary | ICD-10-CM | POA: Diagnosis not present

## 2021-06-05 DIAGNOSIS — E1151 Type 2 diabetes mellitus with diabetic peripheral angiopathy without gangrene: Secondary | ICD-10-CM | POA: Diagnosis not present

## 2021-06-13 DIAGNOSIS — E119 Type 2 diabetes mellitus without complications: Secondary | ICD-10-CM | POA: Diagnosis not present

## 2021-06-13 DIAGNOSIS — Z79899 Other long term (current) drug therapy: Secondary | ICD-10-CM | POA: Diagnosis not present

## 2021-06-13 DIAGNOSIS — Z961 Presence of intraocular lens: Secondary | ICD-10-CM | POA: Diagnosis not present

## 2021-06-13 DIAGNOSIS — H43811 Vitreous degeneration, right eye: Secondary | ICD-10-CM | POA: Diagnosis not present

## 2021-06-15 ENCOUNTER — Ambulatory Visit: Payer: Medicare Other | Admitting: Neurology

## 2021-06-15 DIAGNOSIS — L97526 Non-pressure chronic ulcer of other part of left foot with bone involvement without evidence of necrosis: Secondary | ICD-10-CM | POA: Diagnosis not present

## 2021-06-15 DIAGNOSIS — E1151 Type 2 diabetes mellitus with diabetic peripheral angiopathy without gangrene: Secondary | ICD-10-CM | POA: Diagnosis not present

## 2021-06-15 DIAGNOSIS — Z7984 Long term (current) use of oral hypoglycemic drugs: Secondary | ICD-10-CM | POA: Diagnosis not present

## 2021-06-15 DIAGNOSIS — E11621 Type 2 diabetes mellitus with foot ulcer: Secondary | ICD-10-CM | POA: Diagnosis not present

## 2021-06-15 DIAGNOSIS — Z87891 Personal history of nicotine dependence: Secondary | ICD-10-CM | POA: Diagnosis not present

## 2021-06-15 DIAGNOSIS — Z89412 Acquired absence of left great toe: Secondary | ICD-10-CM | POA: Diagnosis not present

## 2021-06-15 DIAGNOSIS — D469 Myelodysplastic syndrome, unspecified: Secondary | ICD-10-CM | POA: Diagnosis not present

## 2021-06-15 DIAGNOSIS — I739 Peripheral vascular disease, unspecified: Secondary | ICD-10-CM | POA: Diagnosis not present

## 2021-06-15 DIAGNOSIS — Z7985 Long-term (current) use of injectable non-insulin antidiabetic drugs: Secondary | ICD-10-CM | POA: Diagnosis not present

## 2021-06-19 DIAGNOSIS — B0229 Other postherpetic nervous system involvement: Secondary | ICD-10-CM | POA: Diagnosis not present

## 2021-06-19 DIAGNOSIS — R9389 Abnormal findings on diagnostic imaging of other specified body structures: Secondary | ICD-10-CM | POA: Diagnosis not present

## 2021-06-19 DIAGNOSIS — M1120 Other chondrocalcinosis, unspecified site: Secondary | ICD-10-CM | POA: Diagnosis not present

## 2021-06-19 DIAGNOSIS — Z01818 Encounter for other preprocedural examination: Secondary | ICD-10-CM | POA: Diagnosis not present

## 2021-06-19 DIAGNOSIS — M869 Osteomyelitis, unspecified: Secondary | ICD-10-CM | POA: Diagnosis not present

## 2021-06-19 DIAGNOSIS — E119 Type 2 diabetes mellitus without complications: Secondary | ICD-10-CM | POA: Diagnosis not present

## 2021-06-19 DIAGNOSIS — I63231 Cerebral infarction due to unspecified occlusion or stenosis of right carotid arteries: Secondary | ICD-10-CM | POA: Diagnosis not present

## 2021-06-19 DIAGNOSIS — I739 Peripheral vascular disease, unspecified: Secondary | ICD-10-CM | POA: Diagnosis not present

## 2021-06-19 DIAGNOSIS — D469 Myelodysplastic syndrome, unspecified: Secondary | ICD-10-CM | POA: Diagnosis not present

## 2021-06-19 DIAGNOSIS — Z89412 Acquired absence of left great toe: Secondary | ICD-10-CM | POA: Diagnosis not present

## 2021-06-19 DIAGNOSIS — K219 Gastro-esophageal reflux disease without esophagitis: Secondary | ICD-10-CM | POA: Diagnosis not present

## 2021-06-19 DIAGNOSIS — S98112A Complete traumatic amputation of left great toe, initial encounter: Secondary | ICD-10-CM | POA: Diagnosis not present

## 2021-06-19 DIAGNOSIS — D649 Anemia, unspecified: Secondary | ICD-10-CM | POA: Diagnosis not present

## 2021-06-19 DIAGNOSIS — I96 Gangrene, not elsewhere classified: Secondary | ICD-10-CM | POA: Diagnosis not present

## 2021-06-19 DIAGNOSIS — Z794 Long term (current) use of insulin: Secondary | ICD-10-CM | POA: Diagnosis not present

## 2021-06-20 DIAGNOSIS — E119 Type 2 diabetes mellitus without complications: Secondary | ICD-10-CM | POA: Diagnosis not present

## 2021-06-20 DIAGNOSIS — M2550 Pain in unspecified joint: Secondary | ICD-10-CM | POA: Diagnosis not present

## 2021-06-20 DIAGNOSIS — Z7984 Long term (current) use of oral hypoglycemic drugs: Secondary | ICD-10-CM | POA: Diagnosis not present

## 2021-06-20 DIAGNOSIS — Z7902 Long term (current) use of antithrombotics/antiplatelets: Secondary | ICD-10-CM | POA: Diagnosis not present

## 2021-06-20 DIAGNOSIS — M898X9 Other specified disorders of bone, unspecified site: Secondary | ICD-10-CM | POA: Diagnosis not present

## 2021-06-20 DIAGNOSIS — M818 Other osteoporosis without current pathological fracture: Secondary | ICD-10-CM | POA: Diagnosis not present

## 2021-06-20 DIAGNOSIS — T380X5A Adverse effect of glucocorticoids and synthetic analogues, initial encounter: Secondary | ICD-10-CM | POA: Diagnosis not present

## 2021-06-20 DIAGNOSIS — Z7982 Long term (current) use of aspirin: Secondary | ICD-10-CM | POA: Diagnosis not present

## 2021-06-20 DIAGNOSIS — D472 Monoclonal gammopathy: Secondary | ICD-10-CM | POA: Diagnosis not present

## 2021-06-20 DIAGNOSIS — D469 Myelodysplastic syndrome, unspecified: Secondary | ICD-10-CM | POA: Diagnosis not present

## 2021-06-20 DIAGNOSIS — Z79899 Other long term (current) drug therapy: Secondary | ICD-10-CM | POA: Diagnosis not present

## 2021-06-22 DIAGNOSIS — Z89412 Acquired absence of left great toe: Secondary | ICD-10-CM | POA: Diagnosis not present

## 2021-06-22 DIAGNOSIS — Z79899 Other long term (current) drug therapy: Secondary | ICD-10-CM | POA: Diagnosis not present

## 2021-06-22 DIAGNOSIS — I70262 Atherosclerosis of native arteries of extremities with gangrene, left leg: Secondary | ICD-10-CM | POA: Diagnosis not present

## 2021-06-22 DIAGNOSIS — M869 Osteomyelitis, unspecified: Secondary | ICD-10-CM | POA: Diagnosis not present

## 2021-06-22 DIAGNOSIS — I96 Gangrene, not elsewhere classified: Secondary | ICD-10-CM | POA: Diagnosis not present

## 2021-06-22 DIAGNOSIS — Z8546 Personal history of malignant neoplasm of prostate: Secondary | ICD-10-CM | POA: Diagnosis not present

## 2021-06-22 DIAGNOSIS — D469 Myelodysplastic syndrome, unspecified: Secondary | ICD-10-CM | POA: Diagnosis not present

## 2021-06-22 DIAGNOSIS — L600 Ingrowing nail: Secondary | ICD-10-CM | POA: Diagnosis not present

## 2021-06-22 DIAGNOSIS — I63231 Cerebral infarction due to unspecified occlusion or stenosis of right carotid arteries: Secondary | ICD-10-CM | POA: Diagnosis not present

## 2021-06-22 DIAGNOSIS — Z86718 Personal history of other venous thrombosis and embolism: Secondary | ICD-10-CM | POA: Diagnosis not present

## 2021-06-22 DIAGNOSIS — E785 Hyperlipidemia, unspecified: Secondary | ICD-10-CM | POA: Diagnosis not present

## 2021-06-22 DIAGNOSIS — K219 Gastro-esophageal reflux disease without esophagitis: Secondary | ICD-10-CM | POA: Diagnosis not present

## 2021-06-22 DIAGNOSIS — L97526 Non-pressure chronic ulcer of other part of left foot with bone involvement without evidence of necrosis: Secondary | ICD-10-CM | POA: Diagnosis not present

## 2021-06-22 DIAGNOSIS — Z91041 Radiographic dye allergy status: Secondary | ICD-10-CM | POA: Diagnosis not present

## 2021-06-22 DIAGNOSIS — Z87891 Personal history of nicotine dependence: Secondary | ICD-10-CM | POA: Diagnosis not present

## 2021-06-22 DIAGNOSIS — E11622 Type 2 diabetes mellitus with other skin ulcer: Secondary | ICD-10-CM | POA: Diagnosis not present

## 2021-06-22 DIAGNOSIS — Z9889 Other specified postprocedural states: Secondary | ICD-10-CM | POA: Diagnosis not present

## 2021-06-26 DIAGNOSIS — R5383 Other fatigue: Secondary | ICD-10-CM | POA: Diagnosis not present

## 2021-06-26 DIAGNOSIS — E559 Vitamin D deficiency, unspecified: Secondary | ICD-10-CM | POA: Diagnosis not present

## 2021-06-26 DIAGNOSIS — Z1329 Encounter for screening for other suspected endocrine disorder: Secondary | ICD-10-CM | POA: Diagnosis not present

## 2021-06-26 DIAGNOSIS — E7801 Familial hypercholesterolemia: Secondary | ICD-10-CM | POA: Diagnosis not present

## 2021-06-26 DIAGNOSIS — E7800 Pure hypercholesterolemia, unspecified: Secondary | ICD-10-CM | POA: Diagnosis not present

## 2021-06-26 DIAGNOSIS — Z0001 Encounter for general adult medical examination with abnormal findings: Secondary | ICD-10-CM | POA: Diagnosis not present

## 2021-06-26 DIAGNOSIS — Z1321 Encounter for screening for nutritional disorder: Secondary | ICD-10-CM | POA: Diagnosis not present

## 2021-06-26 DIAGNOSIS — I10 Essential (primary) hypertension: Secondary | ICD-10-CM | POA: Diagnosis not present

## 2021-06-26 DIAGNOSIS — E1140 Type 2 diabetes mellitus with diabetic neuropathy, unspecified: Secondary | ICD-10-CM | POA: Diagnosis not present

## 2021-06-26 DIAGNOSIS — K219 Gastro-esophageal reflux disease without esophagitis: Secondary | ICD-10-CM | POA: Diagnosis not present

## 2021-06-29 DIAGNOSIS — E1140 Type 2 diabetes mellitus with diabetic neuropathy, unspecified: Secondary | ICD-10-CM | POA: Diagnosis not present

## 2021-06-29 DIAGNOSIS — R7989 Other specified abnormal findings of blood chemistry: Secondary | ICD-10-CM | POA: Diagnosis not present

## 2021-06-29 DIAGNOSIS — M1120 Other chondrocalcinosis, unspecified site: Secondary | ICD-10-CM | POA: Diagnosis not present

## 2021-06-29 DIAGNOSIS — I739 Peripheral vascular disease, unspecified: Secondary | ICD-10-CM | POA: Diagnosis not present

## 2021-06-29 DIAGNOSIS — I6529 Occlusion and stenosis of unspecified carotid artery: Secondary | ICD-10-CM | POA: Diagnosis not present

## 2021-06-29 DIAGNOSIS — B0229 Other postherpetic nervous system involvement: Secondary | ICD-10-CM | POA: Diagnosis not present

## 2021-06-29 DIAGNOSIS — Z8673 Personal history of transient ischemic attack (TIA), and cerebral infarction without residual deficits: Secondary | ICD-10-CM | POA: Diagnosis not present

## 2021-07-02 DIAGNOSIS — Z79899 Other long term (current) drug therapy: Secondary | ICD-10-CM | POA: Diagnosis not present

## 2021-07-02 DIAGNOSIS — I70291 Other atherosclerosis of native arteries of extremities, right leg: Secondary | ICD-10-CM | POA: Diagnosis not present

## 2021-07-02 DIAGNOSIS — Z95820 Peripheral vascular angioplasty status with implants and grafts: Secondary | ICD-10-CM | POA: Diagnosis not present

## 2021-07-02 DIAGNOSIS — Z86718 Personal history of other venous thrombosis and embolism: Secondary | ICD-10-CM | POA: Diagnosis not present

## 2021-07-02 DIAGNOSIS — Z7982 Long term (current) use of aspirin: Secondary | ICD-10-CM | POA: Diagnosis not present

## 2021-07-02 DIAGNOSIS — E1151 Type 2 diabetes mellitus with diabetic peripheral angiopathy without gangrene: Secondary | ICD-10-CM | POA: Diagnosis not present

## 2021-07-02 DIAGNOSIS — Z7952 Long term (current) use of systemic steroids: Secondary | ICD-10-CM | POA: Diagnosis not present

## 2021-07-02 DIAGNOSIS — Z7902 Long term (current) use of antithrombotics/antiplatelets: Secondary | ICD-10-CM | POA: Diagnosis not present

## 2021-07-02 DIAGNOSIS — Z89412 Acquired absence of left great toe: Secondary | ICD-10-CM | POA: Diagnosis not present

## 2021-07-02 DIAGNOSIS — Z20822 Contact with and (suspected) exposure to covid-19: Secondary | ICD-10-CM | POA: Diagnosis not present

## 2021-07-02 DIAGNOSIS — Z7984 Long term (current) use of oral hypoglycemic drugs: Secondary | ICD-10-CM | POA: Diagnosis not present

## 2021-07-04 DIAGNOSIS — R7989 Other specified abnormal findings of blood chemistry: Secondary | ICD-10-CM | POA: Diagnosis not present

## 2021-07-10 DIAGNOSIS — I96 Gangrene, not elsewhere classified: Secondary | ICD-10-CM | POA: Diagnosis not present

## 2021-07-10 DIAGNOSIS — Z7952 Long term (current) use of systemic steroids: Secondary | ICD-10-CM | POA: Diagnosis not present

## 2021-07-10 DIAGNOSIS — I739 Peripheral vascular disease, unspecified: Secondary | ICD-10-CM | POA: Diagnosis not present

## 2021-07-10 DIAGNOSIS — Z794 Long term (current) use of insulin: Secondary | ICD-10-CM | POA: Diagnosis not present

## 2021-07-10 DIAGNOSIS — L600 Ingrowing nail: Secondary | ICD-10-CM | POA: Diagnosis not present

## 2021-07-10 DIAGNOSIS — Z7902 Long term (current) use of antithrombotics/antiplatelets: Secondary | ICD-10-CM | POA: Diagnosis not present

## 2021-07-10 DIAGNOSIS — M1120 Other chondrocalcinosis, unspecified site: Secondary | ICD-10-CM | POA: Diagnosis not present

## 2021-07-10 DIAGNOSIS — M79672 Pain in left foot: Secondary | ICD-10-CM | POA: Diagnosis not present

## 2021-07-10 DIAGNOSIS — M179 Osteoarthritis of knee, unspecified: Secondary | ICD-10-CM | POA: Diagnosis not present

## 2021-07-10 DIAGNOSIS — K8020 Calculus of gallbladder without cholecystitis without obstruction: Secondary | ICD-10-CM | POA: Diagnosis not present

## 2021-07-10 DIAGNOSIS — Z7984 Long term (current) use of oral hypoglycemic drugs: Secondary | ICD-10-CM | POA: Diagnosis not present

## 2021-07-10 DIAGNOSIS — Z89412 Acquired absence of left great toe: Secondary | ICD-10-CM | POA: Diagnosis not present

## 2021-07-10 DIAGNOSIS — E119 Type 2 diabetes mellitus without complications: Secondary | ICD-10-CM | POA: Diagnosis not present

## 2021-07-10 DIAGNOSIS — Z792 Long term (current) use of antibiotics: Secondary | ICD-10-CM | POA: Diagnosis not present

## 2021-07-10 DIAGNOSIS — M19042 Primary osteoarthritis, left hand: Secondary | ICD-10-CM | POA: Diagnosis not present

## 2021-07-10 DIAGNOSIS — M19041 Primary osteoarthritis, right hand: Secondary | ICD-10-CM | POA: Diagnosis not present

## 2021-07-10 DIAGNOSIS — Z8739 Personal history of other diseases of the musculoskeletal system and connective tissue: Secondary | ICD-10-CM | POA: Diagnosis not present

## 2021-07-10 DIAGNOSIS — Z89422 Acquired absence of other left toe(s): Secondary | ICD-10-CM | POA: Diagnosis not present

## 2021-07-10 DIAGNOSIS — Z7982 Long term (current) use of aspirin: Secondary | ICD-10-CM | POA: Diagnosis not present

## 2021-07-11 DIAGNOSIS — C61 Malignant neoplasm of prostate: Secondary | ICD-10-CM | POA: Diagnosis not present

## 2021-07-11 DIAGNOSIS — D638 Anemia in other chronic diseases classified elsewhere: Secondary | ICD-10-CM | POA: Diagnosis not present

## 2021-07-11 DIAGNOSIS — D469 Myelodysplastic syndrome, unspecified: Secondary | ICD-10-CM | POA: Diagnosis not present

## 2021-07-11 DIAGNOSIS — M9272 Juvenile osteochondrosis of metatarsus, left foot: Secondary | ICD-10-CM | POA: Diagnosis not present

## 2021-07-11 DIAGNOSIS — Z1501 Genetic susceptibility to malignant neoplasm of breast: Secondary | ICD-10-CM | POA: Diagnosis not present

## 2021-07-11 DIAGNOSIS — D472 Monoclonal gammopathy: Secondary | ICD-10-CM | POA: Diagnosis not present

## 2021-07-12 DIAGNOSIS — Z7952 Long term (current) use of systemic steroids: Secondary | ICD-10-CM | POA: Diagnosis not present

## 2021-07-12 DIAGNOSIS — Z96653 Presence of artificial knee joint, bilateral: Secondary | ICD-10-CM | POA: Diagnosis not present

## 2021-07-12 DIAGNOSIS — T380X5A Adverse effect of glucocorticoids and synthetic analogues, initial encounter: Secondary | ICD-10-CM | POA: Diagnosis not present

## 2021-07-12 DIAGNOSIS — E119 Type 2 diabetes mellitus without complications: Secondary | ICD-10-CM | POA: Diagnosis not present

## 2021-07-12 DIAGNOSIS — M818 Other osteoporosis without current pathological fracture: Secondary | ICD-10-CM | POA: Diagnosis not present

## 2021-07-12 DIAGNOSIS — Z8546 Personal history of malignant neoplasm of prostate: Secondary | ICD-10-CM | POA: Diagnosis not present

## 2021-07-12 DIAGNOSIS — M898X9 Other specified disorders of bone, unspecified site: Secondary | ICD-10-CM | POA: Diagnosis not present

## 2021-07-12 DIAGNOSIS — Z7982 Long term (current) use of aspirin: Secondary | ICD-10-CM | POA: Diagnosis not present

## 2021-07-12 DIAGNOSIS — Z86718 Personal history of other venous thrombosis and embolism: Secondary | ICD-10-CM | POA: Diagnosis not present

## 2021-07-12 DIAGNOSIS — M2550 Pain in unspecified joint: Secondary | ICD-10-CM | POA: Diagnosis not present

## 2021-07-12 DIAGNOSIS — Z89422 Acquired absence of other left toe(s): Secondary | ICD-10-CM | POA: Diagnosis not present

## 2021-07-12 DIAGNOSIS — Z8673 Personal history of transient ischemic attack (TIA), and cerebral infarction without residual deficits: Secondary | ICD-10-CM | POA: Diagnosis not present

## 2021-07-12 DIAGNOSIS — D472 Monoclonal gammopathy: Secondary | ICD-10-CM | POA: Diagnosis not present

## 2021-07-12 DIAGNOSIS — D469 Myelodysplastic syndrome, unspecified: Secondary | ICD-10-CM | POA: Diagnosis not present

## 2021-07-12 DIAGNOSIS — Z9079 Acquired absence of other genital organ(s): Secondary | ICD-10-CM | POA: Diagnosis not present

## 2021-07-12 DIAGNOSIS — Z7902 Long term (current) use of antithrombotics/antiplatelets: Secondary | ICD-10-CM | POA: Diagnosis not present

## 2021-07-12 DIAGNOSIS — R918 Other nonspecific abnormal finding of lung field: Secondary | ICD-10-CM | POA: Diagnosis not present

## 2021-07-13 DIAGNOSIS — I739 Peripheral vascular disease, unspecified: Secondary | ICD-10-CM | POA: Diagnosis not present

## 2021-07-13 DIAGNOSIS — I6522 Occlusion and stenosis of left carotid artery: Secondary | ICD-10-CM | POA: Diagnosis not present

## 2021-07-13 DIAGNOSIS — L97521 Non-pressure chronic ulcer of other part of left foot limited to breakdown of skin: Secondary | ICD-10-CM | POA: Diagnosis not present

## 2021-07-13 DIAGNOSIS — I6523 Occlusion and stenosis of bilateral carotid arteries: Secondary | ICD-10-CM | POA: Diagnosis not present

## 2021-07-19 DIAGNOSIS — K8020 Calculus of gallbladder without cholecystitis without obstruction: Secondary | ICD-10-CM | POA: Diagnosis not present

## 2021-07-24 DIAGNOSIS — I739 Peripheral vascular disease, unspecified: Secondary | ICD-10-CM | POA: Diagnosis not present

## 2021-07-24 DIAGNOSIS — Z9889 Other specified postprocedural states: Secondary | ICD-10-CM | POA: Diagnosis not present

## 2021-07-24 DIAGNOSIS — L600 Ingrowing nail: Secondary | ICD-10-CM | POA: Diagnosis not present

## 2021-07-24 DIAGNOSIS — Z89422 Acquired absence of other left toe(s): Secondary | ICD-10-CM | POA: Diagnosis not present

## 2021-07-24 DIAGNOSIS — E119 Type 2 diabetes mellitus without complications: Secondary | ICD-10-CM | POA: Diagnosis not present

## 2021-07-24 DIAGNOSIS — Z89412 Acquired absence of left great toe: Secondary | ICD-10-CM | POA: Diagnosis not present

## 2021-07-24 DIAGNOSIS — E1152 Type 2 diabetes mellitus with diabetic peripheral angiopathy with gangrene: Secondary | ICD-10-CM | POA: Diagnosis not present

## 2021-07-24 DIAGNOSIS — Z794 Long term (current) use of insulin: Secondary | ICD-10-CM | POA: Diagnosis not present

## 2021-07-24 DIAGNOSIS — M79672 Pain in left foot: Secondary | ICD-10-CM | POA: Diagnosis not present

## 2021-07-25 DIAGNOSIS — L570 Actinic keratosis: Secondary | ICD-10-CM | POA: Diagnosis not present

## 2021-07-26 DIAGNOSIS — I10 Essential (primary) hypertension: Secondary | ICD-10-CM | POA: Diagnosis not present

## 2021-07-26 DIAGNOSIS — K219 Gastro-esophageal reflux disease without esophagitis: Secondary | ICD-10-CM | POA: Diagnosis not present

## 2021-08-01 DIAGNOSIS — K5900 Constipation, unspecified: Secondary | ICD-10-CM | POA: Diagnosis not present

## 2021-08-01 DIAGNOSIS — R5383 Other fatigue: Secondary | ICD-10-CM | POA: Diagnosis not present

## 2021-08-01 DIAGNOSIS — D638 Anemia in other chronic diseases classified elsewhere: Secondary | ICD-10-CM | POA: Diagnosis not present

## 2021-08-01 DIAGNOSIS — R0602 Shortness of breath: Secondary | ICD-10-CM | POA: Diagnosis not present

## 2021-08-01 DIAGNOSIS — Z8546 Personal history of malignant neoplasm of prostate: Secondary | ICD-10-CM | POA: Diagnosis not present

## 2021-08-01 DIAGNOSIS — D469 Myelodysplastic syndrome, unspecified: Secondary | ICD-10-CM | POA: Diagnosis not present

## 2021-08-01 DIAGNOSIS — C61 Malignant neoplasm of prostate: Secondary | ICD-10-CM | POA: Diagnosis not present

## 2021-08-01 DIAGNOSIS — D472 Monoclonal gammopathy: Secondary | ICD-10-CM | POA: Diagnosis not present

## 2021-08-01 DIAGNOSIS — M9272 Juvenile osteochondrosis of metatarsus, left foot: Secondary | ICD-10-CM | POA: Diagnosis not present

## 2021-08-01 DIAGNOSIS — R200 Anesthesia of skin: Secondary | ICD-10-CM | POA: Diagnosis not present

## 2021-08-01 DIAGNOSIS — Z79630 Long term (current) use of alkylating agent: Secondary | ICD-10-CM | POA: Diagnosis not present

## 2021-08-01 DIAGNOSIS — Z9079 Acquired absence of other genital organ(s): Secondary | ICD-10-CM | POA: Diagnosis not present

## 2021-08-01 DIAGNOSIS — B0229 Other postherpetic nervous system involvement: Secondary | ICD-10-CM | POA: Diagnosis not present

## 2021-08-02 DIAGNOSIS — D469 Myelodysplastic syndrome, unspecified: Secondary | ICD-10-CM | POA: Diagnosis not present

## 2021-08-02 DIAGNOSIS — T380X5A Adverse effect of glucocorticoids and synthetic analogues, initial encounter: Secondary | ICD-10-CM | POA: Diagnosis not present

## 2021-08-02 DIAGNOSIS — M810 Age-related osteoporosis without current pathological fracture: Secondary | ICD-10-CM | POA: Diagnosis not present

## 2021-08-07 DIAGNOSIS — M1120 Other chondrocalcinosis, unspecified site: Secondary | ICD-10-CM | POA: Diagnosis not present

## 2021-08-07 DIAGNOSIS — Z8673 Personal history of transient ischemic attack (TIA), and cerebral infarction without residual deficits: Secondary | ICD-10-CM | POA: Diagnosis not present

## 2021-08-07 DIAGNOSIS — I739 Peripheral vascular disease, unspecified: Secondary | ICD-10-CM | POA: Diagnosis not present

## 2021-08-07 DIAGNOSIS — E1140 Type 2 diabetes mellitus with diabetic neuropathy, unspecified: Secondary | ICD-10-CM | POA: Diagnosis not present

## 2021-08-07 DIAGNOSIS — I6529 Occlusion and stenosis of unspecified carotid artery: Secondary | ICD-10-CM | POA: Diagnosis not present

## 2021-08-07 DIAGNOSIS — D649 Anemia, unspecified: Secondary | ICD-10-CM | POA: Diagnosis not present

## 2021-08-07 DIAGNOSIS — R7989 Other specified abnormal findings of blood chemistry: Secondary | ICD-10-CM | POA: Diagnosis not present

## 2021-08-22 DIAGNOSIS — D638 Anemia in other chronic diseases classified elsewhere: Secondary | ICD-10-CM | POA: Diagnosis not present

## 2021-08-22 DIAGNOSIS — F5089 Other specified eating disorder: Secondary | ICD-10-CM | POA: Diagnosis not present

## 2021-08-22 DIAGNOSIS — C61 Malignant neoplasm of prostate: Secondary | ICD-10-CM | POA: Diagnosis not present

## 2021-08-22 DIAGNOSIS — B0229 Other postherpetic nervous system involvement: Secondary | ICD-10-CM | POA: Diagnosis not present

## 2021-08-22 DIAGNOSIS — D472 Monoclonal gammopathy: Secondary | ICD-10-CM | POA: Diagnosis not present

## 2021-08-22 DIAGNOSIS — R7989 Other specified abnormal findings of blood chemistry: Secondary | ICD-10-CM | POA: Diagnosis not present

## 2021-08-22 DIAGNOSIS — M1990 Unspecified osteoarthritis, unspecified site: Secondary | ICD-10-CM | POA: Diagnosis not present

## 2021-08-22 DIAGNOSIS — D469 Myelodysplastic syndrome, unspecified: Secondary | ICD-10-CM | POA: Diagnosis not present

## 2021-08-23 DIAGNOSIS — D469 Myelodysplastic syndrome, unspecified: Secondary | ICD-10-CM | POA: Diagnosis not present

## 2021-08-23 DIAGNOSIS — M818 Other osteoporosis without current pathological fracture: Secondary | ICD-10-CM | POA: Diagnosis not present

## 2021-08-23 DIAGNOSIS — T380X5A Adverse effect of glucocorticoids and synthetic analogues, initial encounter: Secondary | ICD-10-CM | POA: Diagnosis not present

## 2021-09-04 DIAGNOSIS — I96 Gangrene, not elsewhere classified: Secondary | ICD-10-CM | POA: Diagnosis not present

## 2021-09-04 DIAGNOSIS — E119 Type 2 diabetes mellitus without complications: Secondary | ICD-10-CM | POA: Diagnosis not present

## 2021-09-04 DIAGNOSIS — E1151 Type 2 diabetes mellitus with diabetic peripheral angiopathy without gangrene: Secondary | ICD-10-CM | POA: Diagnosis not present

## 2021-09-04 DIAGNOSIS — Z794 Long term (current) use of insulin: Secondary | ICD-10-CM | POA: Diagnosis not present

## 2021-09-04 DIAGNOSIS — M79672 Pain in left foot: Secondary | ICD-10-CM | POA: Diagnosis not present

## 2021-09-04 DIAGNOSIS — I69331 Monoplegia of upper limb following cerebral infarction affecting right dominant side: Secondary | ICD-10-CM | POA: Diagnosis not present

## 2021-09-04 DIAGNOSIS — B351 Tinea unguium: Secondary | ICD-10-CM | POA: Diagnosis not present

## 2021-09-04 DIAGNOSIS — Z89412 Acquired absence of left great toe: Secondary | ICD-10-CM | POA: Diagnosis not present

## 2021-09-04 DIAGNOSIS — I69334 Monoplegia of upper limb following cerebral infarction affecting left non-dominant side: Secondary | ICD-10-CM | POA: Diagnosis not present

## 2021-09-04 DIAGNOSIS — L600 Ingrowing nail: Secondary | ICD-10-CM | POA: Diagnosis not present

## 2021-09-11 DIAGNOSIS — K2901 Acute gastritis with bleeding: Secondary | ICD-10-CM | POA: Diagnosis not present

## 2021-09-11 DIAGNOSIS — D469 Myelodysplastic syndrome, unspecified: Secondary | ICD-10-CM | POA: Diagnosis not present

## 2021-09-11 DIAGNOSIS — D472 Monoclonal gammopathy: Secondary | ICD-10-CM | POA: Diagnosis not present

## 2021-09-11 DIAGNOSIS — K633 Ulcer of intestine: Secondary | ICD-10-CM | POA: Diagnosis not present

## 2021-09-11 DIAGNOSIS — D649 Anemia, unspecified: Secondary | ICD-10-CM | POA: Diagnosis not present

## 2021-09-11 DIAGNOSIS — R1031 Right lower quadrant pain: Secondary | ICD-10-CM | POA: Diagnosis not present

## 2021-09-11 DIAGNOSIS — F5089 Other specified eating disorder: Secondary | ICD-10-CM | POA: Diagnosis not present

## 2021-09-11 DIAGNOSIS — M9272 Juvenile osteochondrosis of metatarsus, left foot: Secondary | ICD-10-CM | POA: Diagnosis not present

## 2021-09-11 DIAGNOSIS — I959 Hypotension, unspecified: Secondary | ICD-10-CM | POA: Diagnosis not present

## 2021-09-11 DIAGNOSIS — C61 Malignant neoplasm of prostate: Secondary | ICD-10-CM | POA: Diagnosis not present

## 2021-09-11 DIAGNOSIS — B0229 Other postherpetic nervous system involvement: Secondary | ICD-10-CM | POA: Diagnosis not present

## 2021-09-13 DIAGNOSIS — T380X5A Adverse effect of glucocorticoids and synthetic analogues, initial encounter: Secondary | ICD-10-CM | POA: Diagnosis not present

## 2021-09-13 DIAGNOSIS — M818 Other osteoporosis without current pathological fracture: Secondary | ICD-10-CM | POA: Diagnosis not present

## 2021-09-13 DIAGNOSIS — D469 Myelodysplastic syndrome, unspecified: Secondary | ICD-10-CM | POA: Diagnosis not present

## 2021-09-14 DIAGNOSIS — Z0389 Encounter for observation for other suspected diseases and conditions ruled out: Secondary | ICD-10-CM | POA: Diagnosis not present

## 2021-09-14 DIAGNOSIS — C61 Malignant neoplasm of prostate: Secondary | ICD-10-CM | POA: Diagnosis not present

## 2021-09-14 DIAGNOSIS — R1031 Right lower quadrant pain: Secondary | ICD-10-CM | POA: Diagnosis not present

## 2021-10-02 DIAGNOSIS — T380X5A Adverse effect of glucocorticoids and synthetic analogues, initial encounter: Secondary | ICD-10-CM | POA: Diagnosis not present

## 2021-10-02 DIAGNOSIS — D472 Monoclonal gammopathy: Secondary | ICD-10-CM | POA: Diagnosis not present

## 2021-10-02 DIAGNOSIS — B0229 Other postherpetic nervous system involvement: Secondary | ICD-10-CM | POA: Diagnosis not present

## 2021-10-02 DIAGNOSIS — C61 Malignant neoplasm of prostate: Secondary | ICD-10-CM | POA: Diagnosis not present

## 2021-10-02 DIAGNOSIS — D638 Anemia in other chronic diseases classified elsewhere: Secondary | ICD-10-CM | POA: Diagnosis not present

## 2021-10-02 DIAGNOSIS — D469 Myelodysplastic syndrome, unspecified: Secondary | ICD-10-CM | POA: Diagnosis not present

## 2021-10-02 DIAGNOSIS — M818 Other osteoporosis without current pathological fracture: Secondary | ICD-10-CM | POA: Diagnosis not present

## 2021-10-02 DIAGNOSIS — R7989 Other specified abnormal findings of blood chemistry: Secondary | ICD-10-CM | POA: Diagnosis not present

## 2021-10-02 DIAGNOSIS — Z1501 Genetic susceptibility to malignant neoplasm of breast: Secondary | ICD-10-CM | POA: Diagnosis not present

## 2021-10-15 ENCOUNTER — Ambulatory Visit: Payer: Self-pay | Admitting: *Deleted

## 2021-10-15 ENCOUNTER — Encounter: Payer: Self-pay | Admitting: *Deleted

## 2021-10-15 DIAGNOSIS — I10 Essential (primary) hypertension: Secondary | ICD-10-CM | POA: Diagnosis not present

## 2021-10-15 DIAGNOSIS — E1169 Type 2 diabetes mellitus with other specified complication: Secondary | ICD-10-CM | POA: Diagnosis not present

## 2021-10-15 DIAGNOSIS — G629 Polyneuropathy, unspecified: Secondary | ICD-10-CM | POA: Diagnosis not present

## 2021-10-15 NOTE — Patient Outreach (Signed)
  Care Coordination   Initial Visit Note   10/15/2021 Name: David Joseph MRN: [id number] DOB: 1941-02-20  David Joseph is a 81 y.o. year old male who sees David Nation, MD for primary care. I spoke with  David Joseph by phone today.  What matters to the patients health and wellness today?  No Interventions Indicated.    Goals Addressed   None     SDOH assessments and interventions completed:   Yes SDOH Interventions Today    Flowsheet Row Most Recent Value  SDOH Interventions   Food Insecurity Interventions Intervention Not Indicated  Financial Strain Interventions Intervention Not Indicated  Housing Interventions Intervention Not Indicated  Physical Activity Interventions Intervention Not Indicated  Stress Interventions Intervention Not Indicated  Social Connections Interventions Intervention Not Indicated  Transportation Interventions Intervention Not Indicated       Care Coordination Interventions Activated:  No  Care Coordination Interventions:  No, not indicated.  Follow up plan: No further intervention required.  Encounter Outcome:  Pt. Visit Completed.  David Joseph, BSW, MSW, LCSW  Licensed Education officer, [facility]  Mailing [place] [address] [place], [place] [address] Physical Address-[address], [place], [place] [address] Toll Free Main # [phone] Fax # [phone] Cell # [phone] [email or web address]

## 2021-10-15 NOTE — Patient Instructions (Signed)
Visit Information  Thank you for taking time to visit with me today. Please don't hesitate to contact me if I can be of assistance to you.   Please call the care guide team at [phone] if you need to cancel or reschedule your appointment.   If you are experiencing a Mental Health or Behavioral Health Crisis or need someone to talk to, please call the Suicide and Crisis Lifeline: 988 call the [place] National Suicide Prevention Lifeline: 1-800-273-8255 or TTY: 1-800-799-4 TTY (1-800-799-4889) to talk to a trained counselor call 1-800-273-TALK (toll free, 24 hour hotline) go to [facility] [address], [place] ([phone]) call the [place] Crisis Line: 800-939-9988 call 911  Patient verbalizes understanding of instructions and care plan provided today and agrees to view in MyChart. Active MyChart status and patient understanding of how to access instructions and care plan via MyChart confirmed with patient.     No further follow up required.  Shyan Scalisi, BSW, MSW, LCSW  Licensed Clinical Social Worker  [facility] [place] [facility]  Mailing Address-[address], [place], [place] [address] Physical Address-[address], [place], [place] [address] Toll Free Main # [phone] Fax # [phone] Cell # [phone] [email or web address]            

## 2021-10-22 DIAGNOSIS — D469 Myelodysplastic syndrome, unspecified: Secondary | ICD-10-CM | POA: Diagnosis not present

## 2021-10-22 DIAGNOSIS — Z1501 Genetic susceptibility to malignant neoplasm of breast: Secondary | ICD-10-CM | POA: Diagnosis not present

## 2021-10-22 DIAGNOSIS — R7989 Other specified abnormal findings of blood chemistry: Secondary | ICD-10-CM | POA: Diagnosis not present

## 2021-10-22 DIAGNOSIS — B0229 Other postherpetic nervous system involvement: Secondary | ICD-10-CM | POA: Diagnosis not present

## 2021-10-22 DIAGNOSIS — D649 Anemia, unspecified: Secondary | ICD-10-CM | POA: Diagnosis not present

## 2021-10-22 DIAGNOSIS — C61 Malignant neoplasm of prostate: Secondary | ICD-10-CM | POA: Diagnosis not present

## 2021-10-22 DIAGNOSIS — D472 Monoclonal gammopathy: Secondary | ICD-10-CM | POA: Diagnosis not present

## 2021-10-23 DIAGNOSIS — M818 Other osteoporosis without current pathological fracture: Secondary | ICD-10-CM | POA: Diagnosis not present

## 2021-10-23 DIAGNOSIS — T380X5A Adverse effect of glucocorticoids and synthetic analogues, initial encounter: Secondary | ICD-10-CM | POA: Diagnosis not present

## 2021-10-23 DIAGNOSIS — D469 Myelodysplastic syndrome, unspecified: Secondary | ICD-10-CM | POA: Diagnosis not present

## 2021-10-30 DIAGNOSIS — Z89412 Acquired absence of left great toe: Secondary | ICD-10-CM | POA: Diagnosis not present

## 2021-10-30 DIAGNOSIS — Z8619 Personal history of other infectious and parasitic diseases: Secondary | ICD-10-CM | POA: Diagnosis not present

## 2021-10-30 DIAGNOSIS — L603 Nail dystrophy: Secondary | ICD-10-CM | POA: Diagnosis not present

## 2021-10-30 DIAGNOSIS — Z95820 Peripheral vascular angioplasty status with implants and grafts: Secondary | ICD-10-CM | POA: Diagnosis not present

## 2021-10-30 DIAGNOSIS — M79674 Pain in right toe(s): Secondary | ICD-10-CM | POA: Diagnosis not present

## 2021-10-30 DIAGNOSIS — E1151 Type 2 diabetes mellitus with diabetic peripheral angiopathy without gangrene: Secondary | ICD-10-CM | POA: Diagnosis not present

## 2021-10-30 DIAGNOSIS — L600 Ingrowing nail: Secondary | ICD-10-CM | POA: Diagnosis not present

## 2021-10-30 DIAGNOSIS — Z8546 Personal history of malignant neoplasm of prostate: Secondary | ICD-10-CM | POA: Diagnosis not present

## 2021-10-30 DIAGNOSIS — M79672 Pain in left foot: Secondary | ICD-10-CM | POA: Diagnosis not present

## 2021-10-30 DIAGNOSIS — B351 Tinea unguium: Secondary | ICD-10-CM | POA: Diagnosis not present

## 2021-10-30 DIAGNOSIS — I69359 Hemiplegia and hemiparesis following cerebral infarction affecting unspecified side: Secondary | ICD-10-CM | POA: Diagnosis not present

## 2021-10-31 DIAGNOSIS — E1140 Type 2 diabetes mellitus with diabetic neuropathy, unspecified: Secondary | ICD-10-CM | POA: Diagnosis not present

## 2021-10-31 DIAGNOSIS — R739 Hyperglycemia, unspecified: Secondary | ICD-10-CM | POA: Diagnosis not present

## 2021-11-05 DIAGNOSIS — D485 Neoplasm of uncertain behavior of skin: Secondary | ICD-10-CM | POA: Diagnosis not present

## 2021-11-05 DIAGNOSIS — L570 Actinic keratosis: Secondary | ICD-10-CM | POA: Diagnosis not present

## 2021-11-05 DIAGNOSIS — D044 Carcinoma in situ of skin of scalp and neck: Secondary | ICD-10-CM | POA: Diagnosis not present

## 2021-11-05 DIAGNOSIS — D0439 Carcinoma in situ of skin of other parts of face: Secondary | ICD-10-CM | POA: Diagnosis not present

## 2021-11-07 DIAGNOSIS — Z8673 Personal history of transient ischemic attack (TIA), and cerebral infarction without residual deficits: Secondary | ICD-10-CM | POA: Diagnosis not present

## 2021-11-07 DIAGNOSIS — M1120 Other chondrocalcinosis, unspecified site: Secondary | ICD-10-CM | POA: Diagnosis not present

## 2021-11-07 DIAGNOSIS — I739 Peripheral vascular disease, unspecified: Secondary | ICD-10-CM | POA: Diagnosis not present

## 2021-11-07 DIAGNOSIS — D649 Anemia, unspecified: Secondary | ICD-10-CM | POA: Diagnosis not present

## 2021-11-07 DIAGNOSIS — I6529 Occlusion and stenosis of unspecified carotid artery: Secondary | ICD-10-CM | POA: Diagnosis not present

## 2021-11-07 DIAGNOSIS — B0229 Other postherpetic nervous system involvement: Secondary | ICD-10-CM | POA: Diagnosis not present

## 2021-11-07 DIAGNOSIS — R030 Elevated blood-pressure reading, without diagnosis of hypertension: Secondary | ICD-10-CM | POA: Diagnosis not present

## 2021-11-07 DIAGNOSIS — E1140 Type 2 diabetes mellitus with diabetic neuropathy, unspecified: Secondary | ICD-10-CM | POA: Diagnosis not present

## 2021-11-07 DIAGNOSIS — G629 Polyneuropathy, unspecified: Secondary | ICD-10-CM | POA: Diagnosis not present

## 2021-11-07 DIAGNOSIS — Z6822 Body mass index (BMI) 22.0-22.9, adult: Secondary | ICD-10-CM | POA: Diagnosis not present

## 2021-11-07 DIAGNOSIS — R7989 Other specified abnormal findings of blood chemistry: Secondary | ICD-10-CM | POA: Diagnosis not present

## 2021-11-15 DIAGNOSIS — E1169 Type 2 diabetes mellitus with other specified complication: Secondary | ICD-10-CM | POA: Diagnosis not present

## 2021-11-15 DIAGNOSIS — K219 Gastro-esophageal reflux disease without esophagitis: Secondary | ICD-10-CM | POA: Diagnosis not present

## 2021-11-15 DIAGNOSIS — E782 Mixed hyperlipidemia: Secondary | ICD-10-CM | POA: Diagnosis not present

## 2021-11-20 DIAGNOSIS — M1120 Other chondrocalcinosis, unspecified site: Secondary | ICD-10-CM | POA: Diagnosis not present

## 2021-11-20 DIAGNOSIS — C61 Malignant neoplasm of prostate: Secondary | ICD-10-CM | POA: Diagnosis not present

## 2021-11-20 DIAGNOSIS — D472 Monoclonal gammopathy: Secondary | ICD-10-CM | POA: Diagnosis not present

## 2021-11-20 DIAGNOSIS — D638 Anemia in other chronic diseases classified elsewhere: Secondary | ICD-10-CM | POA: Diagnosis not present

## 2021-11-20 DIAGNOSIS — D469 Myelodysplastic syndrome, unspecified: Secondary | ICD-10-CM | POA: Diagnosis not present

## 2021-11-21 DIAGNOSIS — Z79899 Other long term (current) drug therapy: Secondary | ICD-10-CM | POA: Diagnosis not present

## 2021-11-21 DIAGNOSIS — D469 Myelodysplastic syndrome, unspecified: Secondary | ICD-10-CM | POA: Diagnosis not present

## 2021-11-21 DIAGNOSIS — Z7984 Long term (current) use of oral hypoglycemic drugs: Secondary | ICD-10-CM | POA: Diagnosis not present

## 2021-11-21 DIAGNOSIS — Z7902 Long term (current) use of antithrombotics/antiplatelets: Secondary | ICD-10-CM | POA: Diagnosis not present

## 2021-11-21 DIAGNOSIS — Z7982 Long term (current) use of aspirin: Secondary | ICD-10-CM | POA: Diagnosis not present

## 2021-11-21 DIAGNOSIS — E1151 Type 2 diabetes mellitus with diabetic peripheral angiopathy without gangrene: Secondary | ICD-10-CM | POA: Diagnosis not present

## 2021-11-21 DIAGNOSIS — Z5181 Encounter for therapeutic drug level monitoring: Secondary | ICD-10-CM | POA: Diagnosis not present

## 2021-12-01 DIAGNOSIS — Z955 Presence of coronary angioplasty implant and graft: Secondary | ICD-10-CM | POA: Diagnosis not present

## 2021-12-01 DIAGNOSIS — G4489 Other headache syndrome: Secondary | ICD-10-CM | POA: Diagnosis not present

## 2021-12-01 DIAGNOSIS — I5021 Acute systolic (congestive) heart failure: Secondary | ICD-10-CM | POA: Diagnosis present

## 2021-12-01 DIAGNOSIS — Z87891 Personal history of nicotine dependence: Secondary | ICD-10-CM | POA: Diagnosis not present

## 2021-12-01 DIAGNOSIS — I2510 Atherosclerotic heart disease of native coronary artery without angina pectoris: Secondary | ICD-10-CM | POA: Diagnosis present

## 2021-12-01 DIAGNOSIS — Z9981 Dependence on supplemental oxygen: Secondary | ICD-10-CM | POA: Diagnosis not present

## 2021-12-01 DIAGNOSIS — I77819 Aortic ectasia, unspecified site: Secondary | ICD-10-CM | POA: Diagnosis not present

## 2021-12-01 DIAGNOSIS — Z91041 Radiographic dye allergy status: Secondary | ICD-10-CM | POA: Diagnosis not present

## 2021-12-01 DIAGNOSIS — M1120 Other chondrocalcinosis, unspecified site: Secondary | ICD-10-CM | POA: Diagnosis not present

## 2021-12-01 DIAGNOSIS — Z794 Long term (current) use of insulin: Secondary | ICD-10-CM | POA: Diagnosis not present

## 2021-12-01 DIAGNOSIS — D500 Iron deficiency anemia secondary to blood loss (chronic): Secondary | ICD-10-CM | POA: Diagnosis not present

## 2021-12-01 DIAGNOSIS — R778 Other specified abnormalities of plasma proteins: Secondary | ICD-10-CM | POA: Diagnosis not present

## 2021-12-01 DIAGNOSIS — I4891 Unspecified atrial fibrillation: Secondary | ICD-10-CM | POA: Diagnosis present

## 2021-12-01 DIAGNOSIS — R739 Hyperglycemia, unspecified: Secondary | ICD-10-CM | POA: Diagnosis not present

## 2021-12-01 DIAGNOSIS — R7989 Other specified abnormal findings of blood chemistry: Secondary | ICD-10-CM | POA: Diagnosis not present

## 2021-12-01 DIAGNOSIS — Z7902 Long term (current) use of antithrombotics/antiplatelets: Secondary | ICD-10-CM | POA: Diagnosis not present

## 2021-12-01 DIAGNOSIS — I088 Other rheumatic multiple valve diseases: Secondary | ICD-10-CM | POA: Diagnosis present

## 2021-12-01 DIAGNOSIS — D638 Anemia in other chronic diseases classified elsewhere: Secondary | ICD-10-CM | POA: Diagnosis present

## 2021-12-01 DIAGNOSIS — Z7982 Long term (current) use of aspirin: Secondary | ICD-10-CM | POA: Diagnosis not present

## 2021-12-01 DIAGNOSIS — I5020 Unspecified systolic (congestive) heart failure: Secondary | ICD-10-CM | POA: Diagnosis not present

## 2021-12-01 DIAGNOSIS — Z95828 Presence of other vascular implants and grafts: Secondary | ICD-10-CM | POA: Diagnosis not present

## 2021-12-01 DIAGNOSIS — E1151 Type 2 diabetes mellitus with diabetic peripheral angiopathy without gangrene: Secondary | ICD-10-CM | POA: Diagnosis present

## 2021-12-01 DIAGNOSIS — R531 Weakness: Secondary | ICD-10-CM | POA: Diagnosis not present

## 2021-12-01 DIAGNOSIS — R000 Tachycardia, unspecified: Secondary | ICD-10-CM | POA: Diagnosis not present

## 2021-12-01 DIAGNOSIS — Z7984 Long term (current) use of oral hypoglycemic drugs: Secondary | ICD-10-CM | POA: Diagnosis not present

## 2021-12-01 DIAGNOSIS — I959 Hypotension, unspecified: Secondary | ICD-10-CM | POA: Diagnosis not present

## 2021-12-01 DIAGNOSIS — I491 Atrial premature depolarization: Secondary | ICD-10-CM | POA: Diagnosis not present

## 2021-12-01 DIAGNOSIS — I739 Peripheral vascular disease, unspecified: Secondary | ICD-10-CM | POA: Diagnosis not present

## 2021-12-01 DIAGNOSIS — D469 Myelodysplastic syndrome, unspecified: Secondary | ICD-10-CM | POA: Diagnosis present

## 2021-12-01 DIAGNOSIS — I214 Non-ST elevation (NSTEMI) myocardial infarction: Secondary | ICD-10-CM | POA: Diagnosis not present

## 2021-12-01 DIAGNOSIS — Z7901 Long term (current) use of anticoagulants: Secondary | ICD-10-CM | POA: Diagnosis not present

## 2021-12-01 DIAGNOSIS — I080 Rheumatic disorders of both mitral and aortic valves: Secondary | ICD-10-CM | POA: Diagnosis not present

## 2021-12-01 DIAGNOSIS — Z79899 Other long term (current) drug therapy: Secondary | ICD-10-CM | POA: Diagnosis not present

## 2021-12-01 DIAGNOSIS — R0602 Shortness of breath: Secondary | ICD-10-CM | POA: Diagnosis not present

## 2021-12-02 DIAGNOSIS — Z91041 Radiographic dye allergy status: Secondary | ICD-10-CM | POA: Diagnosis not present

## 2021-12-02 DIAGNOSIS — I959 Hypotension, unspecified: Secondary | ICD-10-CM | POA: Diagnosis not present

## 2021-12-02 DIAGNOSIS — Z7901 Long term (current) use of anticoagulants: Secondary | ICD-10-CM | POA: Diagnosis not present

## 2021-12-02 DIAGNOSIS — Z95828 Presence of other vascular implants and grafts: Secondary | ICD-10-CM | POA: Diagnosis not present

## 2021-12-02 DIAGNOSIS — Z79899 Other long term (current) drug therapy: Secondary | ICD-10-CM | POA: Diagnosis not present

## 2021-12-02 DIAGNOSIS — I491 Atrial premature depolarization: Secondary | ICD-10-CM | POA: Diagnosis not present

## 2021-12-02 DIAGNOSIS — Z955 Presence of coronary angioplasty implant and graft: Secondary | ICD-10-CM | POA: Diagnosis not present

## 2021-12-02 DIAGNOSIS — Z794 Long term (current) use of insulin: Secondary | ICD-10-CM | POA: Diagnosis not present

## 2021-12-02 DIAGNOSIS — I739 Peripheral vascular disease, unspecified: Secondary | ICD-10-CM | POA: Diagnosis not present

## 2021-12-02 DIAGNOSIS — I088 Other rheumatic multiple valve diseases: Secondary | ICD-10-CM | POA: Diagnosis present

## 2021-12-02 DIAGNOSIS — Z87891 Personal history of nicotine dependence: Secondary | ICD-10-CM | POA: Diagnosis not present

## 2021-12-02 DIAGNOSIS — M1120 Other chondrocalcinosis, unspecified site: Secondary | ICD-10-CM | POA: Diagnosis not present

## 2021-12-02 DIAGNOSIS — Z9981 Dependence on supplemental oxygen: Secondary | ICD-10-CM | POA: Diagnosis not present

## 2021-12-02 DIAGNOSIS — D500 Iron deficiency anemia secondary to blood loss (chronic): Secondary | ICD-10-CM | POA: Diagnosis not present

## 2021-12-02 DIAGNOSIS — I4891 Unspecified atrial fibrillation: Secondary | ICD-10-CM | POA: Diagnosis present

## 2021-12-02 DIAGNOSIS — Z7982 Long term (current) use of aspirin: Secondary | ICD-10-CM | POA: Diagnosis not present

## 2021-12-02 DIAGNOSIS — R7989 Other specified abnormal findings of blood chemistry: Secondary | ICD-10-CM | POA: Diagnosis not present

## 2021-12-02 DIAGNOSIS — E1151 Type 2 diabetes mellitus with diabetic peripheral angiopathy without gangrene: Secondary | ICD-10-CM | POA: Diagnosis present

## 2021-12-02 DIAGNOSIS — D469 Myelodysplastic syndrome, unspecified: Secondary | ICD-10-CM | POA: Diagnosis present

## 2021-12-02 DIAGNOSIS — R778 Other specified abnormalities of plasma proteins: Secondary | ICD-10-CM | POA: Diagnosis not present

## 2021-12-02 DIAGNOSIS — I5020 Unspecified systolic (congestive) heart failure: Secondary | ICD-10-CM | POA: Diagnosis not present

## 2021-12-02 DIAGNOSIS — Z7902 Long term (current) use of antithrombotics/antiplatelets: Secondary | ICD-10-CM | POA: Diagnosis not present

## 2021-12-02 DIAGNOSIS — I77819 Aortic ectasia, unspecified site: Secondary | ICD-10-CM | POA: Diagnosis not present

## 2021-12-02 DIAGNOSIS — I2510 Atherosclerotic heart disease of native coronary artery without angina pectoris: Secondary | ICD-10-CM | POA: Diagnosis present

## 2021-12-02 DIAGNOSIS — D638 Anemia in other chronic diseases classified elsewhere: Secondary | ICD-10-CM | POA: Diagnosis present

## 2021-12-02 DIAGNOSIS — I5021 Acute systolic (congestive) heart failure: Secondary | ICD-10-CM | POA: Diagnosis present

## 2021-12-02 DIAGNOSIS — Z7984 Long term (current) use of oral hypoglycemic drugs: Secondary | ICD-10-CM | POA: Diagnosis not present

## 2021-12-02 DIAGNOSIS — I080 Rheumatic disorders of both mitral and aortic valves: Secondary | ICD-10-CM | POA: Diagnosis not present

## 2021-12-02 DIAGNOSIS — I214 Non-ST elevation (NSTEMI) myocardial infarction: Secondary | ICD-10-CM | POA: Diagnosis not present

## 2021-12-10 DIAGNOSIS — Z8546 Personal history of malignant neoplasm of prostate: Secondary | ICD-10-CM | POA: Diagnosis not present

## 2021-12-10 DIAGNOSIS — Z87891 Personal history of nicotine dependence: Secondary | ICD-10-CM | POA: Diagnosis not present

## 2021-12-10 DIAGNOSIS — Z7984 Long term (current) use of oral hypoglycemic drugs: Secondary | ICD-10-CM | POA: Diagnosis not present

## 2021-12-10 DIAGNOSIS — D469 Myelodysplastic syndrome, unspecified: Secondary | ICD-10-CM | POA: Diagnosis not present

## 2021-12-10 DIAGNOSIS — Z7901 Long term (current) use of anticoagulants: Secondary | ICD-10-CM | POA: Diagnosis not present

## 2021-12-10 DIAGNOSIS — Z7902 Long term (current) use of antithrombotics/antiplatelets: Secondary | ICD-10-CM | POA: Diagnosis not present

## 2021-12-10 DIAGNOSIS — Z7969 Long term (current) use of other immunomodulators and immunosuppressants: Secondary | ICD-10-CM | POA: Diagnosis not present

## 2021-12-10 DIAGNOSIS — I959 Hypotension, unspecified: Secondary | ICD-10-CM | POA: Diagnosis not present

## 2021-12-10 DIAGNOSIS — Z95820 Peripheral vascular angioplasty status with implants and grafts: Secondary | ICD-10-CM | POA: Diagnosis not present

## 2021-12-10 DIAGNOSIS — D638 Anemia in other chronic diseases classified elsewhere: Secondary | ICD-10-CM | POA: Diagnosis not present

## 2021-12-10 DIAGNOSIS — I5021 Acute systolic (congestive) heart failure: Secondary | ICD-10-CM | POA: Diagnosis not present

## 2021-12-10 DIAGNOSIS — I080 Rheumatic disorders of both mitral and aortic valves: Secondary | ICD-10-CM | POA: Diagnosis not present

## 2021-12-10 DIAGNOSIS — Z7952 Long term (current) use of systemic steroids: Secondary | ICD-10-CM | POA: Diagnosis not present

## 2021-12-10 DIAGNOSIS — I6529 Occlusion and stenosis of unspecified carotid artery: Secondary | ICD-10-CM | POA: Diagnosis not present

## 2021-12-10 DIAGNOSIS — D472 Monoclonal gammopathy: Secondary | ICD-10-CM | POA: Diagnosis not present

## 2021-12-10 DIAGNOSIS — I2510 Atherosclerotic heart disease of native coronary artery without angina pectoris: Secondary | ICD-10-CM | POA: Diagnosis not present

## 2021-12-10 DIAGNOSIS — I4891 Unspecified atrial fibrillation: Secondary | ICD-10-CM | POA: Diagnosis not present

## 2021-12-10 DIAGNOSIS — E1151 Type 2 diabetes mellitus with diabetic peripheral angiopathy without gangrene: Secondary | ICD-10-CM | POA: Diagnosis not present

## 2021-12-10 DIAGNOSIS — Z8673 Personal history of transient ischemic attack (TIA), and cerebral infarction without residual deficits: Secondary | ICD-10-CM | POA: Diagnosis not present

## 2021-12-10 DIAGNOSIS — M1100 Hydroxyapatite deposition disease, unspecified site: Secondary | ICD-10-CM | POA: Diagnosis not present

## 2021-12-11 DIAGNOSIS — M818 Other osteoporosis without current pathological fracture: Secondary | ICD-10-CM | POA: Diagnosis not present

## 2021-12-11 DIAGNOSIS — Z1501 Genetic susceptibility to malignant neoplasm of breast: Secondary | ICD-10-CM | POA: Diagnosis not present

## 2021-12-11 DIAGNOSIS — Z8546 Personal history of malignant neoplasm of prostate: Secondary | ICD-10-CM | POA: Diagnosis not present

## 2021-12-11 DIAGNOSIS — M1990 Unspecified osteoarthritis, unspecified site: Secondary | ICD-10-CM | POA: Diagnosis not present

## 2021-12-11 DIAGNOSIS — D472 Monoclonal gammopathy: Secondary | ICD-10-CM | POA: Diagnosis not present

## 2021-12-11 DIAGNOSIS — K2901 Acute gastritis with bleeding: Secondary | ICD-10-CM | POA: Diagnosis not present

## 2021-12-11 DIAGNOSIS — Z9079 Acquired absence of other genital organ(s): Secondary | ICD-10-CM | POA: Diagnosis not present

## 2021-12-11 DIAGNOSIS — M1991 Primary osteoarthritis, unspecified site: Secondary | ICD-10-CM | POA: Diagnosis not present

## 2021-12-11 DIAGNOSIS — D638 Anemia in other chronic diseases classified elsewhere: Secondary | ICD-10-CM | POA: Diagnosis not present

## 2021-12-11 DIAGNOSIS — R350 Frequency of micturition: Secondary | ICD-10-CM | POA: Diagnosis not present

## 2021-12-11 DIAGNOSIS — R29898 Other symptoms and signs involving the musculoskeletal system: Secondary | ICD-10-CM | POA: Diagnosis not present

## 2021-12-11 DIAGNOSIS — M9272 Juvenile osteochondrosis of metatarsus, left foot: Secondary | ICD-10-CM | POA: Diagnosis not present

## 2021-12-11 DIAGNOSIS — Z9221 Personal history of antineoplastic chemotherapy: Secondary | ICD-10-CM | POA: Diagnosis not present

## 2021-12-11 DIAGNOSIS — M2550 Pain in unspecified joint: Secondary | ICD-10-CM | POA: Diagnosis not present

## 2021-12-11 DIAGNOSIS — T380X5A Adverse effect of glucocorticoids and synthetic analogues, initial encounter: Secondary | ICD-10-CM | POA: Diagnosis not present

## 2021-12-11 DIAGNOSIS — R002 Palpitations: Secondary | ICD-10-CM | POA: Diagnosis not present

## 2021-12-11 DIAGNOSIS — I10 Essential (primary) hypertension: Secondary | ICD-10-CM | POA: Diagnosis not present

## 2021-12-11 DIAGNOSIS — D469 Myelodysplastic syndrome, unspecified: Secondary | ICD-10-CM | POA: Diagnosis not present

## 2021-12-11 DIAGNOSIS — E119 Type 2 diabetes mellitus without complications: Secondary | ICD-10-CM | POA: Diagnosis not present

## 2021-12-11 DIAGNOSIS — R531 Weakness: Secondary | ICD-10-CM | POA: Diagnosis not present

## 2021-12-11 DIAGNOSIS — M1120 Other chondrocalcinosis, unspecified site: Secondary | ICD-10-CM | POA: Diagnosis not present

## 2021-12-12 DIAGNOSIS — I4891 Unspecified atrial fibrillation: Secondary | ICD-10-CM | POA: Diagnosis not present

## 2021-12-12 DIAGNOSIS — I5042 Chronic combined systolic (congestive) and diastolic (congestive) heart failure: Secondary | ICD-10-CM | POA: Diagnosis not present

## 2021-12-13 DIAGNOSIS — E1151 Type 2 diabetes mellitus with diabetic peripheral angiopathy without gangrene: Secondary | ICD-10-CM | POA: Diagnosis not present

## 2021-12-13 DIAGNOSIS — R9431 Abnormal electrocardiogram [ECG] [EKG]: Secondary | ICD-10-CM | POA: Diagnosis not present

## 2021-12-13 DIAGNOSIS — K219 Gastro-esophageal reflux disease without esophagitis: Secondary | ICD-10-CM | POA: Diagnosis not present

## 2021-12-13 DIAGNOSIS — I4891 Unspecified atrial fibrillation: Secondary | ICD-10-CM | POA: Diagnosis not present

## 2021-12-13 DIAGNOSIS — I5042 Chronic combined systolic (congestive) and diastolic (congestive) heart failure: Secondary | ICD-10-CM | POA: Diagnosis not present

## 2021-12-14 DIAGNOSIS — I5042 Chronic combined systolic (congestive) and diastolic (congestive) heart failure: Secondary | ICD-10-CM | POA: Diagnosis not present

## 2021-12-14 DIAGNOSIS — I4891 Unspecified atrial fibrillation: Secondary | ICD-10-CM | POA: Diagnosis not present

## 2021-12-15 DIAGNOSIS — E1169 Type 2 diabetes mellitus with other specified complication: Secondary | ICD-10-CM | POA: Diagnosis not present

## 2021-12-15 DIAGNOSIS — E782 Mixed hyperlipidemia: Secondary | ICD-10-CM | POA: Diagnosis not present

## 2021-12-15 DIAGNOSIS — K219 Gastro-esophageal reflux disease without esophagitis: Secondary | ICD-10-CM | POA: Diagnosis not present

## 2021-12-17 DIAGNOSIS — E1151 Type 2 diabetes mellitus with diabetic peripheral angiopathy without gangrene: Secondary | ICD-10-CM | POA: Diagnosis not present

## 2021-12-17 DIAGNOSIS — I4819 Other persistent atrial fibrillation: Secondary | ICD-10-CM | POA: Diagnosis not present

## 2021-12-17 DIAGNOSIS — I2510 Atherosclerotic heart disease of native coronary artery without angina pectoris: Secondary | ICD-10-CM | POA: Diagnosis not present

## 2021-12-17 DIAGNOSIS — I959 Hypotension, unspecified: Secondary | ICD-10-CM | POA: Diagnosis not present

## 2021-12-17 DIAGNOSIS — I4891 Unspecified atrial fibrillation: Secondary | ICD-10-CM | POA: Diagnosis not present

## 2021-12-17 DIAGNOSIS — I5021 Acute systolic (congestive) heart failure: Secondary | ICD-10-CM | POA: Diagnosis not present

## 2021-12-17 DIAGNOSIS — I5042 Chronic combined systolic (congestive) and diastolic (congestive) heart failure: Secondary | ICD-10-CM | POA: Diagnosis not present

## 2021-12-17 DIAGNOSIS — D469 Myelodysplastic syndrome, unspecified: Secondary | ICD-10-CM | POA: Diagnosis not present

## 2021-12-18 DIAGNOSIS — I5020 Unspecified systolic (congestive) heart failure: Secondary | ICD-10-CM | POA: Diagnosis not present

## 2021-12-18 DIAGNOSIS — B0229 Other postherpetic nervous system involvement: Secondary | ICD-10-CM | POA: Diagnosis not present

## 2021-12-18 DIAGNOSIS — Z7952 Long term (current) use of systemic steroids: Secondary | ICD-10-CM | POA: Diagnosis not present

## 2021-12-18 DIAGNOSIS — K8020 Calculus of gallbladder without cholecystitis without obstruction: Secondary | ICD-10-CM | POA: Diagnosis not present

## 2021-12-18 DIAGNOSIS — I959 Hypotension, unspecified: Secondary | ICD-10-CM | POA: Diagnosis not present

## 2021-12-18 DIAGNOSIS — D638 Anemia in other chronic diseases classified elsewhere: Secondary | ICD-10-CM | POA: Diagnosis not present

## 2021-12-18 DIAGNOSIS — E1165 Type 2 diabetes mellitus with hyperglycemia: Secondary | ICD-10-CM | POA: Diagnosis not present

## 2021-12-18 DIAGNOSIS — I6529 Occlusion and stenosis of unspecified carotid artery: Secondary | ICD-10-CM | POA: Diagnosis not present

## 2021-12-18 DIAGNOSIS — K4020 Bilateral inguinal hernia, without obstruction or gangrene, not specified as recurrent: Secondary | ICD-10-CM | POA: Diagnosis not present

## 2021-12-18 DIAGNOSIS — I700 Atherosclerosis of aorta: Secondary | ICD-10-CM | POA: Diagnosis not present

## 2021-12-18 DIAGNOSIS — D539 Nutritional anemia, unspecified: Secondary | ICD-10-CM | POA: Diagnosis not present

## 2021-12-18 DIAGNOSIS — K4090 Unilateral inguinal hernia, without obstruction or gangrene, not specified as recurrent: Secondary | ICD-10-CM | POA: Diagnosis not present

## 2021-12-18 DIAGNOSIS — I739 Peripheral vascular disease, unspecified: Secondary | ICD-10-CM | POA: Diagnosis not present

## 2021-12-18 DIAGNOSIS — E1140 Type 2 diabetes mellitus with diabetic neuropathy, unspecified: Secondary | ICD-10-CM | POA: Diagnosis not present

## 2021-12-18 DIAGNOSIS — N3289 Other specified disorders of bladder: Secondary | ICD-10-CM | POA: Diagnosis not present

## 2021-12-18 DIAGNOSIS — G629 Polyneuropathy, unspecified: Secondary | ICD-10-CM | POA: Diagnosis not present

## 2021-12-18 DIAGNOSIS — D469 Myelodysplastic syndrome, unspecified: Secondary | ICD-10-CM | POA: Diagnosis not present

## 2021-12-18 DIAGNOSIS — I4891 Unspecified atrial fibrillation: Secondary | ICD-10-CM | POA: Diagnosis not present

## 2021-12-18 DIAGNOSIS — Z79899 Other long term (current) drug therapy: Secondary | ICD-10-CM | POA: Diagnosis not present

## 2021-12-18 DIAGNOSIS — M1120 Other chondrocalcinosis, unspecified site: Secondary | ICD-10-CM | POA: Diagnosis not present

## 2021-12-18 DIAGNOSIS — Z8546 Personal history of malignant neoplasm of prostate: Secondary | ICD-10-CM | POA: Diagnosis not present

## 2021-12-18 DIAGNOSIS — E785 Hyperlipidemia, unspecified: Secondary | ICD-10-CM | POA: Diagnosis not present

## 2021-12-18 DIAGNOSIS — Z91041 Radiographic dye allergy status: Secondary | ICD-10-CM | POA: Diagnosis not present

## 2021-12-18 DIAGNOSIS — R7989 Other specified abnormal findings of blood chemistry: Secondary | ICD-10-CM | POA: Diagnosis not present

## 2021-12-18 DIAGNOSIS — I10 Essential (primary) hypertension: Secondary | ICD-10-CM | POA: Diagnosis not present

## 2021-12-18 DIAGNOSIS — K5730 Diverticulosis of large intestine without perforation or abscess without bleeding: Secondary | ICD-10-CM | POA: Diagnosis not present

## 2021-12-18 DIAGNOSIS — Z9079 Acquired absence of other genital organ(s): Secondary | ICD-10-CM | POA: Diagnosis not present

## 2021-12-19 DIAGNOSIS — D469 Myelodysplastic syndrome, unspecified: Secondary | ICD-10-CM | POA: Diagnosis not present

## 2021-12-19 DIAGNOSIS — K4020 Bilateral inguinal hernia, without obstruction or gangrene, not specified as recurrent: Secondary | ICD-10-CM | POA: Diagnosis not present

## 2021-12-19 DIAGNOSIS — T380X5A Adverse effect of glucocorticoids and synthetic analogues, initial encounter: Secondary | ICD-10-CM | POA: Diagnosis not present

## 2021-12-19 DIAGNOSIS — M818 Other osteoporosis without current pathological fracture: Secondary | ICD-10-CM | POA: Diagnosis not present

## 2021-12-21 DIAGNOSIS — R17 Unspecified jaundice: Secondary | ICD-10-CM | POA: Diagnosis not present

## 2021-12-21 DIAGNOSIS — K8020 Calculus of gallbladder without cholecystitis without obstruction: Secondary | ICD-10-CM | POA: Diagnosis not present

## 2021-12-24 DIAGNOSIS — Z23 Encounter for immunization: Secondary | ICD-10-CM | POA: Diagnosis not present

## 2021-12-26 DIAGNOSIS — D469 Myelodysplastic syndrome, unspecified: Secondary | ICD-10-CM | POA: Diagnosis not present

## 2021-12-26 DIAGNOSIS — D472 Monoclonal gammopathy: Secondary | ICD-10-CM | POA: Diagnosis not present

## 2022-01-06 DIAGNOSIS — I5020 Unspecified systolic (congestive) heart failure: Secondary | ICD-10-CM | POA: Diagnosis not present

## 2022-01-06 DIAGNOSIS — I959 Hypotension, unspecified: Secondary | ICD-10-CM | POA: Diagnosis not present

## 2022-01-06 DIAGNOSIS — Z6821 Body mass index (BMI) 21.0-21.9, adult: Secondary | ICD-10-CM | POA: Diagnosis not present

## 2022-01-06 DIAGNOSIS — I4891 Unspecified atrial fibrillation: Secondary | ICD-10-CM | POA: Diagnosis not present

## 2022-01-08 DIAGNOSIS — Z7952 Long term (current) use of systemic steroids: Secondary | ICD-10-CM | POA: Diagnosis not present

## 2022-01-08 DIAGNOSIS — E785 Hyperlipidemia, unspecified: Secondary | ICD-10-CM | POA: Diagnosis not present

## 2022-01-08 DIAGNOSIS — Z8546 Personal history of malignant neoplasm of prostate: Secondary | ICD-10-CM | POA: Diagnosis not present

## 2022-01-08 DIAGNOSIS — D469 Myelodysplastic syndrome, unspecified: Secondary | ICD-10-CM | POA: Diagnosis not present

## 2022-01-08 DIAGNOSIS — Z79899 Other long term (current) drug therapy: Secondary | ICD-10-CM | POA: Diagnosis not present

## 2022-01-08 DIAGNOSIS — I10 Essential (primary) hypertension: Secondary | ICD-10-CM | POA: Diagnosis not present

## 2022-01-08 DIAGNOSIS — E119 Type 2 diabetes mellitus without complications: Secondary | ICD-10-CM | POA: Diagnosis not present

## 2022-01-08 DIAGNOSIS — D638 Anemia in other chronic diseases classified elsewhere: Secondary | ICD-10-CM | POA: Diagnosis not present

## 2022-01-08 DIAGNOSIS — R5383 Other fatigue: Secondary | ICD-10-CM | POA: Diagnosis not present

## 2022-01-08 DIAGNOSIS — R531 Weakness: Secondary | ICD-10-CM | POA: Diagnosis not present

## 2022-01-08 DIAGNOSIS — C61 Malignant neoplasm of prostate: Secondary | ICD-10-CM | POA: Diagnosis not present

## 2022-01-08 DIAGNOSIS — I4891 Unspecified atrial fibrillation: Secondary | ICD-10-CM | POA: Diagnosis not present

## 2022-01-08 DIAGNOSIS — D630 Anemia in neoplastic disease: Secondary | ICD-10-CM | POA: Diagnosis not present

## 2022-01-08 DIAGNOSIS — Z1501 Genetic susceptibility to malignant neoplasm of breast: Secondary | ICD-10-CM | POA: Diagnosis not present

## 2022-01-09 DIAGNOSIS — D469 Myelodysplastic syndrome, unspecified: Secondary | ICD-10-CM | POA: Diagnosis not present

## 2022-01-09 DIAGNOSIS — T380X5A Adverse effect of glucocorticoids and synthetic analogues, initial encounter: Secondary | ICD-10-CM | POA: Diagnosis not present

## 2022-01-09 DIAGNOSIS — M818 Other osteoporosis without current pathological fracture: Secondary | ICD-10-CM | POA: Diagnosis not present

## 2022-01-11 DIAGNOSIS — I6522 Occlusion and stenosis of left carotid artery: Secondary | ICD-10-CM | POA: Diagnosis not present

## 2022-01-11 DIAGNOSIS — I6523 Occlusion and stenosis of bilateral carotid arteries: Secondary | ICD-10-CM | POA: Diagnosis not present

## 2022-01-11 DIAGNOSIS — Z7901 Long term (current) use of anticoagulants: Secondary | ICD-10-CM | POA: Diagnosis not present

## 2022-01-11 DIAGNOSIS — L97519 Non-pressure chronic ulcer of other part of right foot with unspecified severity: Secondary | ICD-10-CM | POA: Diagnosis not present

## 2022-01-11 DIAGNOSIS — E1151 Type 2 diabetes mellitus with diabetic peripheral angiopathy without gangrene: Secondary | ICD-10-CM | POA: Diagnosis not present

## 2022-01-11 DIAGNOSIS — Z79899 Other long term (current) drug therapy: Secondary | ICD-10-CM | POA: Diagnosis not present

## 2022-01-11 DIAGNOSIS — Z86718 Personal history of other venous thrombosis and embolism: Secondary | ICD-10-CM | POA: Diagnosis not present

## 2022-01-11 DIAGNOSIS — E11621 Type 2 diabetes mellitus with foot ulcer: Secondary | ICD-10-CM | POA: Diagnosis not present

## 2022-01-11 DIAGNOSIS — I739 Peripheral vascular disease, unspecified: Secondary | ICD-10-CM | POA: Diagnosis not present

## 2022-01-11 DIAGNOSIS — Z7984 Long term (current) use of oral hypoglycemic drugs: Secondary | ICD-10-CM | POA: Diagnosis not present

## 2022-01-11 DIAGNOSIS — Z95828 Presence of other vascular implants and grafts: Secondary | ICD-10-CM | POA: Diagnosis not present

## 2022-01-15 DIAGNOSIS — M79671 Pain in right foot: Secondary | ICD-10-CM | POA: Diagnosis not present

## 2022-01-15 DIAGNOSIS — L97521 Non-pressure chronic ulcer of other part of left foot limited to breakdown of skin: Secondary | ICD-10-CM | POA: Diagnosis not present

## 2022-01-15 DIAGNOSIS — Z79899 Other long term (current) drug therapy: Secondary | ICD-10-CM | POA: Diagnosis not present

## 2022-01-15 DIAGNOSIS — M8580 Other specified disorders of bone density and structure, unspecified site: Secondary | ICD-10-CM | POA: Diagnosis not present

## 2022-01-15 DIAGNOSIS — Z8739 Personal history of other diseases of the musculoskeletal system and connective tissue: Secondary | ICD-10-CM | POA: Diagnosis not present

## 2022-01-15 DIAGNOSIS — E1140 Type 2 diabetes mellitus with diabetic neuropathy, unspecified: Secondary | ICD-10-CM | POA: Diagnosis not present

## 2022-01-15 DIAGNOSIS — M179 Osteoarthritis of knee, unspecified: Secondary | ICD-10-CM | POA: Diagnosis not present

## 2022-01-15 DIAGNOSIS — B351 Tinea unguium: Secondary | ICD-10-CM | POA: Diagnosis not present

## 2022-01-15 DIAGNOSIS — Z89412 Acquired absence of left great toe: Secondary | ICD-10-CM | POA: Diagnosis not present

## 2022-01-15 DIAGNOSIS — E1151 Type 2 diabetes mellitus with diabetic peripheral angiopathy without gangrene: Secondary | ICD-10-CM | POA: Diagnosis not present

## 2022-01-15 DIAGNOSIS — L600 Ingrowing nail: Secondary | ICD-10-CM | POA: Diagnosis not present

## 2022-01-15 DIAGNOSIS — I4891 Unspecified atrial fibrillation: Secondary | ICD-10-CM | POA: Diagnosis not present

## 2022-01-15 DIAGNOSIS — Z7984 Long term (current) use of oral hypoglycemic drugs: Secondary | ICD-10-CM | POA: Diagnosis not present

## 2022-01-15 DIAGNOSIS — Z7952 Long term (current) use of systemic steroids: Secondary | ICD-10-CM | POA: Diagnosis not present

## 2022-01-15 DIAGNOSIS — E11618 Type 2 diabetes mellitus with other diabetic arthropathy: Secondary | ICD-10-CM | POA: Diagnosis not present

## 2022-01-17 DIAGNOSIS — D649 Anemia, unspecified: Secondary | ICD-10-CM | POA: Diagnosis not present

## 2022-01-17 DIAGNOSIS — I739 Peripheral vascular disease, unspecified: Secondary | ICD-10-CM | POA: Diagnosis not present

## 2022-01-17 DIAGNOSIS — L570 Actinic keratosis: Secondary | ICD-10-CM | POA: Diagnosis not present

## 2022-01-17 DIAGNOSIS — I5020 Unspecified systolic (congestive) heart failure: Secondary | ICD-10-CM | POA: Diagnosis not present

## 2022-01-17 DIAGNOSIS — Z8673 Personal history of transient ischemic attack (TIA), and cerebral infarction without residual deficits: Secondary | ICD-10-CM | POA: Diagnosis not present

## 2022-01-17 DIAGNOSIS — E1140 Type 2 diabetes mellitus with diabetic neuropathy, unspecified: Secondary | ICD-10-CM | POA: Diagnosis not present

## 2022-01-17 DIAGNOSIS — M1120 Other chondrocalcinosis, unspecified site: Secondary | ICD-10-CM | POA: Diagnosis not present

## 2022-01-17 DIAGNOSIS — Z23 Encounter for immunization: Secondary | ICD-10-CM | POA: Diagnosis not present

## 2022-01-17 DIAGNOSIS — I6529 Occlusion and stenosis of unspecified carotid artery: Secondary | ICD-10-CM | POA: Diagnosis not present

## 2022-01-17 DIAGNOSIS — I959 Hypotension, unspecified: Secondary | ICD-10-CM | POA: Diagnosis not present

## 2022-01-17 DIAGNOSIS — C44329 Squamous cell carcinoma of skin of other parts of face: Secondary | ICD-10-CM | POA: Diagnosis not present

## 2022-01-17 DIAGNOSIS — K4090 Unilateral inguinal hernia, without obstruction or gangrene, not specified as recurrent: Secondary | ICD-10-CM | POA: Diagnosis not present

## 2022-01-17 DIAGNOSIS — I4891 Unspecified atrial fibrillation: Secondary | ICD-10-CM | POA: Diagnosis not present

## 2022-01-22 DIAGNOSIS — L600 Ingrowing nail: Secondary | ICD-10-CM | POA: Diagnosis not present

## 2022-01-22 DIAGNOSIS — M79671 Pain in right foot: Secondary | ICD-10-CM | POA: Diagnosis not present

## 2022-01-22 DIAGNOSIS — L97521 Non-pressure chronic ulcer of other part of left foot limited to breakdown of skin: Secondary | ICD-10-CM | POA: Diagnosis not present

## 2022-01-24 DIAGNOSIS — E1165 Type 2 diabetes mellitus with hyperglycemia: Secondary | ICD-10-CM | POA: Diagnosis not present

## 2022-01-25 DIAGNOSIS — I739 Peripheral vascular disease, unspecified: Secondary | ICD-10-CM | POA: Diagnosis not present

## 2022-01-29 DIAGNOSIS — Z1501 Genetic susceptibility to malignant neoplasm of breast: Secondary | ICD-10-CM | POA: Diagnosis not present

## 2022-01-29 DIAGNOSIS — D472 Monoclonal gammopathy: Secondary | ICD-10-CM | POA: Diagnosis not present

## 2022-01-29 DIAGNOSIS — D469 Myelodysplastic syndrome, unspecified: Secondary | ICD-10-CM | POA: Diagnosis not present

## 2022-01-29 DIAGNOSIS — D638 Anemia in other chronic diseases classified elsewhere: Secondary | ICD-10-CM | POA: Diagnosis not present

## 2022-01-29 DIAGNOSIS — I739 Peripheral vascular disease, unspecified: Secondary | ICD-10-CM | POA: Diagnosis not present

## 2022-01-30 DIAGNOSIS — M818 Other osteoporosis without current pathological fracture: Secondary | ICD-10-CM | POA: Diagnosis not present

## 2022-01-30 DIAGNOSIS — T380X5A Adverse effect of glucocorticoids and synthetic analogues, initial encounter: Secondary | ICD-10-CM | POA: Diagnosis not present

## 2022-01-30 DIAGNOSIS — D469 Myelodysplastic syndrome, unspecified: Secondary | ICD-10-CM | POA: Diagnosis not present

## 2022-02-05 DIAGNOSIS — I739 Peripheral vascular disease, unspecified: Secondary | ICD-10-CM | POA: Diagnosis not present

## 2022-02-05 DIAGNOSIS — I96 Gangrene, not elsewhere classified: Secondary | ICD-10-CM | POA: Diagnosis not present

## 2022-02-05 DIAGNOSIS — I70201 Unspecified atherosclerosis of native arteries of extremities, right leg: Secondary | ICD-10-CM | POA: Diagnosis not present

## 2022-02-05 DIAGNOSIS — I70261 Atherosclerosis of native arteries of extremities with gangrene, right leg: Secondary | ICD-10-CM | POA: Diagnosis not present

## 2022-02-11 DIAGNOSIS — I2510 Atherosclerotic heart disease of native coronary artery without angina pectoris: Secondary | ICD-10-CM | POA: Diagnosis not present

## 2022-02-11 DIAGNOSIS — I700 Atherosclerosis of aorta: Secondary | ICD-10-CM | POA: Diagnosis not present

## 2022-02-11 DIAGNOSIS — I4819 Other persistent atrial fibrillation: Secondary | ICD-10-CM | POA: Diagnosis not present

## 2022-02-12 DIAGNOSIS — L03115 Cellulitis of right lower limb: Secondary | ICD-10-CM | POA: Diagnosis present

## 2022-02-12 DIAGNOSIS — M79671 Pain in right foot: Secondary | ICD-10-CM | POA: Diagnosis not present

## 2022-02-12 DIAGNOSIS — Z01810 Encounter for preprocedural cardiovascular examination: Secondary | ICD-10-CM | POA: Diagnosis not present

## 2022-02-12 DIAGNOSIS — Z7901 Long term (current) use of anticoagulants: Secondary | ICD-10-CM | POA: Diagnosis not present

## 2022-02-12 DIAGNOSIS — I4892 Unspecified atrial flutter: Secondary | ICD-10-CM | POA: Diagnosis not present

## 2022-02-12 DIAGNOSIS — E1142 Type 2 diabetes mellitus with diabetic polyneuropathy: Secondary | ICD-10-CM | POA: Diagnosis not present

## 2022-02-12 DIAGNOSIS — Z89412 Acquired absence of left great toe: Secondary | ICD-10-CM | POA: Diagnosis not present

## 2022-02-12 DIAGNOSIS — I5022 Chronic systolic (congestive) heart failure: Secondary | ICD-10-CM | POA: Diagnosis present

## 2022-02-12 DIAGNOSIS — I771 Stricture of artery: Secondary | ICD-10-CM | POA: Diagnosis not present

## 2022-02-12 DIAGNOSIS — E0940 Drug or chemical induced diabetes mellitus with neurological complications with diabetic neuropathy, unspecified: Secondary | ICD-10-CM | POA: Diagnosis present

## 2022-02-12 DIAGNOSIS — E0965 Drug or chemical induced diabetes mellitus with hyperglycemia: Secondary | ICD-10-CM | POA: Diagnosis not present

## 2022-02-12 DIAGNOSIS — I739 Peripheral vascular disease, unspecified: Secondary | ICD-10-CM | POA: Diagnosis not present

## 2022-02-12 DIAGNOSIS — Z7984 Long term (current) use of oral hypoglycemic drugs: Secondary | ICD-10-CM | POA: Diagnosis not present

## 2022-02-12 DIAGNOSIS — M11241 Other chondrocalcinosis, right hand: Secondary | ICD-10-CM | POA: Diagnosis present

## 2022-02-12 DIAGNOSIS — Z8673 Personal history of transient ischemic attack (TIA), and cerebral infarction without residual deficits: Secondary | ICD-10-CM | POA: Diagnosis not present

## 2022-02-12 DIAGNOSIS — I4819 Other persistent atrial fibrillation: Secondary | ICD-10-CM | POA: Diagnosis present

## 2022-02-12 DIAGNOSIS — Z8546 Personal history of malignant neoplasm of prostate: Secondary | ICD-10-CM | POA: Diagnosis not present

## 2022-02-12 DIAGNOSIS — E0952 Drug or chemical induced diabetes mellitus with diabetic peripheral angiopathy with gangrene: Secondary | ICD-10-CM | POA: Diagnosis present

## 2022-02-12 DIAGNOSIS — G1119 Other early-onset cerebellar ataxia: Secondary | ICD-10-CM | POA: Diagnosis present

## 2022-02-12 DIAGNOSIS — T380X5A Adverse effect of glucocorticoids and synthetic analogues, initial encounter: Secondary | ICD-10-CM | POA: Diagnosis present

## 2022-02-12 DIAGNOSIS — S91109A Unspecified open wound of unspecified toe(s) without damage to nail, initial encounter: Secondary | ICD-10-CM | POA: Diagnosis not present

## 2022-02-12 DIAGNOSIS — Z7952 Long term (current) use of systemic steroids: Secondary | ICD-10-CM | POA: Diagnosis not present

## 2022-02-12 DIAGNOSIS — I70221 Atherosclerosis of native arteries of extremities with rest pain, right leg: Secondary | ICD-10-CM | POA: Diagnosis not present

## 2022-02-12 DIAGNOSIS — D6489 Other specified anemias: Secondary | ICD-10-CM | POA: Diagnosis not present

## 2022-02-12 DIAGNOSIS — M11242 Other chondrocalcinosis, left hand: Secondary | ICD-10-CM | POA: Diagnosis present

## 2022-02-12 DIAGNOSIS — Z87891 Personal history of nicotine dependence: Secondary | ICD-10-CM | POA: Diagnosis not present

## 2022-02-12 DIAGNOSIS — D696 Thrombocytopenia, unspecified: Secondary | ICD-10-CM | POA: Diagnosis not present

## 2022-02-12 DIAGNOSIS — E1165 Type 2 diabetes mellitus with hyperglycemia: Secondary | ICD-10-CM | POA: Diagnosis not present

## 2022-02-12 DIAGNOSIS — E785 Hyperlipidemia, unspecified: Secondary | ICD-10-CM | POA: Diagnosis present

## 2022-02-12 DIAGNOSIS — I4891 Unspecified atrial fibrillation: Secondary | ICD-10-CM | POA: Diagnosis not present

## 2022-02-12 DIAGNOSIS — I70261 Atherosclerosis of native arteries of extremities with gangrene, right leg: Secondary | ICD-10-CM | POA: Diagnosis present

## 2022-02-12 DIAGNOSIS — I96 Gangrene, not elsewhere classified: Secondary | ICD-10-CM | POA: Diagnosis not present

## 2022-02-12 DIAGNOSIS — Z86718 Personal history of other venous thrombosis and embolism: Secondary | ICD-10-CM | POA: Diagnosis not present

## 2022-02-12 DIAGNOSIS — D4620 Refractory anemia with excess of blasts, unspecified: Secondary | ICD-10-CM | POA: Diagnosis present

## 2022-02-12 DIAGNOSIS — Z20822 Contact with and (suspected) exposure to covid-19: Secondary | ICD-10-CM | POA: Diagnosis present

## 2022-02-12 DIAGNOSIS — M069 Rheumatoid arthritis, unspecified: Secondary | ICD-10-CM | POA: Diagnosis present

## 2022-02-12 DIAGNOSIS — M1180 Other specified crystal arthropathies, unspecified site: Secondary | ICD-10-CM | POA: Diagnosis not present

## 2022-02-12 DIAGNOSIS — L988 Other specified disorders of the skin and subcutaneous tissue: Secondary | ICD-10-CM | POA: Diagnosis not present

## 2022-02-12 HISTORY — PX: ATRIAL FIBRILLATION ABLATION: SHX5732

## 2022-02-13 DIAGNOSIS — I4891 Unspecified atrial fibrillation: Secondary | ICD-10-CM | POA: Diagnosis not present

## 2022-02-14 DIAGNOSIS — M79671 Pain in right foot: Secondary | ICD-10-CM | POA: Diagnosis not present

## 2022-02-15 DIAGNOSIS — Z20822 Contact with and (suspected) exposure to covid-19: Secondary | ICD-10-CM | POA: Diagnosis present

## 2022-02-15 DIAGNOSIS — E0965 Drug or chemical induced diabetes mellitus with hyperglycemia: Secondary | ICD-10-CM | POA: Diagnosis not present

## 2022-02-15 DIAGNOSIS — Z7984 Long term (current) use of oral hypoglycemic drugs: Secondary | ICD-10-CM | POA: Diagnosis not present

## 2022-02-15 DIAGNOSIS — T380X5A Adverse effect of glucocorticoids and synthetic analogues, initial encounter: Secondary | ICD-10-CM | POA: Diagnosis present

## 2022-02-15 DIAGNOSIS — E0940 Drug or chemical induced diabetes mellitus with neurological complications with diabetic neuropathy, unspecified: Secondary | ICD-10-CM | POA: Diagnosis present

## 2022-02-15 DIAGNOSIS — E0952 Drug or chemical induced diabetes mellitus with diabetic peripheral angiopathy with gangrene: Secondary | ICD-10-CM | POA: Diagnosis present

## 2022-02-15 DIAGNOSIS — L988 Other specified disorders of the skin and subcutaneous tissue: Secondary | ICD-10-CM | POA: Diagnosis not present

## 2022-02-15 DIAGNOSIS — I5022 Chronic systolic (congestive) heart failure: Secondary | ICD-10-CM | POA: Diagnosis present

## 2022-02-15 DIAGNOSIS — I70261 Atherosclerosis of native arteries of extremities with gangrene, right leg: Secondary | ICD-10-CM | POA: Diagnosis present

## 2022-02-15 DIAGNOSIS — Z01810 Encounter for preprocedural cardiovascular examination: Secondary | ICD-10-CM | POA: Diagnosis not present

## 2022-02-15 DIAGNOSIS — M11241 Other chondrocalcinosis, right hand: Secondary | ICD-10-CM | POA: Diagnosis present

## 2022-02-15 DIAGNOSIS — G1119 Other early-onset cerebellar ataxia: Secondary | ICD-10-CM | POA: Diagnosis present

## 2022-02-15 DIAGNOSIS — S91109A Unspecified open wound of unspecified toe(s) without damage to nail, initial encounter: Secondary | ICD-10-CM | POA: Diagnosis not present

## 2022-02-15 DIAGNOSIS — E1165 Type 2 diabetes mellitus with hyperglycemia: Secondary | ICD-10-CM | POA: Diagnosis not present

## 2022-02-15 DIAGNOSIS — D4620 Refractory anemia with excess of blasts, unspecified: Secondary | ICD-10-CM | POA: Diagnosis present

## 2022-02-15 DIAGNOSIS — M11242 Other chondrocalcinosis, left hand: Secondary | ICD-10-CM | POA: Diagnosis present

## 2022-02-15 DIAGNOSIS — M069 Rheumatoid arthritis, unspecified: Secondary | ICD-10-CM | POA: Diagnosis present

## 2022-02-15 DIAGNOSIS — M79671 Pain in right foot: Secondary | ICD-10-CM | POA: Diagnosis not present

## 2022-02-15 DIAGNOSIS — I4819 Other persistent atrial fibrillation: Secondary | ICD-10-CM | POA: Diagnosis present

## 2022-02-15 DIAGNOSIS — L03115 Cellulitis of right lower limb: Secondary | ICD-10-CM | POA: Diagnosis present

## 2022-02-15 DIAGNOSIS — Z8546 Personal history of malignant neoplasm of prostate: Secondary | ICD-10-CM | POA: Diagnosis not present

## 2022-02-15 DIAGNOSIS — Z8673 Personal history of transient ischemic attack (TIA), and cerebral infarction without residual deficits: Secondary | ICD-10-CM | POA: Diagnosis not present

## 2022-02-15 DIAGNOSIS — Z86718 Personal history of other venous thrombosis and embolism: Secondary | ICD-10-CM | POA: Diagnosis not present

## 2022-02-15 DIAGNOSIS — I739 Peripheral vascular disease, unspecified: Secondary | ICD-10-CM | POA: Diagnosis not present

## 2022-02-15 DIAGNOSIS — D696 Thrombocytopenia, unspecified: Secondary | ICD-10-CM | POA: Diagnosis not present

## 2022-02-15 DIAGNOSIS — E1142 Type 2 diabetes mellitus with diabetic polyneuropathy: Secondary | ICD-10-CM | POA: Diagnosis not present

## 2022-02-15 DIAGNOSIS — I96 Gangrene, not elsewhere classified: Secondary | ICD-10-CM | POA: Diagnosis not present

## 2022-02-15 DIAGNOSIS — D6489 Other specified anemias: Secondary | ICD-10-CM | POA: Diagnosis not present

## 2022-02-15 DIAGNOSIS — Z7901 Long term (current) use of anticoagulants: Secondary | ICD-10-CM | POA: Diagnosis not present

## 2022-02-15 DIAGNOSIS — I70221 Atherosclerosis of native arteries of extremities with rest pain, right leg: Secondary | ICD-10-CM | POA: Diagnosis not present

## 2022-02-15 DIAGNOSIS — Z87891 Personal history of nicotine dependence: Secondary | ICD-10-CM | POA: Diagnosis not present

## 2022-02-15 DIAGNOSIS — E785 Hyperlipidemia, unspecified: Secondary | ICD-10-CM | POA: Diagnosis present

## 2022-02-15 DIAGNOSIS — Z89412 Acquired absence of left great toe: Secondary | ICD-10-CM | POA: Diagnosis not present

## 2022-02-15 DIAGNOSIS — I771 Stricture of artery: Secondary | ICD-10-CM | POA: Diagnosis not present

## 2022-02-15 DIAGNOSIS — M1180 Other specified crystal arthropathies, unspecified site: Secondary | ICD-10-CM | POA: Diagnosis not present

## 2022-02-15 DIAGNOSIS — Z7952 Long term (current) use of systemic steroids: Secondary | ICD-10-CM | POA: Diagnosis not present

## 2022-02-16 DIAGNOSIS — L988 Other specified disorders of the skin and subcutaneous tissue: Secondary | ICD-10-CM | POA: Diagnosis not present

## 2022-02-19 DIAGNOSIS — Z7984 Long term (current) use of oral hypoglycemic drugs: Secondary | ICD-10-CM | POA: Diagnosis not present

## 2022-02-19 DIAGNOSIS — Z7952 Long term (current) use of systemic steroids: Secondary | ICD-10-CM | POA: Diagnosis not present

## 2022-02-19 DIAGNOSIS — E1142 Type 2 diabetes mellitus with diabetic polyneuropathy: Secondary | ICD-10-CM | POA: Diagnosis not present

## 2022-02-19 DIAGNOSIS — I739 Peripheral vascular disease, unspecified: Secondary | ICD-10-CM | POA: Diagnosis not present

## 2022-02-19 DIAGNOSIS — E1165 Type 2 diabetes mellitus with hyperglycemia: Secondary | ICD-10-CM | POA: Diagnosis not present

## 2022-02-20 DIAGNOSIS — E1142 Type 2 diabetes mellitus with diabetic polyneuropathy: Secondary | ICD-10-CM | POA: Diagnosis not present

## 2022-02-20 DIAGNOSIS — Z7984 Long term (current) use of oral hypoglycemic drugs: Secondary | ICD-10-CM | POA: Diagnosis not present

## 2022-02-20 DIAGNOSIS — Z7952 Long term (current) use of systemic steroids: Secondary | ICD-10-CM | POA: Diagnosis not present

## 2022-02-20 DIAGNOSIS — E1165 Type 2 diabetes mellitus with hyperglycemia: Secondary | ICD-10-CM | POA: Diagnosis not present

## 2022-02-21 DIAGNOSIS — Z7952 Long term (current) use of systemic steroids: Secondary | ICD-10-CM | POA: Diagnosis not present

## 2022-02-21 DIAGNOSIS — E1142 Type 2 diabetes mellitus with diabetic polyneuropathy: Secondary | ICD-10-CM | POA: Diagnosis not present

## 2022-02-21 DIAGNOSIS — Z7984 Long term (current) use of oral hypoglycemic drugs: Secondary | ICD-10-CM | POA: Diagnosis not present

## 2022-02-21 DIAGNOSIS — E1165 Type 2 diabetes mellitus with hyperglycemia: Secondary | ICD-10-CM | POA: Diagnosis not present

## 2022-02-22 DIAGNOSIS — E1165 Type 2 diabetes mellitus with hyperglycemia: Secondary | ICD-10-CM | POA: Diagnosis not present

## 2022-02-22 DIAGNOSIS — E1142 Type 2 diabetes mellitus with diabetic polyneuropathy: Secondary | ICD-10-CM | POA: Diagnosis not present

## 2022-02-22 DIAGNOSIS — Z7984 Long term (current) use of oral hypoglycemic drugs: Secondary | ICD-10-CM | POA: Diagnosis not present

## 2022-02-22 DIAGNOSIS — Z7952 Long term (current) use of systemic steroids: Secondary | ICD-10-CM | POA: Diagnosis not present

## 2022-02-25 DIAGNOSIS — Z7984 Long term (current) use of oral hypoglycemic drugs: Secondary | ICD-10-CM | POA: Diagnosis not present

## 2022-02-25 DIAGNOSIS — E1165 Type 2 diabetes mellitus with hyperglycemia: Secondary | ICD-10-CM | POA: Diagnosis not present

## 2022-02-25 DIAGNOSIS — E1142 Type 2 diabetes mellitus with diabetic polyneuropathy: Secondary | ICD-10-CM | POA: Diagnosis not present

## 2022-02-25 DIAGNOSIS — Z7952 Long term (current) use of systemic steroids: Secondary | ICD-10-CM | POA: Diagnosis not present

## 2022-02-25 DIAGNOSIS — I96 Gangrene, not elsewhere classified: Secondary | ICD-10-CM | POA: Diagnosis not present

## 2022-02-26 DIAGNOSIS — Z7984 Long term (current) use of oral hypoglycemic drugs: Secondary | ICD-10-CM | POA: Diagnosis not present

## 2022-02-26 DIAGNOSIS — E1142 Type 2 diabetes mellitus with diabetic polyneuropathy: Secondary | ICD-10-CM | POA: Diagnosis not present

## 2022-02-26 DIAGNOSIS — E1165 Type 2 diabetes mellitus with hyperglycemia: Secondary | ICD-10-CM | POA: Diagnosis not present

## 2022-02-26 DIAGNOSIS — Z7952 Long term (current) use of systemic steroids: Secondary | ICD-10-CM | POA: Diagnosis not present

## 2022-02-27 DIAGNOSIS — Z7952 Long term (current) use of systemic steroids: Secondary | ICD-10-CM | POA: Diagnosis not present

## 2022-02-27 DIAGNOSIS — Z7984 Long term (current) use of oral hypoglycemic drugs: Secondary | ICD-10-CM | POA: Diagnosis not present

## 2022-02-27 DIAGNOSIS — E1165 Type 2 diabetes mellitus with hyperglycemia: Secondary | ICD-10-CM | POA: Diagnosis not present

## 2022-02-27 DIAGNOSIS — E1142 Type 2 diabetes mellitus with diabetic polyneuropathy: Secondary | ICD-10-CM | POA: Diagnosis not present

## 2022-03-02 DIAGNOSIS — R030 Elevated blood-pressure reading, without diagnosis of hypertension: Secondary | ICD-10-CM | POA: Diagnosis not present

## 2022-03-02 DIAGNOSIS — E1140 Type 2 diabetes mellitus with diabetic neuropathy, unspecified: Secondary | ICD-10-CM | POA: Diagnosis not present

## 2022-03-02 DIAGNOSIS — R609 Edema, unspecified: Secondary | ICD-10-CM | POA: Diagnosis not present

## 2022-03-02 DIAGNOSIS — I4891 Unspecified atrial fibrillation: Secondary | ICD-10-CM | POA: Diagnosis not present

## 2022-03-02 DIAGNOSIS — I739 Peripheral vascular disease, unspecified: Secondary | ICD-10-CM | POA: Diagnosis not present

## 2022-03-06 DIAGNOSIS — I998 Other disorder of circulatory system: Secondary | ICD-10-CM | POA: Diagnosis not present

## 2022-03-06 DIAGNOSIS — D472 Monoclonal gammopathy: Secondary | ICD-10-CM | POA: Diagnosis not present

## 2022-03-06 DIAGNOSIS — D469 Myelodysplastic syndrome, unspecified: Secondary | ICD-10-CM | POA: Diagnosis not present

## 2022-03-06 DIAGNOSIS — M79604 Pain in right leg: Secondary | ICD-10-CM | POA: Diagnosis not present

## 2022-03-06 DIAGNOSIS — Z1501 Genetic susceptibility to malignant neoplasm of breast: Secondary | ICD-10-CM | POA: Diagnosis not present

## 2022-03-06 DIAGNOSIS — D638 Anemia in other chronic diseases classified elsewhere: Secondary | ICD-10-CM | POA: Diagnosis not present

## 2022-03-07 DIAGNOSIS — D638 Anemia in other chronic diseases classified elsewhere: Secondary | ICD-10-CM | POA: Diagnosis not present

## 2022-03-07 DIAGNOSIS — Z95828 Presence of other vascular implants and grafts: Secondary | ICD-10-CM | POA: Diagnosis not present

## 2022-03-14 DIAGNOSIS — E1152 Type 2 diabetes mellitus with diabetic peripheral angiopathy with gangrene: Secondary | ICD-10-CM | POA: Diagnosis not present

## 2022-03-14 DIAGNOSIS — M79671 Pain in right foot: Secondary | ICD-10-CM | POA: Diagnosis not present

## 2022-03-14 DIAGNOSIS — Z89511 Acquired absence of right leg below knee: Secondary | ICD-10-CM | POA: Diagnosis not present

## 2022-03-14 DIAGNOSIS — S91301A Unspecified open wound, right foot, initial encounter: Secondary | ICD-10-CM | POA: Diagnosis not present

## 2022-03-14 DIAGNOSIS — Z89412 Acquired absence of left great toe: Secondary | ICD-10-CM | POA: Diagnosis not present

## 2022-03-14 DIAGNOSIS — D696 Thrombocytopenia, unspecified: Secondary | ICD-10-CM | POA: Diagnosis not present

## 2022-03-14 DIAGNOSIS — I69331 Monoplegia of upper limb following cerebral infarction affecting right dominant side: Secondary | ICD-10-CM | POA: Diagnosis not present

## 2022-03-14 DIAGNOSIS — Z6820 Body mass index (BMI) 20.0-20.9, adult: Secondary | ICD-10-CM | POA: Diagnosis not present

## 2022-03-14 DIAGNOSIS — I4891 Unspecified atrial fibrillation: Secondary | ICD-10-CM | POA: Diagnosis not present

## 2022-03-14 DIAGNOSIS — B0221 Postherpetic geniculate ganglionitis: Secondary | ICD-10-CM | POA: Diagnosis not present

## 2022-03-14 DIAGNOSIS — Z9079 Acquired absence of other genital organ(s): Secondary | ICD-10-CM | POA: Diagnosis not present

## 2022-03-14 DIAGNOSIS — E1142 Type 2 diabetes mellitus with diabetic polyneuropathy: Secondary | ICD-10-CM | POA: Diagnosis not present

## 2022-03-14 DIAGNOSIS — M792 Neuralgia and neuritis, unspecified: Secondary | ICD-10-CM | POA: Diagnosis not present

## 2022-03-14 DIAGNOSIS — Z7984 Long term (current) use of oral hypoglycemic drugs: Secondary | ICD-10-CM | POA: Diagnosis not present

## 2022-03-14 DIAGNOSIS — L97514 Non-pressure chronic ulcer of other part of right foot with necrosis of bone: Secondary | ICD-10-CM | POA: Diagnosis not present

## 2022-03-14 DIAGNOSIS — D649 Anemia, unspecified: Secondary | ICD-10-CM | POA: Diagnosis not present

## 2022-03-14 DIAGNOSIS — K219 Gastro-esophageal reflux disease without esophagitis: Secondary | ICD-10-CM | POA: Diagnosis not present

## 2022-03-14 DIAGNOSIS — I959 Hypotension, unspecified: Secondary | ICD-10-CM | POA: Diagnosis not present

## 2022-03-14 DIAGNOSIS — T380X5A Adverse effect of glucocorticoids and synthetic analogues, initial encounter: Secondary | ICD-10-CM | POA: Diagnosis not present

## 2022-03-14 DIAGNOSIS — D638 Anemia in other chronic diseases classified elsewhere: Secondary | ICD-10-CM | POA: Diagnosis not present

## 2022-03-14 DIAGNOSIS — M7022 Olecranon bursitis, left elbow: Secondary | ICD-10-CM | POA: Diagnosis not present

## 2022-03-14 DIAGNOSIS — H9190 Unspecified hearing loss, unspecified ear: Secondary | ICD-10-CM | POA: Diagnosis not present

## 2022-03-14 DIAGNOSIS — E785 Hyperlipidemia, unspecified: Secondary | ICD-10-CM | POA: Diagnosis not present

## 2022-03-14 DIAGNOSIS — R5381 Other malaise: Secondary | ICD-10-CM | POA: Diagnosis not present

## 2022-03-14 DIAGNOSIS — I70221 Atherosclerosis of native arteries of extremities with rest pain, right leg: Secondary | ICD-10-CM | POA: Diagnosis not present

## 2022-03-14 DIAGNOSIS — R195 Other fecal abnormalities: Secondary | ICD-10-CM | POA: Diagnosis not present

## 2022-03-14 DIAGNOSIS — E1165 Type 2 diabetes mellitus with hyperglycemia: Secondary | ICD-10-CM | POA: Diagnosis not present

## 2022-03-14 DIAGNOSIS — I5022 Chronic systolic (congestive) heart failure: Secondary | ICD-10-CM | POA: Diagnosis not present

## 2022-03-14 DIAGNOSIS — D62 Acute posthemorrhagic anemia: Secondary | ICD-10-CM | POA: Diagnosis not present

## 2022-03-14 DIAGNOSIS — D472 Monoclonal gammopathy: Secondary | ICD-10-CM | POA: Diagnosis not present

## 2022-03-14 DIAGNOSIS — E0965 Drug or chemical induced diabetes mellitus with hyperglycemia: Secondary | ICD-10-CM | POA: Diagnosis not present

## 2022-03-14 DIAGNOSIS — G8918 Other acute postprocedural pain: Secondary | ICD-10-CM | POA: Diagnosis not present

## 2022-03-14 DIAGNOSIS — R636 Underweight: Secondary | ICD-10-CM | POA: Diagnosis not present

## 2022-03-14 DIAGNOSIS — M069 Rheumatoid arthritis, unspecified: Secondary | ICD-10-CM | POA: Diagnosis not present

## 2022-03-14 DIAGNOSIS — I739 Peripheral vascular disease, unspecified: Secondary | ICD-10-CM | POA: Diagnosis not present

## 2022-03-14 DIAGNOSIS — S88111D Complete traumatic amputation at level between knee and ankle, right lower leg, subsequent encounter: Secondary | ICD-10-CM | POA: Diagnosis not present

## 2022-03-14 DIAGNOSIS — Z7952 Long term (current) use of systemic steroids: Secondary | ICD-10-CM | POA: Diagnosis not present

## 2022-03-14 DIAGNOSIS — E871 Hypo-osmolality and hyponatremia: Secondary | ICD-10-CM | POA: Diagnosis not present

## 2022-03-14 DIAGNOSIS — Z4781 Encounter for orthopedic aftercare following surgical amputation: Secondary | ICD-10-CM | POA: Diagnosis not present

## 2022-03-14 DIAGNOSIS — K5900 Constipation, unspecified: Secondary | ICD-10-CM | POA: Diagnosis not present

## 2022-03-14 DIAGNOSIS — E876 Hypokalemia: Secondary | ICD-10-CM | POA: Diagnosis not present

## 2022-03-14 DIAGNOSIS — L03115 Cellulitis of right lower limb: Secondary | ICD-10-CM | POA: Diagnosis not present

## 2022-03-14 DIAGNOSIS — G546 Phantom limb syndrome with pain: Secondary | ICD-10-CM | POA: Diagnosis not present

## 2022-03-14 DIAGNOSIS — S78111A Complete traumatic amputation at level between right hip and knee, initial encounter: Secondary | ICD-10-CM | POA: Diagnosis not present

## 2022-03-14 DIAGNOSIS — G4700 Insomnia, unspecified: Secondary | ICD-10-CM | POA: Diagnosis not present

## 2022-03-14 DIAGNOSIS — Z89422 Acquired absence of other left toe(s): Secondary | ICD-10-CM | POA: Diagnosis not present

## 2022-03-14 DIAGNOSIS — M11222 Other chondrocalcinosis, left elbow: Secondary | ICD-10-CM | POA: Diagnosis not present

## 2022-03-14 DIAGNOSIS — I70203 Unspecified atherosclerosis of native arteries of extremities, bilateral legs: Secondary | ICD-10-CM | POA: Diagnosis not present

## 2022-03-14 DIAGNOSIS — G1119 Other early-onset cerebellar ataxia: Secondary | ICD-10-CM | POA: Diagnosis not present

## 2022-03-14 DIAGNOSIS — M6281 Muscle weakness (generalized): Secondary | ICD-10-CM | POA: Diagnosis not present

## 2022-03-14 DIAGNOSIS — I70261 Atherosclerosis of native arteries of extremities with gangrene, right leg: Secondary | ICD-10-CM | POA: Diagnosis not present

## 2022-03-14 DIAGNOSIS — R279 Unspecified lack of coordination: Secondary | ICD-10-CM | POA: Diagnosis not present

## 2022-03-15 ENCOUNTER — Encounter: Payer: Self-pay | Admitting: *Deleted

## 2022-03-15 NOTE — PMR Pre-admission (Shared)
PMR Admission Coordinator Pre-Admission Assessment  Patient: David Joseph is an 81 y.o., male MRN: [id number] DOB: 11/05/1940 Height:   Weight:    Insurance Information HMO:     PPO:      PCP:      IPA:      80/20:      OTHER:  PRIMARY: Medicare a and b      Policy#: [id number]      Subscriber: pt CM Name:       Phone#:      Fax#:  Pre-Cert#:       Employer:  Benefits:  Phone #: passport one source     Name: 12/29 Eff. Date: 06/17/2007     Deduct: $ 1632      Out of Pocket Max: none      Life Max: none CIR: 100%      SNF: 20 full days Outpatient: 80%     Co-Pay: 20% Home Health: 100%      Co-Pay: none DME: 80%     Co-Pay: 20% Providers: pt choice  SECONDARY: [employer]      Policy#: [id number]  Financial Counselor:       Phone#:   The "Data Collection Information Summary" for patients in Inpatient Rehabilitation Facilities with attached "Privacy Act [place] Records" was provided and verbally reviewed with: Family  Emergency Contact Information Contact Information     Name Relation Home Work Mobile   [name],[name] Spouse [phone]  [phone]      Current Medical History  Patient Admitting Diagnosis: BKA  History of Present Illness: 81 year old male with history of prostate cancer, corticosteroid induced DM, MDS, IgA lambda MGUS,  right internal carotid stent, 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  Recent R CFA and profunda endarterectomy on 02/25/22. Bypass was not felt feasible and decision was made to proceed to amputation.   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 Postoperative pain management . Home statin and metoprolol held. On regular diet. Home hydroxychloroquine and prednisone resumed for RA. Heparin Sq for DVT prophylaxis. Hold off Reblozyl injection per  heme onc. Ancef given intraoperatively. SSI for DM.   Patient's medical record from [facility] has been reviewed by the rehabilitation admission coordinator and physician.  Past Medical History  Past Medical History:  Diagnosis Date   Anemia    Arthritis    OA   Diverticulitis    10 years ago   DVT (deep venous thrombosis) ([place]) 04/09/2015   left leg- recently   Pneumonia 13 years ago   Rheumatoid arthritis ([place])    Has the patient had major surgery during 100 days prior to admission? Yes  Family History   family history is not on file.  Current Medications  Current Outpatient Medications:    predniSONE (DELTASONE) 5 MG tablet, Take 10 mg by mouth daily with breakfast., Disp: , Rfl:   Patients Current Diet: Diet Regular with thin liquids  Precautions / Restrictions Precautions: Fall Precaution Comments: NWB RLE Weight Bearing Restrictions: Yes RLE Weight Bearing: NWB   Has the patient had 2 or more falls or a fall with injury in the past year? No  Prior Activity Level Limited Community (1-2x/wk): over past few weeks RW in the home due to RLE procedures  Prior Functional  Level Self Care: Did the patient need help bathing, dressing, using the toilet or eating? Independent  Indoor Mobility: Did the patient need assistance with walking from room to room (with or without device)? Independent  Stairs: Did the patient need assistance with internal or external stairs (with or without device)? Independent  Functional Cognition: Did the patient need help planning regular tasks such as shopping or remembering to take medications? Independent  Patient Information Are you of Hispanic, Latino/a,or Spanish origin?: A. No, not of Hispanic, Latino/a, or Spanish origin What is your race?: A. White Do you need or want an interpreter to communicate with a doctor or health care staff?: 0. No  Patient's Response To:  Health Literacy and Transportation Is the patient able to respond  to health literacy and transportation needs?: Yes Health Literacy - How often do you need to have someone help you when you read instructions, pamphlets, or other written material from your doctor or pharmacy?: Never In the past 12 months, has lack of transportation kept you from medical appointments or from getting medications?: No In the past 12 months, has lack of transportation kept you from meetings, work, or from getting things needed for daily living?: No  Home Assistive Devices / Herbalist (specify type); Shower chair with back (rollator, grab bars in shower, grab bars around toilet, wife looking into 1 step ramp inside to common area and chiar lift vs ramp for entry into home)  Prior Device Use: Indicate devices/aids used by the patient prior to current illness, exacerbation or injury? Walker   Prior Functional Level Current Functional Level  Bed Mobility  Independent Mod assist   Transfers  Independent  Mod assist   Mobility - Walk/Wheelchair  Mod Independent  Mod assist   Upper Body Dressing  Independent  Min assist   Lower Body Dressing  Independent  Mod assist   Grooming  Independent  Independent   Eating/Drinking  Independent  Independent   Toilet Transfer  Independent  Mod assist   Bladder Continence   continent  continent   Bowel Management  continent  continent   Stair Climbing  Mod Independent  Other   Communication  independent  independent   Memory  intact intact     Special Needs/ Care Considerations Fall precautions  Previous Home Environment  Living Arrangements: Spouse/significant other  Lives With: Spouse Available Help at Discharge: Family; Available 24 hours/day (wife 34 years old and very active, son out of state) Type of Home: House Home Layout: One level Home Access: Stairs to enter Entrance Stairs-Rails: Left Entrance Stairs-Number of Steps: 5 Bathroom Shower/Tub: Walk-in shower (with grab bars in shower  and around toilet) Bathroom Toilet: Handicapped height Bathroom Accessibility: Yes How Accessible: Accessible via walker [place]: No Additional Comments: wife getting modifications for inside home common area has one step up as well as wife checking into outside ramp vs chairleft for entry into home   Discharge Living Setting Plans for Discharge Living Setting: Patient's home; Lives with (comment) (wife) Type of Home at Discharge: House Discharge Home Layout: One level Discharge Home Access: Stairs to enter Entrance Stairs-Rails: Right Entrance Stairs-Number of Steps: 5 Discharge Bathroom Shower/Tub: Walk-in shower (with grab bars in shower and around toilet) Discharge Bathroom Toilet: Handicapped height Discharge Bathroom Accessibility: Yes How Accessible: Accessible via walker Does the patient have any problems obtaining your medications?: No  Social/Family/Support Systems Patient Roles: Spouse Contact Information: wife Arbie Cookey Anticipated Caregiver: wife Anticipated Caregiver's Contact Information: [phone] Ability/Limitations  of Caregiver: wife 107 years old and very active ( tennis) Caregiver Availability: 24/7 Discharge Plan Discussed with Primary Caregiver: Yes Is Caregiver In Agreement with Plan?: Yes Does Caregiver/Family have Issues with Lodging/Transportation while Pt is in Rehab?: No  Goals Patient/Family Goal for Rehab: Mod I to supervision with PT and OT at wheelchair level Expected length of stay: [facility] 7 to 10 days Additional Information: wife having modifications in their home Pt/Family Agrees to Admission and willing to participate: Yes Program Orientation Provided & Reviewed with Pt/Caregiver Including Roles  & Responsibilities: Yes  Decrease burden of Care through IP rehab admission: n/a  Possible need for SNF placement upon discharge: not anticipated  Patient Condition: I have reviewed medical records from [facility], spoken with CM, and  spouse. I discussed via phone for inpatient rehabilitation assessment.  Patient will benefit from ongoing PT and OT, can actively participate in 3 hours of therapy a day 5 days of the week, and can make measurable gains during the admission.  Patient will also benefit from the coordinated team approach during an Inpatient Acute Rehabilitation admission.  The patient will receive intensive therapy as well as Rehabilitation physician, nursing, social worker, and care management interventions.  Due to bladder management, bowel management, safety, skin/wound care, disease management, medication administration, pain management, and patient education the patient requires 24 hour a day rehabilitation nursing.  The patient is currently *** with mobility and basic ADLs.  Discharge setting and therapy post discharge at home with home health is anticipated.  Patient has agreed to participate in the Acute Inpatient Rehabilitation Program and will admit today.  Preadmission Screen Completed By: Danne Baxter RN MSN with updates by Julious Payer, Audelia Acton, 03/15/2022 5:45 PM ______________________________________________________________________   Discussed status with Dr. Marland Kitchen on *** at *** and received approval for admission today.  Admission [place] RN MSN with updates by ****  Cleatrice Burke, RN, time Marland KitchenSudie Grumbling ***   Assessment/Plan: Diagnosis: Does the need for close, 24 hr/day Medical supervision in concert with the patient's rehab needs make it unreasonable for this patient to be served in a less intensive setting? {yes_no_potentially:3041433} Co-Morbidities requiring supervision/potential complications: *** Due to {due EZ:6629476}, does the patient require 24 hr/day rehab nursing? {yes_no_potentially:3041433} Does the patient require coordinated care of a physician, rehab nurse, PT, OT, and SLP to address physical and functional deficits in the context of the above medical  diagnosis(es)? {yes_no_potentially:3041433} Addressing deficits in the following areas: {deficits:3041436} Can the patient actively participate in an intensive therapy program of at least 3 hrs of therapy 5 days a week? {yes_no_potentially:3041433} The potential for patient to make measurable gains while on inpatient rehab is {potential:3041437} Anticipated functional outcomes upon discharge from inpatient rehab: {functional outcomes:304600100} PT, {functional outcomes:304600100} OT, {functional outcomes:304600100} SLP Estimated rehab length of stay to reach the above functional goals is: *** Anticipated discharge destination: {anticipated dc setting:21604} 10. Overall Rehab/Functional Prognosis: {potential:3041437}  MD Signature ***

## 2022-03-20 NOTE — H&P (Incomplete)
Physical Medicine and Rehabilitation Admission H&P    CC: Debility secondary to right below the knee amputation  HPI: David Joseph is a 82 year old male with a history of critical limb ischemia with chronic wounds and significant rest pain of the right lower extremity.  He had been treated with multiple antibiotics for chronic wound and was admitted with gangrenous changes to his right foot.  He has a history of peripheral arterial disease and had undergone left femoral-popliteal bypass grafting and left toe amputation in the past.  He was being followed by his vascular surgeon Allayne Stack, MD at [facility].  He has a history of atrial fibrillation and was transitioned off his DOAC to perform angiography on 02/18/2022.  He underwent right common femoral artery and profunda endarterectomy with patch angioplasty and profundoplasty on 12/11. A bypass was not feasible and mutual decision was made to proceed with BKA.  This was performed on 12/28.   Operative leg pain was well-controlled with femoral and popliteal nerve catheters.  Pain service managed these catheters.  He was also given oxycodone as needed and pain was also improved with Lyrica.  He was started on heparin subcutaneously for DVT prophylaxis.  His Reblozyl injection was held as per hematology/oncology recommendations.  His home statin and metoprolol and Eliquis were restarted 1/1.  He was tolerating regular diet.  He was voiding spontaneously.  Hydroxychloroquine and prednisone for his rheumatoid arthritis was also restarted.    Other relevant past medical history includes myelodysplastic syndrome, diabetes mellitus type 2, failure with reduced ejection fraction, cerebrovascular atherosclerotic disease status post CVA, anemia, history of prostate cancer, history of Ramsay Hunt syndrome, IgA lambda MGUS  ROS Past Medical History:  Diagnosis Date   Anemia    Arthritis    OA   Diverticulitis    10 years ago   DVT (deep venous  thrombosis) ([place]) 04/09/2015   left leg- recently   Pneumonia 13 years ago   Rheumatoid arthritis [facility])    Past Surgical History:  Procedure Laterality Date   BACK SURGERY  1980   lower    basal cell areas removed  Sept 2013, 2012   left lower lip and chest   COLONOSCOPY  August 2013   EYE SURGERY  2 years ago   both cataract   JOINT REPLACEMENT  March 2012, 02/2012   right knee, redo right knee   left middle finger surgery  March 2012   PROSTATECTOMY  12/2013   at [place]  60 years ago   TOTAL KNEE ARTHROPLASTY Left 10/09/2015   Procedure: LEFT TOTAL KNEE ARTHROPLASTY;  Surgeon: Gaynelle Arabian, MD;  Location: [facility];  Service: Orthopedics;  Laterality: Left;   TOTAL KNEE REVISION  02/19/2012   Procedure: TOTAL KNEE REVISION;  Surgeon: [name], MD;  Location: [facility];  Service: Orthopedics;  Laterality: Right;   No family history on file. Social History:  reports that he quit smoking about 10 years ago. His smoking use included cigars. He has been exposed to tobacco smoke. He has never used smokeless tobacco. He reports current alcohol use of about 7.0 standard drinks of alcohol per week. He reports that he does not use drugs. Allergies:  Allergies  Allergen Reactions   Iodinated Contrast Media Rash    Can tolerate IV dye with steroid protocol   No medications prior to admission.      Home:     Functional History:    Functional Status:  Mobility:          ADL:    Cognition:      Physical Exam: There were no vitals taken for this visit. Physical Exam  No results found for this or any previous visit (from the past 48 hour(s)). No results found.    There were no vitals taken for this visit.  Medical Problem List and Plan: 1. Functional deficits secondary to ***  -patient may *** shower  -ELOS/Goals: *** 2.  Antithrombotics: -DVT/anticoagulation:  {VTE PROPHYLAXIS/ANTICOAGULATION - [id number]}  -antiplatelet therapy: *** 3.  Pain Management: *** 4. Mood/Behavior/Sleep: ***  -antipsychotic agents: *** 5. Neuropsych/cognition: This patient *** capable of making decisions on *** own behalf. 6. Skin/Wound Care: *** 7. 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

## 2022-03-21 ENCOUNTER — Other Ambulatory Visit: Payer: Self-pay | Admitting: Physical Medicine and Rehabilitation

## 2022-03-21 ENCOUNTER — Inpatient Hospital Stay (HOSPITAL_COMMUNITY)
Admission: RE | Admit: 2022-03-21 | Discharge: 2022-04-08 | DRG: 560 | Disposition: A | Discharge status: Home Health | Payer: Medicare Other | Source: Other Acute Inpatient Hospital | Attending: Physical Medicine & Rehabilitation | Admitting: Physical Medicine & Rehabilitation

## 2022-03-21 ENCOUNTER — Encounter (HOSPITAL_COMMUNITY): Payer: Self-pay | Admitting: Physical Medicine & Rehabilitation

## 2022-03-21 ENCOUNTER — Other Ambulatory Visit: Payer: Self-pay

## 2022-03-21 DIAGNOSIS — K5900 Constipation, unspecified: Secondary | ICD-10-CM | POA: Diagnosis not present

## 2022-03-21 DIAGNOSIS — D472 Monoclonal gammopathy: Secondary | ICD-10-CM | POA: Diagnosis present

## 2022-03-21 DIAGNOSIS — R195 Other fecal abnormalities: Secondary | ICD-10-CM | POA: Diagnosis present

## 2022-03-21 DIAGNOSIS — D62 Acute posthemorrhagic anemia: Secondary | ICD-10-CM | POA: Diagnosis present

## 2022-03-21 DIAGNOSIS — Z6820 Body mass index (BMI) 20.0-20.9, adult: Secondary | ICD-10-CM

## 2022-03-21 DIAGNOSIS — Z981 Arthrodesis status: Secondary | ICD-10-CM

## 2022-03-21 DIAGNOSIS — M7022 Olecranon bursitis, left elbow: Secondary | ICD-10-CM | POA: Diagnosis present

## 2022-03-21 DIAGNOSIS — Z9079 Acquired absence of other genital organ(s): Secondary | ICD-10-CM | POA: Diagnosis not present

## 2022-03-21 DIAGNOSIS — Z4781 Encounter for orthopedic aftercare following surgical amputation: Secondary | ICD-10-CM | POA: Diagnosis not present

## 2022-03-21 DIAGNOSIS — Z820 Family history of epilepsy and other diseases of the nervous system: Secondary | ICD-10-CM

## 2022-03-21 DIAGNOSIS — T380X5D Adverse effect of glucocorticoids and synthetic analogues, subsequent encounter: Secondary | ICD-10-CM

## 2022-03-21 DIAGNOSIS — I70203 Unspecified atherosclerosis of native arteries of extremities, bilateral legs: Secondary | ICD-10-CM | POA: Diagnosis present

## 2022-03-21 DIAGNOSIS — G546 Phantom limb syndrome with pain: Secondary | ICD-10-CM | POA: Diagnosis present

## 2022-03-21 DIAGNOSIS — E871 Hypo-osmolality and hyponatremia: Secondary | ICD-10-CM | POA: Diagnosis present

## 2022-03-21 DIAGNOSIS — D649 Anemia, unspecified: Secondary | ICD-10-CM | POA: Diagnosis not present

## 2022-03-21 DIAGNOSIS — R636 Underweight: Secondary | ICD-10-CM | POA: Diagnosis present

## 2022-03-21 DIAGNOSIS — E876 Hypokalemia: Secondary | ICD-10-CM | POA: Diagnosis not present

## 2022-03-21 DIAGNOSIS — G4700 Insomnia, unspecified: Secondary | ICD-10-CM | POA: Diagnosis present

## 2022-03-21 DIAGNOSIS — M11222 Other chondrocalcinosis, left elbow: Secondary | ICD-10-CM | POA: Diagnosis present

## 2022-03-21 DIAGNOSIS — I4891 Unspecified atrial fibrillation: Secondary | ICD-10-CM | POA: Diagnosis present

## 2022-03-21 DIAGNOSIS — B0221 Postherpetic geniculate ganglionitis: Secondary | ICD-10-CM | POA: Diagnosis present

## 2022-03-21 DIAGNOSIS — S88111D Complete traumatic amputation at level between knee and ankle, right lower leg, subsequent encounter: Secondary | ICD-10-CM | POA: Diagnosis not present

## 2022-03-21 DIAGNOSIS — H9190 Unspecified hearing loss, unspecified ear: Secondary | ICD-10-CM | POA: Diagnosis present

## 2022-03-21 DIAGNOSIS — M792 Neuralgia and neuritis, unspecified: Secondary | ICD-10-CM | POA: Diagnosis not present

## 2022-03-21 DIAGNOSIS — Z8673 Personal history of transient ischemic attack (TIA), and cerebral infarction without residual deficits: Secondary | ICD-10-CM

## 2022-03-21 DIAGNOSIS — S78111A Complete traumatic amputation at level between right hip and knee, initial encounter: Secondary | ICD-10-CM

## 2022-03-21 DIAGNOSIS — Z89412 Acquired absence of left great toe: Secondary | ICD-10-CM

## 2022-03-21 DIAGNOSIS — M069 Rheumatoid arthritis, unspecified: Secondary | ICD-10-CM | POA: Diagnosis present

## 2022-03-21 DIAGNOSIS — Z91041 Radiographic dye allergy status: Secondary | ICD-10-CM

## 2022-03-21 DIAGNOSIS — Z87891 Personal history of nicotine dependence: Secondary | ICD-10-CM

## 2022-03-21 DIAGNOSIS — Z79899 Other long term (current) drug therapy: Secondary | ICD-10-CM

## 2022-03-21 DIAGNOSIS — E0965 Drug or chemical induced diabetes mellitus with hyperglycemia: Secondary | ICD-10-CM | POA: Diagnosis present

## 2022-03-21 DIAGNOSIS — I959 Hypotension, unspecified: Secondary | ICD-10-CM | POA: Diagnosis not present

## 2022-03-21 DIAGNOSIS — Z8546 Personal history of malignant neoplasm of prostate: Secondary | ICD-10-CM

## 2022-03-21 DIAGNOSIS — D696 Thrombocytopenia, unspecified: Secondary | ICD-10-CM | POA: Diagnosis present

## 2022-03-21 DIAGNOSIS — Z89511 Acquired absence of right leg below knee: Secondary | ICD-10-CM | POA: Diagnosis not present

## 2022-03-21 DIAGNOSIS — Z741 Need for assistance with personal care: Secondary | ICD-10-CM | POA: Diagnosis present

## 2022-03-21 DIAGNOSIS — Z8249 Family history of ischemic heart disease and other diseases of the circulatory system: Secondary | ICD-10-CM

## 2022-03-21 DIAGNOSIS — I5022 Chronic systolic (congestive) heart failure: Secondary | ICD-10-CM | POA: Diagnosis present

## 2022-03-21 DIAGNOSIS — I739 Peripheral vascular disease, unspecified: Secondary | ICD-10-CM | POA: Diagnosis not present

## 2022-03-21 DIAGNOSIS — Z7952 Long term (current) use of systemic steroids: Secondary | ICD-10-CM

## 2022-03-21 DIAGNOSIS — Z96653 Presence of artificial knee joint, bilateral: Secondary | ICD-10-CM | POA: Diagnosis present

## 2022-03-21 DIAGNOSIS — R110 Nausea: Secondary | ICD-10-CM | POA: Diagnosis not present

## 2022-03-21 DIAGNOSIS — E1142 Type 2 diabetes mellitus with diabetic polyneuropathy: Secondary | ICD-10-CM | POA: Diagnosis not present

## 2022-03-21 DIAGNOSIS — Z95820 Peripheral vascular angioplasty status with implants and grafts: Secondary | ICD-10-CM

## 2022-03-21 HISTORY — DX: Heart failure, unspecified: I50.9

## 2022-03-21 HISTORY — DX: Presence of other vascular implants and grafts: Z95.828

## 2022-03-21 HISTORY — DX: Other chondrocalcinosis, unspecified site: M11.20

## 2022-03-21 HISTORY — DX: Malignant neoplasm of prostate: C61

## 2022-03-21 LAB — GLUCOSE, CAPILLARY
Glucose-Capillary: 202 mg/dL — ABNORMAL HIGH (ref 70–99)
Glucose-Capillary: 142 mg/dL — ABNORMAL HIGH (ref 70–99)

## 2022-03-21 MED ORDER — PREDNISONE 5 MG PO TABS
15.0000 mg | ORAL_TABLET | Freq: Every day | ORAL | Status: DC
  Administered 2022-03-22 – 2022-04-08 (×18): 15 mg via ORAL
  Filled 2022-03-21 (×18): qty 3

## 2022-03-21 MED ORDER — HYDROXYCHLOROQUINE SULFATE 200 MG PO TABS
200.0000 mg | ORAL_TABLET | Freq: Every day | ORAL | Status: DC
  Administered 2022-03-22 – 2022-04-08 (×18): 200 mg via ORAL
  Filled 2022-03-21 (×18): qty 1

## 2022-03-21 MED ORDER — PREGABALIN 50 MG PO CAPS
50.0000 mg | ORAL_CAPSULE | Freq: Every day | ORAL | Status: DC
  Administered 2022-03-21 – 2022-03-25 (×5): 50 mg via ORAL
  Filled 2022-03-21 (×5): qty 1

## 2022-03-21 MED ORDER — BISACODYL 10 MG RE SUPP: 10.0000 mg | Freq: Every day | RECTAL | Status: DC | PRN

## 2022-03-21 MED ORDER — ATORVASTATIN CALCIUM 80 MG PO TABS
80.0000 mg | ORAL_TABLET | Freq: Every day | ORAL | Status: DC
  Administered 2022-03-22 – 2022-04-08 (×18): 80 mg via ORAL
  Filled 2022-03-21 (×18): qty 1

## 2022-03-21 MED ORDER — FLEET ENEMA 7-19 GM/118ML RE ENEM: 1.0000 | ENEMA | Freq: Once | RECTAL | Status: DC | PRN

## 2022-03-21 MED ORDER — JUVEN PO PACK
1.0000 | PACK | Freq: Two times a day (BID) | ORAL | Status: DC
  Administered 2022-03-21 – 2022-04-07 (×29): 1 via ORAL
  Filled 2022-03-21 (×30): qty 1

## 2022-03-21 MED ORDER — INSULIN ASPART 100 UNIT/ML IJ SOLN
0.0000 [IU] | Freq: Every day | INTRAMUSCULAR | Status: DC
  Administered 2022-03-24 – 2022-03-28 (×2): 2 [IU] via SUBCUTANEOUS

## 2022-03-21 MED ORDER — PROCHLORPERAZINE MALEATE 5 MG PO TABS: 5.0000 mg | ORAL_TABLET | Freq: Four times a day (QID) | ORAL | Status: DC | PRN

## 2022-03-21 MED ORDER — VITAMIN C 500 MG PO TABS
500.0000 mg | ORAL_TABLET | Freq: Every day | ORAL | Status: DC
  Administered 2022-03-21 – 2022-04-08 (×19): 500 mg via ORAL
  Filled 2022-03-21 (×19): qty 1

## 2022-03-21 MED ORDER — PROSOURCE PLUS PO LIQD: 30.0000 mL | Freq: Two times a day (BID) | ORAL | Status: DC

## 2022-03-21 MED ORDER — TRAMADOL HCL 50 MG PO TABS
50.0000 mg | ORAL_TABLET | Freq: Four times a day (QID) | ORAL | Status: DC | PRN
  Administered 2022-04-04: 50 mg via ORAL
  Filled 2022-03-21: qty 1

## 2022-03-21 MED ORDER — PANTOPRAZOLE SODIUM 40 MG PO TBEC
40.0000 mg | DELAYED_RELEASE_TABLET | Freq: Every day | ORAL | Status: DC
  Administered 2022-03-22 – 2022-04-08 (×18): 40 mg via ORAL
  Filled 2022-03-21 (×18): qty 1

## 2022-03-21 MED ORDER — METHOCARBAMOL 500 MG PO TABS
500.0000 mg | ORAL_TABLET | Freq: Four times a day (QID) | ORAL | Status: DC | PRN
  Administered 2022-03-24 – 2022-04-08 (×5): 500 mg via ORAL
  Filled 2022-03-21 (×6): qty 1

## 2022-03-21 MED ORDER — ALUM & MAG HYDROXIDE-SIMETH 200-200-20 MG/5ML PO SUSP: 30.0000 mL | ORAL | Status: DC | PRN

## 2022-03-21 MED ORDER — PROCHLORPERAZINE EDISYLATE 10 MG/2ML IJ SOLN: 5.0000 mg | Freq: Four times a day (QID) | INTRAMUSCULAR | Status: DC | PRN

## 2022-03-21 MED ORDER — PROSOURCE PLUS PO LIQD
30.0000 mL | Freq: Two times a day (BID) | ORAL | Status: DC
  Administered 2022-03-21 – 2022-04-07 (×35): 30 mL via ORAL
  Filled 2022-03-21 (×35): qty 30

## 2022-03-21 MED ORDER — PREGABALIN 75 MG PO CAPS
75.0000 mg | ORAL_CAPSULE | Freq: Two times a day (BID) | ORAL | Status: DC
  Administered 2022-03-21 – 2022-03-25 (×8): 75 mg via ORAL
  Filled 2022-03-21 (×8): qty 1

## 2022-03-21 MED ORDER — ACETAMINOPHEN 325 MG PO TABS
325.0000 mg | ORAL_TABLET | ORAL | Status: DC | PRN
  Administered 2022-03-24: 650 mg via ORAL
  Filled 2022-03-21: qty 2

## 2022-03-21 MED ORDER — TRAZODONE HCL 50 MG PO TABS
25.0000 mg | ORAL_TABLET | Freq: Every evening | ORAL | Status: DC | PRN
  Administered 2022-03-29 – 2022-04-03 (×3): 50 mg via ORAL
  Filled 2022-03-21 (×3): qty 1

## 2022-03-21 MED ORDER — DULOXETINE HCL 20 MG PO CPEP: 20.0000 mg | ORAL_CAPSULE | Freq: Every day | ORAL | Status: DC

## 2022-03-21 MED ORDER — POLYETHYLENE GLYCOL 3350 17 G PO PACK
17.0000 g | PACK | Freq: Every day | ORAL | Status: DC | PRN
  Administered 2022-03-23 – 2022-04-04 (×4): 17 g via ORAL
  Filled 2022-03-21 (×3): qty 1

## 2022-03-21 MED ORDER — PROCHLORPERAZINE 25 MG RE SUPP: 12.5000 mg | Freq: Four times a day (QID) | RECTAL | Status: DC | PRN

## 2022-03-21 MED ORDER — GLIPIZIDE 2.5 MG HALF TABLET: 2.5000 mg | ORAL_TABLET | Freq: Every day | ORAL | Status: DC

## 2022-03-21 MED ORDER — DIPHENHYDRAMINE HCL 12.5 MG/5ML PO ELIX: 12.5000 mg | ORAL_SOLUTION | Freq: Four times a day (QID) | ORAL | Status: DC | PRN

## 2022-03-21 MED ORDER — APIXABAN 5 MG PO TABS
5.0000 mg | ORAL_TABLET | Freq: Two times a day (BID) | ORAL | Status: DC
  Administered 2022-03-21 – 2022-04-08 (×36): 5 mg via ORAL
  Filled 2022-03-21 (×36): qty 1

## 2022-03-21 MED ORDER — GUAIFENESIN-DM 100-10 MG/5ML PO SYRP: 5.0000 mL | ORAL_SOLUTION | Freq: Four times a day (QID) | ORAL | Status: DC | PRN

## 2022-03-21 MED ORDER — INSULIN ASPART 100 UNIT/ML IJ SOLN
0.0000 [IU] | Freq: Three times a day (TID) | INTRAMUSCULAR | Status: DC
  Administered 2022-03-21 – 2022-03-23 (×4): 3 [IU] via SUBCUTANEOUS
  Administered 2022-03-23 – 2022-03-25 (×4): 2 [IU] via SUBCUTANEOUS
  Administered 2022-03-25: 1 [IU] via SUBCUTANEOUS
  Administered 2022-03-25: 3 [IU] via SUBCUTANEOUS
  Administered 2022-03-26 (×2): 2 [IU] via SUBCUTANEOUS
  Administered 2022-03-27: 3 [IU] via SUBCUTANEOUS
  Administered 2022-03-27 – 2022-03-28 (×2): 2 [IU] via SUBCUTANEOUS
  Administered 2022-03-28: 3 [IU] via SUBCUTANEOUS
  Administered 2022-03-29 (×2): 2 [IU] via SUBCUTANEOUS
  Administered 2022-03-30: 5 [IU] via SUBCUTANEOUS
  Administered 2022-03-30: 1 [IU] via SUBCUTANEOUS
  Administered 2022-03-31: 3 [IU] via SUBCUTANEOUS
  Administered 2022-03-31: 2 [IU] via SUBCUTANEOUS
  Administered 2022-04-01: 3 [IU] via SUBCUTANEOUS
  Administered 2022-04-01 – 2022-04-02 (×2): 2 [IU] via SUBCUTANEOUS
  Administered 2022-04-02: 5 [IU] via SUBCUTANEOUS
  Administered 2022-04-03: 2 [IU] via SUBCUTANEOUS
  Administered 2022-04-03: 7 [IU] via SUBCUTANEOUS
  Administered 2022-04-04 – 2022-04-05 (×4): 2 [IU] via SUBCUTANEOUS
  Administered 2022-04-06: 3 [IU] via SUBCUTANEOUS
  Administered 2022-04-06 – 2022-04-07 (×2): 1 [IU] via SUBCUTANEOUS

## 2022-03-21 MED ORDER — OXYCODONE HCL 5 MG PO TABS
5.0000 mg | ORAL_TABLET | ORAL | Status: DC | PRN
  Administered 2022-03-21 – 2022-03-26 (×16): 10 mg via ORAL
  Administered 2022-03-27: 5 mg via ORAL
  Administered 2022-03-27 – 2022-04-01 (×12): 10 mg via ORAL
  Administered 2022-04-02 (×2): 5 mg via ORAL
  Administered 2022-04-03: 10 mg via ORAL
  Administered 2022-04-03: 5 mg via ORAL
  Administered 2022-04-04: 10 mg via ORAL
  Administered 2022-04-04: 5 mg via ORAL
  Administered 2022-04-05 – 2022-04-08 (×7): 10 mg via ORAL
  Filled 2022-03-21 (×3): qty 2
  Filled 2022-03-21: qty 1
  Filled 2022-03-21 (×21): qty 2
  Filled 2022-03-21: qty 1
  Filled 2022-03-21 (×5): qty 2
  Filled 2022-03-21: qty 1
  Filled 2022-03-21 (×7): qty 2
  Filled 2022-03-21: qty 1
  Filled 2022-03-21: qty 2
  Filled 2022-03-21: qty 1
  Filled 2022-03-21 (×3): qty 2

## 2022-03-21 MED ORDER — VALACYCLOVIR HCL 500 MG PO TABS
1000.0000 mg | ORAL_TABLET | Freq: Two times a day (BID) | ORAL | Status: DC
  Administered 2022-03-21 – 2022-03-22 (×2): 1000 mg via ORAL
  Filled 2022-03-21 (×2): qty 2

## 2022-03-21 MED ORDER — METOPROLOL SUCCINATE ER 25 MG PO TB24
25.0000 mg | ORAL_TABLET | Freq: Two times a day (BID) | ORAL | Status: DC
  Administered 2022-03-21 – 2022-04-01 (×22): 25 mg via ORAL
  Filled 2022-03-21 (×24): qty 1

## 2022-03-21 NOTE — Progress Notes (Addendum)
Courtney Heys, MD  Physician Physical Medicine and Rehabilitation   PMR Pre-admission    Signed   Date of Service: 03/21/2022 10:04 AM   Signed      Show:Clear all [x] Written[x] Templated[x] Copied  Added by: [x] Lovorn, Jinny Blossom, MD  [] Hover for details PMR Admission Coordinator Pre-Admission Assessment   Patient: David Joseph is an 82 y.o., male MRN: [id number] DOB: 1940/09/17 Height:   Weight:     Insurance Information HMO:     PPO:      PCP:      IPA:      80/20:      OTHER:  PRIMARY: Medicare a and b      Policy#: [id number]      Subscriber: pt CM Name:       Phone#:      Fax#:  Pre-Cert#:       Employer:  Benefits:  Phone #: passport one source     Name: 12/29 Eff. Date: 06/17/2007     Deduct: $ 1632      Out of Pocket Max: none      Life Max: none CIR: 100%      SNF: 20 full days Outpatient: 80%     Co-Pay: 20% Home Health: 100%      Co-Pay: none DME: 80%     Co-Pay: 20% Providers: pt choice  SECONDARY: [employer]      Policy#: [id number]   Financial Counselor:       Phone#:    The "Data Collection Information Summary" for patients in Inpatient Rehabilitation Facilities with attached "Privacy Act [place] Records" was provided and verbally reviewed with: Family, patient   Emergency Contact Information Contact Information       Name Relation Home Work Mobile    Haff,[name] Spouse [phone]   [phone]         Current Medical History  Patient Admitting Diagnosis: BKA   History of Present Illness: 82 year old male with history of prostate cancer, corticosteroid induced DM, MDS, IgA lambda MGUS,  right internal carotid stent, HFrEF 40 to 45 %, CVA, anemia, Ramsay Hunt Syndrome, RA, PAD s/p fem-pop bypass and toe amputation, afib with recent ablation who presented on 03/14/22  to [facility] with progressive pain in his right foot with tissue loss for planned BKA.  Recent R CFA and profunda endarterectomy on 02/25/22. Bypass was not felt  feasible and decision was made to proceed to amputation.    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 Postoperative pain management . Home statin and metoprolol held. On regular diet. Home hydroxychloroquine and prednisone resumed for RA. Heparin Sq for DVT prophylaxis. Hold off Reblozyl injection per heme onc. Ancef given intraoperatively. SSI for DM.    Hgb 6.7 on 1/3 form 7.4. Transfused 1 unit PRBCs without difficulty. Hgb 8.8 on 1/4.   Patient's medical record from [facility] has been reviewed by the rehabilitation admission coordinator and physician.   Past Medical History      Past Medical History:  Diagnosis Date   Anemia     Arthritis      OA   Diverticulitis      10 years ago   DVT (deep venous thrombosis) ([place]) 04/09/2015    left leg- recently   Pneumonia 13 years ago   Rheumatoid arthritis ([place])      Has the patient had major surgery during 100 days prior to admission? Yes   Family History   family history is not on file.  Current Medications No current facility-administered medications for this encounter.   Current Outpatient Medications:    predniSONE (DELTASONE) 5 MG tablet, Take 10 mg by mouth daily with breakfast., Disp: , Rfl:    Patients Current Diet: Diet Regular with thin liquids   Precautions / Restrictions Precautions: Fall Precaution Comments: NWB RLE Weight Bearing Restrictions: Yes RLE Weight Bearing: NWB   Has the patient had 2 or more falls or a fall with injury in the past year? No   Prior Activity Level Limited Community (1-2x/wk): over past few weeks RW in the home due to RLE procedures   Prior Functional Level Self Care: Did the patient need help bathing, dressing, using the toilet or eating? Independent   Indoor Mobility: Did the patient need assistance with walking from room to room (with or without device)? Independent   Stairs: Did the patient need assistance with internal or external stairs (with or without device)? Independent    Functional Cognition: Did the patient need help planning regular tasks such as shopping or remembering to take medications? Independent   Patient Information  Are you of Hispanic, Latino/a, or Spanish origin?: A. No, not of Hispanic , Latino/a, or Spanish origin What is your race?: White Do you need or want an interpreter to communicate with a doctor or health care staff? No  Patient's Response To:    Health Literacy and Transportation Is the patient able to respond to health literacy and transportation needs?: Yes Health Literacy- How often do you need to have someone help you when you read instructions, pamphlets, or other written material from your doctor or pharmacy?: Never In the past 12 months, has lack of transportation kept you from medical appointments or from getting medication?: No In the past 12 months, has lack of transportation kept you from meetings, work, or from getting things needed for daily living?: No  Home Assistive Devices / Herbalist (specify type); Shower chair with back (rollator, grab bars in shower, grab bars around toilet, wife looking into 1 step ramp inside to common area and chiar lift vs ramp for entry into home)   Prior Device Use: Indicate devices/aids used by the patient prior to current illness, exacerbation or injury? Walker     Prior Functional Level Current Functional Level  Bed Mobility   Independent Mod assist    Transfers   Independent   Mod assist    Mobility - Walk/Wheelchair   Mod Independent   Mod assist    Upper Body Dressing   Independent   Min assist    Lower Body Dressing   Independent   Mod assist    Grooming   Independent   Independent    Eating/Drinking   Independent   Independent    Toilet Transfer   Independent   Mod assist    Bladder Continence    continent   continent    Bowel Management   continent   continent    Stair Climbing   Mod Independent   Other    Communication   independent    independent    Memory   intact intact      Special Needs/ Care Considerations Fall precautions   Previous Home Environment  Living Arrangements: Spouse/significant other  Lives With: Spouse Available Help at Discharge: Family; Available 24 hours/day (wife 73 years old and very active, son out of state) Type of Home: House Home Layout: One level Home Access: Stairs to enter Entrance Stairs-Rails: Left Entrance Stairs-Number of Steps:  5 Bathroom Shower/Tub: Walk-in shower (with grab bars in shower and around toilet) Bathroom Toilet: Handicapped height Bathroom Accessibility: Yes How Accessible: Accessible via walker [place]: No Additional Comments: wife getting modifications for inside home common area has one step up as well as wife checking into outside ramp vs chairleft for entry into home    Discharge Living Setting Plans for Discharge Living Setting: Patient's home; Lives with (comment) (wife) Type of Home at Discharge: House Discharge Home Layout: One level Discharge Home Access: Stairs to enter Entrance Stairs-Rails: Right Entrance Stairs-Number of Steps: 5 Discharge Bathroom Shower/Tub: Walk-in shower (with grab bars in shower and around toilet) Discharge Bathroom Toilet: Handicapped height Discharge Bathroom Accessibility: Yes How Accessible: Accessible via walker Does the patient have any problems obtaining your medications?: No   Social/Family/Support Systems Patient Roles: Spouse Contact Information: wife Arbie Cookey Anticipated Caregiver: wife Anticipated Caregiver's Contact Information: [phone] Ability/Limitations of Caregiver: wife 75 years old and very active ( tennis) Caregiver Availability: 24/7 Discharge Plan Discussed with Primary Caregiver: Yes Is Caregiver In Agreement with Plan?: Yes Does Caregiver/Family have Issues with Lodging/Transportation while Pt is in Rehab?: No   Goals Patient/Family Goal for Rehab: Mod I to supervision with  PT and OT at wheelchair level Expected length of stay: ELOS 7 to 10 days Additional Information: wife having modifications in their home Pt/Family Agrees to Admission and willing to participate: Yes Program Orientation Provided & Reviewed with Pt/Caregiver Including Roles  & Responsibilities: Yes   Decrease burden of Care through IP rehab admission: n/a   Possible need for SNF placement upon discharge: not anticipated   Patient Condition: I have reviewed medical records from [facility], spoken with CM, and spouse. I discussed via phone for inpatient rehabilitation assessment.  Patient will benefit from ongoing PT and OT, can actively participate in 3 hours of therapy a day 5 days of the week, and can make measurable gains during the admission.  Patient will also benefit from the coordinated team approach during an Inpatient Acute Rehabilitation admission.  The patient will receive intensive therapy as well as Rehabilitation physician, nursing, social worker, and care management interventions.  Due to bladder management, bowel management, safety, skin/wound care, disease management, medication administration, pain management, and patient education the patient requires 24 hour a day rehabilitation nursing.  The patient is currently mod assist overall with mobility and basic ADLs.  Discharge setting and therapy post discharge at home with home health is anticipated.  Patient has agreed to participate in the Acute Inpatient Rehabilitation Program and will admit today.   Preadmission Screen Completed By: Danne Baxter RN MSN , 03/21/2022 10:04 AM ______________________________________________________________________   Discussed status with Dr. Dagoberto Ligas on 03/21/22 at 1000 and received approval for admission today.   Admission Coordinator:  Danne Baxter RN MSN time 1000 Date 03/21/22.    Assessment/Plan: Diagnosis: Does the need for close, 24 hr/day Medical supervision in concert with the patient's rehab  needs make it unreasonable for this patient to be served in a less intensive setting? Yes Co-Morbidities requiring supervision/potential complications: RA, prostate CA, PAD s/p fem pop bypass and profunda endarterectomy; Afib s/p ablation; prior CVA, dCHF EF 40-45%; DM Due to bladder management, bowel management, safety, skin/wound care, disease management, medication administration, pain management, and patient education, does the patient require 24 hr/day rehab nursing? Yes Does the patient require coordinated care of a physician, rehab nurse, PT, OT, and SLP to address physical and functional deficits in the context of the above medical  diagnosis(es)? Yes Addressing deficits in the following areas: balance, endurance, locomotion, strength, transferring, bowel/bladder control, bathing, dressing, feeding, grooming, and toileting Can the patient actively participate in an intensive therapy program of at least 3 hrs of therapy 5 days a week? Yes The potential for patient to make measurable gains while on inpatient rehab is good Anticipated functional outcomes upon discharge from inpatient rehab: modified independent and supervision PT, modified independent and supervision OT, n/a SLP at w/c level Estimated rehab length of stay to reach the above functional goals is: 7-10 days Anticipated discharge destination: Home 10. Overall Rehab/Functional Prognosis: good   MD Signature

## 2022-03-21 NOTE — H&P (Incomplete)
    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   HPI:    ROS   Past Medical History:  Diagnosis Date   Anemia    Arthritis    OA   Diverticulitis    10 years ago   DVT (deep venous thrombosis) ([place]) 04/09/2015   left leg- recently   Pneumonia 13 years ago   Rheumatoid arthritis [facility])     Past Surgical History:  Procedure Laterality Date   BACK SURGERY  1980   lower    basal cell areas removed  Sept 2013, 2012   left lower lip and chest   COLONOSCOPY  August 2013   EYE SURGERY  2 years ago   both cataract   JOINT REPLACEMENT  March 2012, 02/2012   right knee, redo right knee   left middle finger surgery  March 2012   PROSTATECTOMY  12/2013   at [place]  60 years ago   TOTAL KNEE ARTHROPLASTY Left 10/09/2015   Procedure: LEFT TOTAL KNEE ARTHROPLASTY;  Surgeon: Gaynelle Arabian, MD;  [facility];  Service: Orthopedics;  Laterality: Left;   TOTAL KNEE REVISION  02/19/2012   Procedure: TOTAL KNEE REVISION;  Surgeon: [name], MD;  Location: [facility];  Service: Orthopedics;  Laterality: Right;    No family history on file.   Social History:  reports that he quit smoking about 10 years ago. His smoking use included cigars. He has been exposed to tobacco smoke. He has never used smokeless tobacco. He reports current alcohol use of about 7.0 standard drinks of alcohol per week. He reports that he does not use drugs.    Allergies  Allergen Reactions   Iodinated Contrast Media Rash    Can tolerate IV dye with steroid protocol   (Not in a hospital admission)     Home:     Functional History:    Functional Status:  Mobility:          ADL:    Cognition:      Physical Exam: There were no vitals taken for this visit. Physical Exam  No results found for this or any previous visit (from the past 48 hour(s)). No results found.    There were no vitals taken for this  visit.  Medical Problem List and Plan: 1. Functional deficits secondary to ***  -patient may *** shower  -ELOS/Goals: *** 2.  Antithrombotics: -DVT/anticoagulation:  {VTE PROPHYLAXIS/ANTICOAGULATION - [id number]}  -antiplatelet therapy: *** 3. Pain Management: *** 4. Mood/Behavior/Sleep: ***  -antipsychotic agents: *** 5. Neuropsych/cognition: This patient *** capable of making decisions on *** own behalf. 6. Skin/Wound Care: *** 7. Fluids/Electrolytes/Nutrition: ***     ***  Bary Leriche, PA-C 03/21/2022

## 2022-03-21 NOTE — Progress Notes (Signed)
Patient ID: David Joseph, male   DOB: [birth year], 82 y.o.   MRN: [id number]  Pt arrived to 4W02 per PTAR.Marland Kitchen Pt assessment and vitals obtained. Oriented to rehab and policies reviewed. Pt/family in agreement. Call light in reach, bed alarm on and in lowest position. Sheela Stack, LPN

## 2022-03-21 NOTE — H&P (Addendum)
Physical Medicine and Rehabilitation Admission H&P    CC: Functional deficits due to R-BKA  HPI: David Joseph is a 82 year old male with history of T2DM, CVA, HFrEF- 40-45%,  CPPD, myelodysplastic syndrome (IgA lambda MGIUS), RA, CAF, Ramsey Hunt, CPPD/pseudogout,  PAD s/p multiple bypass with recent left great toe amputation, multiple chronic wound RLE with gangrenous changes and critical limb ischemia. He was admitted to [facility] for R-BKA on 03/14/22 by Dr. Allayne Stack. Post op had issues with fever treated with IS as well as neuropathic pain. Lyrica added to help mange neuropathic pain left foot and Eliquis resumed. Luspatercept injection to be held per Hem/Onc and and Plaquenil/prednisone resumed.  IV narcotics weaned off and H/H noted to be on downward trend to 6.7 on 01/03. He was transfused with one PRBC with improvement in Hgb to 8.8 today. Glipzide has been held during hospitalization. Pain control has improved and PT/OT has been working with patient. He continues to be limited by weakness   Pt reports took pain meds before left [facility], but pain really "flaring up" 7/10 currently.  Burning pain- mainly in LLE, not RLE, although also having phantom pain on R BKA. Also had throbbing pain in LLE when wasn't moving it a few days ago, but that's doing better.  Vomited yesterday but no N/V today- took meds on empty stomach.  Urinating OK.  LBM 1 hour ago- has been having loose stools/diarrhea- ~ 3x/day- thinks due to stool meds.   Willing to increase nerve pain meds.   Review of Systems  Constitutional:  Negative for chills and fever.       Cold sweats with sleep since surgery.  HENT:  Positive for hearing loss (baseline (recoved after [place])). Negative for congestion, ear discharge and nosebleeds.   Eyes: Negative.  Negative for blurred vision.  Respiratory:  Negative for cough and shortness of breath.   Cardiovascular:  Negative for chest pain and palpitations.   Gastrointestinal: Negative.  Negative for abdominal pain, nausea and vomiting.  Genitourinary:  Negative for dysuria, frequency and urgency.  Musculoskeletal:  Positive for neck pain (getting worse).  Neurological:  Positive for dizziness and sensory change (balance problems. RIght face still numb.).  Endo/Heme/Allergies: Negative.   Psychiatric/Behavioral:  Negative for depression, substance abuse and suicidal ideas. The patient has insomnia.   All other systems reviewed and are negative.    Past Medical History:  Diagnosis Date   Anemia    Arthritis    OA   Calcium pyrophosphate deposition disease (CPPD)    Diverticulitis    10 years ago   DVT (deep venous thrombosis) ([place]) 04/09/2015   left leg- recently   Heart failure ([place])    Pneumonia 13 years ago   Presence of internal carotid stent    Prostate CA ([place])    Rheumatoid arthritis ([place])     Past Surgical History:  Procedure Laterality Date   BACK SURGERY  03/18/1978   lower    basal cell areas removed  Sept 2013, 2012   left lower lip and chest   CERVICAL FUSION     COLONOSCOPY  10/17/2011   EYE SURGERY  2 years ago   both cataract   JOINT REPLACEMENT  March 2012, 02/2012   right knee, redo right knee   left middle finger surgery  05/17/2010   PROSTATECTOMY  12/16/2013   at [facility]  60 years ago   TOTAL KNEE ARTHROPLASTY Left 10/09/2015  Procedure: LEFT TOTAL KNEE ARTHROPLASTY;  Surgeon: Gaynelle Arabian, MD;  Location: [facility];  Service: Orthopedics;  Laterality: Left;   TOTAL KNEE REVISION  02/19/2012   Procedure: TOTAL KNEE REVISION;  Surgeon: [name], MD;  Location: [facility];  Service: Orthopedics;  Laterality: Right;    Family History  Problem Relation Age of Onset   Heart disease Mother    Anemia Father    Multiple sclerosis Brother     Social History:  Married. Was Archivist and taught school for 6 years. He now turns wood in his shop at home he  reports that he quit smoking  about 10 years ago. His smoking use included cigars. He has been exposed to tobacco smoke. He has never used smokeless tobacco. He reports current alcohol use of about 7.0 standard drinks of alcohol per week. He reports that he does not use drugs.   Allergies  Allergen Reactions   Iodinated Contrast Media Rash    Can tolerate IV dye with steroid protocol    Medications Prior to Admission  Medication Sig Dispense Refill   predniSONE (DELTASONE) 5 MG tablet Take 10 mg by mouth daily with breakfast.        Home:One level home with 5 STE Home Living Family/patient expects to be discharged to:: Private residence Living Arrangements: Spouse/significant other   Functional History: Independent with walker PTA.   Functional Status:  Mobility: Mod assist for bed mobility Mod assist for transfers Mod assist for mobility.       ADL: Min assist for UB care  Mod assist for LB care  Cognition: Cognition Orientation Level: Oriented X4      Blood pressure (!) 119/53, pulse 75, temperature 97.8 F (36.6 C), resp. rate 16, SpO2 100 %. Physical Exam Vitals and nursing note reviewed. Exam conducted with a chaperone present.  Constitutional:      Appearance: He is underweight.     Comments: Thin male Laying in bed; wife at bedside; awake, alert, appropriate, NAD  HENT:     Head: Normocephalic and atraumatic.     Right Ear: External ear normal.     Left Ear: External ear normal.     Nose: Nose normal. No congestion.     Mouth/Throat:     Mouth: Mucous membranes are dry.     Pharynx: Oropharynx is clear. No oropharyngeal exudate.  Eyes:     General:        Right eye: No discharge.        Left eye: No discharge.     Extraocular Movements: Extraocular movements intact.  Cardiovascular:     Rate and Rhythm: Normal rate and regular rhythm.     Heart sounds: Normal heart sounds. No murmur heard.    No gallop.  Pulmonary:     Effort: Pulmonary effort is normal. No respiratory  distress.     Breath sounds: Normal breath sounds. No wheezing or rales.  Abdominal:     General: Bowel sounds are normal. There is no distension.     Palpations: Abdomen is soft.     Tenderness: There is no abdominal tenderness.  Musculoskeletal:     Cervical back: Neck supple. No tenderness.     Comments: UE strength 4+/5 B/L- weaker than expected R HF and KE 4/5- limited by pain LLE_ HF 3-/5; KE 3+/5; DF 4/5; PF 4-/5  Has L olecranon bursitis- skin not open  Skin:    General: Skin is warm and dry.  Comments: Incision on R BKA with staples intact- looks good- no drainage, no erythema  Neurological:     Mental Status: He is oriented to person, place, and time.     Comments: Decreased to light touch from mid thighs down B/L  Psychiatric:        Mood and Affect: Mood normal.        Behavior: Behavior normal.     Lab values 03/20/22: CBC Hgb 6.8   Hct-19.0   WBC  -4.8  Plt- 117 BMET: Na-131  K- 3.8   Cl-101   CO2- 26   BUN- 23   SCr- 0.47  Ca-7.3        Blood pressure (!) 119/53, pulse 75, temperature 97.8 F (36.6 C), resp. rate 16, SpO2 100 %.  Medical Problem List and Plan: 1. Functional deficits secondary to  R BKA due to wound that wouldn't heal after R CFA/profunda endarterectomy  -patient may  shower if cover incision of R BKA  -ELOS/Goals: 7-10 days at w/c level 2.  Antithrombotics: -DVT/anticoagulation:  Pharmaceutical: Eliquis  -antiplatelet therapy: N/A3  3. Pain Management:  oxycodone prn.   --Lyrica ITD-->75 mg, 50 mg 75 mg. Might benefit from increasing Lyrica due to nerve pain on B/L LE's AND phantom pain  4. Mood/Behavior/Sleep: LCSW to follow for evaluation and support.   -antipsychotic agents: N/A  --trazodone prn for insomnia.  5. Neuropsych/cognition: This patient is capable of making decisions on his own behalf. 6. Skin/Wound Care: Routine pressure relief measures.  --monitor incision for healing.  --protein supplement to promote wound healing.   7. Fluids/Electrolytes/Nutrition: Monitor I/O. Check CMET in am  --add protein supplement.  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   --will check A1C in am.   --continue to hold glipizide and resume if elevated.  9. Afib w/RVR: Diagnosed 11/2021 s/p ablation 02/12/22 by Dr. Candis Musa.  --Monitor HR TID.    --Continue metoprolol and Eliquis. -in NSR  10. Persistent hyponatremia:Recheck Na level in am.  11. IgA Lambda MGUS w/7% sideroblasts : Thrombocytopenia stable.   --was getting transfusion PTA due to illness.  --monitor for any signs of bleeding.  12. Acute on chronic anemia: Improved post transfusion w/one unit PRBC up to 8.8 from 6.7 yesterday.   --monitor for signs of bleeding.  13. Congestive HFrEF: Heart healthy diet. Continue Lipitor and metoprolol.  14. Ramsey Hunt syndrome: Continue Valtrex daily.  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  16. Loose stools- have stopped pt's scheduled bowel meds- isn't on ABX- might Benefit from fiber if things don't improve.    I have personally performed a face to face diagnostic evaluation of this patient and formulated the key components of the plan.  Additionally, I have personally reviewed laboratory data, imaging studies, as well as relevant notes and concur with the physician assistant's documentation above.   The patient's status has not changed from the original H&P.  Any changes in documentation from the acute care chart have been noted above.       Bary Leriche, PA-C 03/21/2022

## 2022-03-21 NOTE — PMR Pre-admission (Signed)
PMR Admission Coordinator Pre-Admission Assessment  Patient: David Joseph is an 82 y.o., male MRN: [id number] DOB: 03-10-1941 Height:   Weight:    Insurance Information HMO:     PPO:      PCP:      IPA:      80/20:      OTHER:  PRIMARY: Medicare a and b      Policy#: [id number]      Subscriber: pt CM Name:       Phone#:      Fax#:  Pre-Cert#:       Employer:  Benefits:  Phone #: passport one source     Name: 12/29 Eff. Date: 06/17/2007     Deduct: $ 1632      Out of Pocket Max: none      Life Max: none CIR: 100%      SNF: 20 full days Outpatient: 80%     Co-Pay: 20% Home Health: 100%      Co-Pay: none DME: 80%     Co-Pay: 20% Providers: pt choice  SECONDARY: [employer]      Policy#: [id number]  Financial Counselor:       Phone#:   The "Data Collection Information Summary" for patients in Inpatient Rehabilitation Facilities with attached "Privacy Act [place] Records" was provided and verbally reviewed with: Family  Emergency Contact Information Contact Information     Name Relation Home Work Mobile   Joseph,David Spouse [phone]  [phone]      Current Medical History  Patient Admitting Diagnosis: BKA  History of Present Illness: 82 year old male with history of prostate cancer, corticosteroid induced DM, MDS, IgA lambda MGUS,  right internal carotid stent, HFrEF 40 to 45 %, CVA, anemia, Ramsay Hunt Syndrome, RA, PAD s/p fem-pop bypass and toe amputation, afib with recent ablation who presented on 03/14/22  to [facility] with progressive pain in his right foot with tissue loss for planned BKA.  Recent R CFA and profunda endarterectomy on 02/25/22. Bypass was not felt feasible and decision was made to proceed to amputation.   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 Postoperative pain management . Home statin and metoprolol held. On regular diet. Home hydroxychloroquine and prednisone resumed for RA. Heparin Sq for DVT prophylaxis. Hold off Reblozyl injection per  heme onc. Ancef given intraoperatively. SSI for DM.   Hgb 6.7 on 1/3 form 7.4. Transfused 1 unit PRBCs without difficulty. Hgb 8.8 on 1/4.  Patient's medical record from [facility] has been reviewed by the rehabilitation admission coordinator and physician.  Past Medical History  Past Medical History:  Diagnosis Date   Anemia    Arthritis    OA   Diverticulitis    10 years ago   DVT (deep venous thrombosis) ([place]) 04/09/2015   left leg- recently   Pneumonia 13 years ago   Rheumatoid arthritis ([place])    Has the patient had major surgery during 100 days prior to admission? Yes  Family History   family history is not on file.  Current Medications No current facility-administered medications for this encounter.  Current Outpatient Medications:    predniSONE (DELTASONE) 5 MG tablet, Take 10 mg by mouth daily with breakfast., Disp: , Rfl:   Patients Current Diet: Diet Regular with thin liquids  Precautions / Restrictions Precautions: Fall Precaution Comments: NWB RLE Weight Bearing Restrictions: Yes RLE Weight Bearing: NWB   Has the patient had 2 or more falls or a fall with injury in the past  year? No  Prior Activity Level Limited Community (1-2x/wk): over past few weeks RW in the home due to RLE procedures  Prior Functional Level Self Care: Did the patient need help bathing, dressing, using the toilet or eating? Independent  Indoor Mobility: Did the patient need assistance with walking from room to room (with or without device)? Independent  Stairs: Did the patient need assistance with internal or external stairs (with or without device)? Independent  Functional Cognition: Did the patient need help planning regular tasks such as shopping or remembering to take medications? Independent  Patient Information    Patient's Response To:     Home Assistive Devices / Herbalist (specify type); Shower chair with back (rollator, grab bars in shower, grab bars around  toilet, wife looking into 1 step ramp inside to common area and chiar lift vs ramp for entry into home)  Prior Device Use: Indicate devices/aids used by the patient prior to current illness, exacerbation or injury? Walker   Prior Functional Level Current Functional Level  Bed Mobility  Independent Mod assist   Transfers  Independent  Mod assist   Mobility - Walk/Wheelchair  Mod Independent  Mod assist   Upper Body Dressing  Independent  Min assist   Lower Body Dressing  Independent  Mod assist   Grooming  Independent  Independent   Eating/Drinking  Independent  Independent   Toilet Transfer  Independent  Mod assist   Bladder Continence   continent  continent   Bowel Management  continent  continent   Stair Climbing  Mod Independent  Other   Communication  independent  independent   Memory  intact intact     Special Needs/ Care Considerations Fall precautions  Previous Home Environment  Living Arrangements: Spouse/significant other  Lives With: Spouse Available Help at Discharge: Family; Available 24 hours/day (wife 19 years old and very active, son out of state) Type of Home: House Home Layout: One level Home Access: Stairs to enter Entrance Stairs-Rails: Left Entrance Stairs-Number of Steps: 5 Bathroom Shower/Tub: Walk-in shower (with grab bars in shower and around toilet) Bathroom Toilet: Handicapped height Bathroom Accessibility: Yes How Accessible: Accessible via walker [place]: No Additional Comments: wife getting modifications for inside home common area has one step up as well as wife checking into outside ramp vs chairleft for entry into home   Discharge Living Setting Plans for Discharge Living Setting: Patient's home; Lives with (comment) (wife) Type of Home at Discharge: House Discharge Home Layout: One level Discharge Home Access: Stairs to enter Entrance Stairs-Rails: Right Entrance Stairs-Number of Steps:  5 Discharge Bathroom Shower/Tub: Walk-in shower (with grab bars in shower and around toilet) Discharge Bathroom Toilet: Handicapped height Discharge Bathroom Accessibility: Yes How Accessible: Accessible via walker Does the patient have any problems obtaining your medications?: No  Social/Family/Support Systems Patient Roles: Spouse Contact Information: wife Arbie Cookey Anticipated Caregiver: wife Anticipated Caregiver's Contact Information: [phone] Ability/Limitations of Caregiver: wife 66 years old and very active ( tennis) Caregiver Availability: 24/7 Discharge Plan Discussed with Primary Caregiver: Yes Is Caregiver In Agreement with Plan?: Yes Does Caregiver/Family have Issues with Lodging/Transportation while Pt is in Rehab?: No  Goals Patient/Family Goal for Rehab: Mod I to supervision with PT and OT at wheelchair level Expected length of stay: ELOS 7 to 10 days Additional Information: wife having modifications in their home Pt/Family Agrees to Admission and willing to participate: Yes Program Orientation Provided & Reviewed with Pt/Caregiver Including Roles  & Responsibilities: Yes  Decrease burden  of Care through IP rehab admission: n/a  Possible need for SNF placement upon discharge: not anticipated  Patient Condition: I have reviewed medical records from [facility], spoken with CM, and spouse. I discussed via phone for inpatient rehabilitation assessment.  Patient will benefit from ongoing PT and OT, can actively participate in 3 hours of therapy a day 5 days of the week, and can make measurable gains during the admission.  Patient will also benefit from the coordinated team approach during an Inpatient Acute Rehabilitation admission.  The patient will receive intensive therapy as well as Rehabilitation physician, nursing, social worker, and care management interventions.  Due to bladder management, bowel management, safety, skin/wound care, disease management, medication  administration, pain management, and patient education the patient requires 24 hour a day rehabilitation nursing.  The patient is currently mod assist overall with mobility and basic ADLs.  Discharge setting and therapy post discharge at home with home health is anticipated.  Patient has agreed to participate in the Acute Inpatient Rehabilitation Program and will admit today.  Preadmission Screen Completed By: Courtney Heys, 03/21/2022 10:04 AM ______________________________________________________________________   Discussed status with Dr. Dagoberto Ligas on 03/21/22 at 1000 and received approval for admission today.  Admission Coordinator:  Courtney Heys, MD, time 1000 Date 03/21/22.   Assessment/Plan: Diagnosis: Does the need for close, 24 hr/day Medical supervision in concert with the patient's rehab needs make it unreasonable for this patient to be served in a less intensive setting? Yes Co-Morbidities requiring supervision/potential complications: RA, prostate CA, PAD s/p fem pop bypass and profunda endarterectomy; Afib s/p ablation; prior CVA, dCHF EF 40-45%; DM Due to bladder management, bowel management, safety, skin/wound care, disease management, medication administration, pain management, and patient education, does the patient require 24 hr/day rehab nursing? Yes Does the patient require coordinated care of a physician, rehab nurse, PT, OT, and SLP to address physical and functional deficits in the context of the above medical diagnosis(es)? Yes Addressing deficits in the following areas: balance, endurance, locomotion, strength, transferring, bowel/bladder control, bathing, dressing, feeding, grooming, and toileting Can the patient actively participate in an intensive therapy program of at least 3 hrs of therapy 5 days a week? Yes The potential for patient to make measurable gains while on inpatient rehab is good Anticipated functional outcomes upon discharge from inpatient rehab: modified independent  and supervision PT, modified independent and supervision OT, n/a SLP at w/c level Estimated rehab length of stay to reach the above functional goals is: 7-10 days Anticipated discharge destination: Home 10. Overall Rehab/Functional Prognosis: good  MD Signature

## 2022-03-22 DIAGNOSIS — I5022 Chronic systolic (congestive) heart failure: Secondary | ICD-10-CM

## 2022-03-22 DIAGNOSIS — E1142 Type 2 diabetes mellitus with diabetic polyneuropathy: Secondary | ICD-10-CM | POA: Diagnosis not present

## 2022-03-22 DIAGNOSIS — E871 Hypo-osmolality and hyponatremia: Secondary | ICD-10-CM | POA: Diagnosis not present

## 2022-03-22 DIAGNOSIS — G546 Phantom limb syndrome with pain: Secondary | ICD-10-CM

## 2022-03-22 DIAGNOSIS — Z89511 Acquired absence of right leg below knee: Secondary | ICD-10-CM | POA: Diagnosis not present

## 2022-03-22 DIAGNOSIS — D649 Anemia, unspecified: Secondary | ICD-10-CM

## 2022-03-22 DIAGNOSIS — S88111D Complete traumatic amputation at level between knee and ankle, right lower leg, subsequent encounter: Principal | ICD-10-CM

## 2022-03-22 DIAGNOSIS — R195 Other fecal abnormalities: Secondary | ICD-10-CM | POA: Diagnosis not present

## 2022-03-22 DIAGNOSIS — M792 Neuralgia and neuritis, unspecified: Secondary | ICD-10-CM

## 2022-03-22 LAB — COMPREHENSIVE METABOLIC PANEL
ALT: 35 U/L (ref 0–44)
AST: 26 U/L (ref 15–41)
Albumin: 2.2 g/dL — ABNORMAL LOW (ref 3.5–5.0)
Alkaline Phosphatase: 110 U/L (ref 38–126)
Anion gap: 8 (ref 5–15)
BUN: 13 mg/dL (ref 8–23)
CO2: 25 mmol/L (ref 22–32)
Calcium: 7.6 mg/dL — ABNORMAL LOW (ref 8.9–10.3)
Chloride: 97 mmol/L — ABNORMAL LOW (ref 98–111)
Creatinine, Ser: 0.61 mg/dL (ref 0.61–1.24)
GFR, Estimated: >60 mL/min (ref >60)
Glucose, Bld: 122 mg/dL — ABNORMAL HIGH (ref 70–99)
Potassium: 3.8 mmol/L (ref 3.5–5.1)
Sodium: 130 mmol/L — ABNORMAL LOW (ref 135–145)
Total Bilirubin: 2.4 mg/dL — ABNORMAL HIGH (ref 0.3–1.2)
Total Protein: 5 g/dL — ABNORMAL LOW (ref 6.5–8.1)

## 2022-03-22 LAB — CBC WITH DIFFERENTIAL/PLATELET
Abs Immature Granulocytes: 0.07 10*3/uL (ref 0.00–0.07)
Basophils Absolute: 0 10*3/uL (ref 0.0–0.1)
Basophils Relative: 0 %
Eosinophils Absolute: 0 10*3/uL (ref 0.0–0.5)
Eosinophils Relative: 0 %
HCT: 25.4 % — ABNORMAL LOW (ref 39.0–52.0)
Hemoglobin: 8.9 g/dL — ABNORMAL LOW (ref 13.0–17.0)
Immature Granulocytes: 2 %
Lymphocytes Relative: 10 %
Lymphs Abs: 0.5 10*3/uL — ABNORMAL LOW (ref 0.7–4.0)
MCH: 35.5 pg — ABNORMAL HIGH (ref 26.0–34.0)
MCHC: 35 g/dL (ref 30.0–36.0)
MCV: 101.2 fL — ABNORMAL HIGH (ref 80.0–100.0)
Monocytes Absolute: 0.2 10*3/uL (ref 0.1–1.0)
Monocytes Relative: 5 %
Neutro Abs: 3.8 10*3/uL (ref 1.7–7.7)
Neutrophils Relative %: 83 %
Platelets: 155 10*3/uL (ref 150–400)
RBC: 2.51 MIL/uL — ABNORMAL LOW (ref 4.22–5.81)
RDW: 22.6 % — ABNORMAL HIGH (ref 11.5–15.5)
WBC: 4.6 10*3/uL (ref 4.0–10.5)
nRBC: 0 % (ref 0.0–0.2)

## 2022-03-22 LAB — GLUCOSE, CAPILLARY
Glucose-Capillary: 111 mg/dL — ABNORMAL HIGH (ref 70–99)
Glucose-Capillary: 217 mg/dL — ABNORMAL HIGH (ref 70–99)
Glucose-Capillary: 228 mg/dL — ABNORMAL HIGH (ref 70–99)
Glucose-Capillary: 137 mg/dL — ABNORMAL HIGH (ref 70–99)

## 2022-03-22 LAB — HEMOGLOBIN A1C
Hgb A1c MFr Bld: 6.1 % — ABNORMAL HIGH (ref 4.8–5.6)
Mean Plasma Glucose: 128 mg/dL

## 2022-03-22 MED ORDER — CALCIUM CITRATE 950 (200 CA) MG PO TABS
200.0000 mg | ORAL_TABLET | Freq: Every day | ORAL | Status: DC
  Administered 2022-03-22 – 2022-04-08 (×18): 200 mg via ORAL
  Filled 2022-03-22 (×18): qty 1

## 2022-03-22 MED ORDER — VALACYCLOVIR HCL 500 MG PO TABS
500.0000 mg | ORAL_TABLET | Freq: Every day | ORAL | Status: DC
  Administered 2022-03-23 – 2022-04-08 (×17): 500 mg via ORAL
  Filled 2022-03-22 (×18): qty 1

## 2022-03-22 MED ORDER — SODIUM CHLORIDE 1 G PO TABS
1.0000 g | ORAL_TABLET | Freq: Three times a day (TID) | ORAL | Status: DC
  Administered 2022-03-22 – 2022-04-08 (×50): 1 g via ORAL
  Filled 2022-03-22 (×50): qty 1

## 2022-03-22 NOTE — Progress Notes (Signed)
[facility] Individual Statement of Services  Patient Name:  David Joseph  Date:  03/22/2022  Welcome to the [facility].  Our goal is to provide you with an individualized program based on your diagnosis and situation, designed to meet your specific needs.  With this comprehensive rehabilitation program, you will be expected to participate in at least 3 hours of rehabilitation therapies Monday-Friday, with modified therapy programming on the weekends.  Your rehabilitation program will include the following services:  Physical Therapy (PT), Occupational Therapy (OT), 24 hour per day rehabilitation nursing, Therapeutic Recreaction (TR), Care Coordinator, Rehabilitation Medicine, Nutrition Services, and Pharmacy Services  Weekly team conferences will be held on Wednesday to discuss your progress.  Your Inpatient Rehabilitation Care Coordinator will talk with you frequently to get your input and to update you on team discussions.  Team conferences with you and your family in attendance may also be held.  Expected length of stay: 12-14 days  Overall anticipated outcome: CGA-independent level  Depending on your progress and recovery, your program may change. Your Inpatient Rehabilitation Care Coordinator will coordinate services and will keep you informed of any changes. Your Inpatient Rehabilitation Care Coordinator's name and contact numbers are listed  below.  The following services may also be recommended but are not provided by the [facility]:   [place] will be made to provide these services after discharge if needed.  Arrangements include referral to agencies that provide these services.  Your insurance has been verified to be:  [place] primary doctor is:  Stana Bunting  Pertinent information will be shared with your doctor  and your insurance company.  Inpatient Rehabilitation Care Coordinator:  Ovidio Kin, [place] or ([phone]  Information discussed with and copy given to patient by: Elease Hashimoto, 03/22/2022, 10:30 AM

## 2022-03-22 NOTE — Progress Notes (Signed)
Inpatient Rehabilitation Admission Medication Review by a Pharmacist  A complete drug regimen review was completed for this patient to identify any potential clinically significant medication issues.  High Risk Drug Classes Is patient taking? Indication by Medication  Antipsychotic Yes, as an intravenous medication Prochlorperazine - PRN nausea/vomiting  Anticoagulant Yes Apixaban - afib  Antibiotic Yes Valtrex - prophylaxis while on IS  Opioid Yes Oxycodone, tramadol - PRN pain  Antiplatelet No   Hypoglycemics/insulin Yes Insulin aspart - DM  Vasoactive Medication Yes Metoprolol - HTN/Afib  Chemotherapy No   Other Yes Atorvastatin - HLD Vit C, NaCl tabs - supplement Hydroxychloroquine, prednisone - hx CPPD Pregabalin - neuropathy Bisacodyl, MIralax - PRN constipation Duloxetine - mood Pantoprazole - GERD ppx     Type of Medication Issue Identified Description of Issue Recommendation(s)  Drug Interaction(s) (clinically significant)     Duplicate Therapy     Allergy     No Medication Administration End Date     Incorrect Dose     Additional Drug Therapy Needed     Significant med changes from prior encounter (inform family/care partners about these prior to discharge). Reblozyl injection - per heme/onc, hold for now  Vitamin D, glipizide - not resumed on admission Communicate medication changes with patient/family at discharge  Other       Clinically significant medication issues were identified that warrant physician communication and completion of prescribed/recommended actions by midnight of the next day:  Yes discussed and addressed   Name of provider notified for urgent issues identified: Dr. Curlene Dolphin   Provider Method of Notification: secure mesage    Pharmacist comments: n/a   Time spent performing this drug regimen review (minutes): 20   Thank you for allowing pharmacy to be a part of this patient's care.  Ardyth Harps, PharmD Clinical  Pharmacist

## 2022-03-22 NOTE — Progress Notes (Signed)
Patient ID: David Joseph, male   DOB: 1941/03/07, 82 y.o.   MRN: [id number] Met with the patient to review current situation, rehab process, team conference and plan of care. Discussed phantom pain management; lyrica dose adjusted per PAC.  Reports feeling little weak; Hgb 8.9; post transfusion 03/20/22.  Wearing limb guard on right limb with [facility] boot on left foot post toe amputation. Reports diarrhea noted previously has resolved. Continue to follow along to address educational needs to facilitate preparation for discharge. David Joseph

## 2022-03-22 NOTE — Progress Notes (Addendum)
PROGRESS NOTE   Subjective/Complaints: No new concerns or complaints this AM. Reports pain is overall under control. Reports he had diarrhea for a few days and this has improved. Appetite improved today.   ROS: Hearing loss-chronic, neck pain, R face numbness, no CP or SOB, No N/V  Objective:   No results found. Recent Labs    03/22/22 0541  WBC 4.6  HGB 8.9*  HCT 25.4*  PLT 155   Recent Labs    03/22/22 0541  NA 130*  K 3.8  CL 97*  CO2 25  GLUCOSE 122*  BUN 13  CREATININE 0.61  CALCIUM 7.6*   No intake or output data in the 24 hours ending 03/22/22 1749      Physical Exam: Vital Signs Blood pressure (!) 118/58, pulse 73, temperature 97.6 F (36.4 C), resp. rate 15, height '5\' 8"'  (1.727 m), weight 66.2 kg, SpO2 100 %.  General: Alert and oriented x 3, No apparent distress HEENT: Head is normocephalic, atraumatic, MMM Heart: Reg rate and rhythm. No murmurs rubs or gallops Chest: CTA bilaterally without wheezes, rales, or rhonchi; no distress Abdomen: Soft, non-tender, non-distended, bowel sounds positive. Extremities: No clubbing, cyanosis, or edema. Pulses are 2+ Psych: Pt's affect is appropriate. Pt is cooperative Skin: Clean and dry dressing R BKA   Neuro:   Mental Status: He is oriented to person, place, and time.     Comments: Decreased to light touch from mid thighs down B/L  Musculoskeletal:     Cervical back: Neck supple. No tenderness.     Comments: UE strength 4+/5 B/L- weaker than expected R HF and KE 4/5- limited by pain LLE_ HF 3-/5; KE 3+/5; DF 4/5; PF 4-/5   Has L olecranon bursitis- skin not open     Assessment/Plan: 1. Functional deficits which require 3+ hours per day of interdisciplinary therapy in a comprehensive inpatient rehab setting. Physiatrist is providing close team supervision and 24 hour management of active medical problems listed below. Physiatrist and rehab team  continue to assess barriers to discharge/monitor patient progress toward functional and medical goals  Care Tool:  Bathing              Bathing assist       Upper Body Dressing/Undressing Upper body dressing        Upper body assist      Lower Body Dressing/Undressing Lower body dressing            Lower body assist       Toileting Toileting    Toileting assist       Transfers Chair/bed transfer  Transfers assist           Locomotion Ambulation   Ambulation assist              Walk 10 feet activity   Assist           Walk 50 feet activity   Assist           Walk 150 feet activity   Assist           Walk 10 feet on uneven surface  activity   Assist  Wheelchair     Assist               Wheelchair 50 feet with 2 turns activity    Assist            Wheelchair 150 feet activity     Assist          Blood pressure (!) 118/58, pulse 73, temperature 97.6 F (36.4 C), resp. rate 15, height '5\' 8"'  (1.727 m), weight 66.2 kg, SpO2 100 %.   Medical Problem List and Plan: 1. Functional deficits secondary to  R BKA due to wound that wouldn't heal after R CFA/profunda endarterectomy             -patient may  shower if cover incision of R BKA             -ELOS/Goals: 7-10 days at w/c level  -Continue CIR, therapy evals 2.  Antithrombotics: -DVT/anticoagulation:  Pharmaceutical: Eliquis             -antiplatelet therapy: N/A3  3. Pain Management:  oxycodone prn.              --Lyrica ITD-->75 mg, 50 mg 75 mg. Might benefit from increasing Lyrica due to nerve pain on B/L LE's AND phantom pain   -1/5 Pt reports nerve pain and phantom pain under control, will continue current medications and monitor 4. Mood/Behavior/Sleep: LCSW to follow for evaluation and support.              -antipsychotic agents: N/A             --trazodone prn for insomnia.  5. Neuropsych/cognition: This patient  is capable of making decisions on his own behalf. 6. Skin/Wound Care: Routine pressure relief measures.             --monitor incision for healing.             --protein supplement to promote wound healing.  7. Fluids/Electrolytes/Nutrition: Monitor I/O. Check CMET in am             --add protein supplement.  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              --will check A1C in am.              --continue to hold glipizide and resume if elevated.   -1/5 CBGs stable, continue to monitor trend  CBG (last 3)  Recent Labs    03/21/22 1637 03/21/22 2115 03/22/22 0654  GLUCAP 202* 142* 111*    9. Afib w/RVR: Diagnosed 11/2021 s/p ablation 02/12/22 by Dr. Candis Musa.  --Monitor HR TID.               --Continue metoprolol and Eliquis. -in NSR  10. Persistent hyponatremia:Recheck Na level in am.   -1/5 Salt tabs started, recheck BMP tomorrow 11. IgA Lambda MGUS w/7% sideroblasts : Thrombocytopenia stable.   --was getting transfusion PTA due to illness.  --monitor for any signs of bleeding.  12. Acute on chronic anemia: Improved post transfusion w/one unit PRBC up to 8.8 from 6.7 yesterday.              -1/5 HGB stable at 8.9 13. Congestive HFrEF: Heart healthy diet. Continue Lipitor and metoprolol. Daily weight.  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  15. CPPD/RA: On Plaquenil and prednisone (  has not tolerated prednisone below 15 mg)  - has associated L olecranon bursitis- will need to follow- might benefit from Ortho draining; per primary rehab physician.  16. Loose stools- have stopped pt's scheduled bowel meds- isn't on ABX- might Benefit from fiber if things don't improve.  -1/5 reports improved, continue to monitor      LOS: 1 days A FACE TO FACE EVALUATION WAS PERFORMED  Jennye Boroughs 03/22/2022, 8:22 AM

## 2022-03-22 NOTE — Evaluation (Signed)
Occupational Therapy Assessment and Plan  Patient Details  Name: David Joseph MRN: [id number] Date of Birth: May 25, 1940  OT Diagnosis: acute pain, muscle weakness (generalized), decreased activity tolerance, and decreased balance strategies Rehab Potential: Rehab Potential (ACUTE ONLY): Good ELOS: 12-14 days   Today's Date: 03/22/2022 OT Individual Time: 5329-9242 OT Individual Time Calculation (min): 67 min     Hospital Problem: Principal Problem:   Above knee amputation of right lower extremity ([place]) Active Problems:   Acute blood loss anemia ([facility])   Olecranon bursitis of left elbow   Chronic systolic congestive heart failure (HCC)   Anemia   Past Medical History:  Past Medical History:  Diagnosis Date   Anemia    Arthritis    OA   Calcium pyrophosphate deposition disease (CPPD)    Diverticulitis    10 years ago   DVT (deep venous thrombosis) ([place]) 04/09/2015   left leg- recently   Heart failure ([place])    Pneumonia 13 years ago   Presence of internal carotid stent    Prostate CA ([place])    Rheumatoid arthritis ([place])    Past Surgical History:  Past Surgical History:  Procedure Laterality Date   BACK SURGERY  03/18/1978   lower    basal cell areas removed  Sept 2013, 2012   left lower lip and chest   CERVICAL FUSION     COLONOSCOPY  10/17/2011   EYE SURGERY  2 years ago   both cataract   JOINT REPLACEMENT  March 2012, 02/2012   right knee, redo right knee   left middle finger surgery  05/17/2010   PROSTATECTOMY  12/16/2013   at [place]  60 years ago   TOTAL KNEE ARTHROPLASTY Left 10/09/2015   Procedure: LEFT TOTAL KNEE ARTHROPLASTY;  Surgeon: Gaynelle Arabian, MD;  Location: [facility];  Service: Orthopedics;  Laterality: Left;   TOTAL KNEE REVISION  02/19/2012   Procedure: TOTAL KNEE REVISION;  Surgeon: [name], MD;  Location: [facility];  Service: Orthopedics;  Laterality: Right;    Assessment & Plan Clinical Impression: Patient is a 82 year  old male with history of T2DM, CVA, HFrEF- 40-45%,  CPPD, myelodysplastic syndrome (IgA lambda MGIUS), RA, CAF, Ramsey Hunt, CPPD/pseudogout,  PAD s/p multiple bypass with recent left great toe amputation, multiple chronic wound RLE with gangrenous changes and critical limb ischemia. He was admitted to [facility] for R-BKA on 03/14/22 by Dr. Allayne Stack. Post op had issues with fever treated with IS as well as neuropathic pain. Patient transferred to CIR on 03/21/2022 .    Patient currently requires overall mod A with basic self-care skills secondary to muscle weakness, decreased cardiorespiratoy endurance, and decreased standing balance and decreased balance strategies.  Prior to hospitalization, patient could complete all ADLs with modified independence, most recently using RW.  Patient will benefit from skilled intervention to increase independence with basic self-care skills prior to discharge home with care partner.  Anticipate patient will require intermittent supervision and follow up home health.  OT - End of Session Activity Tolerance: Tolerates < 10 min activity, no significant change in vital signs Endurance Deficit: Yes OT Assessment Rehab Potential (ACUTE ONLY): Good OT Barriers to Discharge: Wound Care OT Patient demonstrates impairments in the following area(s): Balance;Endurance;Pain;Safety;Motor OT Basic ADL's Functional Problem(s): Bathing;Dressing;Toileting OT Advanced ADL's Functional Problem(s): Simple Meal Preparation OT Transfers Functional Problem(s): Toilet;Tub/Shower OT Plan OT Intensity: Minimum of 1-2 x/day, 45 to 90 minutes OT Frequency: 5 out of 7 days  OT Duration/Estimated Length of Stay: 12-14 days OT Treatment/Interventions: Balance/vestibular training;Discharge planning;DME/adaptive equipment instruction;Functional mobility training;Pain management;Patient/family education;Psychosocial support;Self Care/advanced ADL retraining;Skin care/wound managment;Therapeutic  Activities;Therapeutic Exercise;UE/LE Strength taining/ROM;UE/LE Coordination activities OT Basic Self-Care Anticipated Outcome(s): Mod I OT Toileting Anticipated Outcome(s): Mod I OT Bathroom Transfers Anticipated Outcome(s): Mod I OT Recommendation Recommendations for Other Services: Therapeutic Recreation consult Therapeutic Recreation Interventions: Stress management Patient destination: Home Follow Up Recommendations: Home health OT Equipment Recommended: To be determined   OT Evaluation Precautions/Restrictions  Precautions Precautions: Fall Precaution Comments: NWB RLE Required Braces or Orthoses:  (R) Limb guard, (L) off-loading shoe Restrictions Weight Bearing Restrictions: Yes RLE Weight Bearing: Non weight bearing Other Position/Activity Restrictions: R BKA with limb guard and L off-loading shoe with toe protection. General Chart Reviewed: Yes Family/Caregiver Present: No Pain Pain Assessment Pain Scale: 0-10 Pain Score: 4  Pain Type: Surgical pain Pain Location: Other (Comment) (Residual limb) Pain Orientation: Right Pain Descriptors / Indicators: Burning Pain Onset: On-going Pain Intervention(s): Medication (See eMAR);Rest Home Living/Prior Functioning Home Living Family/patient expects to be discharged to:: Private residence Living Arrangements: Spouse/significant other Available Help at Discharge: Family (Wife unable to assist physically due to shoulder pain.) Type of Home: House Home Access: Stairs to enter (Patient reports getting a lift vs ramp.) Entrance Stairs-Number of Steps: 5 Entrance Stairs-Rails: Left Home Layout: One level Bathroom Shower/Tub: Walk-in shower (Grab bars, rainhead shower with removable head, shower seat) Bathroom Toilet: Handicapped height Bathroom Accessibility: Yes  Lives With: Spouse IADL History Homemaking Responsibilities: Yes Meal Prep Responsibility: Secondary Current License: Yes Mode of Transportation: Other  (comment) (Truck) Occupation: Retired Leisure and Hobbies: Wood-working Prior Function Level of Independence: Independent with basic ADLs, Independent with homemaking with ambulation, Independent with transfers (Pt used walker ~3 weeks prior to surgery.)  Able to Take Stairs?: Yes Driving: Yes 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 Perception  Perception: Within Functional Limits Praxis Praxis: Intact Cognition Cognition Overall Cognitive Status: Within Functional Limits for tasks assessed Arousal/Alertness: Awake/alert Orientation Level: Person;Situation;Place Memory: Impaired Attention: Selective Awareness: Appears intact Problem Solving: Appears intact Safety/Judgment: Appears intact Brief Interview for Mental Status (BIMS) Repetition of Three Words (First Attempt): 3 Temporal Orientation: Year: Correct Temporal Orientation: Month: Accurate within 5 days Temporal Orientation: Day: Correct Recall: "Sock": No, could not recall Recall: "Blue": Yes, no cue required Recall: "Bed": Yes, no cue required BIMS Summary Score: 13 Sensation Sensation Light Touch: Impaired Detail (Sensory loss in R-hand due to past stroke.) Light Touch Impaired Details: Impaired RUE;Impaired LLE;Impaired RLE Additional Comments: Patient with impaired sensation, B LE from L1 down to his toes Coordination Gross Motor Movements are Fluid and Coordinated: No Fine Motor Movements are Fluid and Coordinated: No Coordination and Movement Description: Global deconditioning/weakness with recent R  BKA Motor  Motor Motor: Other (comment) Motor - Skilled Clinical Observations: Global deconditioning and weakness with recent R BKA.  Trunk/Postural Assessment  Cervical Assessment Cervical Assessment: Exceptions to [facility] (Foward head.) Thoracic Assessment Thoracic Assessment: Exceptions to [facility] (Rounded shoulders.) Lumbar Assessment Lumbar Assessment: Exceptions to [facility] (Posterior pelvic tilt.) Postural Control Postural Control: Deficits on evaluation (Pt unable to stand on eval due to  increased fatigue/weakness.) Righting Reactions: Delayed and inadequate secondary to recent R BKA  Balance Balance Balance Assessed: Yes Static Sitting Balance Static Sitting - Balance Support: Bilateral upper extremity supported;Feet supported Static Sitting - Level of Assistance: 5: Stand by assistance Extremity/Trunk Assessment RUE Assessment RUE Assessment: Exceptions to [facility] RUE Strength RUE Overall Strength: Deficits LUE Assessment LUE Assessment: Exceptions to [facility] LUE Strength LUE Overall Strength: Deficits  Care Tool Care Tool Self Care Eating   Eating Assist Level: Independent    Oral Care    Oral Care Assist Level: Independent with assistive device Assistive Device Comment: Seated on WC  Bathing Bathing activity did not occur:  (Pt choosing to do simple spongebath due to increased fatigue.) Body parts bathed by patient: Front perineal area;Face Body parts bathed by helper: Buttocks   Assist Level: Minimal Assistance - Patient > 75%    Upper Body Dressing(including orthotics)   What is the patient wearing?: Pull over shirt   Assist Level: Set up assist    Lower Body Dressing (excluding footwear)   What is the patient wearing?: Underwear/pull up;Pants Assist for lower body dressing: Moderate Assistance - Patient 50 - 74%    Putting on/Taking off footwear             Care Tool Toileting Toileting activity Toileting Activity did not occur (Clothing management and hygiene  only): N/A (no void or bm)       Care Tool Bed Mobility Roll left and right activity   Roll left and right assist level: Supervision/Verbal cueing    Sit to lying activity   Sit to lying assist level: Supervision/Verbal cueing    Lying to sitting on side of bed activity   Lying to sitting on side of bed assist level: the ability to move from lying on the back to sitting on the side of the bed with no back support.: Supervision/Verbal cueing     Care Tool Transfers Sit to stand transfer   Sit to stand assist level: Moderate Assistance - Patient 50 - 74%    Chair/bed transfer   Chair/bed transfer assist level: Moderate Assistance - Patient 50 - 74%     Toilet transfer         Care Tool Cognition  Expression of Ideas and Wants Expression of Ideas and Wants: 4. Without difficulty (complex and basic) - expresses complex messages without difficulty and with speech that is clear and easy to understand  Understanding Verbal and Non-Verbal Content Understanding Verbal and Non-Verbal Content: 4. Understands (complex and basic) - clear comprehension without cues or repetitions   Memory/Recall Ability Memory/Recall Ability : Current season;Location of own room;Staff names and faces;That he or she is in a hospital/hospital unit   Refer to Care Plan for [place] 1 OT Short Term Goal 1 (Week 1): Pt will complete LB dressing with Min A + LRAD. OT Short Term Goal 2 (Week 1): Pt will complete toileting activities with Min A + LRAD. OT Short Term Goal 3 (Week 1): Pt will complete full-body shower with Mod A + LRAD.  Recommendations for other services: Therapeutic Recreation  Pet therapy and Stress management   Skilled Therapeutic Intervention Pt received seated in [facility] for skilled OT session with focus on ADL retraining and functional transfers. Pt agreeable to interventions, demonstrating overall pleasant mood. Pt with 4/10 pain at surgical site, stating "it's like  a burning sensation." OT offering intermediate rest breaks and positioning suggestions throughout session to  address pain/fatigue and maximize participation/safety in session.   Session began with discussion in regards to OT role, OT POC, and general orientation to rehab unit/schedule. Pt performs oral care and face washing seated on WC at sink-level with overall item-retrieval/supervision.  Pt then attempting to complete STS in preparation for stand-pivot transfer, unable to reach full stand despite Max A+ RW due to fatigue/generalized weakness from previous session. Pt completes squat-pivot back to bed with Mod A + multimodal cuing for technique. Pt performs sit EOB>supine and general bed mobility with supervision/cuing.   At bed-level pt initiates doffing of pants/underwear, requiring Min A for fabric management due to decreased sensation in R-hand. Pt completes anterior peri-care with set-up, needing total A for posterior-area due to fatigue. Pt bridges with LLE to don underwear/pants over bottom/hips with A to differentiate between the two garments when initiating donning.   Pt remained resting in bed with all immediate needs met and bed alarm activated at end of session. Pt continues to be appropriate for skilled OT intervention to promote further functional independence.   ADL ADL Eating: Unable to assess Grooming: Supervision/safety Where Assessed-Grooming: Sitting at sink Upper Body Bathing: Unable to assess Lower Body Bathing: Moderate assistance (Pt chosing to only clean peri-area due to increased fatigue. Requiring A for cleaning posterior area.) Where Assessed-Lower Body Bathing: Bed level Upper Body Dressing: Unable to assess Lower Body Dressing: Minimal assistance (Pt requring Min A for fabric management.) Where Assessed-Lower Body Dressing: Bed level Toileting: Unable to assess Toilet Transfer: Unable to assess Toilet Transfer Method: Unable to assess Social research officer, government:  Unable to assess Social research officer, government Method: Unable to assess Mobility  Bed Mobility Bed Mobility: Rolling Right;Rolling Left;Supine to Sit;Scooting to [facility];Sit to Supine Rolling Right: Supervision/verbal cueing Rolling Left: Supervision/Verbal cueing Supine to Sit: Supervision/Verbal cueing Sit to Supine: Supervision/Verbal cueing Scooting to HOB: Minimal Assistance - Patient > 75% (Pt able to bridge with LLE to assist with boosting.) Transfers Sit to Stand: Moderate Assistance - Patient 50-74% Stand to Sit: Moderate Assistance - Patient 50-74%   Discharge Criteria: Patient will be discharged from OT if patient refuses treatment 3 consecutive times without medical reason, if treatment goals not met, if there is a change in medical status, if patient makes no progress towards goals or if patient is discharged from hospital.  The above assessment, treatment plan, treatment alternatives and goals were discussed and mutually agreed upon: by patient  Maudie Mercury, OTR/L, MSOT  03/22/2022, 12:22 PM

## 2022-03-22 NOTE — Progress Notes (Signed)
Inpatient Rehabilitation Care Coordinator Assessment and Plan Patient Details  Name: David Joseph MRN: [id number] Date of Birth: 06-13-1940  Today's Date: 03/22/2022  Hospital Problems: Principal Problem:   Above knee amputation of right lower extremity ([place]) Active Problems:   Acute blood loss anemia (ABLA)   Olecranon bursitis of left elbow   Chronic systolic congestive heart failure ([place])   Anemia  Past Medical History:  Past Medical History:  Diagnosis Date   Anemia    Arthritis    OA   Calcium pyrophosphate deposition disease (CPPD)    Diverticulitis    10 years ago   DVT (deep venous thrombosis) ([place]) 04/09/2015   left leg- recently   Heart failure ([place])    Pneumonia 13 years ago   Presence of internal carotid stent    Prostate CA ([place])    Rheumatoid arthritis ([place])    Past Surgical History:  Past Surgical History:  Procedure Laterality Date   BACK SURGERY  03/18/1978   lower    basal cell areas removed  Sept 2013, 2012   left lower lip and chest   CERVICAL FUSION     COLONOSCOPY  10/17/2011   EYE SURGERY  2 years ago   both cataract   JOINT REPLACEMENT  March 2012, 02/2012   right knee, redo right knee   left middle finger surgery  05/17/2010   PROSTATECTOMY  12/16/2013   at [place]  60 years ago   TOTAL KNEE ARTHROPLASTY Left 10/09/2015   Procedure: LEFT TOTAL KNEE ARTHROPLASTY;  Surgeon: Gaynelle Arabian, MD;  Location: [facility];  Service: Orthopedics;  Laterality: Left;   TOTAL KNEE REVISION  02/19/2012   Procedure: TOTAL KNEE REVISION;  Surgeon: [name], MD;  Location: [facility];  Service: Orthopedics;  Laterality: Right;   Social History:  reports that he quit smoking about 10 years ago. His smoking use included cigars. He has been exposed to tobacco smoke. He has never used smokeless tobacco. He reports current alcohol use of about 7.0 standard drinks of alcohol per week. He reports that he does not use drugs.  Family / Support  Systems Marital Status: Married Patient Roles: Spouse, Parent Spouse/Significant Other: Arbie Cookey [phone] Children: Son in [place] who plans on coming when pt goes home Other Supports: Friends and church members Anticipated Caregiver: Wife Ability/Limitations of Caregiver: Wife is active but does have shoulder issues Caregiver Availability: 24/7 Family Dynamics: Close knit family and friends pt feels he has good supports and will have what he needs at discharge  Social History Preferred language: English Religion: Baptist Cultural Background: No issues Education: Some Medical sales representative - How often do you need to have someone help you when you read instructions, pamphlets, or other written material from your doctor or pharmacy?: Never Writes: Yes Employment Status: Retired Public relations account executive Issues: No issues Guardian/Conservator: None-according to MD pt is capable of making his own decisions while here   Abuse/Neglect Abuse/Neglect Assessment Can Be Completed: Yes Physical Abuse: Denies Verbal Abuse: Denies Sexual Abuse: Denies Exploitation of patient/patient's resources: Denies Self-Neglect: Denies  Patient response to: Social Isolation - How often do you feel lonely or isolated from those around you?: Never  Emotional Status Pt's affect, behavior and adjustment status: Pt is motivated to do well and regain his independence while here. He was using a rolling walker prior to admission and was still having issues with his leg, this was drought out the loss of his leg, needed to get  heart in rhythm first. He does not want to burden his wife and wants to be as independent as he ca be at DC Recent Psychosocial Issues: other health issues Psychiatric History: No history seems to be coping appropriately with his amputation and able to talk about his feelings and issues he is having. Glad to be here on rehab Substance Abuse History: No issues  Patient / Family  Perceptions, Expectations & Goals Pt/Family understanding of illness & functional limitations: Pt and wife can explain his amputation and issues surrounding it. Both do talk with the MD's involved and feel have a good understanding of his treatment plan moving forward. Premorbid pt/family roles/activities: Husband, father, retiree, church member, friend, etc Anticipated changes in roles/activities/participation: resume Pt/family expectations/goals: Pt states: " I hope to do well here I am so glad to be here and be getting up. I did not get up from the bed for 30 days when I was at [facility]."  [employer]: None Premorbid Home Care/DME Agencies: Other (Comment) (rw) Transportation available at discharge: wife Is the patient able to respond to transportation needs?: Yes In the past 12 months, has lack of transportation kept you from medical appointments or from getting medications?: No In the past 12 months, has lack of transportation kept you from meetings, work, or from getting things needed for daily living?: No  Discharge Planning Living Arrangements: Spouse/significant other Support Systems: Spouse/significant other, Children, Church/faith community Type of Residence: Private residence Insurance Resources: [employer], Multimedia programmer (specify) ([place]) Museum/gallery curator Resources: Social Security, Family Support Financial Screen Referred: No Living Expenses: Own Money Management: Patient, Spouse Does the patient have any problems obtaining your medications?: No Home Management: wife Patient/Family Preliminary Plans: Return home with wife who is able to assist but does have shoulder issues. Their son plans to come from [place] to assist when he is discharged. Will work on discharge needs aware being evaluated today in therapies Care Coordinator Anticipated Follow Up Needs: HH/OP  Clinical Impression Pleasant gentleman who is motivated to do well and regain as  much independence as he can before going home. His wife is involved and will assist. Aware being evaluated today will work on discharge needs.  Elease Hashimoto 03/22/2022, 10:29 AM

## 2022-03-22 NOTE — Evaluation (Signed)
Physical Therapy Assessment and Plan  Patient Details  Name: David Joseph MRN: [id number] Date of Birth: 04-08-1940  PT Diagnosis: Abnormality of gait, Difficulty walking, Impaired sensation, Muscle weakness, and Pain in R residual limb from new BKA. Rehab Potential: Good ELOS: 12-14 days   Today's Date: 03/22/2022 PT Individual Time: 1st Treatment Session: 0900-1000; 2nd Treatment Session: 1415-1530 PT Individual Time Calculation (min): 60 min; 75 min  Hospital Problem: Principal Problem:   Unilateral complete BKA, right, subsequent encounter ([place]) Active Problems:   Acute blood loss anemia (ABLA)   Olecranon bursitis of left elbow   Chronic systolic congestive heart failure (HCC)   Anemia   S/P unilateral BKA (below knee amputation), right ([place])   Past Medical History:  Past Medical History:  Diagnosis Date   Anemia    Arthritis    OA   Calcium pyrophosphate deposition disease (CPPD)    Diverticulitis    10 years ago   DVT (deep venous thrombosis) ([place]) 04/09/2015   left leg- recently   Heart failure ([place])    Pneumonia 13 years ago   Presence of internal carotid stent    Prostate CA ([place])    Rheumatoid arthritis ([place])    Past Surgical History:  Past Surgical History:  Procedure Laterality Date   BACK SURGERY  03/18/1978   lower    basal cell areas removed  Sept 2013, 2012   left lower lip and chest   CERVICAL FUSION     COLONOSCOPY  10/17/2011   EYE SURGERY  2 years ago   both cataract   JOINT REPLACEMENT  March 2012, 02/2012   right knee, redo right knee   left middle finger surgery  05/17/2010   PROSTATECTOMY  12/16/2013   at [place]  60 years ago   TOTAL KNEE ARTHROPLASTY Left 10/09/2015   Procedure: LEFT TOTAL KNEE ARTHROPLASTY;  Surgeon: Gaynelle Arabian, MD;  [facility];  Service: Orthopedics;  Laterality: Left;   TOTAL KNEE REVISION  02/19/2012   Procedure: TOTAL KNEE REVISION;  Surgeon: [name], MD;  [facility];   Service: Orthopedics;  Laterality: Right;    Assessment & Plan Clinical Impression: Patient is a 82 year old male with history of T2DM, CVA, HFrEF- 40-45%,  CPPD, myelodysplastic syndrome (IgA lambda MGIUS), RA, CAF, [place], CPPD/pseudogout,  PAD s/p multiple bypass with recent left great toe amputation, multiple chronic wound RLE with gangrenous changes and critical limb ischemia. He was admitted to [facility] for R-BKA on 03/14/22 by Dr. Allayne Stack. Post op had issues with fever treated with IS as well as neuropathic pain. Lyrica added to help mange neuropathic pain left foot and Eliquis resumed. Luspatercept injection to be held per Hem/Onc and and Plaquenil/prednisone resumed.  IV narcotics weaned off and H/H noted to be on downward trend to 6.7 on 01/03. He was transfused with one PRBC with improvement in Hgb to 8.8 today. Glipzide has been held during hospitalization. Pain control has improved and PT/OT has been working with patient. He continues to be limited by weakness   Patient currently requires max with mobility secondary to muscle weakness, decreased cardiorespiratoy endurance, impaired sensation, and decreased standing balance and decreased balance strategies.  Prior to hospitalization, patient was independent  with mobility and lived with Spouse in a House home.  Home access is 5Stairs to enter (Patient reporting installing ramp vs lift for entrance into home).  Patient will benefit from skilled PT intervention to maximize safe functional  mobility, minimize fall risk, and decrease caregiver burden for planned discharge home with intermittent assist.  Anticipate patient will benefit from follow up [facility] at discharge.  PT - End of Session Activity Tolerance: Tolerates 30+ min activity with multiple rests Endurance Deficit: Yes Endurance Deficit Description: Impaired endurance/activity tolerance PT Assessment Rehab Potential (ACUTE/IP ONLY): Good PT Barriers to Discharge: Insurance for SNF  coverage;Decreased caregiver support;Inaccessible home environment;Weight bearing restrictions;Wound Care PT Patient demonstrates impairments in the following area(s): Balance;Safety;Sensory;Endurance;Skin Integrity;Nutrition;Pain PT Transfers Functional Problem(s): Bed Mobility;Bed to Chair;Car PT Locomotion Functional Problem(s): Ambulation;Wheelchair Mobility;Stairs PT Plan PT Intensity: Minimum of 1-2 x/day ,45 to 90 minutes PT Frequency: 5 out of 7 days PT Duration Estimated Length of Stay: 12-14 days PT Treatment/Interventions: Ambulation/gait training;Community reintegration;DME/adaptive equipment instruction;Neuromuscular re-education;Psychosocial support;Stair training;UE/LE Strength taining/ROM;Wheelchair propulsion/positioning;Balance/vestibular training;Discharge planning;Pain management;Skin care/wound management;Therapeutic Activities;UE/LE Coordination activities;Cognitive remediation/compensation;Disease management/prevention;Functional mobility training;Patient/family education;Splinting/orthotics;Therapeutic Exercise;Visual/perceptual remediation/compensation PT Transfers Anticipated Outcome(s): ModI with RW PT Locomotion Anticipated Outcome(s): Supv with RW for short distances <50' PT Recommendation Recommendations for Other Services: Neuropsych consult Follow Up Recommendations: Home health PT Patient destination: Home Equipment Recommended: To be determined Equipment Details: Patient owns a RW, rollator   PT Evaluation Precautions/Restrictions Precautions Precautions: Fall Precaution Comments: NWB R LE Restrictions Weight Bearing Restrictions: Yes RLE Weight Bearing: Non weight bearing Other Position/Activity Restrictions: R BKA with limb guard and L off-loading shoe with toe protection.   Pain Interference Pain Interference Pain Effect on Sleep: 1. Rarely or not at all Pain Interference with Therapy Activities: 0. Does not apply - I have not received  rehabilitationtherapy in the past 5 days Pain Interference with Day-to-Day Activities: 1. Rarely or not at all Home Living/Prior [place] Available Help at Discharge: Family;Available PRN/intermittently (Wife unable to assist physically) Type of Home: House Home Access: Stairs to enter (Patient reporting installing ramp vs lift for entrance into home) Entrance Stairs-Number of Steps: 5 Entrance Stairs-Rails: Left Home Layout: One level (Sunken living room, however already ordered a ramp) Bathroom Shower/Tub: Walk-in shower (Grab bars, rainhead shower with removable head, shower seat) Bathroom Toilet: Handicapped height Bathroom Accessibility: Yes  Lives With: Spouse Prior Function Level of Independence: Independent with basic ADLs;Independent with transfers;Independent with gait;Independent with homemaking with ambulation  Able to Take Stairs?: Yes Driving: Yes 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[employer], but his shop is in the basement and he reports he has not been down there in a while Leisure: Hobbies-yes (Comment) (turning wood) Vision/Perception  Vision - History Ability to See in Adequate Light: 1 Impaired Vision - Assessment Additional Comments: Patient had skin cancer on R-side of head, scarring/nerve damage from surgery causes ptosis of R-eyelid Perception Perception: Within Functional Limits Praxis Praxis: Intact  Cognition Overall Cognitive Status: Within Functional Limits for tasks assessed Arousal/Alertness: Awake/alert Orientation Level: Oriented X4 Year: 2024 Month: January Day of Week: Incorrect Memory: Impaired Awareness: Appears intact Problem Solving: Appears intact Safety/Judgment: Appears intact Sensation Sensation Light Touch: Impaired Detail Peripheral sensation comments: Sensory loss in R hand secondary to previous stroke; B LE peripheral neuropathy with impaired senstion from L1 down to his toes Light Touch Impaired  Details: Impaired RUE;Impaired LLE;Impaired RLE Coordination Gross Motor Movements are Fluid and Coordinated: No Fine Motor Movements are Fluid and Coordinated: No Coordination and Movement Description: Global deconditioning/weakness with recent R BKA Motor  Motor Motor: Other (comment) Motor - Skilled Clinical Observations: Global deconditioning and weakness with recent R BKA.   Trunk/Postural Assessment  Cervical Assessment Cervical Assessment: Exceptions to [facility] (Forward head) Thoracic Assessment  Thoracic Assessment: Exceptions to [facility] (Rounded shoulders) Lumbar Assessment Lumbar Assessment: Exceptions to [facility] (Posterior pelvic tilt) Postural Control Postural Control: Deficits on evaluation Righting Reactions: Delayed and inadequate secondary to recent R BKA  Balance Balance Balance Assessed: Yes Static Sitting Balance Static Sitting - Balance Support: Bilateral upper extremity supported;Feet supported Static Sitting - Level of Assistance: 5: Stand by assistance Dynamic Sitting Balance Dynamic Sitting - Balance Support: During functional activity Dynamic Sitting - Level of Assistance: 5: Stand by assistance Static Standing Balance Static Standing - Balance Support: Bilateral upper extremity supported;During functional activity Static Standing - Level of Assistance: 3: Mod assist Dynamic Standing Balance Dynamic Standing - Balance Support: During functional activity;Bilateral upper extremity supported Dynamic Standing - Level of Assistance: 3: Mod assist Extremity Assessment      RLE Assessment RLE Assessment: Exceptions to [facility] General Strength Comments: Limited secondary to new R BKA LLE Assessment LLE Assessment: Exceptions to [facility] General Strength Comments: Limited by pain in L foot- Grossly 3+/5  Care Tool Care Tool Bed Mobility Roll left and right activity   Roll left and right assist level: Supervision/Verbal cueing    Sit to lying activity   Sit to lying assist  level: Supervision/Verbal cueing    Lying to sitting on side of bed activity   Lying to sitting on side of bed assist level: the ability to move from lying on the back to sitting on the side of the bed with no back support.: Supervision/Verbal cueing     Care Tool Transfers Sit to stand transfer   Sit to stand assist level: Moderate Assistance - Patient 50 - 74%    Chair/bed transfer   Chair/bed transfer assist level: Moderate Assistance - Patient 50 - 74%     Toilet transfer Toilet transfer activity did not occur: N/A Assist Level: Moderate Assistance - Patient 50 - 74%    Car transfer   Car transfer assist level: Moderate Assistance - Patient 50 - 74%      Care Tool Locomotion Ambulation Ambulation activity did not occur: Safety/medical concerns (Unable to assess gait or stair mobility at this time secondary to recent R BKA with significant weakness and poor endurance/standing tolerance)        Walk 10 feet activity Walk 10 feet activity did not occur: Safety/medical concerns       Walk 50 feet with 2 turns activity Walk 50 feet with 2 turns activity did not occur: Safety/medical concerns      Walk 150 feet activity Walk 150 feet activity did not occur: Safety/medical concerns      Walk 10 feet on uneven surfaces activity Walk 10 feet on uneven surfaces activity did not occur: Safety/medical concerns      Stairs Stair activity did not occur: Safety/medical concerns        Walk up/down 1 step activity Walk up/down 1 step or curb (drop down) activity did not occur: Safety/medical concerns      Walk up/down 4 steps activity Walk up/down 4 steps activity did not occur: Safety/medical concerns      Walk up/down 12 steps activity Walk up/down 12 steps activity did not occur: Safety/medical concerns      Pick up small objects from floor Pick up small object from the floor (from standing position) activity did not occur: Safety/medical concerns      Wheelchair Is the  patient using a wheelchair?: Yes Type of Wheelchair: Manual   Wheelchair assist level: Supervision/Verbal cueing Max wheelchair distance: 150'  Wheel  50 feet with 2 turns activity   Assist Level: Supervision/Verbal cueing  Wheel 150 feet activity   Assist Level: Supervision/Verbal cueing    Refer to Care Plan for Long Term Goals  SHORT TERM GOAL WEEK 1 PT Short Term Goal 1 (Week 1): Patient will perform sit/stand with RW and MinA PT Short Term Goal 2 (Week 1): Patient will perform bed/chair transfers with RW and MinA PT Short Term Goal 3 (Week 1): Patient will initiate gait training of distances <50' with RW  Recommendations for other services: Neuropsych  Skilled Therapeutic Intervention Mobility Bed Mobility Bed Mobility: Rolling Right;Rolling Left;Supine to Sit;Scooting to [facility];Sit to Supine Rolling Right: Supervision/verbal cueing Rolling Left: Supervision/Verbal cueing Supine to Sit: Supervision/Verbal cueing Sit to Supine: Supervision/Verbal cueing Transfers Transfers: Sit to Stand;Stand to Sit;Stand Pivot Transfers Sit to Stand: Moderate Assistance - Patient 50-74% Stand to Sit: Moderate Assistance - Patient 50-74% Stand Pivot Transfers: Moderate Assistance - Patient 50 - 74% Stand Pivot Transfer Details: Verbal cues for sequencing;Verbal cues for safe use of DME/AE;Verbal cues for technique;Verbal cues for precautions/safety Transfer (Assistive device): Rolling walker Locomotion  Gait Ambulation: No Gait Gait: No Stairs / Additional Locomotion Stairs: No Wheelchair Mobility Wheelchair Mobility: Yes Wheelchair Assistance: Chartered loss adjuster: Both upper extremities Wheelchair Parts Management: Needs assistance Distance: >150'  Skilled Intervention:  1st Treatment Session- Patient greeted supine in bed with RN present and agreeable to PT treatment session. Patient performed all bed mobility without use of bed rail with Supv. Patient  provided with Limbguard and Limbsleeve from [facility] for R residual limb, as well as a surgical shoe for L foot secondary to wounds and impaired healing and sensation. Therapist retrieved properly fitting wheelchair and proper foot rest/amputee pad along with roho cushion secondary to impaired sensation for improved comfort and body mechanics when sitting upright. Patient performed sit/stand with RW and heavy ModA for initiating standing- VC for improved L knee extension throughout stand. While standing, therapist donned new brief, boxer and pants. Patient performed stand pivot transfer from EOB to wheelchair with heavy ModA and therapist required to assist with guiding his hips into the wheelchair due to decreased strength and standing tolerance. Patient left sitting upright in wheelchair with call bell within reach, all needs met and CSW present.   2nd Treatment Session- Patient greeted supine in bed with NT present and agreeable to PT treatment session. Patient transitioned from supine to sitting EOB with supv. Patient then performed sit/stand with RW and heavy ModA- VC for keeping L knee straight throughout standing for improved safety/stability. Patient pivoted to wheelchair with [place] and eventually required guiding of hips into the wheelchair secondary to weakness and poor standing tolerance. Patient propelled manual wheelchair >150' with B UE and supv for safety- Patient required increased time to complete secondary to fatigue and global weakness. Patient transferred to/from wheelchair and mat table via slideboard and SBA for safety- Patient provided with VC and demonstration for proper head/hips relationship and hand placement. Patient transitioned to/from sitting edge of mat table and supine with supervision. While supine, patient performed R SLR and R hip abduction, 3 x 15 each with education to perform while in the bed and outside of therapy. Patient transported back to his room via wheelchair. Patient left  sitting upright in wheelchair with tray table in front and call bell within reach- NT notified of patient positioning and how to safely assist patient back to bed.    Discharge Criteria: Patient will be discharged from PT if  patient refuses treatment 3 consecutive times without medical reason, if treatment goals not met, if there is a change in medical status, if patient makes no progress towards goals or if patient is discharged from hospital.  The above assessment, treatment plan, treatment alternatives and goals were discussed and mutually agreed upon: by patient  [place] 03/22/2022, 4:10 PM

## 2022-03-22 NOTE — Plan of Care (Signed)
  Problem: Sit to Stand Goal: LTG:  Patient will perform sit to stand with assistance level (PT) Description: LTG:  Patient will perform sit to stand with assistance level (PT) Flowsheets (Taken 03/22/2022 1626) LTG: PT will perform sit to stand in preparation for functional mobility with assistance level: Independent with assistive device   Problem: RH Bed Mobility Goal: LTG Patient will perform bed mobility with assist (PT) Description: LTG: Patient will perform bed mobility with assistance, with/without cues (PT). Flowsheets (Taken 03/22/2022 1626) LTG: Pt will perform bed mobility with assistance level of: Independent with assistive device    Problem: RH Bed to Chair Transfers Goal: LTG Patient will perform bed/chair transfers w/assist (PT) Description: LTG: Patient will perform bed to chair transfers with assistance (PT). Flowsheets (Taken 03/22/2022 1626) LTG: Pt will perform Bed to Chair Transfers with assistance level: Independent with assistive device    Problem: RH Car Transfers Goal: LTG Patient will perform car transfers with assist (PT) Description: LTG: Patient will perform car transfers with assistance (PT). Flowsheets (Taken 03/22/2022 1626) LTG: Pt will perform car transfers with assist:: Supervision/Verbal cueing   Problem: RH Ambulation Goal: LTG Patient will ambulate in home environment (PT) Description: LTG: Patient will ambulate in home environment, # of feet with assistance (PT). Flowsheets (Taken 03/22/2022 1626) LTG: Pt will ambulate in home environ  assist needed:: Contact Guard/Touching assist LTG: Ambulation distance in home environment: 50'   Problem: RH Wheelchair Mobility Goal: LTG Patient will propel w/c in community environment (PT) Description: LTG: Patient will propel wheelchair in community environment, # of feet with assist (PT) Flowsheets (Taken 03/22/2022 1626) LTG: Pt will propel w/c in community environ  assist needed:: Independent with assistive  device Distance: wheelchair distance in controlled environment: 150

## 2022-03-22 NOTE — Plan of Care (Signed)
  Problem: Sit to Stand Goal: LTG:  Patient will perform sit to stand in prep for activites of daily living with assistance level (OT) Description: LTG:  Patient will perform sit to stand in prep for activites of daily living with assistance level (OT) Flowsheets (Taken 03/22/2022 1212) LTG: PT will perform sit to stand in prep for activites of daily living with assistance level: Independent with assistive device   Problem: RH Bathing Goal: LTG Patient will bathe all body parts with assist levels (OT) Description: LTG: Patient will bathe all body parts with assist levels (OT) Flowsheets (Taken 03/22/2022 1212) LTG: Pt will perform bathing with assistance level/cueing: Set up assist    Problem: RH Dressing Goal: LTG Patient will perform lower body dressing w/assist (OT) Description: LTG: Patient will perform lower body dressing with assist, with/without cues in positioning using equipment (OT) Flowsheets (Taken 03/22/2022 1215) LTG: Pt will perform lower body dressing with assistance level of: Independent with assistive device   Problem: RH Toileting Goal: LTG Patient will perform toileting task (3/3 steps) with assistance level (OT) Description: LTG: Patient will perform toileting task (3/3 steps) with assistance level (OT)  Flowsheets (Taken 03/22/2022 1215) LTG: Pt will perform toileting task (3/3 steps) with assistance level: Independent with assistive device   Problem: RH Simple Meal Prep Goal: LTG Patient will perform simple meal prep w/assist (OT) Description: LTG: Patient will perform simple meal prep with assistance, with/without cues (OT). Flowsheets (Taken 03/22/2022 1215) LTG: Pt will perform simple meal prep with assistance level of: Independent with assistive device LTG: Pt will perform simple meal prep w/level of: Wheelchair level   Problem: RH Toilet Transfers Goal: LTG Patient will perform toilet transfers w/assist (OT) Description: LTG: Patient will perform toilet transfers  with assist, with/without cues using equipment (OT) Flowsheets (Taken 03/22/2022 1215) LTG: Pt will perform toilet transfers with assistance level of: Independent with assistive device   Problem: RH Tub/Shower Transfers Goal: LTG Patient will perform tub/shower transfers w/assist (OT) Description: LTG: Patient will perform tub/shower transfers with assist, with/without cues using equipment (OT) Flowsheets (Taken 03/22/2022 1215) LTG: Pt will perform tub/shower stall transfers with assistance level of: Independent with assistive device

## 2022-03-23 DIAGNOSIS — S88111D Complete traumatic amputation at level between knee and ankle, right lower leg, subsequent encounter: Secondary | ICD-10-CM | POA: Diagnosis not present

## 2022-03-23 LAB — BASIC METABOLIC PANEL
Anion gap: 10 (ref 5–15)
BUN: 12 mg/dL (ref 8–23)
CO2: 24 mmol/L (ref 22–32)
Calcium: 8.2 mg/dL — ABNORMAL LOW (ref 8.9–10.3)
Chloride: 96 mmol/L — ABNORMAL LOW (ref 98–111)
Creatinine, Ser: 0.78 mg/dL (ref 0.61–1.24)
GFR, Estimated: >60 mL/min (ref >60)
Glucose, Bld: 194 mg/dL — ABNORMAL HIGH (ref 70–99)
Potassium: 3.8 mmol/L (ref 3.5–5.1)
Sodium: 130 mmol/L — ABNORMAL LOW (ref 135–145)

## 2022-03-23 LAB — GLUCOSE, CAPILLARY
Glucose-Capillary: 112 mg/dL — ABNORMAL HIGH (ref 70–99)
Glucose-Capillary: 178 mg/dL — ABNORMAL HIGH (ref 70–99)
Glucose-Capillary: 207 mg/dL — ABNORMAL HIGH (ref 70–99)
Glucose-Capillary: 166 mg/dL — ABNORMAL HIGH (ref 70–99)

## 2022-03-23 NOTE — Progress Notes (Signed)
Occupational Therapy Session Note  Patient Details  Name: David Joseph MRN: [id number] Date of Birth: 08-17-1940  Today's Date: 03/23/2022 OT Individual Time: 3762-8315 OT Individual Time Calculation (min): 72 min    Short Term Goals: Week 1:  OT Short Term Goal 1 (Week 1): Pt will complete LB dressing with Min A + LRAD. OT Short Term Goal 2 (Week 1): Pt will complete toileting activities with Min A + LRAD. OT Short Term Goal 3 (Week 1): Pt will complete full-body shower with Mod A + LRAD.  Skilled Therapeutic Interventions/Progress Updates:     Pt received in w/c with no pain in residual limb. P tagreeabble to shower with BLE covered with occlusives to keep dry. ADL: Pt completes ADL at overall min-mod A sit to stand Level. Skilled interventions include: cuing for different methods of transfering onto TTB, switching out shower chair for TTB for more surface area for lateral leans, edu re lateral leans forwashing buttocks. MIN A for bathing, MOD A for uphill transfer out of shower with grab bar, and MOD A for sit to stnads at sink for A for clothing management. Pt uses reacher with cuing to thread RLE into pants and underwear. S to don shirt and groom.  Therapeutic exercise Pt completes UB therex with sanderbox on tabletop for closed chain exercise with 7# weights in seated position in the following pattern 2x15-20 to improve BUE endurance and strength required for BADLs  Forwards/backwards V's Rainbows Circles in B directions  Pt left at end of session in bed with exit alarm on, call light in reach and all needs met   Therapy Documentation Precautions:   General:   Vital Signs: Therapy Vitals Temp: 98.3 F (36.8 C) Temp Source: Oral Pulse Rate: 70 Resp: 18 BP: (!) 122/56 Patient Position (if appropriate): Lying Oxygen Therapy SpO2: 99 % O2 Device: Room Air Pain:   Therapy/Group: Individual Therapy  Tonny Branch 03/23/2022, 6:53 AM

## 2022-03-23 NOTE — Progress Notes (Signed)
PROGRESS NOTE   Subjective/Complaints: No new complaints this morning Getting dressed with therapy OT has no concerns Denies residual limb pain  ROS: Hearing loss-chronic, neck pain, R face numbness, no CP or SOB, No N/V, denies residual limb pain  Objective:   No results found. Recent Labs    03/22/22 0541  WBC 4.6  HGB 8.9*  HCT 25.4*  PLT 155   Recent Labs    03/22/22 0541 03/23/22 1149  NA 130* 130*  K 3.8 3.8  CL 97* 96*  CO2 25 24  GLUCOSE 122* 194*  BUN 13 12  CREATININE 0.61 0.78  CALCIUM 7.6* 8.2*    Intake/Output Summary (Last 24 hours) at 03/23/2022 1503 Last data filed at 03/23/2022 0600 Gross per 24 hour  Intake 120 ml  Output 1175 ml  Net -1055 ml        Physical Exam: Vital Signs Blood pressure (!) 122/56, pulse 70, temperature 98.3 F (36.8 C), temperature source Oral, resp. rate 18, height '5\' 8"'  (1.727 m), weight 66.2 kg, SpO2 99 %.  Gen: no distress, normal appearing HEENT: oral mucosa pink and moist, NCAT Cardio: Reg rate Chest: normal effort, normal rate of breathing Abd: soft, non-distended Ext: no edema Psych: Pt's affect is appropriate. Pt is cooperative Skin: Clean and dry dressing R BKA   Neuro:   Mental Status: He is oriented to person, place, and time.     Comments: Decreased to light touch from mid thighs down B/L  Musculoskeletal:     Cervical back: Neck supple. No tenderness.     Comments: UE strength 4+/5 B/L- weaker than expected R HF and KE 4/5- limited by pain LLE_ HF 3-/5; KE 3+/5; DF 4/5; PF 4-/5   Has L olecranon bursitis- skin not open     Assessment/Plan: 1. Functional deficits which require 3+ hours per day of interdisciplinary therapy in a comprehensive inpatient rehab setting. Physiatrist is providing close team supervision and 24 hour management of active medical problems listed below. Physiatrist and rehab team continue to assess barriers to  discharge/monitor patient progress toward functional and medical goals  Care Tool:  Bathing  Bathing activity did not occur:  (Pt choosing to do simple spongebath due to increased fatigue.) Body parts bathed by patient: Front perineal area, Face   Body parts bathed by helper: Buttocks     Bathing assist Assist Level: Minimal Assistance - Patient > 75%     Upper Body Dressing/Undressing Upper body dressing   What is the patient wearing?: Pull over shirt    Upper body assist Assist Level: Set up assist    Lower Body Dressing/Undressing Lower body dressing      What is the patient wearing?: Underwear/pull up, Pants     Lower body assist Assist for lower body dressing: Moderate Assistance - Patient 50 - 74%     Toileting Toileting Toileting Activity did not occur (Clothing management and hygiene only): N/A (no void or bm)  Toileting assist       Transfers Chair/bed transfer  Transfers assist     Chair/bed transfer assist level: Moderate Assistance - Patient 50 - 74%     Locomotion Ambulation  Ambulation assist   Ambulation activity did not occur: Safety/medical concerns (Unable to assess gait or stair mobility at this time secondary to recent R BKA with significant weakness and poor endurance/standing tolerance)          Walk 10 feet activity   Assist  Walk 10 feet activity did not occur: Safety/medical concerns        Walk 50 feet activity   Assist Walk 50 feet with 2 turns activity did not occur: Safety/medical concerns         Walk 150 feet activity   Assist Walk 150 feet activity did not occur: Safety/medical concerns         Walk 10 feet on uneven surface  activity   Assist Walk 10 feet on uneven surfaces activity did not occur: Safety/medical concerns         Wheelchair     Assist Is the patient using a wheelchair?: Yes Type of Wheelchair: Manual    Wheelchair assist level: Supervision/Verbal cueing Max  wheelchair distance: 150'    Wheelchair 50 feet with 2 turns activity    Assist        Assist Level: Supervision/Verbal cueing   Wheelchair 150 feet activity     Assist      Assist Level: Supervision/Verbal cueing   Blood pressure (!) 122/56, pulse 70, temperature 98.3 F (36.8 C), temperature source Oral, resp. rate 18, height '5\' 8"'  (1.727 m), weight 66.2 kg, SpO2 99 %.   Medical Problem List and Plan: 1. 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  Antithrombotics: -DVT/anticoagulation:  Pharmaceutical: Eliquis             -antiplatelet therapy: N/A3  3. Pain Management:  oxycodone prn.              --Lyrica ITD-->75 mg, 50 mg 75 mg. Might benefit from increasing Lyrica due to nerve pain on B/L LE's AND phantom pain   Pt reports nerve pain and phantom pain under control, will continue current medications and monitor 4. Mood/Behavior/Sleep: LCSW to follow for evaluation and support.              -antipsychotic agents: N/A             --trazodone prn for insomnia.  5. Neuropsych/cognition: This patient is capable of making decisions on his own behalf. 6. Skin/Wound Care: Routine pressure relief measures.             --monitor incision for healing.             --protein supplement to promote wound healing.  7. Fluids/Electrolytes/Nutrition: Monitor I/O. Check CMET in am             --add protein supplement.  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              --will check A1C in am.              --continue to hold glipizide and resume if elevated.   -1/6 CBGs stable, continue to monitor trend  CBG (last 3)  Recent Labs  03/22/22 2212 03/23/22 0544 03/23/22 1151  GLUCAP 137* 112* 178*    9. Afib w/RVR:  Diagnosed 11/2021 s/p ablation 02/12/22 by Dr. Candis Musa.  --Monitor HR TID.               --Continue metoprolol and Eliquis. -in NSR  10. Persistent hyponatremia:Recheck Na level in am.   -1/5 Salt tabs started, recheck BMP tomorrow 11. IgA Lambda MGUS w/7% sideroblasts : Thrombocytopenia stable.   --was getting transfusion PTA due to illness.  --monitor for any signs of bleeding.  12. Acute on chronic anemia: Improved post transfusion w/one unit PRBC up to 8.8 from 6.7 yesterday.              -1/5 HGB stable at 8.9 13. Congestive HFrEF: Heart healthy diet. Continue Lipitor and metoprolol. Daily weight.  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  16. Loose stools- have stopped pt's scheduled bowel meds- isn't on ABX- might Benefit from fiber if things don't improve.  -1/5 reports improved, continue to monitor 17. Hypotensive: will maintain lopressor given afib, continue to monitor BP      LOS: 2 days A FACE TO FACE EVALUATION WAS PERFORMED  Martha Clan P Minha Fulco 03/23/2022, 3:03 PM

## 2022-03-23 NOTE — Progress Notes (Signed)
Physical Therapy Session Note  Patient Details  Name: David Joseph MRN: [id number] Date of Birth: 1940/07/28  Today's Date: 03/23/2022 PT Individual Time: [phone] and [phone] PT Individual Time Calculation (min): 75 min and 48 min  Short Term Goals: Week 1:  PT Short Term Goal 1 (Week 1): Patient will perform sit/stand with RW and MinA PT Short Term Goal 2 (Week 1): Patient will perform bed/chair transfers with RW and MinA PT Short Term Goal 3 (Week 1): Patient will initiate gait training of distances <50' with RW  Skilled Therapeutic Interventions/Progress Updates:   First session:  Pt presents supine in bed and agreeable to therapy.  Pt required assist to thread pants over feet, and then unilaterally bridges for min A to compete pulling up over hips.  Limb guard donned.  Pt transfers to Left side to simulate home bed set-up, sup to sit w/ supervision.  Post-op shoe donned.  Pt performed sit to stand w/ mod A and verbal cues for sequencing.  Pt wheeled to dayroom for energy conservation.  Pt performed squat pivot transfer to L, w/c > Nu-step w/ mod A and sequencing, especially forward lean.  Pt performed Nu-step Level 3 for 4' x 2 w/ seated rest break between 2/2 fatigue.  O2 sats at 100% and HR 80 bpm after activity.  Pt performed squat pivot w/ mod A to left.  Pt educated on removal of arm rest.  Pt performed 2 x 5 w/c push-ups, cueing for forward lean.  Pt c/o arthritis pain to B hands/wrists.  Pt returned to room and remained sitting in w/c w/ chair alarm on and all needs in reach.  Second session:  Pt presents sitting in w/c watching Bball game.  Pt performed sit to stand transfers x 5 w/ mod A and RW, standing 2-3' per trial, while watching basketball game.  Pt wheeled to dayroom and performed squat pivot w/c <> mat table.  Pt requires increased cueing for forward lean to improve clearance of bottom.  Pt performed 2 x 5 w/c push-ups.  Pt returned to room and remained sitting in w/c w/  chair alarm on and all needs in reach.     Therapy Documentation Precautions:   General:   Vital Signs:  Pain:4/10  Pain Assessment Pain Scale: 0-10 Pain Score: 4  Pain Type: Surgical pain Pain Orientation: Right Pain Intervention(s): Medication (See eMAR)     Therapy/Group: Individual Therapy  Ladoris Gene 03/23/2022, 9:16 AM

## 2022-03-24 DIAGNOSIS — S88111D Complete traumatic amputation at level between knee and ankle, right lower leg, subsequent encounter: Secondary | ICD-10-CM | POA: Diagnosis not present

## 2022-03-24 LAB — GLUCOSE, CAPILLARY
Glucose-Capillary: 107 mg/dL — ABNORMAL HIGH (ref 70–99)
Glucose-Capillary: 180 mg/dL — ABNORMAL HIGH (ref 70–99)
Glucose-Capillary: 199 mg/dL — ABNORMAL HIGH (ref 70–99)
Glucose-Capillary: 210 mg/dL — ABNORMAL HIGH (ref 70–99)

## 2022-03-24 MED ORDER — GLIPIZIDE ER 2.5 MG PO TB24
2.5000 mg | ORAL_TABLET | Freq: Every day | ORAL | Status: DC
  Administered 2022-03-25 – 2022-04-03 (×10): 2.5 mg via ORAL
  Filled 2022-03-24 (×10): qty 1

## 2022-03-24 MED ORDER — DOCUSATE SODIUM 100 MG PO CAPS
100.0000 mg | ORAL_CAPSULE | Freq: Every day | ORAL | Status: DC | PRN
  Administered 2022-03-24 – 2022-04-04 (×4): 100 mg via ORAL
  Filled 2022-03-24 (×4): qty 1

## 2022-03-24 MED ORDER — SORBITOL 70 % SOLN
30.0000 mL | Freq: Once | Status: AC
  Administered 2022-03-24: 30 mL via ORAL
  Filled 2022-03-24: qty 30

## 2022-03-24 NOTE — IPOC Note (Signed)
Overall Plan of Care [facility]) Patient Details Name: David Joseph MRN: [id number] DOB: 1940-10-09  Admitting Diagnosis: Unilateral complete BKA, right, subsequent encounter [facility])  Hospital Problems: Principal Problem:   Unilateral complete BKA, right, subsequent encounter ([place]) Active Problems:   Acute blood loss anemia (ABLA)   Olecranon bursitis of left elbow   Chronic systolic congestive heart failure (HCC)   Anemia   S/P unilateral BKA (below knee amputation), right (HCC)     Functional Problem List: Nursing Pain, Safety, Endurance, Medication Management, Skin Integrity  PT Balance, Safety, Sensory, Endurance, Skin Integrity, Nutrition, Pain  OT Balance, Endurance, Pain, Safety, Motor  SLP    TR         Basic ADL's: OT Bathing, Dressing, Toileting     Advanced  ADL's: OT Simple Meal Preparation     Transfers: PT Bed Mobility, Bed to Chair, Teacher, early years/pre, Tub/Shower     Locomotion: PT Ambulation, Emergency planning/management officer, Stairs     Additional Impairments: OT    SLP        TR      Anticipated Outcomes Item Anticipated Outcome  Self Feeding    Swallowing      Basic self-care  Mod I  Toileting  Mod I   Bathroom Transfers Mod I  Bowel/Bladder  N/A  Transfers  ModI with RW  Locomotion  Supv with RW for short distances <50'  Communication     Cognition     Pain  < 4 with prns  Safety/Judgment  manage w cues   Therapy Plan: PT Intensity: Minimum of 1-2 x/day ,45 to 90 minutes PT Frequency: 5 out of 7 days PT Duration Estimated Length of Stay: 12-14 days OT Intensity: Minimum of 1-2 x/day, 45 to 90 minutes OT Frequency: 5 out of 7 days OT Duration/Estimated Length of Stay: 12-14 days     Team Interventions: Nursing Interventions    PT interventions Ambulation/gait training, Community reintegration, Engineer, drilling, Neuromuscular re-education, Psychosocial support, Stair training, UE/LE Strength taining/ROM, Wheelchair  propulsion/positioning, Training and development officer, Discharge planning, Pain management, Skin care/wound management, Therapeutic Activities, UE/LE Coordination activities, Cognitive remediation/compensation, Disease management/prevention, Functional mobility training, Patient/family education, Splinting/orthotics, Therapeutic Exercise, Visual/perceptual remediation/compensation  OT Interventions Balance/vestibular training, Discharge planning, DME/adaptive equipment instruction, Functional mobility training, Pain management, Patient/family education, Psychosocial support, Self Care/advanced ADL retraining, Skin care/wound managment, Therapeutic Activities, Therapeutic Exercise, UE/LE Strength taining/ROM, UE/LE Coordination activities  SLP Interventions    TR Interventions    SW/CM Interventions Discharge Planning, Psychosocial Support, Patient/Family Education   Barriers to Discharge MD  Medical stability  Nursing Decreased caregiver support, Home environment access/layout, Weight bearing restrictions 1 level 5 ste left rail w spouse;wife getting modifications for inside home common area has one step up as well as wife checking into outside ramp vs chairleft for entry into home  PT Insurance for SNF coverage, Decreased caregiver support, Inaccessible home environment, Weight bearing restrictions, Wound Care    OT Wound Care    SLP      SW       Team Discharge Planning: Destination: PT-Home ,OT- Home , SLP-  Projected Follow-up: PT-Home health PT, OT-  Home health OT, SLP-  Projected Equipment Needs: PT-To be determined, OT- To be determined, SLP-  Equipment Details: PT-Patient owns a RW, rollator, OT-  Patient/family involved in discharge planning: PT- Patient,  OT-Patient, SLP-   MD ELOS: 7-10 days Medical Rehab Prognosis:  Excellent Assessment: The patient has been admitted for CIR therapies with the  diagnosis of R BKA. The team will be addressing functional mobility, strength,  stamina, balance, safety, adaptive techniques and equipment, self-care, bowel and bladder mgt, patient and caregiver education. Goals have been set at modI/S. Anticipated discharge destination is home.         See Team Conference Notes for weekly updates to the plan of care

## 2022-03-24 NOTE — Progress Notes (Signed)
PROGRESS NOTE   Subjective/Complaints: Asks that I see his residual limb as he says it hasn't been checked yet- appears bruised but with no drainage, asked nursing to apply shrinker.   ROS: Hearing loss-chronic, neck pain, R face numbness, denies CP or SOB, No N/V, denies residual limb pain  Objective:   No results found. Recent Labs    03/22/22 0541  WBC 4.6  HGB 8.9*  HCT 25.4*  PLT 155   Recent Labs    03/22/22 0541 03/23/22 1149  NA 130* 130*  K 3.8 3.8  CL 97* 96*  CO2 25 24  GLUCOSE 122* 194*  BUN 13 12  CREATININE 0.61 0.78  CALCIUM 7.6* 8.2*    Intake/Output Summary (Last 24 hours) at 03/24/2022 0852 Last data filed at 03/24/2022 0521 Gross per 24 hour  Intake 436 ml  Output 2225 ml  Net -1789 ml        Physical Exam: Vital Signs Blood pressure (!) 106/49, pulse 71, temperature 99.1 F (37.3 C), temperature source Oral, resp. rate 18, height '5\' 8"'  (1.727 m), weight 66.2 kg, SpO2 100 %.  Gen: no distress, normal appearing HEENT: oral mucosa pink and moist, NCAT Cardio: Reg rate Chest: normal effort, normal rate of breathing Abd: soft, non-distended Ext: no edema Psych: Pt's affect is appropriate. Pt is cooperative Skin: Clean and dry dressing R BKA    Neuro:   Mental Status: He is oriented to person, place, and time.     Comments: Decreased to light touch from mid thighs down B/L  Musculoskeletal:     Cervical back: Neck supple. No tenderness.     Comments: UE strength 4+/5 B/L- weaker than expected R HF and KE 4/5- limited by pain LLE_ HF 3-/5; KE 3+/5; DF 4/5; PF 4-/5   Has L olecranon bursitis- skin not open     Assessment/Plan: 1. Functional deficits which require 3+ hours per day of interdisciplinary therapy in a comprehensive inpatient rehab setting. Physiatrist is providing close team supervision and 24 hour management of active medical problems listed below. Physiatrist and  rehab team continue to assess barriers to discharge/monitor patient progress toward functional and medical goals  Care Tool:  Bathing  Bathing activity did not occur:  (Pt choosing to do simple spongebath due to increased fatigue.) Body parts bathed by patient: Front perineal area, Face   Body parts bathed by helper: Buttocks     Bathing assist Assist Level: Minimal Assistance - Patient > 75%     Upper Body Dressing/Undressing Upper body dressing   What is the patient wearing?: Pull over shirt    Upper body assist Assist Level: Set up assist    Lower Body Dressing/Undressing Lower body dressing      What is the patient wearing?: Underwear/pull up, Pants     Lower body assist Assist for lower body dressing: Moderate Assistance - Patient 50 - 74%     Toileting Toileting Toileting Activity did not occur (Clothing management and hygiene only): N/A (no void or bm)  Toileting assist       Transfers Chair/bed transfer  Transfers assist     Chair/bed transfer assist level: Moderate Assistance -  Patient 50 - 74%     Locomotion Ambulation   Ambulation assist   Ambulation activity did not occur: Safety/medical concerns (Unable to assess gait or stair mobility at this time secondary to recent R BKA with significant weakness and poor endurance/standing tolerance)          Walk 10 feet activity   Assist  Walk 10 feet activity did not occur: Safety/medical concerns        Walk 50 feet activity   Assist Walk 50 feet with 2 turns activity did not occur: Safety/medical concerns         Walk 150 feet activity   Assist Walk 150 feet activity did not occur: Safety/medical concerns         Walk 10 feet on uneven surface  activity   Assist Walk 10 feet on uneven surfaces activity did not occur: Safety/medical concerns         Wheelchair     Assist Is the patient using a wheelchair?: Yes Type of Wheelchair: Manual    Wheelchair assist  level: Supervision/Verbal cueing Max wheelchair distance: 150'    Wheelchair 50 feet with 2 turns activity    Assist        Assist Level: Supervision/Verbal cueing   Wheelchair 150 feet activity     Assist      Assist Level: Supervision/Verbal cueing   Blood pressure (!) 106/49, pulse 71, temperature 99.1 F (37.3 C), temperature source Oral, resp. rate 18, height '5\' 8"'  (1.727 m), weight 66.2 kg, SpO2 100 %.   Medical Problem List and Plan: 1. 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  Antithrombotics: -DVT/anticoagulation:  Pharmaceutical: Eliquis             -antiplatelet therapy: N/A3  3. Pain Management:  oxycodone prn.              --Lyrica ITD-->75 mg, 50 mg 75 mg. Might benefit from increasing Lyrica due to nerve pain on B/L LE's AND phantom pain   Pt reports nerve pain and phantom pain under control, will continue current medications and monitor 4. Insomnia: continue trazodone prn 5. Neuropsych/cognition: This patient is capable of making decisions on his own behalf. 6. Skin/Wound Care: Routine pressure relief measures.             --monitor incision for healing.             --protein supplement to promote wound healing.  7. Fluids/Electrolytes/Nutrition: Monitor I/O. Check CMET in am             --add protein supplement.  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              --will check A1C in am.              --continue to hold glipizide and resume if elevated.   -1/6 CBGs stable, continue to monitor trend  1/7: glipizide 2.31m resumed  CBG (last 3)  Recent Labs    03/23/22 1656 03/23/22 2105 03/24/22 0518  GLUCAP 207* 166* 107*  9. Afib w/RVR:  Diagnosed 11/2021 s/p ablation 02/12/22 by Dr. Candis Musa.  --Monitor HR TID.               --Continue metoprolol and Eliquis. -in NSR  10. Persistent hyponatremia:Recheck Na level in am.   -1/5 Salt tabs started, recheck BMP tomorrow  BMP stable on recheck, repeat Monday. 11. IgA Lambda MGUS w/7% sideroblasts : Thrombocytopenia stable.   --was getting transfusion PTA due to illness.  --monitor for any signs of bleeding.  12. Acute on chronic anemia: Improved post transfusion w/one unit PRBC up to 8.8 from 6.7 yesterday.              -1/5 HGB stable at 8.9 13. Congestive HFrEF: Heart healthy diet. Continue Lipitor and metoprolol. Daily weight.  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  16. Loose stools- have stopped pt's scheduled bowel meds- isn't on ABX- might Benefit from fiber if things don't improve.  -1/5 reports improved, continue to monitor 17. Hypotensive: will maintain lopressor given afib, continue to monitor BP      LOS: 3 days A FACE TO FACE EVALUATION WAS PERFORMED  David Joseph 03/24/2022, 8:52 AM

## 2022-03-25 DIAGNOSIS — D649 Anemia, unspecified: Secondary | ICD-10-CM | POA: Diagnosis not present

## 2022-03-25 DIAGNOSIS — E1142 Type 2 diabetes mellitus with diabetic polyneuropathy: Secondary | ICD-10-CM | POA: Diagnosis not present

## 2022-03-25 DIAGNOSIS — E876 Hypokalemia: Secondary | ICD-10-CM

## 2022-03-25 DIAGNOSIS — Z89511 Acquired absence of right leg below knee: Secondary | ICD-10-CM | POA: Diagnosis not present

## 2022-03-25 DIAGNOSIS — E871 Hypo-osmolality and hyponatremia: Secondary | ICD-10-CM | POA: Diagnosis not present

## 2022-03-25 LAB — BASIC METABOLIC PANEL
Anion gap: 8 (ref 5–15)
BUN: 12 mg/dL (ref 8–23)
CO2: 27 mmol/L (ref 22–32)
Calcium: 7.8 mg/dL — ABNORMAL LOW (ref 8.9–10.3)
Chloride: 99 mmol/L (ref 98–111)
Creatinine, Ser: 0.63 mg/dL (ref 0.61–1.24)
GFR, Estimated: >60 mL/min (ref >60)
Glucose, Bld: 123 mg/dL — ABNORMAL HIGH (ref 70–99)
Potassium: 3.2 mmol/L — ABNORMAL LOW (ref 3.5–5.1)
Sodium: 134 mmol/L — ABNORMAL LOW (ref 135–145)

## 2022-03-25 LAB — GLUCOSE, CAPILLARY
Glucose-Capillary: 138 mg/dL — ABNORMAL HIGH (ref 70–99)
Glucose-Capillary: 171 mg/dL — ABNORMAL HIGH (ref 70–99)
Glucose-Capillary: 238 mg/dL — ABNORMAL HIGH (ref 70–99)
Glucose-Capillary: 76 mg/dL (ref 70–99)

## 2022-03-25 LAB — CBC
HCT: 25.7 % — ABNORMAL LOW (ref 39.0–52.0)
Hemoglobin: 8.5 g/dL — ABNORMAL LOW (ref 13.0–17.0)
MCH: 34.6 pg — ABNORMAL HIGH (ref 26.0–34.0)
MCHC: 33.1 g/dL (ref 30.0–36.0)
MCV: 104.5 fL — ABNORMAL HIGH (ref 80.0–100.0)
Platelets: 176 10*3/uL (ref 150–400)
RBC: 2.46 MIL/uL — ABNORMAL LOW (ref 4.22–5.81)
RDW: 22.7 % — ABNORMAL HIGH (ref 11.5–15.5)
WBC: 4.1 10*3/uL (ref 4.0–10.5)
nRBC: 0 % (ref 0.0–0.2)

## 2022-03-25 MED ORDER — PREGABALIN 75 MG PO CAPS
75.0000 mg | ORAL_CAPSULE | Freq: Three times a day (TID) | ORAL | Status: DC
  Administered 2022-03-25 – 2022-03-29 (×10): 75 mg via ORAL
  Filled 2022-03-25 (×10): qty 1

## 2022-03-25 MED ORDER — POTASSIUM CHLORIDE CRYS ER 20 MEQ PO TBCR
40.0000 meq | EXTENDED_RELEASE_TABLET | Freq: Once | ORAL | Status: AC
  Administered 2022-03-25: 40 meq via ORAL
  Filled 2022-03-25: qty 2

## 2022-03-25 NOTE — Progress Notes (Signed)
PROGRESS NOTE   Subjective/Complaints: Reports having uncomfortable phantom pain. No additional concerns.   ROS: Hearing loss-chronic, neck pain, R face numbness, denies CP or SOB, No N/V, denies residual limb pain + phantom pain  Objective:   No results found. Recent Labs    03/25/22 0637  WBC 4.1  HGB 8.5*  HCT 25.7*  PLT 176    Recent Labs    03/23/22 1149 03/25/22 0637  NA 130* 134*  K 3.8 3.2*  CL 96* 99  CO2 24 27  GLUCOSE 194* 123*  BUN 12 12  CREATININE 0.78 0.63  CALCIUM 8.2* 7.8*     Intake/Output Summary (Last 24 hours) at 03/25/2022 0807 Last data filed at 03/24/2022 2200 Gross per 24 hour  Intake 236 ml  Output 925 ml  Net -689 ml         Physical Exam: Vital Signs Blood pressure (!) 104/52, pulse 67, temperature 98 F (36.7 C), resp. rate 18, height '5\' 8"'  (1.727 m), weight 62 kg, SpO2 100 %.  Gen: no distress, normal appearing HEENT: oral mucosa pink and moist, NCAT Cardio: Reg rate Chest: CTAB, normal effort, normal rate of breathing Abd: soft, non-distended Ext: no edema Psych: Pt's affect is appropriate. Pt is cooperative Skin: Clean and dry dressing R BKA    Neuro:   Mental Status: He is oriented to person, place, and time.     Comments: Decreased to light touch from mid thighs down B/L  Musculoskeletal:     Cervical back: Neck supple. No tenderness.     Comments: UE strength 4+/5 B/L- weaker than expected R HF and KE 4/5- limited by pain LLE_ HF 3-/5; KE 3+/5; DF 4/5; PF 4-/5   Has L olecranon bursitis- skin not open     Assessment/Plan: 1. Functional deficits which require 3+ hours per day of interdisciplinary therapy in a comprehensive inpatient rehab setting. Physiatrist is providing close team supervision and 24 hour management of active medical problems listed below. Physiatrist and rehab team continue to assess barriers to discharge/monitor patient progress toward  functional and medical goals  Care Tool:  Bathing  Bathing activity did not occur:  (Pt choosing to do simple spongebath due to increased fatigue.) Body parts bathed by patient: Front perineal area, Face   Body parts bathed by helper: Buttocks     Bathing assist Assist Level: Minimal Assistance - Patient > 75%     Upper Body Dressing/Undressing Upper body dressing   What is the patient wearing?: Pull over shirt    Upper body assist Assist Level: Set up assist    Lower Body Dressing/Undressing Lower body dressing      What is the patient wearing?: Underwear/pull up, Pants     Lower body assist Assist for lower body dressing: Moderate Assistance - Patient 50 - 74%     Toileting Toileting Toileting Activity did not occur (Clothing management and hygiene only): N/A (no void or bm)  Toileting assist Assist for toileting: Dependent - Patient 0%     Transfers Chair/bed transfer  Transfers assist     Chair/bed transfer assist level: Moderate Assistance - Patient 50 - 74%     Locomotion  Ambulation   Ambulation assist   Ambulation activity did not occur: Safety/medical concerns (Unable to assess gait or stair mobility at this time secondary to recent R BKA with significant weakness and poor endurance/standing tolerance)          Walk 10 feet activity   Assist  Walk 10 feet activity did not occur: Safety/medical concerns        Walk 50 feet activity   Assist Walk 50 feet with 2 turns activity did not occur: Safety/medical concerns         Walk 150 feet activity   Assist Walk 150 feet activity did not occur: Safety/medical concerns         Walk 10 feet on uneven surface  activity   Assist Walk 10 feet on uneven surfaces activity did not occur: Safety/medical concerns         Wheelchair     Assist Is the patient using a wheelchair?: Yes Type of Wheelchair: Manual    Wheelchair assist level: Supervision/Verbal cueing Max  wheelchair distance: 150'    Wheelchair 50 feet with 2 turns activity    Assist        Assist Level: Supervision/Verbal cueing   Wheelchair 150 feet activity     Assist      Assist Level: Supervision/Verbal cueing   Blood pressure (!) 104/52, pulse 67, temperature 98 F (36.7 C), resp. rate 18, height '5\' 8"'  (1.727 m), weight 62 kg, SpO2 100 %.   Medical Problem List and Plan: 1. 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  Antithrombotics: -DVT/anticoagulation:  Pharmaceutical: Eliquis             -antiplatelet therapy: N/A3  3. Pain Management:  oxycodone prn.              --Lyrica ITD-->75 mg, 50 mg 75 mg. Might benefit from increasing Lyrica due to nerve pain on B/L LE's AND phantom pain   Pt reports nerve pain and phantom pain under control, will continue current medications and monitor   -1/8 Increase lyrica to 21m TID 4. Insomnia: continue trazodone prn 5. Neuropsych/cognition: This patient is capable of making decisions on his own behalf. 6. Skin/Wound Care: Routine pressure relief measures.             --monitor incision for healing.             --protein supplement to promote wound healing.  7. Fluids/Electrolytes/Nutrition: Monitor I/O. Check CMET in am             --add protein supplement.  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              --will check A1C in am.              --continue to hold glipizide and resume if elevated.   -1/6 CBGs stable, continue to monitor trend  1/7: glipizide 2.564mresumed  1/8 monitor for response to medication change  CBG (last 3)  Recent Labs    03/24/22 1712 03/24/22 2044 03/25/22 0624  GLUCAP  199* 210* 138*      9. Afib w/RVR: Diagnosed 11/2021 s/p ablation 02/12/22 by Dr. Candis Musa.  --Monitor HR TID.               --Continue metoprolol and Eliquis. -in NSR  10. Persistent hyponatremia:Recheck Na level in am.   -1/5 Salt tabs started, recheck BMP tomorrow  1/8 Na improved to 134  11. IgA Lambda MGUS w/7% sideroblasts : Thrombocytopenia stable.   --was getting transfusion PTA due to illness.  --monitor for any signs of bleeding.  12. Acute on chronic anemia: Improved post transfusion w/one unit PRBC up to 8.8 from 6.7 yesterday.              -1/8 HGB at 8.5, continue to monitor 13. Congestive HFrEF: Heart healthy diet. Continue Lipitor and metoprolol. Daily weight.  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  16. Loose stools- have stopped pt's scheduled bowel meds- isn't on ABX- might Benefit from fiber if things don't improve.  -1/5 reports improved, continue to monitor 17. Hypotensive: will maintain lopressor given afib, continue to monitor BP 18. Hypokalemia  -1/8 Kdur 61mq today, recheck tomorrow      LOS: 4 days A FACE TO FACE EVALUATION WAS PERFORMED  YJennye Boroughs1/10/2022, 8:07 AM

## 2022-03-25 NOTE — Progress Notes (Signed)
Occupational Therapy Session Note  Patient Details  Name: David Joseph MRN: [id number] Date of Birth: [birth year]  Session 1 Today's Date: 03/25/2022 OT Individual Time: 3329-5188 OT Individual Time Calculation (min): 70 min    Session 2 Today's Date: 03/25/2022 OT Individual Time: 1300-1400 OT Individual Time Calculation (min): 60 min    Short Term Goals: Week 1:  OT Short Term Goal 1 (Week 1): Pt will complete LB dressing with Min A + LRAD. OT Short Term Goal 2 (Week 1): Pt will complete toileting activities with Min A + LRAD. OT Short Term Goal 3 (Week 1): Pt will complete full-body shower with Mod A + LRAD.  Skilled Therapeutic Interventions/Progress Updates:    Session 1 Pt received supine with no c/o pain, agreeable to OT session. There was a quarter size area of drainage from his residual limb on the shrinker, anterior and middle of the limb. Education provided re Designer, industrial/product daily, donning technique, desensitization techniques, monitoring his limb for changes, and shrinker care. He was receptive to all education and engaged in his care. Max A to Web designer. Washed it out in the sink and laid to dry. Donned limb guard with education provided re use. He came to EOB with (S). Offloading shoe donned to the LLE. CGA to stand from EOB with BUE on the RW (reports this puts less strain through his painful wrists- baseline). Encouraged him to bring in custom wrist braces he was describing help at home. He required mod A to don pants in standing. Stand pivot to the w/c with CGA. He propelled the w/c to the therapy gym, making it about 100 ft before needing a break. Difficulty following cueing for propulsion technique to protect shoulders and for energy conservation. He propelled the manual chair up a ramp with mod cueing for technique. 2 trials completed. Discussed request for power mobility d/t sunken living room in his room (already ramped) and long ramp to enter home, as well as d/t  shoulder issues baseline. Agree this would allow pt to be independent in the home, will discuss with PT. He completed 2x10 standing level static balance activities to challenge single limb stance stability needed during ADLs. He returned to his room. Pt was left sitting up in the w/c with all needs met  and call bell within reach.     Session 2 Pt received supine, requesting pain medication- RN notified and she provided shortly thereafter. He came to EOB with (S). Offloading shoe donned by pt. Limb guard donned with max A. He stood with heavy mod A to fasten it properly. Stand pivot with poor pivoting, pt lunging for chair with mod A. Pt visibly fatigued. Pt propelled the w/c 50 ft before needing a rest break. Another stand pivot to the mat with similar assist levels and similar lunge to mat prematurely. Education provided on safety and fall risk. He transitioned into prone on the mat. He was provided education on benefits of prone positioning on hips and knee neutral positioning. He completed 2x10 R/L glute activation with hip extension, requiring min-mod facilitation as he fatigued. He completed 2x10 scapular retractions with attempted back extension but pt with very weak posterior chain in general limiting this movement despite facilitation from OT. Carryover to improved balance standing. He came to [facility] and completed sit > stand with prolonged static stand to challenge endurance (pt unable to remove UE from RW d/t fatigue). He completed 4 repetitions with min A overall, cueing required for LLE placement/positioning. He  returned to his room and was left supine with all needs met, bed alarm set.    Therapy Documentation Precautions:  Precautions Precautions: Fall Precaution Comments: NWB R LE Required Braces or Orthoses:  Restrictions Weight Bearing Restrictions: Yes RLE Weight Bearing: Non weight bearing Other Position/Activity Restrictions: R BKA with limb guard and L off-loading shoe with toe  protection.   Therapy/Group: Individual Therapy  Curtis Sites 03/25/2022, 6:35 AM

## 2022-03-25 NOTE — Progress Notes (Signed)
Patient ID: David Joseph, male   DOB: Jun 14, 1940, 82 y.o.   MRN: [id number]  met with pt and wife to discuss wife';s questions she wants pt to be evaluated for a power chair while here due to she is unable to push him in a manuel due to her bilateral shoulder issues. Have messaged PT and OT regarding this and will see if a wheelchair evaluation can be set up while here. Pt is wanting to make sue he is not sent home too early due to his wife is 80 yo and can not physically assist him. Have already done ramp within the home. Will work on discharge needs aware team conference is on Wednesday

## 2022-03-25 NOTE — Progress Notes (Signed)
Physical Therapy Session Note  Patient Details  Name: David Joseph MRN: [id number] Date of Birth: [birth year]  Today's Date: 03/25/2022 PT Individual Time: 0945-1100 PT Individual Time Calculation (min): 75 min   Short Term Goals: Week 1:  PT Short Term Goal 1 (Week 1): Patient will perform sit/stand with RW and MinA PT Short Term Goal 2 (Week 1): Patient will perform bed/chair transfers with RW and MinA PT Short Term Goal 3 (Week 1): Patient will initiate gait training of distances <50' with RW  Skilled Therapeutic Interventions/Progress Updates:  Patient greeted supine in bed with spouse present and agreeable to PT treatment session. Patient's spouse showed therapist images of home entrance, current ramp and plan for chair lift. Therapist provided spouse with a measurement sheet in order to ensure safe entry into home. Spouse and patient asking for a power scooter in order to improve patient's independence within the home since spouse is unable to propel manual wheelchair due to B shoulder weakness. Therapist spoke with CSW regarding available vendors for their insurance- David Joseph was recommended and therapist will email David Joseph this afternoon in order to schedule a follow-up evaluation. Patient transitioned from supine to sitting EOB without the use of the bed rail and Supv. Patient then stood from elevated EOB with RW and Min/ModA for initiating standing- Patient then pivoted to the wheelchair with RW and MinA with therapist required to guide hips into wheelchair in order to ensure safety. VC for keeping L knee straight throughout transfer.   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   Patient performed squat pivot transfer to/from wheelchair to mat table with MinA- VC and demonstration for proper technique with good effort noted, however inconsistent ability to  recall proper head/hips relationship.   Patient performed wheelchair push-ups from edge of mat table in order to improve LE strength- Patient performed 2 x 5 with seated rest break in between and complaints of increased B wrist pain.   Patient transitioned to/from sitting edge of mat table and supine with supv. Supine therex performed in order to increase B LE strength and ROM for improved functional mobility-  L SAQ with 4#, 3 x 12 R SLR AROM, 3 x 10 B glute squeeze, 3 x 15 Prone positioning x5 minutes for sustained R hip flexor stretch   Patient wheeled back to his room and performed squat pivot transfer from wheelchair to sitting EOB with CGA and VC for proper hand placement/sequencing. Patient transitioned from sitting EOB to supine with Supv. Therapist removed post-op shoe and limbguard while sitting EOB. Patient left supine in bed with bed alarm on, call bell within reach and all needs met.    Therapy Documentation Precautions:  Precautions Precautions: Fall Precaution Comments: NWB R LE Required Braces or Orthoses:  Restrictions Weight Bearing Restrictions: Yes RLE Weight Bearing: Non weight bearing Other Position/Activity Restrictions: R BKA with limb guard and L off-loading shoe with toe protection.   Therapy/Group: Individual Therapy  David Joseph 03/25/2022, 7:51 AM

## 2022-03-26 DIAGNOSIS — E1142 Type 2 diabetes mellitus with diabetic polyneuropathy: Secondary | ICD-10-CM | POA: Diagnosis not present

## 2022-03-26 DIAGNOSIS — E876 Hypokalemia: Secondary | ICD-10-CM | POA: Diagnosis not present

## 2022-03-26 DIAGNOSIS — Z89511 Acquired absence of right leg below knee: Secondary | ICD-10-CM | POA: Diagnosis not present

## 2022-03-26 DIAGNOSIS — G546 Phantom limb syndrome with pain: Secondary | ICD-10-CM | POA: Diagnosis not present

## 2022-03-26 DIAGNOSIS — I959 Hypotension, unspecified: Secondary | ICD-10-CM

## 2022-03-26 LAB — GLUCOSE, CAPILLARY
Glucose-Capillary: 87 mg/dL (ref 70–99)
Glucose-Capillary: 169 mg/dL — ABNORMAL HIGH (ref 70–99)
Glucose-Capillary: 163 mg/dL — ABNORMAL HIGH (ref 70–99)
Glucose-Capillary: 125 mg/dL — ABNORMAL HIGH (ref 70–99)

## 2022-03-26 LAB — POTASSIUM: Potassium: 3.6 mmol/L (ref 3.5–5.1)

## 2022-03-26 NOTE — Progress Notes (Signed)
Physical Therapy Session Note  Patient Details  Name: David Joseph MRN: [id number] Date of Birth: 11-20-1940  Today's Date: 03/26/2022 PT Individual Time: 0800-0915 PT Individual Time Calculation (min): 75 min   Short Term Goals: Week 1:  PT Short Term Goal 1 (Week 1): Patient will perform sit/stand with RW and MinA PT Short Term Goal 2 (Week 1): Patient will perform bed/chair transfers with RW and MinA PT Short Term Goal 3 (Week 1): Patient will initiate gait training of distances <50' with RW  Skilled Therapeutic Interventions/Progress Updates:    Pt recd on [facility] with NT completing toileting. Therapist assisted with Sit to stand to RW with mod A. Pt reports 4/10 pain, premedicated. Rest and positioning provided as needed. Performed Sit to stand to RW with mod A x 4 while dressing with assist/encouragement to attempt threading on his own. Pt was able to successfully thread pants and underwear and pull underwear over hips but required assist to pull pants over hips. NT swapped BSC with w/c for time. Donned surgical shoe tot A for time. Donned limb guard with increased time d/t novel type of limb guard and pt being unable to direct care. Therapist printed instructions from manufacturer website and left in room. Pt propelled w/c with BUE x 100 ft and x 50 ft with short rest break for BUE endurance. Light min A to counteract w/c pulling to L. Pt performed mod A Stand pivot transfer to mat table, noting pt is pivoting and unable to clear foot. After discussing concern over his foot wound with MD during rounds, therapist provided education on hopping to protect foot. With this cueing, pt was able to perform transfer with min A and just barely clearing floor. Transfer noted to also be much safer as pt maintained better control of RW when not pivoting. While on mat table, pt instructed in donning and doffing limb guard x 2 with cues and then with supervision so pt can direct care or don with set up only in  future sessions. Pt directed in pushing up from w/c with L hand and was able to stand with CGA-light min A throughout session from slightly elevated mat table and w/c. Pt performed 2 x 6 Sit to stand to RW in this manner, demoing improved independence. Pt returned to room and remained in w/c, was left with all needs in reach and alarm active.     Therapy Documentation Precautions:  Precautions Precautions: Fall Precaution Comments: NWB R LE Required Braces or Orthoses:  (limb guard) Restrictions Weight Bearing Restrictions: Yes RLE Weight Bearing: Non weight bearing Other Position/Activity Restrictions: R BKA with limb guard and L off-loading shoe with toe protection. General:       Therapy/Group: Individual Therapy  Mickel Fuchs 03/26/2022, 8:56 AM

## 2022-03-26 NOTE — Progress Notes (Addendum)
PROGRESS NOTE   Subjective/Complaints: Pt working in the gym with therapy this AM.  Reports nursing has placed dressings to abrasions on his left foot. No additional concerns or complaints.  Reports pain is under control.   ROS: Hearing loss-chronic, neck pain, R face numbness, denies CP or SOB, denies Rash. No N/V, denies residual limb pain + phantom pain  Objective:   No results found. Recent Labs    03/25/22 0637  WBC 4.1  HGB 8.5*  HCT 25.7*  PLT 176    Recent Labs    03/23/22 1149 03/25/22 0637 03/26/22 0539  NA 130* 134*  --   K 3.8 3.2* 3.6  CL 96* 99  --   CO2 24 27  --   GLUCOSE 194* 123*  --   BUN 12 12  --   CREATININE 0.78 0.63  --   CALCIUM 8.2* 7.8*  --      Intake/Output Summary (Last 24 hours) at 03/26/2022 0818 Last data filed at 03/26/2022 0700 Gross per 24 hour  Intake 672 ml  Output 2100 ml  Net -1428 ml         Physical Exam: Vital Signs Blood pressure 108/61, pulse 73, temperature 97.9 F (36.6 C), temperature source Oral, resp. rate 20, height '5\' 8"'  (1.727 m), weight 62 kg, SpO2 100 %.  Gen: no distress, normal appearing, working with therapy HEENT: oral mucosa pink and moist, NCAT Cardio: RRR Chest: CTAB, normal effort, normal rate of breathing Abd: soft, non-distended Ext: no edema, limb guard in place RLE Psych: Pt's affect is appropriate. Pt is cooperative Skin: warm and dry   Neuro:   Mental Status: He is oriented to person, place, and time.     Comments: Decreased to light touch from mid thighs down B/L  Musculoskeletal:     Cervical back: Neck supple. No tenderness.     Comments: UE strength 4+/5 B/L- weaker than expected R HF and KE 4/5- limited by pain LLE_ HF 3-/5; KE 3+/5; DF 4/5; PF 4-/5   Has L olecranon bursitis- skin not open   Imaging from nursing yesterday below       Assessment/Plan: 1. Functional deficits which require 3+ hours per day of  interdisciplinary therapy in a comprehensive inpatient rehab setting. Physiatrist is providing close team supervision and 24 hour management of active medical problems listed below. Physiatrist and rehab team continue to assess barriers to discharge/monitor patient progress toward functional and medical goals  Care Tool:  Bathing  Bathing activity did not occur:  (Pt choosing to do simple spongebath due to increased fatigue.) Body parts bathed by patient: Front perineal area, Face   Body parts bathed by helper: Buttocks     Bathing assist Assist Level: Minimal Assistance - Patient > 75%     Upper Body Dressing/Undressing Upper body dressing   What is the patient wearing?: Pull over shirt    Upper body assist Assist Level: Set up assist    Lower Body Dressing/Undressing Lower body dressing      What is the patient wearing?: Underwear/pull up, Pants     Lower body assist Assist for lower body dressing: Moderate Assistance - Patient  50 - 74%     Toileting Toileting Toileting Activity did not occur Landscape architect and hygiene only): N/A (no void or bm)  Toileting assist Assist for toileting: Dependent - Patient 0%     Transfers Chair/bed transfer  Transfers assist     Chair/bed transfer assist level: Moderate Assistance - Patient 50 - 74%     Locomotion Ambulation   Ambulation assist   Ambulation activity did not occur: Safety/medical concerns (Unable to assess gait or stair mobility at this time secondary to recent R BKA with significant weakness and poor endurance/standing tolerance)          Walk 10 feet activity   Assist  Walk 10 feet activity did not occur: Safety/medical concerns        Walk 50 feet activity   Assist Walk 50 feet with 2 turns activity did not occur: Safety/medical concerns         Walk 150 feet activity   Assist Walk 150 feet activity did not occur: Safety/medical concerns         Walk 10 feet on uneven  surface  activity   Assist Walk 10 feet on uneven surfaces activity did not occur: Safety/medical concerns         Wheelchair     Assist Is the patient using a wheelchair?: Yes Type of Wheelchair: Manual    Wheelchair assist level: Supervision/Verbal cueing Max wheelchair distance: 150'    Wheelchair 50 feet with 2 turns activity    Assist        Assist Level: Supervision/Verbal cueing   Wheelchair 150 feet activity     Assist      Assist Level: Supervision/Verbal cueing   Blood pressure 108/61, pulse 73, temperature 97.9 F (36.6 C), temperature source Oral, resp. rate 20, height '5\' 8"'  (1.727 m), weight 62 kg, SpO2 100 %.   Medical Problem List and Plan: 1. 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  Antithrombotics: -DVT/anticoagulation:  Pharmaceutical: Eliquis             -antiplatelet therapy: N/A3  3. Pain Management:  oxycodone prn.              --Lyrica ITD-->75 mg, 50 mg 75 mg. Might benefit from increasing Lyrica due to nerve pain on B/L LE's AND phantom pain   Pt reports nerve pain and phantom pain under control, will continue current medications and monitor   -1/8 Increase lyrica to 17m TID  -1/9 reports pain controlled continue to monitor 4. Insomnia: continue trazodone prn 5. Neuropsych/cognition: This patient is capable of making decisions on his own behalf. 6. Skin/Wound Care: Routine pressure relief measures.             --monitor incision for healing.             --protein supplement to promote wound healing.  7. Fluids/Electrolytes/Nutrition: Monitor I/O. Check CMET in am             --add protein supplement.  8. 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              --  will check A1C in am.              --continue to hold glipizide and resume if elevated.   -1/6 CBGs stable, continue to monitor trend  1/7: glipizide 2.28m resumed  1/8 monitor for response to medication change  1/9 well controlled  CBG (last 3)  Recent Labs    03/25/22 1631 03/25/22 2118 03/26/22 0523  GLUCAP 238* 76 87     9. Afib w/RVR: Diagnosed 11/2021 s/p ablation 02/12/22 by Dr. ACandis Musa  --Monitor HR TID.               --Continue metoprolol and Eliquis. -in NSR  10. Persistent hyponatremia:Recheck Na level in am.   -1/5 Salt tabs started, recheck BMP tomorrow  1/8 Na improved to 134  11. IgA Lambda MGUS w/7% sideroblasts : Thrombocytopenia stable.   --was getting transfusion PTA due to illness.  --monitor for any signs of bleeding.  12. Acute on chronic anemia: Improved post transfusion w/one unit PRBC up to 8.8 from 6.7 yesterday.              -1/8 HGB at 8.5, continue to monitor 13. Congestive HFrEF: Heart healthy diet. Continue Lipitor and metoprolol. Daily weight.  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  16. Loose stools- have stopped pt's scheduled bowel meds- isn't on ABX- might Benefit from fiber if things don't improve.  -1/5 reports improved, continue to monitor 17. Hypotensive: will maintain lopressor given afib, continue to monitor BP BP stable overall, continue to monitor    03/26/2022    7:43 AM 03/26/2022    3:42 AM 03/26/2022    3:21 AM  Vitals with BMI  Systolic [id number] Diastolic 61 53 58  Pulse 73 66 68    18. Hypokalemia  -1/8 Kdur 49m today, recheck tomorrow  -1/9 K+ improved to 3.6      LOS: 5 days A FACE TO FACE  EVALUATION WAS PERFORMED  YuJennye Boroughs/11/2022, 8:18 AM

## 2022-03-26 NOTE — Progress Notes (Signed)
Occupational Therapy Session Note  Patient Details  Name: David Joseph MRN: [id number] Date of Birth: [birth year]  Today's Date: 03/26/2022 OT Individual Time: 1351-1500 OT Individual Time Calculation (min): 69 min    Short Term Goals: Week 1:  OT Short Term Goal 1 (Week 1): Pt will complete LB dressing with Min A + LRAD. OT Short Term Goal 2 (Week 1): Pt will complete toileting activities with Min A + LRAD. OT Short Term Goal 3 (Week 1): Pt will complete full-body shower with Mod A + LRAD.  Skilled Therapeutic Interventions/Progress Updates:   Pt received supine with no c/o pain, agreeable to OT session. He requested to wash his bottom. Discussed home toileting tasks and possible AE needs. He came to standing (offloading shoe on) with CGA from elevated EOB. He was able to remove his LUE from the RW in stnading and complete peri hygiene in bent over posture standing. He was unable to complete hygiene with his RUE and maintain standing balance. He required several seated rest break before finishing. He was able to manage clothing with alternating R/L UE with CGA for standing balance assist. Limb guard donned with mod A. Stand pivot to the w/c with min A with the RW. He was able to propel the w/c about 100 ft with poor shoulder extension overall despite ongoing cueing nad education. He was taken to the therapy gym. He worked on functional reaching in standing with alternating UE release from the RW to challenge dynamic balance for carryover to dressing and toileting tasks. The first trial pt required mod A overall especially with the RUE coming off the RW. He was able to tolerate the second and third trial much better with only min A required and much more consistent midline orientation. He stood for 1 min each trial. He then transferred to the mat and Pt completes 3x10 dowel rod therex for BUE shoulder strengthening required for BADLs/functional transfers as follows with demo cuing and 6 # dowel rod-  shoulder press, horizontal chest press, shoulder flexion, and bicep flexion. He returned to the w/c with min A and was taken back to his room. Pt was left sitting up in the recliner with all needs met and call bell within reach.    Therapy Documentation Precautions:  Precautions Precautions: Fall Precaution Comments: NWB R LE Required Braces or Orthoses: Restrictions Weight Bearing Restrictions: Yes RLE Weight Bearing: Non weight bearing Other Position/Activity Restrictions: R BKA with limb guard and L off-loading shoe with toe protection.  Therapy/Group: Individual Therapy  Curtis Sites 03/26/2022, 6:18 AM

## 2022-03-26 NOTE — Progress Notes (Signed)
Physical Therapy Session Note  Patient Details  Name: David Joseph MRN: [id number] Date of Birth: 09/20/1940  Today's Date: 03/26/2022 PT Individual Time: 1000-1100 PT Individual Time Calculation (min): 60 min   Short Term Goals: Week 1:  PT Short Term Goal 1 (Week 1): Patient will perform sit/stand with RW and MinA PT Short Term Goal 2 (Week 1): Patient will perform bed/chair transfers with RW and MinA PT Short Term Goal 3 (Week 1): Patient will initiate gait training of distances <50' with RW  Skilled Therapeutic Interventions/Progress Updates:  Patient greeted sitting upright in wheelchair in room and agreeable to PT treatment session. Patient wheeled to rehab storage room to show him various different types of equipment- Power wheelchair, custom wheelchair vs power scooter (which patient and wife were requesting). Therapist spent first part of treatment session providing education, demonstration and discussing different possibilities for discharge home in order to ensure safety. Patient reporting desire for power wheelchair, however states he would only need it for 4 months until he receives his prosthetic. Therapist spent time educating patient regarding CLOF and level of function he needs to be at in order to be successful with a prosthetic, patient reported understanding and desire to wait until tomorrow to make a decision so his wife can be present.   Patient performed sit/stand x10 with RW and MinA for initiating standing- VC for proper hand placement with inconsistent carryover noted. During the final stand, patient required tactile cues for improved L TKE secondary to weakness and fatigue.   Patient gait trained x3', x5' with RW and CGA with therapist bringing wheelchair behind for safety- Patient demonstrated decreased L foot clearance and hop length with increased knee flexion. Patient required extended seated rest break in between gait trials secondary to fatigue/weakness.    Seated therex performed in order to increase LE strength for improved functional mobility- L LAQ with 4#, 3 x 10 L hip flexion with 4#, 3 x 10  Patient returned to his room sitting via wheelchair and performed sit/stand with RW and MinA- Patient then pivoted toward EOB with RW and MinA with increased difficulty noted hopping posteriorly. Patient transitioned from sitting EOB to supine with supv. Once limbguard was removed, R residual limb noted to have increased drainage- RN notified and aware. Patient left supine in bed with bed alarm on, call bell within reach and all needs met.   Therapy Documentation Precautions:  Precautions Precautions: Fall Precaution Comments: NWB R LE Required Braces or Orthoses: L post-op shoe; Limbguard R LE Restrictions Weight Bearing Restrictions: Yes RLE Weight Bearing: Non weight bearing Other Position/Activity Restrictions: R BKA with limb guard and L off-loading shoe with toe protection.   Therapy/Group: Individual Therapy  Keagen Heinlen 03/26/2022, 7:49 AM

## 2022-03-26 NOTE — Progress Notes (Signed)
Occupational Therapy Session Note  Patient Details  Name: David Joseph MRN: [id number] Date of Birth: 1940/12/09  {CHL IP REHAB OT TIME CALCULATIONS:304400400}  {CHL IP REHAB OT TIME CALCULATIONS:304400400}  Short Term Goals: Week 1:  OT Short Term Goal 1 (Week 1): Pt will complete LB dressing with Min A + LRAD. OT Short Term Goal 2 (Week 1): Pt will complete toileting activities with Min A + LRAD. OT Short Term Goal 3 (Week 1): Pt will complete full-body shower with Mod A + LRAD.  Skilled Therapeutic Interventions/Progress Updates:   Session 1: Pt received *** for skilled OT session with focus on ***. Pt agreeable to interventions, demonstrating overall *** mood. Pt reported ***/10 pain, stating "***" in reference to ***. OT offering intermediate rest breaks and positioning suggestions throughout session to address pain/fatigue and maximize participation/safety in session.    Pt remained *** with all immediate needs met at end of session. Pt continues to be appropriate for skilled OT intervention to promote further functional independence.   Session 2:  Pt received *** for skilled OT session with focus on ***. Pt agreeable to interventions, demonstrating overall *** mood. Pt reported ***/10 pain, stating "***" in reference to ***. OT offering intermediate rest breaks and positioning suggestions throughout session to address pain/fatigue and maximize participation/safety in session.    Pt remained *** with all immediate needs met at end of session. Pt continues to be appropriate for skilled OT intervention to promote further functional independence.    Therapy Documentation Precautions:  Precautions Precautions: Fall Precaution Comments: NWB R LE Required Braces or Orthoses:  ((R) Limb guard/ (L) Off-loading shoe) Restrictions Weight Bearing Restrictions: Yes RLE Weight Bearing: Non weight bearing Other Position/Activity Restrictions: R BKA with limb guard and L off-loading  shoe with toe protection.   Therapy/Group: Individual Therapy  Maudie Mercury, OTR/L, MSOT  03/26/2022, 10:45 PM

## 2022-03-27 DIAGNOSIS — I739 Peripheral vascular disease, unspecified: Secondary | ICD-10-CM

## 2022-03-27 DIAGNOSIS — E1142 Type 2 diabetes mellitus with diabetic polyneuropathy: Secondary | ICD-10-CM | POA: Diagnosis not present

## 2022-03-27 DIAGNOSIS — I959 Hypotension, unspecified: Secondary | ICD-10-CM | POA: Diagnosis not present

## 2022-03-27 DIAGNOSIS — Z89511 Acquired absence of right leg below knee: Secondary | ICD-10-CM | POA: Diagnosis not present

## 2022-03-27 DIAGNOSIS — G546 Phantom limb syndrome with pain: Secondary | ICD-10-CM | POA: Diagnosis not present

## 2022-03-27 LAB — GLUCOSE, CAPILLARY
Glucose-Capillary: 89 mg/dL (ref 70–99)
Glucose-Capillary: 171 mg/dL — ABNORMAL HIGH (ref 70–99)
Glucose-Capillary: 249 mg/dL — ABNORMAL HIGH (ref 70–99)
Glucose-Capillary: 105 mg/dL — ABNORMAL HIGH (ref 70–99)

## 2022-03-27 NOTE — Patient Care Conference (Signed)
Inpatient RehabilitationTeam Conference and Plan of Care Update Date: 03/27/2022   Time: 12:32 PM    Patient Name: David Joseph      Medical Record Number: [id number]  Date of Birth: September 11, 1940 Sex: Male         Room/Bed: 4W02C/4W02C-01 Payor Info: Payor: MEDICARE / Plan: MEDICARE PART A AND B / Product Type: *No Product type* /    Admit Date/Time:  03/21/2022 12:20 PM  Primary Diagnosis:  Unilateral complete BKA, right, subsequent encounter [facility])  Hospital Problems: Principal Problem:   Unilateral complete BKA, right, subsequent encounter (HCC) Active Problems:   Acute blood loss anemia (ABLA)   Olecranon bursitis of left elbow   Chronic systolic congestive heart failure (HCC)   Anemia   S/P unilateral BKA (below knee amputation), right [facility])   Hypotension    Expected Discharge Date: Expected Discharge Date: 04/05/22  Team Members Present: Physician leading conference: Dr. Fanny Dance Social Worker Present: Dossie Der, LCSW Nurse Present: Chana Bode, RN PT Present: Amedeo Plenty, PT OT Present: Jake Shark, OT PPS Coordinator present : Fae Pippin, SLP     Current Status/Progress Goal Weekly Team Focus  Bowel/Bladder   Continent of B/B. LBM 03/25/22   Remain continent   Asssit with toilet needs    Swallow/Nutrition/ Hydration               ADL's   (S) UB ADLs, min- CGA LB ADLs, CGA-min A transfers   Mod I, will downgrade to (S)   ADLs, transfers, endurance, AE use, d/c planning    Mobility   Supv for bed mobility; CGA/ModA for sit/stands and transfers with RW; MinA/CGA for squat pivot transfers; MinA for gait up to 8' with RW and wheelchair follow- Limited be L LE weakness, poor endurance/activity tolerance, B wrist/hand arthritis   ModI at the wheelchair level  Global strengthening, dynamic stability, pre-prosthetic education, gait, transfers, sit/stands, wheelchair mobility, discharge planning    Communication                 Safety/Cognition/ Behavioral Observations               Pain   Occasioanlly asked for pain meds   <3/10   Assess Q4 and prn    Skin   Surgical incision Lt leg   Promote healing, Prevent infection and skin breakdown  assess QS and prn      Discharge Planning:  Home with wife who has shoulder issues and can not lift. Wife is here daily and observes in therapies.   Team Discussion: Patient expressed concern over PVD and left 2nd and 3rd toe discoloration; "that's how it started on the right foot". MD aware, Orders received for toes OTA, wear [employer] shoe when up.  Patient on target to meet rehab goals: yes, currently needs supervision for upper body care and min assist for lower body care. Transfers with SBA. Completes sit - stand with CGA but can be mod assist with fatigue. Completes squat pivots with min assist and ambulate up to 8' however will not be a functional ambulator at discharge; power wheelchair consult.  *See Care Plan and progress notes for long and short-term goals.   Revisions to Treatment Plan:  Power wheelchair consult Downgraded goals to supervision level   Teaching Needs: Safety, skin care, medications, transfers, toileting, etc.  Current Barriers to Discharge: Decreased caregiver support and Home enviroment access/layout  Possible Resolutions to Barriers: Family education HH follow up services DME: W/C  Medical Summary Current Status: R BKA, PVD, DM2, Hypotension, hyponatremia, phantom pain  Barriers to Discharge: Self-care education;Medical stability;Electrolyte abnormality  Barriers to Discharge Comments: R BKA, PVD, DM2, Hypotension, hyponatremia, phantom pain Possible Resolutions to Barriers/Weekly Focus: lyrica adjusted, monitor skin and wound healing, monitor BMP, potassium supplmeent given   Continued Need for Acute Rehabilitation Level of Care: The patient requires daily medical management by a physician with specialized training in  physical medicine and rehabilitation for the following reasons: Direction of a multidisciplinary physical rehabilitation program to maximize functional independence : Yes Medical management of patient stability for increased activity during participation in an intensive rehabilitation regime.: Yes Analysis of laboratory values and/or radiology reports with any subsequent need for medication adjustment and/or medical intervention. : Yes   I attest that I was present, lead the team conference, and concur with the assessment and plan of the team.   Chana Bode B 03/27/2022, 1:53 PM

## 2022-03-27 NOTE — Progress Notes (Addendum)
Patient ID: David Joseph, male   DOB: May 15, 1940, 82 y.o.   MRN: [id number] Met with pt and spoke with wife via telephone to give them a team conference update regarding goals of supervision and target discharge date of 1/19. Wife is hopeful the ramp will be done by then otherwise she has no way to get him into the home. She wonders if can be extended if needed. Aware a power chair evaluation will be done and will work on other discharge needs.  2:28 PM have sent demo information to [facility] for wheelchair evaluation later this week or early next week

## 2022-03-27 NOTE — Progress Notes (Addendum)
PROGRESS NOTE   Subjective/Complaints: Pt has is concerned about scabs on his LLE. Scabs have been there for sevaral weeks. He does feel they are slowly improving.   Reports pain is under control with tramadol  ROS: Hearing loss-chronic, neck pain, R face numbness, denies CP or SOB, denies Rash. No N/V, abdominal pain  + phantom pain  Objective:   No results found. Recent Labs    03/25/22 0637  WBC 4.1  HGB 8.5*  HCT 25.7*  PLT 176    Recent Labs    03/25/22 0637 03/26/22 0539  NA 134*  --   K 3.2* 3.6  CL 99  --   CO2 27  --   GLUCOSE 123*  --   BUN 12  --   CREATININE 0.63  --   CALCIUM 7.8*  --      Intake/Output Summary (Last 24 hours) at 03/27/2022 0830 Last data filed at 03/27/2022 0425 Gross per 24 hour  Intake 592 ml  Output 1250 ml  Net -658 ml         Physical Exam: Vital Signs Blood pressure (!) 122/50, pulse 66, temperature 98.2 F (36.8 C), resp. rate 17, height '5\' 8"'  (1.727 m), weight 62 kg, SpO2 99 %.  Gen: no distress, normal appearing, working with therapy HEENT: oral mucosa pink and moist, NCAT Cardio: RRR Chest: CTAB, normal effort, normal rate of breathing Abd: soft, non-distended, +BS Ext: no edema, limb guard in place RLE Psych: Pt's affect is appropriate. Pt is cooperative Skin: warm and dry   Neuro:   Mental Status: He is oriented to person, place, and time.     Comments: Decreased to light touch from mid thighs down B/L  Musculoskeletal:     Cervical back: Neck supple. No tenderness.     Comments: UE strength 4+/5 B/L- weaker than expected R HF and KE 4/5- limited by pain LLE_ HF 3-/5; KE 3+/5; DF 4/5; PF 4-/5   Has L olecranon bursitis- skin not open         Assessment/Plan: 1. Functional deficits which require 3+ hours per day of interdisciplinary therapy in a comprehensive inpatient rehab setting. Physiatrist is providing close team supervision and 24 hour  management of active medical problems listed below. Physiatrist and rehab team continue to assess barriers to discharge/monitor patient progress toward functional and medical goals  Care Tool:  Bathing  Bathing activity did not occur:  (Pt choosing to do simple spongebath due to increased fatigue.) Body parts bathed by patient: Front perineal area, Face   Body parts bathed by helper: Buttocks     Bathing assist Assist Level: Minimal Assistance - Patient > 75%     Upper Body Dressing/Undressing Upper body dressing   What is the patient wearing?: Pull over shirt    Upper body assist Assist Level: Set up assist    Lower Body Dressing/Undressing Lower body dressing      What is the patient wearing?: Underwear/pull up, Pants     Lower body assist Assist for lower body dressing: Moderate Assistance - Patient 50 - 74%     Toileting Toileting Toileting Activity did not occur Landscape architect and hygiene  only): N/A (no void or bm)  Toileting assist Assist for toileting: Dependent - Patient 0%     Transfers Chair/bed transfer  Transfers assist     Chair/bed transfer assist level: Moderate Assistance - Patient 50 - 74%     Locomotion Ambulation   Ambulation assist   Ambulation activity did not occur: Safety/medical concerns (Unable to assess gait or stair mobility at this time secondary to recent R BKA with significant weakness and poor endurance/standing tolerance)          Walk 10 feet activity   Assist  Walk 10 feet activity did not occur: Safety/medical concerns        Walk 50 feet activity   Assist Walk 50 feet with 2 turns activity did not occur: Safety/medical concerns         Walk 150 feet activity   Assist Walk 150 feet activity did not occur: Safety/medical concerns         Walk 10 feet on uneven surface  activity   Assist Walk 10 feet on uneven surfaces activity did not occur: Safety/medical concerns          Wheelchair     Assist Is the patient using a wheelchair?: Yes Type of Wheelchair: Manual    Wheelchair assist level: Supervision/Verbal cueing Max wheelchair distance: 150'    Wheelchair 50 feet with 2 turns activity    Assist        Assist Level: Supervision/Verbal cueing   Wheelchair 150 feet activity     Assist      Assist Level: Supervision/Verbal cueing   Blood pressure (!) 122/50, pulse 66, temperature 98.2 F (36.8 C), resp. rate 17, height '5\' 8"'  (1.727 m), weight 62 kg, SpO2 99 %.   Medical Problem List and Plan: 1. 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 Formulized individual treatment plan based on medical history, underlying problem and comorbidities.   -Power WC  2.  Antithrombotics: -DVT/anticoagulation:  Pharmaceutical: Eliquis             -antiplatelet therapy: N/A3  3. Pain Management:  oxycodone prn.              --Lyrica ITD-->75 mg, 50 mg 75 mg. Might benefit from increasing Lyrica due to nerve pain on B/L LE's AND phantom pain   Pt reports nerve pain and phantom pain under control, will continue current medications and monitor   -1/8 Increase lyrica to 63m TID  -1/9 reports pain controlled continue to monitor 4. Insomnia: continue trazodone prn 5. Neuropsych/cognition: This patient is capable of making decisions on his own behalf. 6. Skin/Wound Care: Routine pressure relief measures.             --monitor incision for healing.             --protein supplement to promote wound healing.  7. Fluids/Electrolytes/Nutrition: Monitor I/O. Check CMET in am              --add protein supplement.  8. 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              --will check A1C in am.              --continue to hold glipizide and resume if elevated.   -1/6 CBGs stable, continue to monitor trend  1/7: glipizide 2.75m resumed  1/8 monitor for response to medication change  1/10 CBGs well controlled  CBG (last 3)  Recent Labs    03/26/22 1702 03/26/22 2219 03/27/22 0548  GLUCAP 163* 125* 89     9. Afib w/RVR: Diagnosed 11/2021 s/p ablation 02/12/22 by Dr. ACandis Musa  --Monitor HR TID.               --Continue metoprolol and Eliquis. -in NSR  10. Persistent hyponatremia:Recheck Na level in am.   -1/5 Salt tabs started, recheck BMP tomorrow  1/8 Na improved to 134  11. IgA Lambda MGUS w/7% sideroblasts : Thrombocytopenia stable.   --was getting transfusion PTA due to illness.  --monitor for any signs of bleeding.  12. Acute on chronic anemia: Improved post transfusion w/one unit PRBC up to 8.8 from 6.7 yesterday.              -1/8 HGB at 8.5, continue to monitor 13. Congestive HFrEF: Heart healthy diet. Continue Lipitor and metoprolol. Daily weight. 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  16. Loose stools- have stopped pt's scheduled bowel meds- isn't on ABX- might Benefit from fiber if things don't improve.  -1/5 reports improved, continue to monitor 17. Hypotensive: will maintain lopressor given afib, continue to monitor BP 9/10 Soft but stable, continue lopressor    03/27/2022    4:21 AM 03/26/2022    8:46 PM 03/26/2022    1:43 PM  Vitals with BMI   Systolic [id number] Diastolic 50 52 47  Pulse 66 69 71    18. Hypokalemia  -1/8 Kdur 48m today, recheck tomorrow  -1/9 K+ improved to 3.6 19. L foot scabs.  Hx of PVD -Keep dry open to air. F/u with vascular after discharge.  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

## 2022-03-27 NOTE — Plan of Care (Signed)
Downgraded 1/10   Problem: Sit to Stand Goal: LTG:  Patient will perform sit to stand in prep for activites of daily living with assistance level (OT) Description: LTG:  Patient will perform sit to stand in prep for activites of daily living with assistance level (OT) Flowsheets (Taken 03/27/2022 1235) LTG: PT will perform sit to stand in prep for activites of daily living with assistance level: (downgraded 1/10- SD) Supervision/Verbal cueing   Problem: RH Bathing Goal: LTG Patient will bathe all body parts with assist levels (OT) Description: LTG: Patient will bathe all body parts with assist levels (OT) Flowsheets (Taken 03/27/2022 1235) LTG: Pt will perform bathing with assistance level/cueing: (downgraded 1/10- SD) Supervision/Verbal cueing   Problem: RH Toileting Goal: LTG Patient will perform toileting task (3/3 steps) with assistance level (OT) Description: LTG: Patient will perform toileting task (3/3 steps) with assistance level (OT)  Flowsheets (Taken 03/27/2022 1235) LTG: Pt will perform toileting task (3/3 steps) with assistance level: (downgraded 1/10- SD) Supervision/Verbal cueing   Problem: RH Simple Meal Prep Goal: LTG Patient will perform simple meal prep w/assist (OT) Description: LTG: Patient will perform simple meal prep with assistance, with/without cues (OT). Outcome: Not Applicable   Problem: RH Toilet Transfers Goal: LTG Patient will perform toilet transfers w/assist (OT) Description: LTG: Patient will perform toilet transfers with assist, with/without cues using equipment (OT) Flowsheets (Taken 03/27/2022 1235) LTG: Pt will perform toilet transfers with assistance level of: (downgraded 1/10- SD) Supervision/Verbal cueing   Problem: RH Tub/Shower Transfers Goal: LTG Patient will perform tub/shower transfers w/assist (OT) Description: LTG: Patient will perform tub/shower transfers with assist, with/without cues using equipment (OT) Flowsheets (Taken 03/27/2022  1235) LTG: Pt will perform tub/shower stall transfers with assistance level of: (downgraded 1/10- SD) Supervision/Verbal cueing

## 2022-03-27 NOTE — Progress Notes (Signed)
Physical Therapy Weekly Progress Note  Patient Details  Name: David Joseph MRN: [id number] Date of Birth: October 18, 1940  Beginning of progress report period: March 22, 2022 End of progress report period: March 27, 2022  Today's Date: 03/27/2022 PT Individual Time: 1330-1415 PT Individual Time Calculation (min): 45 min   Patient has met 2 of 3 short term goals. Patient is making progress toward his short and long-term goals since initial evaluation. Currently, patient requires supv for bed mobility, CGA/MinA for sit/stands, MinA for transfers with RW, MinA for gait up to 10' with RW, Supv for wheelchair mobility via power wheelchair.   Patient continues to demonstrate the following deficits muscle weakness, decreased cardiorespiratoy endurance, peripheral, and decreased standing balance and decreased balance strategies and therefore will continue to benefit from skilled PT intervention to increase functional independence with mobility.  Patient progressing toward long term goals..  Continue plan of care. Therapist to make revisions regarding ambulation goals as patient will not be a functional ambulator at discharge.  PT Short Term Goals Week 1:  PT Short Term Goal 1 (Week 1): Patient will perform sit/stand with RW and MinA PT Short Term Goal 1 - Progress (Week 1): Met PT Short Term Goal 2 (Week 1): Patient will perform bed/chair transfers with RW and MinA PT Short Term Goal 2 - Progress (Week 1): Met PT Short Term Goal 3 (Week 1): Patient will initiate gait training of distances <50' with RW PT Short Term Goal 3 - Progress (Week 1): Discontinued (comment) (Patient will discharge at the wheelchair level and will not be ambulatory) Week 2:  PT Short Term Goal 1 (Week 2): STG=LTG secondary to ELOS  Skilled Therapeutic Interventions/Progress Updates:  Patient greeted sitting upright in PWC and agreeable to PT treatment session. Patient and spouse decided they do want to move forward with  PWC evaluation so therapist sent an email to vendor and scheduled for Tuesday, 1/16, at [place]. Therapist educated patient on next steps, process, etc. Patient wheeled [name] throughout the rehab gym (>200') with Supv- VC for various buttons on controller with demonstration, however poor carryover noted. Patient was able to properly align [place] with mat table in main gym and required VC for turning the [name] off prior to any functional mobility. Patient performed sit/stand with CGA and transferred to/from [name] and mat table >4 times with VC for hopping L LE posteriorly toward the mat table with decreased foot clearance and ability noted. VC for placing a hand back prior to sitting with poor carryover, however no LOB noted. Patient returned to his room via [name] and aligned himself with the bed in order to prepare for transferring to bed at end of session. Therapist moved bed to correct side in room in order to simulate bed set-up at home. Patient performed sit/stand from [name] with RW and CGA and then transferred to sitting EOB with CGA/MinA when hopping posteriorly. Patient transitioned from sitting to supine with supv. Therapist educated patient on making sure PWC is plugged in each night. Patient left supine in bed with call bell within reach and all needs met.   Therapy Documentation Precautions:  Precautions Precautions: Fall Precaution Comments: NWB R LE Required Braces or Orthoses:  ((R) Limb guard/ (L) Off-loading shoe) Restrictions Weight Bearing Restrictions: Yes RLE Weight Bearing: Non weight bearing (right BKA) Other Position/Activity Restrictions: R BKA with limb guard and L off-loading shoe with toe protection.  Therapy/Group: Individual Therapy  [name] 03/27/2022, 7:57 AM

## 2022-03-27 NOTE — Progress Notes (Signed)
Occupational Therapy Session Note  Patient Details  Name: David Joseph MRN: [id number] Date of Birth: Aug 15, 1940  Today's Date: 03/27/2022 OT Individual Time: 1117-1205 OT Individual Time Calculation (min): 48 min    Short Term Goals: Week 1:  OT Short Term Goal 1 (Week 1): Pt will complete LB dressing with Min A + LRAD. OT Short Term Goal 2 (Week 1): Pt will complete toileting activities with Min A + LRAD. OT Short Term Goal 3 (Week 1): Pt will complete full-body shower with Mod A + LRAD.  Skilled Therapeutic Interventions/Progress Updates:    Pt received supine with no c/o pain, agreeable to OT session. Wife present. Session focused on introduction of power w/c mobility and discussion of home accesibility. Discussed AE needs, including BSC. Provided demonstration on use and need. Extensive discussion re power w/c vs manual chair vs scooter in the home. Provided education on insurance coverage. Power w/c retrieved and brought to room. Provided demonstration on use. Pt completed a stand pivot transfer to the power w/c with min A overall using the RW. He completed several turns and backed up with min A overall for positioning. Coordinated care with PT to continue this training this afternoon d/t time constraints. He was left sitting up with all needs met. Wife present.   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  Therapy/Group: Individual Therapy  Curtis Sites 03/27/2022, 6:28 AM

## 2022-03-27 NOTE — Plan of Care (Signed)
Goals revised in order to better reflect CLOF and supervision assistance upon discharge home.   Problem: RH Ambulation Goal: LTG Patient will ambulate in home environment (PT) Description: LTG: Patient will ambulate in home environment, # of feet with assistance (PT). Outcome: Not Applicable Flowsheets (Taken 03/27/2022 1622) LTG: Pt will ambulate in home environ  assist needed:: (Goal discharged to reflec CLOF and patient is to discharge home at the wheelchair level- S.R. 1/10) -- Note: Goal discharged to reflec CLOF and patient is to discharge home at the wheelchair level- S.R. 1/10    Problem: Sit to Stand Goal: LTG:  Patient will perform sit to stand with assistance level (PT) Description: LTG:  Patient will perform sit to stand with assistance level (PT) Flowsheets (Taken 03/27/2022 1622) LTG: PT will perform sit to stand in preparation for functional mobility with assistance level: (Revised to reflect CLOF and supv at discharge- S.R. 1/10) Supervision/Verbal cueing Note: Revised to reflect CLOF and supv at discharge- S.R. 1/10    Problem: RH Bed to Chair Transfers Goal: LTG Patient will perform bed/chair transfers w/assist (PT) Description: LTG: Patient will perform bed to chair transfers with assistance (PT). Flowsheets (Taken 03/27/2022 1622) LTG: Pt will perform Bed to Chair Transfers with assistance level: (Revised to reflect CLOF and supv at discharge- S.R. 1/10) Supervision/Verbal cueing Note: Revised to reflect CLOF and supv at discharge- S.R. 1/10    Problem: RH Wheelchair Mobility Goal: LTG Patient will propel w/c in community environment (PT) Description: LTG: Patient will propel wheelchair in community environment, # of feet with assist (PT) Flowsheets (Taken 03/27/2022 1622) LTG: Pt will propel w/c in community environ  assist needed:: (Power wheelchair) --

## 2022-03-28 DIAGNOSIS — G546 Phantom limb syndrome with pain: Secondary | ICD-10-CM | POA: Diagnosis not present

## 2022-03-28 DIAGNOSIS — E1142 Type 2 diabetes mellitus with diabetic polyneuropathy: Secondary | ICD-10-CM | POA: Diagnosis not present

## 2022-03-28 DIAGNOSIS — I5022 Chronic systolic (congestive) heart failure: Secondary | ICD-10-CM | POA: Diagnosis not present

## 2022-03-28 DIAGNOSIS — Z89511 Acquired absence of right leg below knee: Secondary | ICD-10-CM | POA: Diagnosis not present

## 2022-03-28 LAB — GLUCOSE, CAPILLARY
Glucose-Capillary: 99 mg/dL (ref 70–99)
Glucose-Capillary: 176 mg/dL — ABNORMAL HIGH (ref 70–99)
Glucose-Capillary: 227 mg/dL — ABNORMAL HIGH (ref 70–99)
Glucose-Capillary: 227 mg/dL — ABNORMAL HIGH (ref 70–99)

## 2022-03-28 NOTE — Progress Notes (Signed)
Physical Therapy Session Note  Patient Details  Name: David Joseph MRN: [id number] Date of Birth: August 30, 1940  Today's Date: 03/28/2022 PT Individual Time: 2878-6767 PT Individual Time Calculation (min): 49 min   Short Term Goals: Week 2:  PT Short Term Goal 1 (Week 2): STG=LTG secondary to ELOS  Skilled Therapeutic Interventions/Progress Updates: Pt presented in bed agreeable to therapy. Pt c/o R hip pain un-rated skin inspected with no notable marking. Pt  shrinker noted to be soiled. PTA changed shrinker in supine total A. Pt was able to thread pants in supine with minA and performed rolling L/R with supervision to pull pants over hips. PTA then donned limb guard and support strap total A. Performed supine to sit with supervision with bed  flat and PTA donned off loading shoe on L foot. Pt performed Sit to stand with CGA and increased effort noted and minA with significant increased effort hop pivot transfer to [place]. At several instances pt noted to attempt to lift L foot but unable to clear floor. Pt encouraged to push through BUE to engage triceps to facilitate lift but pt unable to generate enough power to lift as well as pt indicating significant increased pain through B wrists due to OA. Once transfer complete pt participated in [facility] navigation to 4W nsg station around day room and back to room. In larger spaces pt was able to safely navigate around obstacles however noted increased difficulty in smaller spaces with pt jamming PWC into bed. PTA assisting to clear pt from unsafe space and assisted pt in performing 180degree turn in room to face TV. Pt left in [place] at end of session with chair turned off, call bell within reach and needs met.      Therapy Documentation Precautions:  Precautions Precautions: Fall Precaution Comments: NWB R LE Required Braces or Orthoses:  ((R) Limb guard/ (L) Off-loading shoe) Restrictions Weight Bearing Restrictions: Yes RLE Weight Bearing: Non weight  bearing Other Position/Activity Restrictions: R BKA with limb guard and L off-loading shoe with toe protection. General:   Vital Signs:   Pain:   Mobility:   Locomotion :    Trunk/Postural Assessment :    Balance:   Exercises:   Other Treatments:      Therapy/Group: Individual Therapy  Altha Sweitzer 03/28/2022, 12:21 PM

## 2022-03-28 NOTE — Progress Notes (Signed)
PROGRESS NOTE   Subjective/Complaints: Continues to have phantom pain, reports lyrica is helping. No additional new concerns.   ROS: Hearing loss-chronic, neck pain, R face numbness, denies CP or SOB, denies Rash. No N/V, abdominal pain, HA + phantom pain +scabs in L toes  Objective:   No results found. No results for input(s): "WBC", "HGB", "HCT", "PLT" in the last 72 hours.  Recent Labs    03/26/22 0539  K 3.6     Intake/Output Summary (Last 24 hours) at 03/28/2022 0837 Last data filed at 03/28/2022 0833 Gross per 24 hour  Intake 358 ml  Output 1375 ml  Net -1017 ml         Physical Exam: Vital Signs Blood pressure 135/70, pulse 67, temperature 98.2 F (36.8 C), resp. rate 18, height '5\' 8"'  (1.727 m), weight 61 kg, SpO2 100 %.  Gen: no distress, normal appearing, in bed HEENT: oral mucosa pink and moist, NCAT, conjugate gaze Cardio: RRR Chest: CTAB, normal effort, normal rate of breathing Abd: soft, non-distended, +BS Ext: no edema, limb guard in place RLE Psych: Pt's affect is appropriate. Pt is cooperative Skin: warm and dry   Neuro:   Mental Status: He is oriented to person, place, and time.     Comments: Decreased to light touch from mid thighs down B/L  Musculoskeletal:     Cervical back: Neck supple. No tenderness.     Comments: UE strength 4+/5 B/L- weaker than expected R HF and KE 4/5- limited by pain LLE_ HF 3-/5; KE 3+/5; DF 4/5; PF 4-/5   Has L olecranon bursitis- skin not open  Scabs on L toes- unchanged from yesterday        Assessment/Plan: 1. Functional deficits which require 3+ hours per day of interdisciplinary therapy in a comprehensive inpatient rehab setting. Physiatrist is providing close team supervision and 24 hour management of active medical problems listed below. Physiatrist and rehab team continue to assess barriers to discharge/monitor patient progress toward  functional and medical goals  Care Tool:  Bathing  Bathing activity did not occur:  (Pt choosing to do simple spongebath due to increased fatigue.) Body parts bathed by patient: Front perineal area, Face   Body parts bathed by helper: Buttocks     Bathing assist Assist Level: Minimal Assistance - Patient > 75%     Upper Body Dressing/Undressing Upper body dressing   What is the patient wearing?: Pull over shirt    Upper body assist Assist Level: Set up assist    Lower Body Dressing/Undressing Lower body dressing      What is the patient wearing?: Underwear/pull up, Pants     Lower body assist Assist for lower body dressing: Moderate Assistance - Patient 50 - 74%     Toileting Toileting Toileting Activity did not occur (Clothing management and hygiene only): N/A (no void or bm)  Toileting assist Assist for toileting: Dependent - Patient 0%     Transfers Chair/bed transfer  Transfers assist     Chair/bed transfer assist level: Moderate Assistance - Patient 50 - 74%     Locomotion Ambulation   Ambulation assist   Ambulation activity did not occur: Safety/medical concerns (  Unable to assess gait or stair mobility at this time secondary to recent R BKA with significant weakness and poor endurance/standing tolerance)          Walk 10 feet activity   Assist  Walk 10 feet activity did not occur: Safety/medical concerns        Walk 50 feet activity   Assist Walk 50 feet with 2 turns activity did not occur: Safety/medical concerns         Walk 150 feet activity   Assist Walk 150 feet activity did not occur: Safety/medical concerns         Walk 10 feet on uneven surface  activity   Assist Walk 10 feet on uneven surfaces activity did not occur: Safety/medical concerns         Wheelchair     Assist Is the patient using a wheelchair?: Yes Type of Wheelchair: Manual    Wheelchair assist level: Supervision/Verbal cueing Max  wheelchair distance: 150'    Wheelchair 50 feet with 2 turns activity    Assist        Assist Level: Supervision/Verbal cueing   Wheelchair 150 feet activity     Assist      Assist Level: Supervision/Verbal cueing   Blood pressure 135/70, pulse 67, temperature 98.2 F (36.8 C), resp. rate 18, height '5\' 8"'  (1.727 m), weight 61 kg, SpO2 100 %.   Medical Problem List and Plan: 1. 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  Antithrombotics: -DVT/anticoagulation:  Pharmaceutical: Eliquis             -antiplatelet therapy: N/A3  3. Pain Management:  oxycodone prn.              --Lyrica ITD-->75 mg, 50 mg 75 mg. Might benefit from increasing Lyrica due to nerve pain on B/L LE's AND phantom pain   Pt reports nerve pain and phantom pain under control, will continue current medications and monitor   -1/8 Increase lyrica to 29m TID  -1/9 reports pain controlled continue to monitor  -1/11 Increase lyrica to 1081mTID 4. Insomnia: continue trazodone prn 5. Neuropsych/cognition: This patient is capable of making decisions on his own behalf. 6. Skin/Wound Care: Routine pressure relief measures.             --monitor incision for healing.             --protein supplement to promote wound healing.  7. Fluids/Electrolytes/Nutrition: Monitor I/O. Check CMET in am             --add protein supplement.  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              --will check A1C in am.              --continue to hold glipizide and resume if elevated.   -1/6 CBGs stable, continue to monitor trend  1/7: glipizide 2.84m30mesumed  1/8 monitor for response to medication  change  1/11 one elevated CBG recently but overall well controlled  CBG (last 3)  Recent Labs    03/27/22 1620 03/27/22 2100 03/28/22 0509  GLUCAP 249* 105* 99     9. Afib w/RVR: Diagnosed 11/2021 s/p ablation 02/12/22 by Dr. Candis Musa.  --Monitor HR TID.               --Continue metoprolol and Eliquis. -in NSR  10. Persistent hyponatremia:Recheck Na level in am.   -1/5 Salt tabs started, recheck BMP tomorrow  1/8 Na improved to 134  11. IgA Lambda MGUS w/7% sideroblasts : Thrombocytopenia stable.   --was getting transfusion PTA due to illness.  --monitor for any signs of bleeding.  12. Acute on chronic anemia: Improved post transfusion w/one unit PRBC up to 8.8 from 6.7 yesterday.              -1/8 HGB at 8.5, continue to monitor 13. Congestive HFrEF: Heart healthy diet. Continue Lipitor and metoprolol. Daily weight. 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  16. Loose stools- have stopped pt's scheduled bowel meds- isn't on ABX- might Benefit from fiber if things don't improve.  -1/5 reports improved, continue to monitor 17. Hypotensive: will maintain lopressor given afib, continue to monitor BP 9/10 Soft but stable, continue lopressor    03/28/2022    5:54 AM 03/28/2022    5:04 AM 03/27/2022    9:00 PM  Vitals with BMI  Weight 134 lbs 8 oz    BMI 88.91    Systolic  694 503  Diastolic  70 48  Pulse  67 71    18. Hypokalemia  -1/8 Kdur 76mq today, recheck tomorrow  -1/9 K+ improved to 3.6 19. L foot scabs.  Hx of PVD -Keep dry open to air. F/u with vascular after discharge.  -Had LLE endarterectomy with bovine patch  angioplasty of CFA, SFA and profunda,  fem-BK pop bypass with PTFE for CLI by Dr. 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

## 2022-03-28 NOTE — Evaluation (Signed)
Recreational Therapy Assessment and Plan  Patient Details  Name: David Joseph MRN: [id number] Date of Birth: [birth year] Today's Date: 03/28/2022  Rehab Potential:  Good ELOS:   d/c 1/19  Assessment Hospital Problem: Principal Problem:   Unilateral complete BKA, right, subsequent encounter (HCC) Active Problems:   Acute blood loss anemia (ABLA)   Olecranon bursitis of left elbow   Chronic systolic congestive heart failure (HCC)   Anemia   S/P unilateral BKA (below knee amputation), right [facility])     Past Medical History:      Past Medical History:  Diagnosis Date   Anemia     Arthritis      OA   Calcium pyrophosphate deposition disease (CPPD)     Diverticulitis      10 years ago   DVT (deep venous thrombosis) (HCC) 04/09/2015    left leg- recently   Heart failure (HCC)     Pneumonia 13 years ago   Presence of internal carotid stent     Prostate CA (HCC)     Rheumatoid arthritis (HCC)      Past Surgical History:       Past Surgical History:  Procedure Laterality Date   BACK SURGERY   03/18/1978    lower    basal cell areas removed   Sept 2013, 2012    left lower lip and chest   CERVICAL FUSION       COLONOSCOPY   10/17/2011   EYE SURGERY   2 years ago    both cataract   JOINT REPLACEMENT   March 2012, 02/2012    right knee, redo right knee   left middle finger surgery   05/17/2010   PROSTATECTOMY   12/16/2013    at [facility]   TONSILLECTOMY   60 years ago   TOTAL KNEE ARTHROPLASTY Left 10/09/2015    Procedure: LEFT TOTAL KNEE ARTHROPLASTY;  Surgeon: David Gross, MD;  Location: [facility];  Service: Orthopedics;  Laterality: Left;   TOTAL KNEE REVISION   02/19/2012    Procedure: TOTAL KNEE REVISION;  Surgeon: David Drilling, MD;  Location: [facility];  Service: Orthopedics;  Laterality: Right;      Assessment & Plan Clinical Impression: Patient is a 82 year old male with history of T2DM, CVA, HFrEF- 40-45%,  CPPD, myelodysplastic syndrome (IgA lambda MGIUS), RA, CAF,  Ramsey Hunt, CPPD/pseudogout,  PAD s/p multiple bypass with recent left great toe amputation, multiple chronic wound RLE with gangrenous changes and critical limb ischemia. He was admitted to [facility] for R-BKA on 03/14/22 by Dr. Bobby Joseph. Post op had issues with fever treated with IS as well as neuropathic pain. Lyrica added to help mange neuropathic pain left foot and Eliquis resumed. Luspatercept injection to be held per Hem/Onc and and Plaquenil/prednisone resumed.  IV narcotics weaned off and H/H noted to be on downward trend to 6.7 on 01/03. He was transfused with one PRBC with improvement in Hgb to 8.8 today. Glipzide has been held during hospitalization. Pain control has improved and PT/OT has been working with patient. He continues to be limited by weakness .   Pt presents with decreased activity tolerance, decreased functional mobility, decreased balance, feelings of stress Limiting pt's independence with leisure/community pursuits.  Met with pt today to discuss TR services including leisure education, activity analysis/modifications and stress management.  Also discussed the importance of social, emotional, spiritual health in addition to physical health and their effects on overall health and wellness.  Pt stated understanding.  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  Pt is motivated to regain further independence and anxious to return to previously enjoyed activities   Plan  No further TR at this time  Recommendations for other services: None   Discharge Criteria: Patient will be discharged from TR if patient refuses treatment 3 consecutive times without medical reason.  If treatment goals not met, if there is a change in medical status, if patient makes no progress towards goals or if patient is discharged from hospital.  The above assessment, treatment plan, treatment alternatives and goals were discussed and mutually agreed upon: by  patient  David Joseph 03/28/2022, 8:33 AM

## 2022-03-28 NOTE — Progress Notes (Signed)
Occupational Therapy Session Note  Patient Details  Name: David Joseph MRN: [id number] Date of Birth: 26-Apr-1940  Today's Date: 03/28/2022 OT Individual Time: 1302-1400 OT Individual Time Calculation (min): 58 min    Short Term Goals: Week 1:  OT Short Term Goal 1 (Week 1): Pt will complete LB dressing with Min A + LRAD. OT Short Term Goal 2 (Week 1): Pt will complete toileting activities with Min A + LRAD. OT Short Term Goal 3 (Week 1): Pt will complete full-body shower with Mod A + LRAD.  Skilled Therapeutic Interventions/Progress Updates:   Pt in bed in sidelying receiving nursing care due to bowel incontinence in bed as pt reports "it came so fast I could not get anyone in here in time." OT relieved nursing for integration of bathing and dressing as well as residual limb protector training. Pt extremely pleasant and appreciative of all training and education provided. Pt was able to don underwear and drawstring athletic pants bed level with bridging and rolling techniques and min A. Able to direct care re: multi-part residual limb protector and refer to directions posted requiring mod-min A overall bed level. Brief rests between all steps due to min fatigue.  Occ min A for flat bed supine to sitting EOB x 3 trials. OT assisted to don L ortho shoe. SPT with mini-hopping bed to [facility] with min A and min cues to ensure joystick and armrest clearance. Once seated pt required OT support to add makeshift R residual limb pad using transfer board with toweling as padding, mod cues to locate controls for reducing speed, pulling in leg rests and navigating to sink side and backing back into bedside space. Oral care with set up. Left pt PWC level awaiting next PT session shortly with chair alarm active, needs, and nurse call button in reach.   Therapy Documentation Precautions:  Precautions Precautions: Fall Precaution Comments: NWB R LE Required Braces or Orthoses:  ((R) Limb guard/ (L) Off-loading  shoe) Restrictions Weight Bearing Restrictions: Yes RLE Weight Bearing: Non weight bearing Other Position/Activity Restrictions: R BKA with limb guard and L off-loading shoe with toe protection.    Therapy/Group: Individual Therapy  Barnabas Lister 03/28/2022, 7:45 AM

## 2022-03-28 NOTE — Progress Notes (Signed)
Physical Therapy Session Note  Patient Details  Name: David Joseph MRN: [id number] Date of Birth: [birth year]  Today's Date: 03/28/2022 PT Individual Time: 0905-1005 and 3443-8146 PT Individual Time Calculation (min): 60 min and 64 min   Short Term Goals: Week 1:  PT Short Term Goal 1 (Week 1): Patient will perform sit/stand with RW and MinA PT Short Term Goal 1 - Progress (Week 1): Met PT Short Term Goal 2 (Week 1): Patient will perform bed/chair transfers with RW and MinA PT Short Term Goal 2 - Progress (Week 1): Met PT Short Term Goal 3 (Week 1): Patient will initiate gait training of distances <50' with RW PT Short Term Goal 3 - Progress (Week 1): Discontinued (comment) (Patient will discharge at the wheelchair level and will not be ambulatory) Week 2:  PT Short Term Goal 1 (Week 2): STG=LTG secondary to ELOS  Skilled Therapeutic Interventions/Progress Updates:  Session 1.  Pt received sitting in WC and agreeable to PT.   WC mobility in power WC through rehab unit 2 x 240ft with supervision assist and cues for safety and while navigating obstacles in hall. PT adjusted joystick control to R side for pt to use dominiate hand, no noted improvement with R vs LUE. Sit<>stand with min assist from WC x 3    Pregrait stepping x 4 with LLE and cues for WB through BUE to allow advancement of sound LE. Pt reports pain in  BIL wrists limit ability to press through hands and advance the LLE  Seated therex. LAQ x 12, hip abduction x 12, hip flexion x 12, hip extension from flexed position x 12, ankle DF on the LLE x 10. High row x 12 lat pull down with band around forearm.  Tricep extension x 10 bicep curl x 10 cues for full ROM, decreased trunkal compensation, improved hold at end range and to decrease speed of movement.   Patient returned to room and left sitting in [facility] with call bell in reach and all needs met.     Sdession 2.   Pt received sitting in WC and agreeable to PT.   WC mobility  through hall, atrium and ove cement sidewalk to simulate community environement. Performed 2x 339ft, 51ft, 742ft  with cues from PT to control joystick with proximal space between thump in index finger, not with finger tips, significantly improved control of WC with adjustment  and joystick adjustment position in hand. Pt able to tolerate controllin speed on indoor setting through obstacles in courtyard at entrance to [facility] PT performed all WC control in elevator  Pt returned to room and performed stand pivot transfer to bed with min assist due to BLE fatigue. PT removed L shoe and pt performed lateral lean to remove pants at EOB. Sit>supine completed with supervision assist and left supine in bed with call bell in reach and all needs met.           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  Vital Signs: Therapy Vitals Temp: (!) 97.5 F (36.4 C) Temp Source: Oral Pulse Rate: 74 Resp: 18 BP: (!) 112/50 Patient Position (if appropriate): Lying Oxygen Therapy SpO2: 98 % O2 Device: Room Air Pain: Pain Assessment Pain Scale: 0-10 Pain Score: 2  Pain Type: Acute pain;Surgical pain Pain Location: Leg Pain Orientation: Right Pain Descriptors / Indicators: Aching;Burning Pain Frequency: Intermittent Pain Onset: With Activity Pain Intervention(s): Medication (See eMAR)  Therapy/Group: Individual Therapy  Golden Pop 03/28/2022, 4:13 PM

## 2022-03-29 DIAGNOSIS — Z89511 Acquired absence of right leg below knee: Secondary | ICD-10-CM | POA: Diagnosis not present

## 2022-03-29 DIAGNOSIS — M7022 Olecranon bursitis, left elbow: Secondary | ICD-10-CM

## 2022-03-29 DIAGNOSIS — I739 Peripheral vascular disease, unspecified: Secondary | ICD-10-CM | POA: Diagnosis not present

## 2022-03-29 DIAGNOSIS — G546 Phantom limb syndrome with pain: Secondary | ICD-10-CM | POA: Diagnosis not present

## 2022-03-29 DIAGNOSIS — E1142 Type 2 diabetes mellitus with diabetic polyneuropathy: Secondary | ICD-10-CM | POA: Diagnosis not present

## 2022-03-29 LAB — GLUCOSE, CAPILLARY
Glucose-Capillary: 99 mg/dL (ref 70–99)
Glucose-Capillary: 183 mg/dL — ABNORMAL HIGH (ref 70–99)
Glucose-Capillary: 182 mg/dL — ABNORMAL HIGH (ref 70–99)
Glucose-Capillary: 109 mg/dL — ABNORMAL HIGH (ref 70–99)

## 2022-03-29 MED ORDER — PREGABALIN 50 MG PO CAPS
100.0000 mg | ORAL_CAPSULE | Freq: Three times a day (TID) | ORAL | Status: DC
  Administered 2022-03-29 – 2022-04-08 (×29): 100 mg via ORAL
  Filled 2022-03-29 (×29): qty 2

## 2022-03-29 MED ORDER — DICLOFENAC SODIUM 1 % EX GEL: 4.0000 g | Freq: Four times a day (QID) | CUTANEOUS | Status: DC | PRN

## 2022-03-29 NOTE — Progress Notes (Signed)
Occupational Therapy Session Note  Patient Details  Name: David Joseph MRN: [id number] Date of Birth: Mar 27, 1940  Today's Date: 03/29/2022 OT Individual Time: 1001-1059 OT Individual Time Calculation (min): 58 min    Short Term Goals: Week 1:  OT Short Term Goal 1 (Week 1): Pt will complete LB dressing with Min A + LRAD. OT Short Term Goal 2 (Week 1): Pt will complete toileting activities with Min A + LRAD. OT Short Term Goal 3 (Week 1): Pt will complete full-body shower with Mod A + LRAD.  Skilled Therapeutic Interventions/Progress Updates: Patient seen for education on techniques to increase independence with pericare and toileting tasks as well as education for patient and wife on follow up care, staple removal and the steps working towards getting a prosthetic. Patient concerned that his current LLE weakness would make it unsafe for him to perform pericare while standing at the walker so discussed and practiced techniques for forward and side lean to allow patient to perform pericare sitting. Patient with good reach if toilet seat size allows, encouraged to scoot forward on the seat to better reach. Followed with patient's wife expressing concern with staple removal as the notes say return to surgeon for removal. Patient's wife has called, but also educated patient that often when patient's do not go directly home surgeons will have instructions to remove staples. Continued treatment with a brief introduction to the importance of skin checks both as his residual limb heals but once he received and begins using his prosthetic. Continued treament with UE strength and endurance exercises at patient's request. Patient wanting to build UE muscle to improve ability to stand at a walker and perform ADL's on his own. Used 3lb down for proximal strengthening performing sets of 12. Patient having wrist pain resulting in transition from using dowel to cuff weights to take strain of B wrists. 4lb cuff  weight on forearm tolerated well. Continue with skilled OT POC.     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 General:   Vital Signs:  Pain: Pain Assessment Pain Scale: 0-10 Pain Score: 4  Pain Type: Phantom pain Pain Location: Leg Pain Orientation: Right Pain Descriptors / Indicators: Aching Pain Frequency: Intermittent Pain Onset: With Activity Patients Stated Pain Goal: 2 Pain Intervention(s): Medication (See eMAR) Multiple Pain Sites: No   Therapy/Group: Individual Therapy  Warnell Forester 03/29/2022, 12:23 PM

## 2022-03-29 NOTE — Progress Notes (Addendum)
Patient ID: David Joseph, male   DOB: 05-Mar-1941, 82 y.o.   MRN: [id number]  Met with wife who is here and reports her contractor will have the ramp done 1/22 and their son is coming from [place] and wants to help home. Both want to move discharge date to 1/22. Have messaged team & MD regarding this await responses.  11:00 AM Team and MD on board with extending until 1/22. Wife and pt very appreciative of this. Wife has set up with PT education for next week.

## 2022-03-29 NOTE — Progress Notes (Signed)
Physical Therapy Session Note  Patient Details  Name: LARREN COPES MRN: [id number] Date of Birth: 1940/04/07  Today's Date: 03/29/2022 PT Individual Time: 0910-1004 PT Individual Time Calculation (min): 54 min   Short Term Goals: Week 2:  PT Short Term Goal 1 (Week 2): STG=LTG secondary to ELOS  Skilled Therapeutic Interventions/Progress Updates: Pt presented in bed attempting to urinate agreeable to therapy. Pt able to sit EOB and complete urinary void mod I. PTA changed shrinker total A and donned clean shrinker total A. Pt required minA for threading pants and was able to perform rolling L/R with supervision to pull pants over hips. PTA donned limb guard and support strap total A with wife entering room and observing how to don straps. Discussed with wife setting up family education next week with times coordinated and set up in calendar. Pt then performed supine to sit with distant supervision and performed Sit to stand from slightly elevated bed with CGA to transfer to PWC. Pt performed stand pivot to PWC with minA. Pt noted to shuffle vs attempted to hop, however attempted to sit prior to being close enough to PWC. Once in chair pt able to scoot posteriorly in PWC with supervision. Pt set up with Slide board for residual limb support. Pt was able to safely navigate PWC in room and avoid any obstacles. Pt then navigated PWC in 4W unit safely avoiding obstacles and safely. In day room PTA discussed importance of pressure relief and repositioning with PTA demonstrating how to use tilt and recliner feature. Pt will continue to benefit from education regarding pressure relief. Pt then participated in seated residual limb therex including LAQ, hip abd/add, and SLR 2 x 10 Pt then handed off to Plum, OT to continuation of therapies with current needs met.      Therapy Documentation Precautions:  Precautions Precautions: Fall Precaution Comments: NWB R LE Required Braces or Orthoses:  ((R) Limb  guard/ (L) Off-loading shoe) Restrictions Weight Bearing Restrictions: Yes RLE Weight Bearing: Non weight bearing Other Position/Activity Restrictions: R BKA with limb guard and L off-loading shoe with toe protection. General:   Vital Signs: Therapy Vitals Temp: 98.2 F (36.8 C) Pulse Rate: 70 Resp: 18 BP: (!) 103/42 Patient Position (if appropriate): Lying Oxygen Therapy SpO2: 96 % O2 Device: Room Air Pain:   Mobility:   Locomotion :    Trunk/Postural Assessment :    Balance:   Exercises:   Other Treatments:      Therapy/Group: Individual Therapy  Willadene Mounsey 03/29/2022, 4:37 PM

## 2022-03-29 NOTE — Progress Notes (Signed)
Occupational Therapy Weekly Progress Note  Patient Details  Name: David Joseph MRN: [id number] Date of Birth: Jul 20, 1940  Beginning of progress report period: Today's Date: 03/29/2022 OT Individual Time: 1001-1059 OT Individual Time Calculation (min): 58 min  Start of progress report period: March 22, 2022 End of progress report period: March 29, 2022  Today's Date: 03/29/2022 OT Individual Time: 8756-4332 OT Individual Time Calculation (min): 73 min    Patient has met 3 of 3 short term goals.  Pt making steady gains in OT with new STG's reflective of LTG's set. Pt able to access shower with wounds covered and RW for SPT. Pt now using PWC and learning to navigate within bedroom and bathroom spaces for toilet, shower and sink side activity. Pt new to use therefore requires min a and cues for certain transfer set up and obstacle negotiation as well as features. Pt becomes fatigued as tasks and mobility become more taxing as shower training performed late in day this visit and pt required modified transfers and techniques. OT educating on skin integrity, amputee devices, DME/AE, falls prev, energy conservation and functional balance and strength as well with receptivity and eagerness. Pt also is limited by distal UE pain and arthritic deformity as well as L intact limb wound.   Patient continues to demonstrate the following deficits: muscle weakness, decreased cardiorespiratoy endurance, unbalanced muscle activation, decreased coordination, and decreased motor planning, and decreased sitting balance, decreased standing balance, decreased postural control, and decreased balance strategies and therefore will continue to benefit from skilled OT intervention to enhance overall performance with BADL, iADL, Vocation, and Reduce care partner burden.  Patient progressing toward long term goals..  Continue plan of care.  OT Short Term Goals Week 1:  OT Short Term Goal 1 (Week 1): Pt will complete LB  dressing with Min A + LRAD. OT Short Term Goal 1 - Progress (Week 1): Met OT Short Term Goal 2 (Week 1): Pt will complete toileting activities with Min A + LRAD. OT Short Term Goal 2 - Progress (Week 1): Met OT Short Term Goal 3 (Week 1): Pt will complete full-body shower with Mod A + LRAD. OT Short Term Goal 3 - Progress (Week 1): Met Week 2:  OT Short Term Goal 1 (Week 2): STG=LTG's d/t LOS  Skilled Therapeutic Interventions/Progress Updates:  Pt seen for skilled OT session with weekly note update by offering full shower and self care retraining session. Pt eager and up in [facility] ready for visit. No oain reported only mild fatigue. OT provided cues and CGA for all power mobility in room and min A in close bathroom areas. Pt used toilet initially with commode topper via SPT with min A. Pt able to doff pull off LB garments including limb protector with CGA. OT provided waterproofing to R limb and L foot. Transfer from [facility] to TTB with CGA. Shower seated level with lateral leans for buttocks with min A overall. Pt then able to dress at edge of shower bench with min a LB and S for UB pull on clothing. Use of reacher for ortho shoe with min A. Pt stood overall up to 1 min which diminished to 30 sec max later in session due to fatigue and end of the day session. Pt requested back to bed and drove PWC to bedside with close s and transferred via squat pivot laterally with close S. Left pt bed level with bed alarm active, nurse call button and needs in reach.    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    Therapy/Group: Individual Therapy  Vicenta Dunning 03/29/2022, 12:50 PM

## 2022-03-29 NOTE — Progress Notes (Signed)
PROGRESS NOTE   Subjective/Complaints: Reports he had nausea early this AM but it has resolved.  Foam dressing fell off left elbow and he would like this replaced.   ROS: Hearing loss-chronic, neck pain, R face numbness, constipation, denies CP or SOB, vision changes, new rash + phantom pain +nausea-resolved +scabs in L toes  Objective:   No results found. No results for input(s): "WBC", "HGB", "HCT", "PLT" in the last 72 hours.  No results for input(s): "NA", "K", "CL", "CO2", "GLUCOSE", "BUN", "CREATININE", "CALCIUM" in the last 72 hours.   Intake/Output Summary (Last 24 hours) at 03/29/2022 1159 Last data filed at 03/29/2022 0717 Gross per 24 hour  Intake 240 ml  Output 1250 ml  Net -1010 ml         Physical Exam: Vital Signs Blood pressure (!) 95/52, pulse 66, temperature 98 F (36.7 C), resp. rate 16, height '5\' 8"'  (1.727 m), weight 61 kg, SpO2 100 %.  Gen: no distress, normal appearing, in bed HEENT: oral mucosa pink and moist, NCAT, conjugate gaze Cardio: RRR Chest: CTAB, normal effort, normal rate of breathing Abd: soft, non-distended, +BS Ext: no edema, limb guard in place RLE Psych: Pt's affect is appropriate. Pt is cooperative Skin: warm and dry   Neuro:   Mental Status: He is oriented to person, place, and time. Follows commands, makes eye contact    Comments: Decreased to light touch from mid thighs down B/L  Musculoskeletal:     Cervical back: Neck supple. No tenderness.     Comments: UE strength 4+/5 B/L R HF and KE 4/5- limited by pain LLE_ HF 3-/5; KE 3+/5; DF 4/5; PF 4-/5  L olecranon bursitis- no skin breakdown, replaced foam dressing Scabs on L toes- unchanged from yesterday              Assessment/Plan: 1. Functional deficits which require 3+ hours per day of interdisciplinary therapy in a comprehensive inpatient rehab setting. Physiatrist is providing close team supervision  and 24 hour management of active medical problems listed below. Physiatrist and rehab team continue to assess barriers to discharge/monitor patient progress toward functional and medical goals  Care Tool:  Bathing  Bathing activity did not occur:  (Pt choosing to do simple spongebath due to increased fatigue.) Body parts bathed by patient: Front perineal area, Face   Body parts bathed by helper: Buttocks     Bathing assist Assist Level: Minimal Assistance - Patient > 75%     Upper Body Dressing/Undressing Upper body dressing   What is the patient wearing?: Pull over shirt    Upper body assist Assist Level: Set up assist    Lower Body Dressing/Undressing Lower body dressing      What is the patient wearing?: Underwear/pull up, Pants     Lower body assist Assist for lower body dressing: Moderate Assistance - Patient 50 - 74%     Toileting Toileting Toileting Activity did not occur (Clothing management and hygiene only): N/A (no void or bm)  Toileting assist Assist for toileting: Dependent - Patient 0%     Transfers Chair/bed transfer  Transfers assist     Chair/bed transfer assist level: Moderate Assistance -  Patient 50 - 74%     Locomotion Ambulation   Ambulation assist   Ambulation activity did not occur: Safety/medical concerns (Unable to assess gait or stair mobility at this time secondary to recent R BKA with significant weakness and poor endurance/standing tolerance)          Walk 10 feet activity   Assist  Walk 10 feet activity did not occur: Safety/medical concerns        Walk 50 feet activity   Assist Walk 50 feet with 2 turns activity did not occur: Safety/medical concerns         Walk 150 feet activity   Assist Walk 150 feet activity did not occur: Safety/medical concerns         Walk 10 feet on uneven surface  activity   Assist Walk 10 feet on uneven surfaces activity did not occur: Safety/medical concerns          Wheelchair     Assist Is the patient using a wheelchair?: Yes Type of Wheelchair: Manual    Wheelchair assist level: Supervision/Verbal cueing Max wheelchair distance: 150'    Wheelchair 50 feet with 2 turns activity    Assist        Assist Level: Supervision/Verbal cueing   Wheelchair 150 feet activity     Assist      Assist Level: Supervision/Verbal cueing   Blood pressure (!) 95/52, pulse 66, temperature 98 F (36.7 C), resp. rate 16, height '5\' 8"'  (1.727 m), weight 61 kg, SpO2 100 %.   Medical Problem List and Plan: 1. 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  Antithrombotics: -DVT/anticoagulation:  Pharmaceutical: Eliquis             -antiplatelet therapy: N/A3  3. Pain Management:  oxycodone prn.              --Lyrica ITD-->75 mg, 50 mg 75 mg. Might benefit from increasing Lyrica due to nerve pain on B/L LE's AND phantom pain   Pt reports nerve pain and phantom pain under control, will continue current medications and monitor   -1/8 Increase lyrica to 551m TID  -1/9 reports pain controlled continue to monitor  -1/11 Increase lyrica to 1071mTID  Monitor response to increase lyrica 4. Insomnia: continue trazodone prn 5. Neuropsych/cognition: This patient is capable of making decisions on his own behalf. 6. Skin/Wound Care: Routine pressure relief measures.             --monitor incision for healing.             --protein supplement to promote wound healing.  7. Fluids/Electrolytes/Nutrition: Monitor I/O. Check CMET in am             --add protein supplement.  8. 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  Home medication is glipizide 2.51m7maily             --will  check A1C in am.              --continue to hold glipizide and resume if elevated.   -1/6 CBGs stable, continue to monitor trend  1/7: glipizide 2.47m resumed  1/8 monitor for response to medication change  Few elevated CBGs, continue to monitor trend for now  CBG (last 3)  Recent Labs    03/28/22 2137 03/29/22 0627 03/29/22 1132  GLUCAP 227* 99 183*     9. Afib w/RVR: Diagnosed 11/2021 s/p ablation 02/12/22 by Dr. ACandis Musa  --Monitor HR TID.               --Continue metoprolol and Eliquis. -in NSR  10. Persistent hyponatremia:Recheck Na level in am.   -1/5 Salt tabs started, recheck BMP tomorrow  1/8 Na improved to 1[place]11. IgA Lambda MGUS w/7% sideroblasts : Thrombocytopenia stable.   --was getting transfusion PTA due to illness.  --monitor for any signs of bleeding.  12. Acute on chronic anemia: Improved post transfusion w/one unit PRBC up to 8.8 from 6.7 yesterday.              -1/8 HGB at 8.5, continue to monitor 13. Congestive HFrEF: Heart healthy diet. Continue Lipitor and metoprolol. Daily weight. 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  16. Loose stools- have stopped pt's scheduled bowel meds- isn't on ABX- might Benefit from fiber if things don't improve.  -1/5 reports improved, continue to monitor 17. Hypotensive: will maintain lopressor given afib, continue to monitor BP 9/10 Soft but stable, continue lopressor    03/29/2022    4:20 AM 03/28/2022    7:35 PM 03/28/2022    1:19 PM   Vitals with BMI  Systolic 95 [id number] Diastolic 52 40 50  Pulse 66 70 74    18. Hypokalemia  -1/8 Kdur 457m today, recheck tomorrow  -1/9 K+ improved to 3.6 19. L foot scabs.  Hx of PVD -Keep dry open to air. F/u with vascular after discharge.  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

## 2022-03-30 LAB — GLUCOSE, CAPILLARY
Glucose-Capillary: 92 mg/dL (ref 70–99)
Glucose-Capillary: 123 mg/dL — ABNORMAL HIGH (ref 70–99)
Glucose-Capillary: 291 mg/dL — ABNORMAL HIGH (ref 70–99)
Glucose-Capillary: 151 mg/dL — ABNORMAL HIGH (ref 70–99)

## 2022-03-30 NOTE — Progress Notes (Signed)
Occupational Therapy Session Note  Patient Details  Name: David Joseph MRN: [id number] Date of Birth: 20-Sep-1940  Today's Date: 03/30/2022 OT Individual Time: 7241-9542 OT Individual Time Calculation (min): 42 min    Short Term Goals: Week 2:  OT Short Term Goal 1 (Week 2): STG=LTG's d/t LOS  Skilled Therapeutic Interventions/Progress Updates:  Skilled OT intervention completed with focus on pain management and associate modifications, functional transfers, sit > stands and unilateral UE support in stance to promote higher level dynamic balance needed for LB self-care. Pt received upright in bed, agreeable to session. Un-rated pain reported in bilateral wrists/hands; pre-medicated. Pt indicated he was having a flare up of arthritis in his hands today, with request to modify tasks, but also stating "I want to exercise more, not just do riding up and down the hall, I can do that without help." OT offered rest breaks, modifications and repositioning throughout for pain reduction.  Pt agreeable to utilize slide board for transfer to [facility] to conserve energy for standing tasks in gym. Able to transition to EOB with supervision, limb guard on R BKA and L off load shoe already donned. OT positioned PWC in prep for transfer, max A placement of board under L hip, with supervision for leaning onto R elbow, then CGA slide board transfer to [facility] with cues for hand placement and preventing tucking fingers under board.  Pt was able to steer and navigate in [facility] with supervision with cues needed only for tight spaces when on high speed setting. Lowered the speed in prep for hallway navigation from room<> gym with no cues needed for safety other than directional cues.  Pt completed series of 5 sit > stands using RW during bean bag toss into bucket task. Min A needed fading to CGA for powering up, then CGA for static standing balance however increased to min A for dynamic balance when tossing bean bags on LUE and  Mod A for tossing on RUE. Cues needed for quad activation and knee extension with onset of fatigue. Intermittent rest breaks needed especially with R hand off the RW. Educated pt on purpose of activity and importance to use alternating hands for LB self-care.  Back in room, min A sit > stand using RW, then min A stand pivot with pt hopping but only able to do "3 hops and then it stops." With min A needed to safely position hips onto EOB. Supervision bed mobility to upright in bed with NT present at direct care handoff.   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    Therapy/Group: Individual Therapy  Melvyn Novas, MS, OTR/L  03/30/2022, 12:11 PM

## 2022-03-30 NOTE — Progress Notes (Signed)
Physical Therapy Session Note  Patient Details  Name: David Joseph MRN: [id number] Date of Birth: 04/08/1940  Today's Date: 03/30/2022 PT Individual Time: 9639-0228 PT Individual Time Calculation (min): 40 min   Short Term Goals: Week 2:  PT Short Term Goal 1 (Week 2): STG=LTG secondary to ELOS  Skilled Therapeutic Interventions/Progress Updates:    Chart reviewed and pt agreeable to therapy. Pt received semi-reclined in bed with no c/o pain. Session focused on bed mobility for transfers and self-care, functional transfers out of bed, and wheelchair management to promote independence in the home. Pt initiated session with supervision for rolling and supine to sit during donning of limb guard. Pt required minA for donning of limb guard and lower body dressing in bed. Pt then completed 1x5 sit to stand at EOB with MinA progressing to CGA with VC for technique and sequencing. Pt was able to hold standing position for 30-60secs each trial. Pt then  completed SST to PWC using CGA + RW. Pt navigated PWC in hallways for 7 mins with supervision. At end of session, pt was left seated in PWC with alarm engaged, nurse call bell and all needs in reach.     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     Therapy/Group: Individual Therapy  Dionne Milo, PT, DPT 03/30/2022, 10:19 AM

## 2022-03-31 DIAGNOSIS — S88111D Complete traumatic amputation at level between knee and ankle, right lower leg, subsequent encounter: Secondary | ICD-10-CM | POA: Diagnosis not present

## 2022-03-31 LAB — GLUCOSE, CAPILLARY
Glucose-Capillary: 101 mg/dL — ABNORMAL HIGH (ref 70–99)
Glucose-Capillary: 170 mg/dL — ABNORMAL HIGH (ref 70–99)
Glucose-Capillary: 210 mg/dL — ABNORMAL HIGH (ref 70–99)
Glucose-Capillary: 130 mg/dL — ABNORMAL HIGH (ref 70–99)

## 2022-03-31 NOTE — Progress Notes (Signed)
PROGRESS NOTE   Subjective/Complaints: Slept well last night, pain doing ok-- some phantom pain but tolerable. LBM yesterday. Denies any other complaints or concerns this morning.   ROS: +Hearing loss-chronic, neck pain, R face numbness, constipation, denies CP or SOB, abd pain, n/v/d/c, vision changes, new rash + phantom pain +scabs in L toes  Objective:   No results found. No results for input(s): "WBC", "HGB", "HCT", "PLT" in the last 72 hours.  No results for input(s): "NA", "K", "CL", "CO2", "GLUCOSE", "BUN", "CREATININE", "CALCIUM" in the last 72 hours.   Intake/Output Summary (Last 24 hours) at 03/31/2022 0818 Last data filed at 03/31/2022 0756 Gross per 24 hour  Intake 1077 ml  Output 1500 ml  Net -423 ml         Physical Exam: Vital Signs Blood pressure (!) 107/48, pulse 62, temperature 98 F (36.7 C), resp. rate 16, height '5\' 8"'  (1.727 m), weight 61 kg, SpO2 99 %.  Gen: no distress, normal appearing, in w/c HEENT: oral mucosa pink and moist, NCAT, conjugate gaze Cardio: RRR Pulm: CTAB, normal effort, normal rate of breathing Abd: soft, non-distended, +BS Ext: trace LLE edema mid-calf to ankle Psych: Pt's affect is appropriate. Pt is cooperative Skin: warm and dry   Neuro:   Mental Status: He is oriented to person, place, and time. Follows commands, makes eye contact    Comments: Decreased to light touch from mid thighs down B/L  Musculoskeletal:     Cervical back: Neck supple. No tenderness.     Comments: UE strength 4+/5 B/L R HF and KE 4/5- limited by pain LLE_ HF 3-/5; KE 3+/5; DF 4/5; PF 4-/5  L olecranon bursitis- no skin breakdown, replaced foam dressing Scabs on L toes- dressed and not uncovered during exam today              Assessment/Plan: 1. Functional deficits which require 3+ hours per day of interdisciplinary therapy in a comprehensive inpatient rehab setting. Physiatrist  is providing close team supervision and 24 hour management of active medical problems listed below. Physiatrist and rehab team continue to assess barriers to discharge/monitor patient progress toward functional and medical goals  Care Tool:  Bathing  Bathing activity did not occur:  (Pt choosing to do simple spongebath due to increased fatigue.) Body parts bathed by patient: Right arm, Left arm, Chest, Abdomen, Front perineal area, Right upper leg, Left upper leg, Face   Body parts bathed by helper: Buttocks Body parts n/a: Right lower leg, Left lower leg   Bathing assist Assist Level: Minimal Assistance - Patient > 75%     Upper Body Dressing/Undressing Upper body dressing   What is the patient wearing?: Pull over shirt    Upper body assist Assist Level: Set up assist    Lower Body Dressing/Undressing Lower body dressing      What is the patient wearing?: Underwear/pull up, Pants     Lower body assist Assist for lower body dressing: Minimal Assistance - Patient > 75%     Toileting Toileting Toileting Activity did not occur (Clothing management and hygiene only): N/A (no void or bm)  Toileting assist Assist for toileting: Minimal Assistance - Patient >  75%     Transfers Chair/bed transfer  Transfers assist     Chair/bed transfer assist level: Moderate Assistance - Patient 50 - 74%     Locomotion Ambulation   Ambulation assist   Ambulation activity did not occur: Safety/medical concerns (Unable to assess gait or stair mobility at this time secondary to recent R BKA with significant weakness and poor endurance/standing tolerance)          Walk 10 feet activity   Assist  Walk 10 feet activity did not occur: Safety/medical concerns        Walk 50 feet activity   Assist Walk 50 feet with 2 turns activity did not occur: Safety/medical concerns         Walk 150 feet activity   Assist Walk 150 feet activity did not occur: Safety/medical  concerns         Walk 10 feet on uneven surface  activity   Assist Walk 10 feet on uneven surfaces activity did not occur: Safety/medical concerns         Wheelchair     Assist Is the patient using a wheelchair?: Yes Type of Wheelchair: Manual    Wheelchair assist level: Supervision/Verbal cueing Max wheelchair distance: 150'    Wheelchair 50 feet with 2 turns activity    Assist        Assist Level: Supervision/Verbal cueing   Wheelchair 150 feet activity     Assist      Assist Level: Supervision/Verbal cueing   Blood pressure (!) 107/48, pulse 62, temperature 98 F (36.7 C), resp. rate 16, height '5\' 8"'  (1.727 m), weight 61 kg, SpO2 99 %.   Medical Problem List and Plan: 1. 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  Antithrombotics: -DVT/anticoagulation:  Pharmaceutical: Eliquis 90m BID             -antiplatelet therapy: N/A 3. Pain Management:  Oxycodone prn. Voltaren 4g QID PRN. Robaxin 5056mq6h PRN. Tramadol 5028m6h PRN. -Lyrica TID-->75 mg, 50 mg 75 mg. Might benefit from increasing Lyrica due to nerve pain on B/L LE's AND phantom pain  -Pt reports nerve pain and phantom pain under control, will continue current medications and monitor   -1/8 Increase lyrica to 10m68mD  -1/9 reports pain controlled continue to monitor  -1/11 Increase lyrica to 100mg48m  -03/31/22 pain doing well, continue regimen 4. Insomnia: continue trazodone prn 5. Neuropsych/cognition: This patient is capable of making decisions on his own behalf. 6. Skin/Wound Care: Routine pressure relief  measures.             -monitor incision for healing.             -protein supplement to promote wound healing.  7. Fluids/Electrolytes/Nutrition: Monitor I/O. F/up weekly labs (next 04/01/22)             -add protein supplement.  8. 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  Home medication is glipizide 2.38m daily             -will check A1C in am.              -continue to hold glipizide and resume if elevated.   -1/6 CBGs stable, continue to monitor trend  -1/7: glipizide 2.582mresumed  -1/8 monitor for response to medication change  -03/31/22 still with variable CBGs, monitor for now  CBG (last 3)  Recent Labs    03/30/22 1654 03/30/22 2035 03/31/22 0549  GLUCAP 291* 151* 101*     9. Afib w/RVR: Diagnosed 11/2021 s/p ablation 02/12/22 by Dr. AsCandis Musa -Monitor HR TID.              -Continue metoprolol 2531mID and Eliquis. -03/31/22 HR stable, continue to monitor  10. Persistent hyponatremia: Recheck Na level in am.   -1/5 Salt tabs started, recheck BMP tomorrow  -1/8 Na improved to 134   -Recheck Monday 04/01/22 11. IgA Lambda MGUS w/7% sideroblasts : Thrombocytopenia stable.   -was getting transfusion PTA due to illness.  -monitor for any signs of bleeding.  12. Acute on chronic anemia: Improved post transfusion w/one unit PRBC up to 8.8 from 6.7 yesterday.              -1/8 HGB at 8.5, continue to monitor on weekly labs next 04/01/22 13. Congestive HFrEF: Heart healthy diet. Continue Lipitor 51m44m and metoprolol XR 25mg85m. Daily weight. 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  16. Loose stools- have stopped pt's scheduled bowel meds- isn't on ABX- might Benefit from fiber if things don't improve.  -1/5 reports improved, continue to monitor -03/31/22 adequate bowel regimen, continue and monitor 17. Hypotensive: will maintain lopressor given afib, continue to monitor BP -03/31/22 BP still relatively stable, continue lopressor and monitor    03/31/2022    5:47 AM 03/30/2022    8:34 PM 03/30/2022    1:29 PM  Vitals with BMI  Systolic 107 [id number] 48 45 50  Pulse 62 71 73    18. Hypokalemia  -1/8 Kdur 40meq t70m, recheck tomorrow  -1/9 K+ improved to 3.6  -Monitor on weekly labs, next 04/01/22 19. L foot scabs.  Hx of PVD -Keep dry open to air. F/u with vascular after discharge.  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 L olecranon bursitis  -Replaced foam dressing, voltaren prn     LOS: 10 days A FACE TO FACE [id number], 8:18 AM

## 2022-03-31 NOTE — Progress Notes (Signed)
Occupational Therapy Session Note  Patient Details  Name: David Joseph MRN: [id number] Date of Birth: Aug 21, 1940  {CHL IP REHAB OT TIME CALCULATIONS:[id number]}   Short Term Goals: Week 2:  OT Short Term Goal 1 (Week 2): STG=LTG's d/t LOS  Skilled Therapeutic Interventions/Progress Updates:  Pt received *** for skilled OT session with focus on ***. Pt agreeable to interventions, demonstrating overall *** mood. Pt reported ***/10 pain, stating "***" in reference to ***. OT offering intermediate rest breaks and positioning suggestions throughout session to address pain/fatigue and maximize participation/safety in session.    Pt remained *** with all immediate needs met at end of session. Pt continues to be appropriate for skilled OT intervention to promote further functional independence.   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    Therapy/Group: Individual Therapy  Lou Cal, OTR/L, MSOT  03/31/2022, 12:56 PM

## 2022-03-31 NOTE — Progress Notes (Signed)
Slept good. PRN oxy IR 10mg 's given at 2042 for complaint of LLE pain. Rated pain a 4 on pain scale. Multi scabbed areas to 2nd & 3rd toe on left foot-OTA. Pitting edema noted to LLE, edema greater in left foot. Foam dressing to left heel, heel with minimal, blanchable redness, no breakdown. Removed stump shrinker from right BKA. Incision with staples, OTA, bruising and redness observed. Applied new stump shrinker. Requested assistance manipulating pajama bottoms to use urinal. Reports arthritis makes it hard to do. LBM 01/13. 2/13 A

## 2022-04-01 DIAGNOSIS — G546 Phantom limb syndrome with pain: Secondary | ICD-10-CM | POA: Diagnosis not present

## 2022-04-01 DIAGNOSIS — E871 Hypo-osmolality and hyponatremia: Secondary | ICD-10-CM | POA: Diagnosis not present

## 2022-04-01 DIAGNOSIS — E1142 Type 2 diabetes mellitus with diabetic polyneuropathy: Secondary | ICD-10-CM | POA: Diagnosis not present

## 2022-04-01 DIAGNOSIS — D62 Acute posthemorrhagic anemia: Secondary | ICD-10-CM

## 2022-04-01 DIAGNOSIS — Z89511 Acquired absence of right leg below knee: Secondary | ICD-10-CM | POA: Diagnosis not present

## 2022-04-01 LAB — CBC
HCT: 25.7 % — ABNORMAL LOW (ref 39.0–52.0)
Hemoglobin: 8.6 g/dL — ABNORMAL LOW (ref 13.0–17.0)
MCH: 36 pg — ABNORMAL HIGH (ref 26.0–34.0)
MCHC: 33.5 g/dL (ref 30.0–36.0)
MCV: 107.5 fL — ABNORMAL HIGH (ref 80.0–100.0)
Platelets: 149 10*3/uL — ABNORMAL LOW (ref 150–400)
RBC: 2.39 MIL/uL — ABNORMAL LOW (ref 4.22–5.81)
RDW: 23.1 % — ABNORMAL HIGH (ref 11.5–15.5)
WBC: 4 10*3/uL (ref 4.0–10.5)
nRBC: 0 % (ref 0.0–0.2)

## 2022-04-01 LAB — BASIC METABOLIC PANEL
Anion gap: 7 (ref 5–15)
BUN: 16 mg/dL (ref 8–23)
CO2: 28 mmol/L (ref 22–32)
Calcium: 8.2 mg/dL — ABNORMAL LOW (ref 8.9–10.3)
Chloride: 103 mmol/L (ref 98–111)
Creatinine, Ser: 0.73 mg/dL (ref 0.61–1.24)
GFR, Estimated: >60 mL/min (ref >60)
Glucose, Bld: 91 mg/dL (ref 70–99)
Potassium: 3.4 mmol/L — ABNORMAL LOW (ref 3.5–5.1)
Sodium: 138 mmol/L (ref 135–145)

## 2022-04-01 LAB — GLUCOSE, CAPILLARY
Glucose-Capillary: 86 mg/dL (ref 70–99)
Glucose-Capillary: 173 mg/dL — ABNORMAL HIGH (ref 70–99)
Glucose-Capillary: 242 mg/dL — ABNORMAL HIGH (ref 70–99)
Glucose-Capillary: 97 mg/dL (ref 70–99)

## 2022-04-01 MED ORDER — POTASSIUM CHLORIDE CRYS ER 20 MEQ PO TBCR
30.0000 meq | EXTENDED_RELEASE_TABLET | Freq: Once | ORAL | Status: AC
  Administered 2022-04-01: 30 meq via ORAL
  Filled 2022-04-01: qty 1

## 2022-04-01 MED ORDER — POTASSIUM CHLORIDE CRYS ER 10 MEQ PO TBCR
10.0000 meq | EXTENDED_RELEASE_TABLET | ORAL | Status: DC
  Administered 2022-04-02: 10 meq via ORAL
  Filled 2022-04-01: qty 1

## 2022-04-01 NOTE — Plan of Care (Signed)
  Problem: Consults Goal: RH LIMB LOSS PATIENT EDUCATION Description: Description: See Patient Education module for eduction specifics. Outcome: Progressing   Problem: RH SKIN INTEGRITY Goal: RH STG SKIN FREE OF INFECTION/BREAKDOWN Description: W min assist Outcome: Progressing Goal: RH STG ABLE TO PERFORM INCISION/WOUND CARE W/ASSISTANCE Description: STG Able To Perform Incision/Wound Care With Assistance. Outcome: Progressing   Problem: RH SAFETY Goal: RH STG ADHERE TO SAFETY PRECAUTIONS W/ASSISTANCE/DEVICE Description: STG Adhere to Safety Precautions With cues Assistance/Device. Outcome: Progressing   Problem: RH PAIN MANAGEMENT Goal: RH STG PAIN MANAGED AT OR BELOW PT'S PAIN GOAL Description: < 4 with prns Outcome: Progressing   Problem: RH KNOWLEDGE DEFICIT LIMB LOSS Goal: RH STG INCREASE KNOWLEDGE OF SELF CARE AFTER LIMB LOSS Outcome: Progressing   Problem: Education: Goal: Ability to describe self-care measures that may prevent or decrease complications (Diabetes Survival Skills Education) will improve Outcome: Progressing Goal: Individualized Educational Video(s) Outcome: Progressing   Problem: Coping: Goal: Ability to adjust to condition or change in health will improve Outcome: Progressing   Problem: Fluid Volume: Goal: Ability to maintain a balanced intake and output will improve Outcome: Progressing   Problem: Health Behavior/Discharge Planning: Goal: Ability to identify and utilize available resources and services will improve Outcome: Progressing Goal: Ability to manage health-related needs will improve Outcome: Progressing   Problem: Metabolic: Goal: Ability to maintain appropriate glucose levels will improve Outcome: Progressing   Problem: Nutritional: Goal: Maintenance of adequate nutrition will improve Outcome: Progressing Goal: Progress toward achieving an optimal weight will improve Outcome: Progressing   Problem: Skin Integrity: Goal:  Risk for impaired skin integrity will decrease Outcome: Progressing   Problem: Tissue Perfusion: Goal: Adequacy of tissue perfusion will improve Outcome: Progressing

## 2022-04-01 NOTE — Progress Notes (Signed)
Occupational Therapy Session Note  Patient Details  Name: David Joseph MRN: [id number] Date of Birth: Aug 14, 1940  Today's Date: 04/01/2022 OT Individual Time: 1352-1447 OT Individual Time Calculation (min): 55 min    Short Term Goals: Week 1:  OT Short Term Goal 1 (Week 2): STG=LTG's d/t LOS  Skilled Therapeutic Interventions/Progress Updates:    Patient agreeable to participate in OT session. Reports 0/10 pain level.   Patient participated in skilled OT session focusing on functional transfers, toileting, and UE HEP. Pt on BSC upon therapy arrival. OT provided set-up of wash clothes while pt completed toilet hygiene. Due to [facility], therapist did assist to ensure cleanliness. Pt was able to perform tricep push up using arm rests of BSC to allow for continued hygiene and pants management.  Pt completed functional transfer from [facility] to bed with CGA using RW. Pt donned limb protector in bed. Minor adjustments were made to ensure that limb protector was tight enough. Utilized RW once more to perform stand/hop pvt transfer from bed to powered wheelchair. Pt then completed wheelchair management while navigating from room to day room.    Therapist educated pt on BUE HEP in order to improve overall strength and endurance needed to complete functional transfers safe and effectively.  Strengthening exercises: - Wheelchair pushups: 5X seated in powered wheelchair.  - Red theraband, shoulder, horizontal abduction/adduction, flexion, abduction/adduction, IR/er, 10X   Therapy Documentation Precautions:  Precautions Precautions: Fall Precaution Comments: NWB R LE Required Braces or Orthoses:  ((R) Limb guard/ (L) Off-loading shoe) Restrictions Weight Bearing Restrictions: Yes RLE Weight Bearing: Non weight bearing Other Position/Activity Restrictions: R BKA with limb guard and L off-loading shoe with toe protection.   Therapy/Group: Individual Therapy  Limmie Patricia, OTR/L,CBIS   Supplemental OT - [facility] and [facility] Secure Chat Preferred   04/01/2022, 3:13 PM

## 2022-04-01 NOTE — Progress Notes (Signed)
Restless night. Mostly complaining of "burning" sensation to left foot. Scabbed areas to 2nd and 3rd toe. Pitting edema noted to LLE, especially to left foot. Left heel red, blanchable with foam dressing in place. Patient declined to have heel elevated. Alfredo Martinez A

## 2022-04-01 NOTE — Progress Notes (Signed)
Physical Therapy Session Note  Patient Details  Name: David Joseph MRN: [id number] Date of Birth: 1940/09/22  Today's Date: 04/01/2022 PT Individual Time: 0800-0900 PT Individual Time Calculation (min): 60 min   Short Term Goals: Week 2:  PT Short Term Goal 1 (Week 2): STG=LTG secondary to ELOS  Skilled Therapeutic Interventions/Progress Updates:      Pt supine in bed to start - agreeable to therapy session without reports of pain. Retrieved disposable pants as patient had no clean pants left. minA for donning at bed level, for threading LLE only. Donned limb guard with assist - pt able to direct care well.   Supine<>sitting EOB with supervision with HOB flat, use of bed rail. Able to scoot forward to EOB and reposition without assist. Donned off-loading shoe onto LLE with totalA. SB transfer with CGA (totalA for board placement) from EOB to PWC.  Pt drove PWC with supervision in rehab unit, able to demonstrate appropriate safety awareness with adjusting speed for turns and in tight spaces.   Assisted with driving in // bars for safety. In // bars, practiced sit<>stands, standing balance/tolerance, and strengthening.  -standing 5 minutes with BUE support with supervision -sit<>Stands 1x5 with supervision in // bars -2x11 standing heel raises on L -2x11 standing hip abd on R -1x11 standing hip flex on R  Returned to room and pt in agreement to remain seated in PWC. Cues for safely parking via 3-point turn in his room. All needs met at end of session.     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 General:     Therapy/Group: Individual Therapy  Mindel Friscia P [name]  PT 04/01/2022, 7:55 AM

## 2022-04-01 NOTE — Patient Instructions (Signed)
Wheelchair Push up, Elbow Extension, Triceps   1. Begin sitting tall in WC.  2. Place hands at your sides on arm rests.  3. Press down through arms raising buttocks from seat. Hold for 3-5 seconds. 4. Return to  starting position slowly and repeat.   *keep your shoulders and trunk in a neutral upright posture.    Perform 5-10 times.     1) Strengthening: Chest Pull - Resisted   Hold Theraband in front of body with hands about shoulder width a part. Pull band a part and back together slowly. Repeat __10-15__ times. Complete ___1_ set(s) per session.. Repeat ___1_ session(s) per day.  http://orth.exer.us/926   Copyright  VHI. All rights reserved.   2) PNF Strengthening: Resisted   Standing with resistive band around each hand, bring right arm up and away, thumb back. Repeat _10-15___ times per set. Do __1__ sets per session. Do __1__ sessions per day.    3) Resisted External Rotation: in Neutral - Bilateral   Sit or stand, tubing in both hands, elbows at sides, bent to 90, forearms forward. Pinch shoulder blades together and rotate forearms out. Keep elbows at sides. Repeat _10-15___ times per set. Do __1__ sets per session. Do _1___ sessions per day.  http://orth.exer.us/966   Copyright  VHI. All rights reserved.   4) PNF Strengthening: Resisted   Standing, hold resistive band above head. Bring right arm down and out from side. Repeat _10-15___ times per set. Do __1__ sets per session. Do __1__ sessions per day.  http://orth.exer.us/922   Copyright  VHI. All rights reserved.

## 2022-04-01 NOTE — Progress Notes (Signed)
Nurse called reporting SBP in the 90's, notes reviewed.  Will encourage PO intake and re-check his blood pressure in a hour. Medication list reviewed, this medication noted on [facility] medication list on 03/14/2022. Will continue to monitor.

## 2022-04-01 NOTE — Progress Notes (Signed)
Occupational Therapy Session Note  Patient Details  Name: David Joseph MRN: [id number] Date of Birth: 04/14/1940  {CHL IP REHAB OT TIME CALCULATIONS:304400400}   Short Term Goals: Week 2:  OT Short Term Goal 1 (Week 2): STG=LTG's d/t LOS  Skilled Therapeutic Interventions/Progress Updates:   Session 1: Pt received *** for skilled OT session with focus on ***. Pt agreeable to interventions, demonstrating overall *** mood. Pt reported ***/10 pain, stating "***" in reference to ***. OT offering intermediate rest breaks and positioning suggestions throughout session to address pain/fatigue and maximize participation/safety in session.    Pt remained *** with all immediate needs met at end of session. Pt continues to be appropriate for skilled OT intervention to promote further functional independence.   Session 2: Pt received *** for skilled OT session with focus on ***. Pt agreeable to interventions, demonstrating overall *** mood. Pt reported ***/10 pain, stating "***" in reference to ***. OT offering intermediate rest breaks and positioning suggestions throughout session to address pain/fatigue and maximize participation/safety in session.    Pt remained *** with all immediate needs met at end of session. Pt continues to be appropriate for skilled OT intervention to promote further functional independence.    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    Therapy/Group: Individual Therapy  Lou Cal, OTR/L, MSOT  04/01/2022, 9:30 PM

## 2022-04-01 NOTE — Progress Notes (Signed)
PROGRESS NOTE   Subjective/Complaints: No new concerns this AM. He is happy his DC date is 1/22 so his ramp can be finished in time.   ROS: +Hearing loss-chronic, neck pain, R face numbness, constipation, denies CP or SOB, abd pain, n/v/d/c, vision changes, cough + phantom pain +scabs in L toes  Objective:   No results found. Recent Labs    04/01/22 0601  WBC 4.0  HGB 8.6*  HCT 25.7*  PLT 149*    Recent Labs    04/01/22 0601  NA 138  K 3.4*  CL 103  CO2 28  GLUCOSE 91  BUN 16  CREATININE 0.73  CALCIUM 8.2*    Intake/Output Summary (Last 24 hours) at 04/01/2022 3838 Last data filed at 04/01/2022 0300 Gross per 24 hour  Intake 3079 ml  Output 1775 ml  Net 1304 ml         Physical Exam: Vital Signs Blood pressure 124/62, pulse 64, temperature 98 F (36.7 C), temperature source Oral, resp. rate 16, height '5\' 8"'  (1.727 m), weight 58.6 kg, SpO2 98 %.  Gen: no distress, normal appearing, in w/c HEENT: oral mucosa pink and moist, NCAT, conjugate gaze Cardio: RRR Pulm: CTAB, normal effort, normal rate of breathing Abd: soft, non-distended, +BS Ext: trace LLE edema mid-calf to ankle Psych: Pt's affect is appropriate. Pt is cooperative Skin: warm and dry   Neuro:   Mental Status: He is oriented to person, place, and time. Follows commands, makes eye contact, cn 2-12 grossly intact    Comments: Decreased to light touch from mid thighs down B/L  Musculoskeletal:     Cervical back: Neck supple. No tenderness.     Comments: UE strength 4+/5 B/L R HF and KE 4/5- limited by pain LLE_ HF 3-/5; KE 3+/5; DF 4/5; PF 4-/5  L olecranon bursitis- no skin breakdown, replaced foam dressing Scabs on L toes- dressed and not uncovered during exam today              Assessment/Plan: 1. Functional deficits which require 3+ hours per day of interdisciplinary therapy in a comprehensive inpatient rehab  setting. Physiatrist is providing close team supervision and 24 hour management of active medical problems listed below. Physiatrist and rehab team continue to assess barriers to discharge/monitor patient progress toward functional and medical goals  Care Tool:  Bathing  Bathing activity did not occur:  (Pt choosing to do simple spongebath due to increased fatigue.) Body parts bathed by patient: Right arm, Left arm, Chest, Abdomen, Front perineal area, Right upper leg, Left upper leg, Face   Body parts bathed by helper: Buttocks Body parts n/a: Right lower leg, Left lower leg   Bathing assist Assist Level: Minimal Assistance - Patient > 75%     Upper Body Dressing/Undressing Upper body dressing   What is the patient wearing?: Pull over shirt    Upper body assist Assist Level: Set up assist    Lower Body Dressing/Undressing Lower body dressing      What is the patient wearing?: Underwear/pull up, Pants     Lower body assist Assist for lower body dressing: Minimal Assistance - Patient > 75%  Toileting Toileting Toileting Activity did not occur Landscape architect and hygiene only): N/A (no void or bm)  Toileting assist Assist for toileting: Minimal Assistance - Patient > 75%     Transfers Chair/bed transfer  Transfers assist     Chair/bed transfer assist level: Moderate Assistance - Patient 50 - 74%     Locomotion Ambulation   Ambulation assist   Ambulation activity did not occur: Safety/medical concerns (Unable to assess gait or stair mobility at this time secondary to recent R BKA with significant weakness and poor endurance/standing tolerance)          Walk 10 feet activity   Assist  Walk 10 feet activity did not occur: Safety/medical concerns        Walk 50 feet activity   Assist Walk 50 feet with 2 turns activity did not occur: Safety/medical concerns         Walk 150 feet activity   Assist Walk 150 feet activity did not occur:  Safety/medical concerns         Walk 10 feet on uneven surface  activity   Assist Walk 10 feet on uneven surfaces activity did not occur: Safety/medical concerns         Wheelchair     Assist Is the patient using a wheelchair?: Yes Type of Wheelchair: Manual    Wheelchair assist level: Supervision/Verbal cueing Max wheelchair distance: 150'    Wheelchair 50 feet with 2 turns activity    Assist        Assist Level: Supervision/Verbal cueing   Wheelchair 150 feet activity     Assist      Assist Level: Supervision/Verbal cueing   Blood pressure 124/62, pulse 64, temperature 98 F (36.7 C), temperature source Oral, resp. rate 16, height '5\' 8"'  (1.727 m), weight 58.6 kg, SpO2 98 %.   Medical Problem List and Plan: 1. 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  Antithrombotics: -DVT/anticoagulation:  Pharmaceutical: Eliquis 16m BID             -antiplatelet therapy: N/A 3. Pain Management:  Oxycodone prn. Voltaren 4g QID PRN. Robaxin 5084mq6h PRN. Tramadol 5040m6h PRN. -Lyrica TID-->75 mg, 50 mg 75 mg. Might benefit from increasing Lyrica due to nerve pain on B/L LE's AND phantom pain  -Pt reports nerve pain and phantom pain under control, will continue current medications and monitor   -1/8 Increase lyrica to 45m22mD  -1/9 reports pain controlled continue to monitor  -1/11 Increase lyrica to 100mg17m  -1/15 reports some benefit to increased lyrica dose 4. Insomnia: continue trazodone prn 5. Neuropsych/cognition: This patient is capable of making  decisions on his own behalf. 6. Skin/Wound Care: Routine pressure relief measures.             -monitor incision for healing.             -protein supplement to promote wound healing.  7.  Fluids/Electrolytes/Nutrition: Monitor I/O. F/up weekly labs (next 04/01/22)             -add protein supplement.  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  Home medication is glipizide 2.36m daily             -will check A1C in am.              -continue to hold glipizide and resume if elevated.   -1/6 CBGs stable, continue to monitor trend  -1/7: glipizide 2.584mresumed  -1/8 monitor for response to medication change  -1/15 occasional elevated CBG but overall under control, continue to monitor  CBG (last 3)  Recent Labs    03/31/22 1646 03/31/22 2228 04/01/22 0604  GLUCAP 210* 130* 86     9. Afib w/RVR: Diagnosed 11/2021 s/p ablation 02/12/22 by Dr. AsCandis Musa -Monitor HR TID.              -Continue metoprolol 256mID and Eliquis. -03/31/22 HR stable, continue to monitor  10. Persistent hyponatremia: Recheck Na level in am.   -1/5 Salt tabs started, recheck BMP tomorrow  -1/8 Na improved to 134   -1/15 Na WNL  11. IgA Lambda MGUS w/7% sideroblasts : Thrombocytopenia stable.   -was getting transfusion PTA due to illness.  -monitor for any signs of bleeding.  12. Acute on chronic anemia: Improved post transfusion w/one unit PRBC up to 8.8 from 6.7 yesterday.              -1/15 stable HGB at 8.6 13. Congestive HFrEF: Heart healthy diet. Continue Lipitor 54m20m and metoprolol XR 25mg79m. Daily weight. 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  16. Loose stools- have stopped pt's scheduled bowel meds- isn't on ABX- might Benefit from fiber if things don't improve.  -1/5 reports improved, continue to monitor -03/31/22 adequate bowel regimen, continue and monitor 17. Hypotensive: will maintain lopressor given afib, continue to monitor BP -03/31/22 BP still relatively stable, continue lopressor and monitor    04/01/2022    6:16 AM 04/01/2022    3:24 AM 03/31/2022    7:39 PM  Vitals with BMI  Weight 129 lbs 3 oz    BMI 19.65  24.46tolic  124 [id number]  62 49  Pulse  64 66    18. Hypokalemia  -1/8 Kdur 40meq t71m, recheck tomorrow  -1/9 K+ improved to 3.6  -Monitor on weekly labs, next 04/01/22  -1/15 Kdur 30meq to51m start 10meq eve43mther day 19. L foot scabs.  Hx of PVD -Keep dry open to air. F/u with vascular after discharge.  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 L olecranon bursitis  -Replaced foam dressing, voltaren prn     LOS: 11 days [place]  YuriMarciano Sequin  Progress 04/01/2022, 8:21 AM

## 2022-04-02 DIAGNOSIS — E871 Hypo-osmolality and hyponatremia: Secondary | ICD-10-CM | POA: Diagnosis not present

## 2022-04-02 DIAGNOSIS — G546 Phantom limb syndrome with pain: Secondary | ICD-10-CM | POA: Diagnosis not present

## 2022-04-02 DIAGNOSIS — Z89511 Acquired absence of right leg below knee: Secondary | ICD-10-CM | POA: Diagnosis not present

## 2022-04-02 DIAGNOSIS — E1142 Type 2 diabetes mellitus with diabetic polyneuropathy: Secondary | ICD-10-CM | POA: Diagnosis not present

## 2022-04-02 LAB — GLUCOSE, CAPILLARY
Glucose-Capillary: 82 mg/dL (ref 70–99)
Glucose-Capillary: 163 mg/dL — ABNORMAL HIGH (ref 70–99)
Glucose-Capillary: 262 mg/dL — ABNORMAL HIGH (ref 70–99)
Glucose-Capillary: 108 mg/dL — ABNORMAL HIGH (ref 70–99)

## 2022-04-02 MED ORDER — METOPROLOL SUCCINATE ER 25 MG PO TB24: 12.5000 mg | ORAL_TABLET | Freq: Two times a day (BID) | ORAL | Status: DC

## 2022-04-02 MED ORDER — POTASSIUM CHLORIDE CRYS ER 10 MEQ PO TBCR
10.0000 meq | EXTENDED_RELEASE_TABLET | Freq: Every day | ORAL | Status: DC
  Administered 2022-04-03: 10 meq via ORAL
  Filled 2022-04-02: qty 1

## 2022-04-02 MED ORDER — METOPROLOL SUCCINATE ER 25 MG PO TB24
12.5000 mg | ORAL_TABLET | Freq: Two times a day (BID) | ORAL | Status: DC
  Administered 2022-04-02 – 2022-04-08 (×13): 12.5 mg via ORAL
  Filled 2022-04-02 (×13): qty 1

## 2022-04-02 NOTE — Progress Notes (Signed)
Physical Therapy Session Note  Patient Details  Name: David Joseph MRN: [id number] Date of Birth: 29-May-1940  Today's Date: 04/02/2022 PT Individual Time: 1000-1115 PT Individual Time Calculation (min): 75 min   Short Term Goals: Week 1:  PT Short Term Goal 1 (Week 1): Patient will perform sit/stand with RW and MinA PT Short Term Goal 1 - Progress (Week 1): Met PT Short Term Goal 2 (Week 1): Patient will perform bed/chair transfers with RW and MinA PT Short Term Goal 2 - Progress (Week 1): Met PT Short Term Goal 3 (Week 1): Patient will initiate gait training of distances <50' with RW PT Short Term Goal 3 - Progress (Week 1): Discontinued (comment) (Patient will discharge at the wheelchair level and will not be ambulatory) Week 2:  PT Short Term Goal 1 (Week 2): STG=LTG secondary to ELOS  Skilled Therapeutic Interventions/Progress Updates:  Patient greeted sitting upright in PWC in room and agreeable to PT treatment session- Barbara Cower from [employer] present for power wheelchair evaluation in order to improve overall independence and household/community mobility. Patient medically requires a mid-wheel drive, left-handed control power wheelchair with a true comfort II (all foam) cushion (16 x 18"), R amputee pad, headrest, tilt feature in order to manage R residual limb edema and facilitate pressure relief due to decreased UE strength and ability to re-position independently. Patient then propelled PWC throughout the 4th floor with education provided regarding how to toggle through various speeds in order to improve independence. Patient was able to park PWC along the mat table, turn it off, and then performed sit/stand with RW and CGA for safety- Minor VC for proper hand placement with inconsistent carryover noted throughout session. Patient then performed stand pivot transfer to mat table with RW and CGA- Patient unable to hop backwards, however close enough to the mat table to safely sit. Patient then  performed sit/stand x5 with RW and CGA- While standing, patient then tasked with hopping posteriorly however was unable to do so and task was downgraded to scooting foot posteriorly. Patient then transferred back to wheelchair with RW and CGA. Patient propelled PWC throughout 4th floor and back to his room with distant supv. Patient left sitting upright in wheelchair in room with call bell within reach, pastor present and all needs met.    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   Therapy/Group: Individual Therapy  David Joseph 04/02/2022, 9:51 AM

## 2022-04-02 NOTE — Progress Notes (Signed)
Occupational Therapy Session Note  Patient Details  Name: David Joseph MRN: [id number] Date of Birth: August 27, 1940  Today's Date: 04/03/2022 OT Individual Time: 1130-1206 OT Individual Time Calculation (min): 36 min   Today's Date: 04/03/2022 OT Individual Time: 1420-1533 OT Individual Time Calculation (min): 73 min   Short Term Goals: Week 2:  OT Short Term Goal 1 (Week 2): STG=LTG's d/t LOS  Skilled Therapeutic Interventions/Progress Updates:   Session 1: Pt received seated in [facility] for skilled OT session with focus on family/caregiver education and discharge planning. Pt agreeable to interventions, demonstrating overall pleasant mood. Pt with un-rated pain.  OT offering intermediate rest breaks and positioning suggestions throughout session to address pain/fatigue and maximize participation/safety in session.   Pt and wife present for caregiver education focused on DME/AE recommendations and functional transfers. Pt/Pt's wife and OT discuss the function/purpose of drop-arm BSC and tub-transfer bench, completing demonstration/hands-on practice of squat-pivot transfer for safety/energy conservation.  Pt remained sitting in [facility] with all immediate needs met at end of session. Pt continues to be appropriate for skilled OT intervention to promote further functional independence.   Session 2: Pt received sitting in [facility] for skilled OT session with focus on UE strengthening/HEP and standing/activity tolerance. Pt agreeable to interventions, demonstrating overall pleasant mood. Pt with un-rated pain, stating "it's doing pretty good." OT offering intermediate rest breaks and positioning suggestions throughout session to address pain/fatigue and maximize participation/safety in session.   Pt drives/maneuvers PWC to all therapy areas with distant supervision. In therapy gym, pt participates in series of UE strengthening exercises: -Chest press -Chest pulls -Lateral raises -Upright rows -Bicep  curls  Pt performs 2 sets/10 reps of each exercise with 2lb DB, requiring multimodal cuing for correct form.  Pt then participates in table-top activity targeting standing/activity tolerance. Pt completes two trials of activity, standing for ~2-3 mins with close supervision+RW. Pt able to take LUE off RW to engage in activity without LOB.   Pt remained resting in bed with all immediate needs met at end of session. Pt continues to be appropriate for skilled OT intervention to promote further functional independence.   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  Therapy/Group: Individual Therapy  Lou Cal, OTR/L, MSOT  04/03/2022, 3:54 PM

## 2022-04-02 NOTE — Plan of Care (Signed)
  Problem: RH KNOWLEDGE DEFICIT LIMB LOSS Goal: RH STG INCREASE KNOWLEDGE OF SELF CARE AFTER LIMB LOSS Outcome:  Progressing

## 2022-04-02 NOTE — Progress Notes (Signed)
At 1923 BP-86/38, rechecked at 2040-99/49. Paged Eunice R/T giving scheduled Toprol XL. Orders to push PO intake and recheck BP. At 2100, manual BP-90/48. Patient drank 1 cup of water. At 2227, Manual BP=102/52. Stump shrinker in place to Right BKA, Left heel and left elbow with foam dressing. Mild pitting edema to LLE, especially to left foot. PRN robaxin given at 2153. Alfredo Martinez A

## 2022-04-02 NOTE — Progress Notes (Signed)
PROGRESS NOTE   Subjective/Complaints: BP was a little soft yesterday.  He was advised to increase PO intake and he has been trying to do this. Continues to have pain, current medications keeping this controlled.   ROS: +Hearing loss-chronic, neck pain, R face numbness, constipation, denies CP or SOB, abd pain, n/v/d/c, HA + phantom pain +scabs in L toes  Objective:   No results found. Recent Labs    04/01/22 0601  WBC 4.0  HGB 8.6*  HCT 25.7*  PLT 149*    Recent Labs    04/01/22 0601  NA 138  K 3.4*  CL 103  CO2 28  GLUCOSE 91  BUN 16  CREATININE 0.73  CALCIUM 8.2*     Intake/Output Summary (Last 24 hours) at 04/02/2022 0820 Last data filed at 04/02/2022 0600 Gross per 24 hour  Intake 600 ml  Output 1000 ml  Net -400 ml         Physical Exam: Vital Signs Blood pressure (!) 96/44, pulse 67, temperature 99.4 F (37.4 C), temperature source Oral, resp. rate 18, height '5\' 8"'  (1.727 m), weight 57.6 kg, SpO2 95 %.  Gen: no distress, normal appearing, in w/c, working with therapy in the gym HEENT: oral mucosa pink and moist, NCAT, conjugate gaze Cardio: RRR Pulm: CTAB, normal effort, normal rate of breathing Abd: soft, non-distended, +BS Ext: trace LLE edema mid-calf to ankle Psych: Pt's affect is appropriate. Pt is cooperative Skin: warm and dry   Neuro:   Mental Status: He is oriented to person, place, and time. Follows commands, makes eye contact, cn 2-12 grossly intact    Comments: Decreased to light touch from mid thighs down B/L  Musculoskeletal:     Cervical back: Neck supple. No tenderness.     Comments: UE strength 4+/5 B/L R HF and KE 4/5- limited by pain LLE_ HF 3-/5; KE 3+/5; DF 4/5; PF 4-/5  L olecranon bursitis- foam dressing in place on elbow Scabs on L toes- see images, appears unchanged from prior                      Assessment/Plan: 1. Functional deficits  which require 3+ hours per day of interdisciplinary therapy in a comprehensive inpatient rehab setting. Physiatrist is providing close team supervision and 24 hour management of active medical problems listed below. Physiatrist and rehab team continue to assess barriers to discharge/monitor patient progress toward functional and medical goals  Care Tool:  Bathing  Bathing activity did not occur:  (Pt choosing to do simple spongebath due to increased fatigue.) Body parts bathed by patient: Right arm, Left arm, Chest, Abdomen, Front perineal area, Right upper leg, Left upper leg, Face   Body parts bathed by helper: Buttocks Body parts n/a: Right lower leg, Left lower leg   Bathing assist Assist Level: Minimal Assistance - Patient > 75%     Upper Body Dressing/Undressing Upper body dressing   What is the patient wearing?: Pull over shirt    Upper body assist Assist Level: Set up assist    Lower Body Dressing/Undressing Lower body dressing      What is the patient wearing?: Underwear/pull up,  Pants     Lower body assist Assist for lower body dressing: Minimal Assistance - Patient > 75%     Toileting Toileting Toileting Activity did not occur (Clothing management and hygiene only): N/A (no void or bm)  Toileting assist Assist for toileting: Minimal Assistance - Patient > 75%     Transfers Chair/bed transfer  Transfers assist     Chair/bed transfer assist level: Moderate Assistance - Patient 50 - 74%     Locomotion Ambulation   Ambulation assist   Ambulation activity did not occur: Safety/medical concerns (Unable to assess gait or stair mobility at this time secondary to recent R BKA with significant weakness and poor endurance/standing tolerance)          Walk 10 feet activity   Assist  Walk 10 feet activity did not occur: Safety/medical concerns        Walk 50 feet activity   Assist Walk 50 feet with 2 turns activity did not occur: Safety/medical  concerns         Walk 150 feet activity   Assist Walk 150 feet activity did not occur: Safety/medical concerns         Walk 10 feet on uneven surface  activity   Assist Walk 10 feet on uneven surfaces activity did not occur: Safety/medical concerns         Wheelchair     Assist Is the patient using a wheelchair?: Yes Type of Wheelchair: Manual    Wheelchair assist level: Supervision/Verbal cueing Max wheelchair distance: 150'    Wheelchair 50 feet with 2 turns activity    Assist        Assist Level: Supervision/Verbal cueing   Wheelchair 150 feet activity     Assist      Assist Level: Supervision/Verbal cueing   Blood pressure (!) 96/44, pulse 67, temperature 99.4 F (37.4 C), temperature source Oral, resp. rate 18, height '5\' 8"'  (1.727 m), weight 57.6 kg, SpO2 95 %.   Medical Problem List and Plan: 1. 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  Antithrombotics: -DVT/anticoagulation:  Pharmaceutical: Eliquis 104m BID             -antiplatelet therapy: N/A 3. Pain Management:  Oxycodone prn. Voltaren 4g QID PRN. Robaxin 5049mq6h PRN. Tramadol 5049m6h PRN. -Lyrica TID-->75 mg, 50 mg 75 mg. Might benefit from increasing Lyrica due to nerve pain on B/L LE's AND phantom pain  -Pt reports nerve pain and phantom pain under control, will continue current medications and monitor   -1/8 Increase lyrica to 56m61mD  -1/9 reports pain controlled continue to monitor  -1/11 Increase lyrica to 100mg70m  -1/16 continue current medications 4. Insomnia: continue trazodone prn 5. Neuropsych/cognition: This patient  is capable of making decisions on his own behalf. 6. Skin/Wound Care: Routine pressure relief measures.             -monitor incision for healing.             -  protein supplement to promote wound healing.  7. Fluids/Electrolytes/Nutrition: Monitor I/O. F/up weekly labs (next 04/01/22)             -add protein supplement.  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  Home medication is glipizide 2.21m daily             -will check A1C in am.              -continue to hold glipizide and resume if elevated.   -1/6 CBGs stable, continue to monitor trend  -1/7: glipizide 2.553mresumed  -1/8 monitor for response to medication change  -1/16 controlled, continue current medications  CBG (last 3)  Recent Labs    04/01/22 2147 04/02/22 0544 04/02/22 1138  GLUCAP 97 82 163*    9. Afib w/RVR: Diagnosed 11/2021 s/p ablation 02/12/22 by Dr. AsCandis Musa -Monitor HR TID.              -Continue metoprolol 2546mID and Eliquis. -03/31/22 HR stable, continue to monitor  10. Persistent hyponatremia: Recheck Na level in am.   -1/5 Salt tabs started, recheck BMP tomorrow  -1/8 Na improved to 134   -1/15 Na WNL   Recheck tomorrow 11. IgA Lambda MGUS w/7% sideroblasts : Thrombocytopenia stable.   -was getting transfusion PTA due to illness.  -monitor for any signs of bleeding.  12. Acute on chronic anemia: Improved post transfusion w/one unit PRBC up to 8.8 from 6.7 yesterday.              -1/15 stable HGB at 8.6 13. Congestive HFrEF: Heart healthy diet. Continue Lipitor 53m63m and metoprolol XR 25mg30m. Daily weight. 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  16. Loose stools- have stopped pt's scheduled bowel meds- isn't on ABX- might Benefit from fiber if things don't improve.  -1/5 reports improved, continue to monitor -03/31/22 adequate bowel regimen, continue and monitor 17. Hypotensive: will maintain lopressor given afib, continue to monitor BP -03/31/22 BP still relatively stable, continue lopressor and monitor -1/16 decrease lopressor dose to 12.5    04/02/2022    7:54 AM 04/02/2022    7:34 AM 04/02/2022    5:54 AM  Vitals with BMI  Weight  127 lbs   BMI  19.31  73.22olic 96  102  Di025olic 44  40  Pulse 67      18. Hypokalemia  -1/8 Kdur 40meq t87m, recheck tomorrow  -1/9 K+ improved to 3.6  -Monitor on weekly labs, next 04/01/22  -1/15 Kdur 30meq to51m start 10meq eve59mther day  1/16 recheck labs tomorrow, check mg also 19. L foot scabs.  Hx of PVD -Keep dry open to air. F/u with vascular after discharge.  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 L olecranon  bursitis  -Replaced foam dressing, voltaren prn     LOS: 12 days A FACE TO FACE EVALUATION WAS PERFORMED  Jennye Boroughs 04/02/2022, 8:20 AM

## 2022-04-03 DIAGNOSIS — G4700 Insomnia, unspecified: Secondary | ICD-10-CM

## 2022-04-03 DIAGNOSIS — E1142 Type 2 diabetes mellitus with diabetic polyneuropathy: Secondary | ICD-10-CM | POA: Diagnosis not present

## 2022-04-03 DIAGNOSIS — E876 Hypokalemia: Secondary | ICD-10-CM | POA: Diagnosis not present

## 2022-04-03 DIAGNOSIS — E871 Hypo-osmolality and hyponatremia: Secondary | ICD-10-CM | POA: Diagnosis not present

## 2022-04-03 DIAGNOSIS — Z89511 Acquired absence of right leg below knee: Secondary | ICD-10-CM | POA: Diagnosis not present

## 2022-04-03 LAB — GLUCOSE, CAPILLARY
Glucose-Capillary: 88 mg/dL (ref 70–99)
Glucose-Capillary: 198 mg/dL — ABNORMAL HIGH (ref 70–99)
Glucose-Capillary: 315 mg/dL — ABNORMAL HIGH (ref 70–99)
Glucose-Capillary: 91 mg/dL (ref 70–99)

## 2022-04-03 LAB — BASIC METABOLIC PANEL
Anion gap: 9 (ref 5–15)
BUN: 19 mg/dL (ref 8–23)
CO2: 26 mmol/L (ref 22–32)
Calcium: 8.7 mg/dL — ABNORMAL LOW (ref 8.9–10.3)
Chloride: 102 mmol/L (ref 98–111)
Creatinine, Ser: 0.72 mg/dL (ref 0.61–1.24)
GFR, Estimated: >60 mL/min (ref >60)
Glucose, Bld: 142 mg/dL — ABNORMAL HIGH (ref 70–99)
Potassium: 3.6 mmol/L (ref 3.5–5.1)
Sodium: 137 mmol/L (ref 135–145)

## 2022-04-03 LAB — MAGNESIUM: Magnesium: 1.9 mg/dL (ref 1.7–2.4)

## 2022-04-03 MED ORDER — METFORMIN HCL 500 MG PO TABS
500.0000 mg | ORAL_TABLET | Freq: Every day | ORAL | Status: DC
  Administered 2022-04-04 – 2022-04-08 (×5): 500 mg via ORAL
  Filled 2022-04-03 (×5): qty 1

## 2022-04-03 MED ORDER — POTASSIUM CHLORIDE CRYS ER 20 MEQ PO TBCR
20.0000 meq | EXTENDED_RELEASE_TABLET | Freq: Every day | ORAL | Status: DC
  Administered 2022-04-04 – 2022-04-08 (×5): 20 meq via ORAL
  Filled 2022-04-03 (×5): qty 1

## 2022-04-03 MED ORDER — MELATONIN 5 MG PO TABS
5.0000 mg | ORAL_TABLET | Freq: Every evening | ORAL | Status: DC | PRN
  Administered 2022-04-03: 5 mg via ORAL
  Filled 2022-04-03: qty 1

## 2022-04-03 NOTE — Progress Notes (Signed)
Family education completed with pt and spouse. Discussed wound care to left foot and shrinker application. All questions answered to pt and spouse satisfaction.  Marylu Lund, RN

## 2022-04-03 NOTE — Progress Notes (Signed)
Patient ID: David Joseph, male   DOB: 08/13/1940, 82 y.o.   MRN: [id number]  Met with pt and spoke with wife via telephone to give both team conference update regarding progress toward his goals of supervision with some min assist for ADL's. [employer] discharge is still 1/22 and [employer] to bring loaner wheelchair tomorrow for him to begin using. Discussed drop-arm bedside commode need will order via [employer] and have delivered to his room. Wife reports ramp to be done by Monday. Will work toward discharge Monday. Will update Ester Rink regarding discharge.

## 2022-04-03 NOTE — Progress Notes (Signed)
PROGRESS NOTE   Subjective/Complaints: Reports poor sleep at night.  Got trazodone 11m last night.   ROS: +Hearing loss-chronic, neck pain, R face numbness, constipation, denies CP or SOB, abd pain, n/v/d/c, HA +insomnia + phantom pain +scabs in L toes  Objective:   No results found. Recent Labs    04/01/22 0601  WBC 4.0  HGB 8.6*  HCT 25.7*  PLT 149*    Recent Labs    04/01/22 0601  NA 138  K 3.4*  CL 103  CO2 28  GLUCOSE 91  BUN 16  CREATININE 0.73  CALCIUM 8.2*     Intake/Output Summary (Last 24 hours) at 04/03/2022 0824 Last data filed at 04/03/2022 05208Gross per 24 hour  Intake 917 ml  Output 3200 ml  Net -2283 ml         Physical Exam: Vital Signs Blood pressure 130/64, pulse 67, temperature 98.3 F (36.8 C), resp. rate 17, height '5\' 8"'  (1.727 m), weight 60.5 kg, SpO2 100 %.  Gen: no distress, normal appearing, in w/c HEENT: oral mucosa pink and moist, NCAT, conjugate gaze Cardio: RRR Pulm: CTAB, normal effort, normal rate of breathing Abd: soft, non-distended, +BS Ext: trace LLE edema mid-calf to ankle Limb protector in place RLE Psych: Pt's affect is appropriate. Pt is cooperative Skin: Scabs on L toes- see images, appears unchanged from prior   Neuro:   Mental Status: He is oriented to person, place, and time. Follows commands, makes eye contact, cn 2-12 grossly intact    Comments: Decreased to light touch from mid thighs down B/L  Musculoskeletal:     Cervical back: Neck supple. No tenderness.     Comments: UE strength 4+/5 B/L R HF and KE 4/5- limited by pain LLE_ HF 3-/5; KE 3+/5; DF 4/5; PF 4-/5  L olecranon bursitis- foam dressing in place on elbow             Assessment/Plan: 1. Functional deficits which require 3+ hours per day of interdisciplinary therapy in a comprehensive inpatient rehab setting. Physiatrist is providing close team supervision and 24 hour  management of active medical problems listed below. Physiatrist and rehab team continue to assess barriers to discharge/monitor patient progress toward functional and medical goals  Care Tool:  Bathing  Bathing activity did not occur:  (Pt choosing to do simple spongebath due to increased fatigue.) Body parts bathed by patient: Right arm, Left arm, Chest, Abdomen, Front perineal area, Right upper leg, Left upper leg, Face   Body parts bathed by helper: Buttocks Body parts n/a: Right lower leg, Left lower leg   Bathing assist Assist Level: Minimal Assistance - Patient > 75%     Upper Body Dressing/Undressing Upper body dressing   What is the patient wearing?: Pull over shirt    Upper body assist Assist Level: Set up assist    Lower Body Dressing/Undressing Lower body dressing      What is the patient wearing?: Underwear/pull up, Pants     Lower body assist Assist for lower body dressing: Minimal Assistance - Patient > 75%     Toileting Toileting Toileting Activity did not occur (Clothing management and hygiene only): N/A (  no void or bm)  Toileting assist Assist for toileting: Minimal Assistance - Patient > 75%     Transfers Chair/bed transfer  Transfers assist     Chair/bed transfer assist level: Moderate Assistance - Patient 50 - 74%     Locomotion Ambulation   Ambulation assist   Ambulation activity did not occur: Safety/medical concerns (Unable to assess gait or stair mobility at this time secondary to recent R BKA with significant weakness and poor endurance/standing tolerance)          Walk 10 feet activity   Assist  Walk 10 feet activity did not occur: Safety/medical concerns        Walk 50 feet activity   Assist Walk 50 feet with 2 turns activity did not occur: Safety/medical concerns         Walk 150 feet activity   Assist Walk 150 feet activity did not occur: Safety/medical concerns         Walk 10 feet on uneven surface   activity   Assist Walk 10 feet on uneven surfaces activity did not occur: Safety/medical concerns         Wheelchair     Assist Is the patient using a wheelchair?: Yes Type of Wheelchair: Manual    Wheelchair assist level: Supervision/Verbal cueing Max wheelchair distance: 150'    Wheelchair 50 feet with 2 turns activity    Assist        Assist Level: Supervision/Verbal cueing   Wheelchair 150 feet activity     Assist      Assist Level: Supervision/Verbal cueing   Blood pressure 130/64, pulse 67, temperature 98.3 F (36.8 C), resp. rate 17, height '5\' 8"'  (1.727 m), weight 60.5 kg, SpO2 100 %.   Medical Problem List and Plan: 1. 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 Formulized individual treatment plan based on medical history, underlying problem and comorbidities.    2.  Antithrombotics: -DVT/anticoagulation:  Pharmaceutical: Eliquis 2m BID             -antiplatelet therapy: N/A 3. Pain Management:  Oxycodone prn. Voltaren 4g QID PRN. Robaxin 5055mq6h PRN. Tramadol 5019m6h PRN. -Lyrica TID-->75 mg, 50 mg 75 mg. Might benefit from increasing Lyrica due to nerve pain on B/L LE's AND phantom pain  -Pt reports nerve pain and phantom pain under control, will continue current medications and monitor   -1/8 Increase lyrica to 28m60mD  -1/9 reports pain controlled continue to monitor  -1/11 Increase lyrica to 100mg26m  -1/17 pain controlled overall, continue to monitor 4.  Insomnia: continue trazodone prn 5. Neuropsych/cognition: This patient is capable of making decisions on his own behalf. 6. Skin/Wound Care: Routine pressure relief measures.             -monitor incision for healing.             -protein  supplement to promote wound healing.  7. Fluids/Electrolytes/Nutrition: Monitor I/O. F/up weekly labs (next 04/01/22)             -add protein supplement.  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  Home medication is glipizide 2.89m daily             -will check A1C in am.              -continue to hold glipizide and resume if elevated.   -1/6 CBGs stable, continue to monitor trend  -1/7: glipizide 2.566mresumed  -1/8 monitor for response to medication change  -1/17 intermittently elevated, hesitant to increase glipizide due to concern of hypoglycemia, will change to metformin  CBG (last 3)  Recent Labs    04/02/22 1648 04/02/22 2109 04/03/22 0608  GLUCAP 262* 108* 88     9. Afib w/RVR: Diagnosed 11/2021 s/p ablation 02/12/22 by Dr. AsCandis Musa -Monitor HR TID.              -Continue metoprolol 2556mID and Eliquis. -03/31/22 HR stable, continue to monitor  10. Persistent hyponatremia: Recheck Na level in am.   -1/5 Salt tabs started, recheck BMP tomorrow  -1/8 Na improved to 134   -1/17 stable at 137 11. IgA Lambda MGUS w/7% sideroblasts : Thrombocytopenia stable.   -was getting transfusion PTA due to illness.  -monitor for any signs of bleeding.  12. Acute on chronic anemia: Improved post transfusion w/one unit PRBC up to 8.8 from 6.7 yesterday.              -1/15 stable HGB at 8.6 13. Congestive HFrEF: Heart healthy diet. Continue Lipitor 80m86m and metoprolol XR 25mg82m. Daily weight. 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  16. Loose stools- have stopped pt's scheduled bowel meds- isn't on ABX- might Benefit from fiber if things don't improve.  -1/5 reports improved, continue to monitor -03/31/22 adequate bowel regimen, continue and monitor 17. Hypotensive: will maintain lopressor given afib, continue to monitor BP -03/31/22 BP still relatively stable, continue lopressor and monitor -1/16 decrease lopressor dose to 12.5 -1/17 Improved, continue to monitor    04/03/2022    4:32 AM 04/03/2022    4:31 AM 04/02/2022    7:37 PM  Vitals with BMI  Weight  133 lbs 6 oz   BMI  20.28  64.33olic 130  [id number] 64  55  Pulse 67  75    18. Hypokalemia  -1/8 Kdur 40meq t70m, recheck tomorrow  -1/9 K+ improved to 3.6  -Monitor on weekly labs, next 04/01/22  -1/15 Kdur 30meq to34m start 10meq eve1mther day  1/17 will increase kdur to 20meq as K51mproved to 3.6 but is still suboptimal  19. L foot scabs.  Hx of PVD -Keep dry open to air. F/u with vascular after discharge.  -Had LLE endarterectomy with bovine patch angioplasty of CFA, SFA and profunda, fem-BK pop  bypass with PTFE for CLI by Dr. 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 L olecranon bursitis  -Replaced foam dressing, voltaren prn 21. Insomnia   -1/17 not improved with trazodone 28m, will try melatonin PRN     LOS: 13 days A FACE TO FACE EVALUATION WAS PERFORMED  YJennye Boroughs1/17/2024, 8:24 AM

## 2022-04-03 NOTE — Patient Care Conference (Signed)
Inpatient RehabilitationTeam Conference and Plan of Care Update Date: 04/03/2022   Time: 12:20 PM    Patient Name: David Joseph      Medical Record Number: [id number]  Date of Birth: [birth year] Sex: Male         Room/Bed: 4W02C/4W02C-01 Payor Info: Payor: MEDICARE / Plan: MEDICARE PART A AND B / Product Type: *No Product type* /    Admit Date/Time:  03/21/2022 12:20 PM  Primary Diagnosis:  Unilateral complete BKA, right, subsequent encounter [facility])  Hospital Problems: Principal Problem:   Unilateral complete BKA, right, subsequent encounter (HCC) Active Problems:   Acute blood loss anemia (ABLA)   Olecranon bursitis of left elbow   Chronic systolic congestive heart failure (HCC)   Anemia   S/P unilateral BKA (below knee amputation), right [facility])   Hypotension    Expected Discharge Date: Expected Discharge Date: 04/08/22  Team Members Present: Physician leading conference: Dr. Fanny Dance Social Worker Present: Dossie Der, LCSW Nurse Present: Chana Bode, RN PT Present: Amedeo Plenty, PT OT Present: Jake Shark, OT     Current Status/Progress Goal Weekly Team Focus  Bowel/Bladder   Patient is continent of bladderB/B, LBM 04/01/22   Maintain regularity og B/B daily and prn   Assess toileting needs QS/PRN adminster meds as ordered    Swallow/Nutrition/ Hydration               ADL's   (S) UB ADls, min A LB ADLs, CGA-min A transfers   Supervision overall   ADL, transfers, endurance, AE use, d/c planning    Mobility   ModI bed mobility; CGA/MinA for sit/stands and transfers with RW; MinA for gait up to 10' with RW- Continues to be limited by L LE weakness and B hand/shoulder arthritis.   Supv at the wheelchair level with power wheelchair  Global strengthening, dynamic stability, pre-prosthetic education, gait, transfers, sit/stands, power wheelchair mobility, discharge planning    Communication                Safety/Cognition/ Behavioral Observations                Pain   s/p Right BKA   < 3/10 on pain scale   Assess Right stump wound QS/PRN and assess Q 1 hour prior to therapy    Skin   Right surgical stump area healing well, no new drainage, staples intact   Pevent infection and skin breakdown  QS/PRN assessment address changes      Discharge Planning:  Wife has been in multiple times to observe in therapies preparing home for wheelchair level-ramps outside and within home. WC evaluation yesterday   Team Discussion: Patient with staples to right BKA; has a shrinker and limb guard. [employer] shoe to left foot; toes OTA. Pain is managed and patient is continent however limited by poor recall and carry over and fatigue along with baseline wrist/shoulder issues. Patient on target to meet rehab goals: Currently needs supervision for upper body care and min assist for lower body care with min assist for pivots. No gait recommended for safety. Needs CGA - min for sit - stand.  *See Care Plan and progress notes for long and short-term goals.   Revisions to Treatment Plan:  Downgraded to supervison goals' Power chair eval   Teaching Needs: Safety, transfers, toileting, medications, skin care, etc.  Current Barriers to Discharge: Decreased caregiver support, Home enviroment access/layout, and Weight bearing restrictions  Possible Resolutions to Barriers: Ramp for entry to home Family education  DME: DA - [facility], W/C HH follow up services     Medical Summary Current Status: RBKA, PVD, DM2, Hypokalemia, hypotension, CHF  Barriers to Discharge: Self-care education;Hypotension;Medical stability  Barriers to Discharge Comments: RBKA, PVD, DM2, Hypokalemia, hypotension, CHF Possible Resolutions to [employer] Focus: monitor incision, monitor L foot circulation, decreased lopressor, monitor weight   Continued Need for Acute Rehabilitation Level of Care: The patient requires daily medical management by a physician with specialized  training in physical medicine and rehabilitation for the following reasons: Direction of a multidisciplinary physical rehabilitation program to maximize functional independence : Yes Medical management of patient stability for increased activity during participation in an intensive rehabilitation regime.: Yes Analysis of laboratory values and/or radiology reports with any subsequent need for medication adjustment and/or medical intervention. : Yes   I attest that I was present, lead the team conference, and concur with the assessment and plan of the team.   Chana Bode B 04/03/2022, 1:17 PM

## 2022-04-03 NOTE — Progress Notes (Signed)
Physical Therapy Session Note  Patient Details  Name: David Joseph MRN: [id number] Date of Birth: 03-03-1941  Today's Date: 04/03/2022 PT Individual Time: 1000-1050 PT Individual Time Calculation (min): 50 min   Short Term Goals: Week 2:  PT Short Term Goal 1 (Week 2): STG=LTG secondary to ELOS  Skilled Therapeutic Interventions/Progress Updates: Pt presented in PWC with wife present agreeable to therapy. Pt states unrated pain, premedicated, and no pain behaviors noted during session. Session focused on family education, incorporating functional transfers, and PWC navigation. Discussed with wife pt's current functional status (supervision bed mobility, CGA to MinA for stand pivot transfers). PTA also answered questions regarding PWC to wife to best ability at current level then was able to obtain additional answers from primary PT and provide remaining answers to wife after session. Pt then navigated PWC to ortho gym with supervision and improved safe navigation as compared to previous time with this therapist. Pt was able to safely park next to car and wife was able to provide appropriate guarding while pt performed stand pivot (poor clearance thus more of a shuffle) between PWC and car. After brief rest pt was able to transfer back to [facility] with CGA and fair safety with RW. Discussed with wife and pt ramp for entry to house. Wife was able to provide pictures of where ramp will be set up and ramp inside of house. Pt then practiced navigating up/down ramp in ortho gym and performing 0 degree turn at landing similar to home set up. Pt then navigated back to room in same manner as prior continuing to practice navigation in small spaces (pt did run into bed at one point, but was able to correct with minimal cues). Pt left in PWC at end of session with wife and RN present for continued family education.      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 General:   Vital Signs:   Pain:   Mobility:   Locomotion :    Trunk/Postural Assessment :    Balance:   Exercises:   Other Treatments:      Therapy/Group: Individual Therapy  Zanetta Dehaan 04/03/2022, 4:25 PM

## 2022-04-03 NOTE — Progress Notes (Signed)
Occupational Therapy Session Note  Patient Details  Name: David Joseph MRN: [id number] Date of Birth: [birth year]  Today's Date: 04/03/2022 OT Individual Time: 0817-0902 OT Individual Time Calculation (min): 45 min    Short Term Goals: Week 2:  OT Short Term Goal 1 (Week 2): STG=LTG's d/t LOS  Skilled Therapeutic Interventions/Progress Updates:    Pt received supine with no c/o pain, agreeable to OT session. Spent first half of session problem solving through shower transfer at home with pt providing pictures of his options at home. He already has a TTB and a demonstration was provided re options. Also encouraged pt to wait for HHOT to complete the transfer for the first time. He donned L shoe and limb guard supine. He came to EOB with mod I using bed rail. Sit > stand from the EOB (slightly elevated) with CGA and pivot to the w/c with CGA using the RW. He completed power w/c propulsion to the gym with (S). Stand pivot transfer to the mat with CGA using the RW. He completed 2x10 glute bridges to strengthen posterior chain needed during ADL transfers. He transferred back to the w/c with CGA using the RW. He navigated the w/c back to his room and was left with all needs met.   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   Therapy/Group: Individual Therapy  Crissie Reese 04/03/2022, 6:39 AM

## 2022-04-03 NOTE — Progress Notes (Signed)
Physical Therapy Session Note  Patient Details  Name: David Joseph MRN: [id number] Date of Birth: 1941-01-21  Today's Date: 04/03/2022 PT Individual Time: 1310-1414 PT Individual Time Calculation (min): 64 min   Short Term Goals: Week 2:  PT Short Term Goal 1 (Week 2): STG=LTG secondary to ELOS  Skilled Therapeutic Interventions/Progress Updates:      Pt supine in bed to start - in agreement to therapy session. Denies pain. Has limb guard on, only needing assist for strapping around waist - pt directs care well.  Supine<>sitting EOB without assist. SB transfer with CGA from EOB to PWC, assist for board placement. Pt drove himself in [facility] with distant supervision in rehab hallways and through doorways. Pt able to slow/increase speed controls without cues.   Assisted with driving PWC inside // bars for safety.  In // bars, completed the following: -sit<>stands with supervision and BUE pulling from bars. -2x59ft forward/backward hopping with minA -2x11 hip abduction on R -2x11 hip flexion on R -2x11 mini-squats on L *Seated rest breaks b/w sets for recovery 2/2 fatigue.  Patient drove himself back to his room in [facility] with distant supervision, >257ft. Concluded session seated in PWC with all needs met, call bell in reach. Pt aware of upcoming therapy session.   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 General:    Therapy/Group: Individual Therapy  Deni Lefever P Deanna Boehlke PT 04/03/2022, 2:12 PM

## 2022-04-03 NOTE — Progress Notes (Signed)
No acute distress or discomfort, medicated x 1 for pain , Continue sleep chart per orders, received,Trazodone po, 8/12 Hr .Continue regime,

## 2022-04-04 DIAGNOSIS — I739 Peripheral vascular disease, unspecified: Secondary | ICD-10-CM

## 2022-04-04 DIAGNOSIS — E1142 Type 2 diabetes mellitus with diabetic polyneuropathy: Secondary | ICD-10-CM | POA: Diagnosis not present

## 2022-04-04 DIAGNOSIS — G4700 Insomnia, unspecified: Secondary | ICD-10-CM

## 2022-04-04 DIAGNOSIS — Z89511 Acquired absence of right leg below knee: Secondary | ICD-10-CM | POA: Diagnosis not present

## 2022-04-04 DIAGNOSIS — I5022 Chronic systolic (congestive) heart failure: Secondary | ICD-10-CM | POA: Diagnosis not present

## 2022-04-04 LAB — GLUCOSE, CAPILLARY
Glucose-Capillary: 84 mg/dL (ref 70–99)
Glucose-Capillary: 153 mg/dL — ABNORMAL HIGH (ref 70–99)
Glucose-Capillary: 181 mg/dL — ABNORMAL HIGH (ref 70–99)
Glucose-Capillary: 136 mg/dL — ABNORMAL HIGH (ref 70–99)

## 2022-04-04 NOTE — Progress Notes (Signed)
Occupational Therapy Session Note  Patient Details  Name: David Joseph MRN: [id number] Date of Birth: Nov 11, 1940  Today's Date: 04/04/2022 OT Individual Time: 8610-0429 OT Individual Time Calculation (min): 77 min    Short Term Goals: Week 2:  OT Short Term Goal 1 (Week 2): STG=LTG's d/t LOS  Skilled Therapeutic Interventions/Progress Updates:     Pt received sitting up in wc in good spirits and receptive to skilled OT session. Pt reporting 0/10 pain. Pt wearing R wrist brace upon OT arrival. Pt recently receiving loner PWC earlier today and requesting assistance in learning features/opperations and making a cheat sheet. Spend increased time during beginning of session learning new PWC features with education provided on each feature to Pt. Pt created visual diagram to increase independence in operating PWC and practiced adjusting wc utilizing diagram. OT facilitated wc mobility task for further practice to increase Pt independence and safety in PWC with Pt able to navigate through tight spaces with min cueing +time.  Pt DABSC delivered to room. Set-up DABSC for Pt and adjusted to appropriate height. Per Pt report, he had not yet learned or practiced transfers to [facility]. Pt educated on stand pivot vs. squat pivot transfers with opportunities given to practice both. Pt stand pivot > BSC min A for balance and to guide hips. When returning to wc, Pt reported he was too tired to complete another hop to get close enough to PCW to sit requiring OT to bring PWC behind Pt. Provided rest break and education on techniques for completing clothing management on BSC. Pt completed squat pivot to BSC to L side to simulate home environment CGA with min cues for safety to guide hips. Pt doff/donned pants seated on BSC by leaning R/L with supervision +time. Pt also practiced doff/donning pants in standing alternating pulling pants to/from waist with CGA with pt reporting he prefers seated method. Pt completed squat  pivot back to wc CGA to guide hips. Pt excited and motivated to learn new transfer and be working towards mod I for bathroom transfers and toileting. Pt squat pivot to bed supervision. Pt was left resting in bed with call bell in reach, bed alarm on, and all needs met.   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 General:   Vital Signs: Therapy Vitals Temp: 98 F (36.7 C) Temp Source: Oral Pulse Rate: 72 Resp: 16 BP: (Abnormal) 95/44 Patient Position (if appropriate): Sitting Oxygen Therapy SpO2: 100 % O2 Device: Room Air Pain:   Therapy/Group: Individual Therapy  Army Fossa 04/04/2022, 3:12 PM

## 2022-04-04 NOTE — Progress Notes (Signed)
Physical Therapy Session Note  Patient Details  Name: David Joseph MRN: [id number] Date of Birth: May 21, 1940  Today's Date: 04/04/2022 PT Individual Time: 1791-5248 PT Individual Time Calculation (min): 50 min   Short Term Goals: Week 2:  PT Short Term Goal 1 (Week 2): STG=LTG secondary to ELOS  Skilled Therapeutic Interventions/Progress Updates: Pt presented in bed agreeable to therapy. Pt c/o pain 4/10 in wrists and residual limb, premedicated and rest breaks provided as needed during session. Pt changed shirts in long sitting with set up. Pt donned limb guard with supervision and limb guard support with minA. Pt transferred to EOB and dependently set up with slide board. Performed Slide board transfer with supervision to [facility]. Pt then navigated to sink to perform oral hygiene mod I. Remaining session focused on w/c mobility in smaller and more congested spaces. Pt navigated to day room and set up with obstacle course weaving through cones specifically through close spaces and integrating use of 0 degree turns. Pt initially with some difficulty however after a few rounds was able to navigate clearly and safety. Pt then navigated to ortho gym and practiced ramp and 0 degree turn with improved technique from yesterday. Pt then navigated to 4N tower and was able to perform turn in elevator to exit at second floor to main entrance. Pt then navigated through pre-admission area (although not super crowded) and returned to elevator to transfer back to 4th floor. Pt ultimately returned to unit and room and remained in [facility] with call bell within reach and needs met.      Therapy Documentation Precautions:  Precautions Precautions: Fall Precaution Comments: NWB R LE Required Braces or Orthoses:  ((R) Limb guard/ (L) Off-loading shoe) Restrictions Weight Bearing Restrictions: Yes RLE Weight Bearing: Non weight bearing Other Position/Activity Restrictions: R BKA with limb guard and L off-loading shoe  with toe protection. General:   Vital Signs: Therapy Vitals Temp: 98 F (36.7 C) Temp Source: Oral Pulse Rate: 72 Resp: 16 BP: (!) 95/44 Patient Position (if appropriate): Sitting Oxygen Therapy SpO2: 100 % O2 Device: Room Air Pain:   Mobility:   Locomotion :    Trunk/Postural Assessment :    Balance:   Exercises:   Other Treatments:      Therapy/Group: Individual Therapy  Jonai Weyland 04/04/2022, 4:28 PM

## 2022-04-04 NOTE — Progress Notes (Signed)
Cooperative , restful during the shift after schedule and PRN medications, Continue sleep chart as noted 7-8 hrs,Medical regime  continue as written

## 2022-04-04 NOTE — Progress Notes (Signed)
Physical Therapy Session Note  Patient Details  Name: David Joseph MRN: [id number] Date of Birth: September 22, 1940  Today's Date: 04/04/2022 PT Individual Time: 1000-1100 PT Individual Time Calculation (min): 60 min   Short Term Goals: Week 1:  PT Short Term Goal 1 (Week 1): Patient will perform sit/stand with RW and MinA PT Short Term Goal 1 - Progress (Week 1): Met PT Short Term Goal 2 (Week 1): Patient will perform bed/chair transfers with RW and MinA PT Short Term Goal 2 - Progress (Week 1): Met PT Short Term Goal 3 (Week 1): Patient will initiate gait training of distances <50' with RW PT Short Term Goal 3 - Progress (Week 1): Discontinued (comment) (Patient will discharge at the wheelchair level and will not be ambulatory) Week 2:  PT Short Term Goal 1 (Week 2): STG=LTG secondary to ELOS  Skilled Therapeutic Interventions/Progress Updates:  Patient greeted sitting upright in PWC in room and agreeable to PT treatment session. Jason from [place] present during first part of treatment session to deliver loaner/personal power wheelchair and made adjustments to foot rests, headrest and various other features in order to ensure proper body mechanics. Patient educated on features of PWC, how to operate the controls, etc. Patient performed sit/stand from current PWC with RW and then performed a 180 degree pivot in order to sit in new PWC all with CGA from therapist for safety. While in new PWC, patient practiced with the various features in order to improve overall independence.   Patient propelled PWC to ortho gym with distant supv and appropriate obstacle negotiation, as well as ability to fit through tight spaces/doorways.   Patient performed a car transfer with RW and CGA for safety- Patient was able to properly align his PWC with the car simulator and recalled turning it off prior to standing. Once seated in the car, patient was able to place B LE into/out of the car independently. Patient  pivoted back toward the PWC with RW and CGA, however continues to be unable to hop backwards requiring him to send his bottom far back in order to make it safely to the PWC.   Patient returned to his room sitting upright in PWC with tray table nearby, call bell within reach and all needs met.   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   Therapy/Group: Individual Therapy  Elvin Mccartin 04/04/2022, 7:51 AM

## 2022-04-04 NOTE — Progress Notes (Signed)
Occupational Therapy Session Note  Patient Details  Name: David Joseph MRN: [id number] Date of Birth: 04/26/1940  Today's Date: 04/04/2022 OT Individual Time: 1100-1200 OT Individual Time Calculation (min): 60 min    Short Term Goals: Week 2:  OT Short Term Goal 1 (Week 2): STG=LTG's d/t LOS  Skilled Therapeutic Interventions/Progress Updates:   Pt seen for skilled OT session this am. Loaner PWC delivered prior session therefore pt just becoming accustom to it. OT set up charger and reviewed basic functions. Pt needs further trng on this and will need a session to dedicate to making a "cheat sheet" as per pt request in order to refer easily to [facility] features. 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 OT trained pt in voice texting and pt was able to send 5 texts via voice in 2 minutes after teaching. May need a few refreshers d/t novel strategy. Pt able to navigate in PWC to and from far ortho clinic with occ min a d/t novelty of power seating to maneuver in tighter spaces and line chair up for certain activity. Pt tolerated 10 minutes of moist heat to B wrists with + results and OT issued a stock wrist/thumb support brace for pain relief and support with relative success. Pt able to wear for B UE restorator on L 1 seated for 2 min x 4 sets with rests between sets and 2/10 pain down from 6/10 pain with same task with brace on. Pt has braces at home but wife unable to locate therefore pt will trial this R wrist brace and if effective, OT can provide the L. Once back in room, pt set up for lunch with all needs in place and in the care of NT for vitals.   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   Therapy/Group: Individual Therapy  Vicenta Dunning 04/04/2022, 7:37 AM

## 2022-04-04 NOTE — Progress Notes (Addendum)
PROGRESS NOTE   Subjective/Complaints: Reports poor sleep at night.  Got trazodone 54m last night.   ROS: +Hearing loss-chronic, denies CP or SOB, abd pain, n/v/d/c, HA, rash +insomnia + phantom pain +scabs in L toes  Objective:   No results found. No results for input(s): "WBC", "HGB", "HCT", "PLT" in the last 72 hours.  Recent Labs    04/03/22 0742  NA 137  K 3.6  CL 102  CO2 26  GLUCOSE 142*  BUN 19  CREATININE 0.72  CALCIUM 8.7*     Intake/Output Summary (Last 24 hours) at 04/04/2022 1226 Last data filed at 04/04/2022 0835 Gross per 24 hour  Intake 700 ml  Output 2100 ml  Net -1400 ml         Physical Exam: Vital Signs Blood pressure (!) 106/51, pulse 62, temperature 97.7 F (36.5 C), resp. rate 18, height '5\' 8"'  (1.727 m), weight 63.1 kg, SpO2 100 %.  Gen: no distress, normal appearing, in w/c HEENT: oral mucosa pink and moist, NCAT, conjugate gaze Cardio: RRR Pulm: CTAB, normal effort, normal rate of breathing Abd: soft, non-tender, non-distended, +BS Ext: trace LLE edema mid-calf to ankle Limb protector in place RLE Psych: Pt's affect is appropriate. Pt is cooperative Skin: Scabs on L toes- unchanged from yesterday   Neuro:   Mental Status: He is oriented to person, place, and time. Follows commands, makes eye contact, cn 2-12 grossly intact    Comments: Decreased to light touch from mid thighs down B/L  Musculoskeletal:     Cervical back: Neck supple. No tenderness.     Comments: UE strength 4+/5 B/L R HF and KE 4/5- limited by pain LLE_ HF 3-/5; KE 3+/5; DF 4/5; PF 4-/5  L olecranon bursitis- foam dressing in place on elbow             Assessment/Plan: 1. Functional deficits which require 3+ hours per day of interdisciplinary therapy in a comprehensive inpatient rehab setting. Physiatrist is providing close team supervision and 24 hour management of active medical problems  listed below. Physiatrist and rehab team continue to assess barriers to discharge/monitor patient progress toward functional and medical goals  Care Tool:  Bathing  Bathing activity did not occur:  (Pt choosing to do simple spongebath due to increased fatigue.) Body parts bathed by patient: Right arm, Left arm, Chest, Abdomen, Front perineal area, Right upper leg, Left upper leg, Face   Body parts bathed by helper: Buttocks Body parts n/a: Right lower leg, Left lower leg   Bathing assist Assist Level: Minimal Assistance - Patient > 75%     Upper Body Dressing/Undressing Upper body dressing   What is the patient wearing?: Pull over shirt    Upper body assist Assist Level: Set up assist    Lower Body Dressing/Undressing Lower body dressing      What is the patient wearing?: Underwear/pull up, Pants     Lower body assist Assist for lower body dressing: Minimal Assistance - Patient > 75%     Toileting Toileting Toileting Activity did not occur (Clothing management and hygiene only): N/A (no void or bm)  Toileting assist Assist for toileting: Minimal Assistance - Patient >  75%     Transfers Chair/bed transfer  Transfers assist     Chair/bed transfer assist level: Moderate Assistance - Patient 50 - 74%     Locomotion Ambulation   Ambulation assist   Ambulation activity did not occur: Safety/medical concerns (Unable to assess gait or stair mobility at this time secondary to recent R BKA with significant weakness and poor endurance/standing tolerance)          Walk 10 feet activity   Assist  Walk 10 feet activity did not occur: Safety/medical concerns        Walk 50 feet activity   Assist Walk 50 feet with 2 turns activity did not occur: Safety/medical concerns         Walk 150 feet activity   Assist Walk 150 feet activity did not occur: Safety/medical concerns         Walk 10 feet on uneven surface  activity   Assist Walk 10 feet on  uneven surfaces activity did not occur: Safety/medical concerns         Wheelchair     Assist Is the patient using a wheelchair?: Yes Type of Wheelchair: Manual    Wheelchair assist level: Supervision/Verbal cueing Max wheelchair distance: 150'    Wheelchair 50 feet with 2 turns activity    Assist        Assist Level: Supervision/Verbal cueing   Wheelchair 150 feet activity     Assist      Assist Level: Supervision/Verbal cueing   Blood pressure (!) 106/51, pulse 62, temperature 97.7 F (36.5 C), resp. rate 18, height '5\' 8"'  (1.727 m), weight 63.1 kg, SpO2 100 %.   Medical Problem List and Plan: 1. 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  Antithrombotics: -DVT/anticoagulation:  Pharmaceutical: Eliquis 12m BID             -antiplatelet therapy: N/A 3. Pain Management:  Oxycodone prn. Voltaren 4g QID PRN. Robaxin 5065mq6h PRN. Tramadol 5061m6h PRN. -Lyrica TID-->75 mg, 50 mg 75 mg. Might benefit from increasing Lyrica due to nerve pain on B/L LE's AND phantom pain  -Pt reports nerve pain and phantom pain under control, will continue current medications and monitor   -1/8 Increase lyrica to 64m13mD  -1/9 reports pain controlled continue to monitor  -1/11 Increase lyrica to 100mg70m  -1/17 pain controlled overall, continue to monitor 4. Insomnia: continue trazodone prn 5. Neuropsych/cognition: This patient is capable of making decisions on his own behalf. 6. Skin/Wound Care: Routine pressure relief measures.             -monitor incision for healing.             -protein supplement to promote wound healing.  7. Fluids/Electrolytes/Nutrition: Monitor I/O. F/up weekly labs (next 04/01/22)             -add protein supplement.   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  Home medication is glipizide 2.5mg d64my             -will check A1C in am.              -  continue to hold glipizide and resume if elevated.   -1/6 CBGs stable, continue to monitor trend  -1/7: glipizide 2.48m resumed  -1/8 monitor for response to medication change  -1/17 intermittently elevated, hesitant to increase glipizide due to concern of hypoglycemia, will change to metformin  1/18 improved, continue to monitor response to medication change  CBG (last 3)  Recent Labs    04/03/22 2049 04/04/22 0624 04/04/22 1206  GLUCAP 91 84 153*     9. Afib w/RVR: Diagnosed 11/2021 s/p ablation 02/12/22 by Dr. ACandis Musa  -Monitor HR TID.              -Continue metoprolol 26mBID and Eliquis. -03/31/22 HR stable, continue to monitor  10. Persistent hyponatremia: Recheck Na level in am.   -1/5 Salt tabs started, recheck BMP tomorrow  -1/8 Na improved to 134   -1/17 stable at 137 11. IgA Lambda MGUS w/7% sideroblasts : Thrombocytopenia stable.   -was getting transfusion PTA due to illness.  -monitor for any signs of bleeding.  12. Acute on chronic anemia: Improved post transfusion w/one unit PRBC up to 8.8 from 6.7 yesterday.              -1/15 stable HGB at 8.6 13. Congestive HFrEF: Heart healthy diet. Continue Lipitor 8020mD and metoprolol XR 40m66mD. Daily weight. 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  16. Loose stools- have stopped pt's scheduled bowel meds- isn't on ABX- might Benefit from fiber if things don't improve.  -1/5 reports improved, continue to monitor -03/31/22 adequate bowel regimen, continue and monitor 17. Hypotensive: will maintain lopressor given afib, continue to monitor BP -03/31/22 BP still relatively stable, continue lopressor and monitor -1/16 decrease lopressor dose to 12.5 -1/18 stable, continue current medications    04/04/2022    3:30 AM 04/03/2022    7:48 PM 04/03/2022    4:32 AM  Vitals with BMI  Weight 139 lbs 2 oz    BMI 21.16 20.35stolic 106  1597 D416tolic 51  64  Pulse 62 73 67    18. Hypokalemia  -1/8 Kdur 40meq 53my, recheck tomorrow  -1/9 K+ improved to 3.6  -Monitor on weekly labs, next 04/01/22  -1/15 Kdur 30meq t11m, start 10meq ev41mother day  1/17 will increase kdur to 20meq as 47mmproved to 3.6 but is still suboptimal  19. L foot scabs.  Hx of PVD -Keep dry open to air. F/u with vascular after discharge.  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 L olecranon bursitis  -Replaced foam dressing, voltaren prn 21. Insomnia   -1/17 not improved with trazodone 50mg, will52m melatonin PRN  -1/18 improved continue current medications     LOS: 14 days A FACE TO FACE EVALUATION WAS PERFORMED  Brenon Antosh ShtridJennye Boroughs 12:26  PM

## 2022-04-04 NOTE — Progress Notes (Signed)
Patient ID: David Joseph, male   DOB: Jun 22, 1940, 82 y.o.   MRN: [id number]  Met with wife who is here to discuss she needs one more PT session to participate in so scheduled for 11:00 on Sat. Pt has a his power chair to begin using and getting used to. Work toward discharge on Monday. Drop-arm bedside commode in room wife to take home.

## 2022-04-05 DIAGNOSIS — I739 Peripheral vascular disease, unspecified: Secondary | ICD-10-CM | POA: Diagnosis not present

## 2022-04-05 DIAGNOSIS — I5022 Chronic systolic (congestive) heart failure: Secondary | ICD-10-CM | POA: Diagnosis not present

## 2022-04-05 DIAGNOSIS — Z89511 Acquired absence of right leg below knee: Secondary | ICD-10-CM | POA: Diagnosis not present

## 2022-04-05 DIAGNOSIS — I959 Hypotension, unspecified: Secondary | ICD-10-CM | POA: Diagnosis not present

## 2022-04-05 LAB — GLUCOSE, CAPILLARY
Glucose-Capillary: 93 mg/dL (ref 70–99)
Glucose-Capillary: 196 mg/dL — ABNORMAL HIGH (ref 70–99)
Glucose-Capillary: 171 mg/dL — ABNORMAL HIGH (ref 70–99)
Glucose-Capillary: 166 mg/dL — ABNORMAL HIGH (ref 70–99)

## 2022-04-05 NOTE — Progress Notes (Signed)
Occupational Therapy Session Note  Patient Details  Name: DAEMION MCNIEL MRN: [id number] Date of Birth: 22-Feb-1941  Today's Date: 04/05/2022 OT Individual Time: 1422-1536 OT Individual Time Calculation (min): 74 min    Short Term Goals: Week 2:  OT Short Term Goal 1 (Week 2): STG=LTG's d/t LOS  Skilled Therapeutic Interventions/Progress Updates:  Pt greeted supine in bed, pt agreeable to OT intervention. Session focus on education and problem solving ADLs, functional mobility, dynamic standing balance, BUE strength/endurance and decreasing overall caregiver burden.                 Pt with questions about how to cover residual limb for shower with education provided on limb wrapping with tape. Also discussed general shower transfer method at home with recommendation of using TTB to complete lateral scoot from [facility] ( with assist from [employer]). Discussed toileting techniques with pt planning to squat pivot to [facility] over toilet and then stand with RW to complete pericare. Had pt complete simulated toileting tasks in gym with pt able to stand to RW with MINA and use BUEs to reach posteriorly to remove clothespins to simulate pericare.   Additionally worked on standing tolerance for ADL participation with pt able to stand for 3 mins and 16 secs to complete peg board task with unilateral support from RW with CGA. Additionally worked on [employer] with pt completing below therex with 3lb dowel rod to promote improved BUE strength:   X30 OH presses X30 chest presses  Pt also completed 3 mins of continuous BUE AROM with pt using 3 lb dowel rod to toss beach ball back and forth. Pt required 3 rest breaks during task d/t fatigue.   Pt completed w/c mobility with supervision throughout session.  Pt did need one cue to remember to turn off chair during mobility and to elevate foot plate when chair was moving. Pt completed lateral scoot transfers from EOB<>PWC with close supervision.  Ended session  with pt seated in PWC with all needs within reach.          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  Pain: no pain    Therapy/Group: Individual Therapy  Pollyann Glen [facility] 04/05/2022, 3:54 PM

## 2022-04-05 NOTE — Progress Notes (Signed)
Physical Therapy Session Note  Patient Details  Name: David Joseph MRN: [id number] Date of Birth: 10-29-1940  Today's Date: 04/05/2022 PT Individual Time: 1100-1200 PT Individual Time Calculation (min): 60 min   Short Term Goals: Week 2:  PT Short Term Goal 1 (Week 2): STG=LTG secondary to ELOS  Skilled Therapeutic Interventions/Progress Updates:  Patient greeted sitting upright in PWC in room and agreeable to PT treatment session. Patient propelled PWC to/from his room and day room gym with distant supv- Patient continues to require education regarding various settings on his control panel on the PWC.   Patient properly setup the wheelchair in order perform a pop-over/squat pivot transfer to/from [facility] and mat table in order to improve independence at home- Patient performed x3 to/from the R side and x3 to/from the L side with SBA/CGA for safety. When transferring back to the wheelchair, patient was required to go uphill which was notably more challenging than downhill. VC/TC and education/demonstration for proper technique in order to improve bottom clearance. Patient required various rest breaks throughout secondary to fatigue.   Patient then performed a squat pivot transfer to/from Nustep and PWC with SBA- Patient Nustep 2 x 5 minutes on level 7 with B UE and L LE in order to improve global strength. Patient required a 3 minute rest break in between sets secondary to fatigue.   Patient left sitting in PWC in room with call bell within reach, tray table in front and all needs met.    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   Therapy/Group: Individual Therapy  Dorreen Valiente 04/05/2022, 8:02 AM

## 2022-04-05 NOTE — Progress Notes (Signed)
Physical Therapy Session Note  Patient Details  Name: CANDON CARAS MRN: [id number] Date of Birth: Apr 16, 1940  Today's Date: 04/05/2022 PT Individual Time: 0905-1005 PT Individual Time Calculation (min): 60 min   Short Term Goals: Week 2:  PT Short Term Goal 1 (Week 2): STG=LTG secondary to ELOS  Skilled Therapeutic Interventions/Progress Updates: Pt presented in bed agreeable to therapy. Pt states pain controlled currently, premedicated with rest breaks provided as needed. Requesting to wash up as has not done so in several days. PTA agreeable to bed level bathing. Pt set up with wash basin and pt was able to doff UB clothing mod I and transferred to supine to doff boxers. Pt transitioned between sitting and supine to wash up at distant supervision to mod I level. Pt maintained SBA for dynamic reaching when obtaining washcloths from basin. Pt was able to continue this method to don clean clothes with pt requiring minA x 1 to straighten shorts to allow more space to thread. Once completed pt provided with RW and performed stand pivot transfer to [facility]. Pt noted to have fair L foot clearance on this attempt. Once transfer complete spent several minutes figuring out tilt and leg rest positioning with pt having "cheat sheet" as received this loaner chair yesterday. Pt then navigated to sink to perform oral hygiene mod I. Once completed pt navigated to day room and participated in seated residual limb therex as noted: knee extension, SLR, and hip abd/add 2 x 10. Pt navigated back to room and was able to safely navigate chair to align with bed. Pt left in PWC and end of session with call bell within reach and needs met.      Therapy Documentation Precautions:  Precautions Precautions: Fall Precaution Comments: NWB R LE Required Braces or Orthoses:  ((R) Limb guard/ (L) Off-loading shoe) Restrictions Weight Bearing Restrictions: Yes RLE Weight Bearing: Non weight bearing Other Position/Activity  Restrictions: R BKA with limb guard and L off-loading shoe with toe protection. General:   Vital Signs:   Pain: Pain Assessment Pain Score: 3  Mobility:   Locomotion :    Trunk/Postural Assessment :    Balance:   Exercises:   Other Treatments:      Therapy/Group: Individual Therapy  Marylouise Mallet 04/05/2022, 12:40 PM

## 2022-04-05 NOTE — Progress Notes (Signed)
PROGRESS NOTE   Subjective/Complaints: NO new concerns or complaints this AM. Reports he has a vascular visit scheduled a few days after his DC date.   ROS: +Hearing loss-chronic, denies CP or SOB, abd pain, n/v/d/c, HA, cough +insomnia + phantom pain +scabs in L toes  Objective:   No results found. No results for input(s): "WBC", "HGB", "HCT", "PLT" in the last 72 hours.  Recent Labs    04/03/22 0742  NA 137  K 3.6  CL 102  CO2 26  GLUCOSE 142*  BUN 19  CREATININE 0.72  CALCIUM 8.7*     Intake/Output Summary (Last 24 hours) at 04/05/2022 1240 Last data filed at 04/05/2022 0740 Gross per 24 hour  Intake 720 ml  Output 1000 ml  Net -280 ml         Physical Exam: Vital Signs Blood pressure 116/60, pulse 65, temperature 97.7 F (36.5 C), resp. rate 16, height '5\' 8"'  (1.727 m), weight 60.2 kg, SpO2 98 %.  Gen: no distress, normal appearing, in w/c HEENT: oral mucosa pink and moist, NCAT, conjugate gaze Cardio: RRR Pulm: CTAB, normal effort, normal rate of breathing Abd: soft, non-tender, non-distended, +BS Ext: trace LLE edema mid-calf to ankle Limb protector in place RLE Psych: Pt's affect is appropriate. Pt is cooperative Skin: Scabs on L toes- unchanged from yesterday   Neuro:   Mental Status: He is oriented to person, place, and time. Follows commands, makes eye contact, cn 2-12 grossly intact    Comments: Decreased to light touch from mid thighs down B/L  Musculoskeletal:     Cervical back: Neck supple. No tenderness.     Comments: UE strength 4+/5 B/L R HF and KE 4/5- limited by pain LLE_ HF 3-/5; KE 3+/5; DF 4/5; PF 4-/5  L olecranon bursitis- foam dressing in place on elbow BKA incision appears to be healing well               Assessment/Plan: 1. Functional deficits which require 3+ hours per day of interdisciplinary therapy in a comprehensive inpatient rehab setting. Physiatrist  is providing close team supervision and 24 hour management of active medical problems listed below. Physiatrist and rehab team continue to assess barriers to discharge/monitor patient progress toward functional and medical goals  Care Tool:  Bathing  Bathing activity did not occur:  (Pt choosing to do simple spongebath due to increased fatigue.) Body parts bathed by patient: Right arm, Left arm, Chest, Abdomen, Front perineal area, Right upper leg, Left upper leg, Face   Body parts bathed by helper: Buttocks Body parts n/a: Right lower leg, Left lower leg   Bathing assist Assist Level: Minimal Assistance - Patient > 75%     Upper Body Dressing/Undressing Upper body dressing   What is the patient wearing?: Pull over shirt    Upper body assist Assist Level: Set up assist    Lower Body Dressing/Undressing Lower body dressing      What is the patient wearing?: Underwear/pull up, Pants     Lower body assist Assist for lower body dressing: Minimal Assistance - Patient > 75%     Toileting Toileting Toileting Activity did not occur Landscape architect  and hygiene only): N/A (no void or bm)  Toileting assist Assist for toileting: Minimal Assistance - Patient > 75%     Transfers Chair/bed transfer  Transfers assist     Chair/bed transfer assist level: Moderate Assistance - Patient 50 - 74%     Locomotion Ambulation   Ambulation assist   Ambulation activity did not occur: Safety/medical concerns (Unable to assess gait or stair mobility at this time secondary to recent R BKA with significant weakness and poor endurance/standing tolerance)          Walk 10 feet activity   Assist  Walk 10 feet activity did not occur: Safety/medical concerns        Walk 50 feet activity   Assist Walk 50 feet with 2 turns activity did not occur: Safety/medical concerns         Walk 150 feet activity   Assist Walk 150 feet activity did not occur: Safety/medical  concerns         Walk 10 feet on uneven surface  activity   Assist Walk 10 feet on uneven surfaces activity did not occur: Safety/medical concerns         Wheelchair     Assist Is the patient using a wheelchair?: Yes Type of Wheelchair: Manual    Wheelchair assist level: Supervision/Verbal cueing Max wheelchair distance: 150'    Wheelchair 50 feet with 2 turns activity    Assist        Assist Level: Supervision/Verbal cueing   Wheelchair 150 feet activity     Assist      Assist Level: Supervision/Verbal cueing   Blood pressure 116/60, pulse 65, temperature 97.7 F (36.5 C), resp. rate 16, height '5\' 8"'  (1.727 m), weight 60.2 kg, SpO2 98 %.   Medical Problem List and Plan: 1. 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  Antithrombotics: -DVT/anticoagulation:  Pharmaceutical: Eliquis 63m BID             -antiplatelet therapy: N/A 3. Pain Management:  Oxycodone prn. Voltaren 4g QID PRN. Robaxin 5021mq6h PRN. Tramadol 5071m6h PRN. -Lyrica TID-->75 mg, 50 mg 75 mg. Might benefit from increasing Lyrica due to nerve pain on B/L LE's AND phantom pain  -Pt reports nerve pain and phantom pain under control, will continue current medications and monitor   -1/8 Increase lyrica to 42m32mD  -1/9 reports pain controlled continue to monitor  -1/11 Increase lyrica to 100mg41m  -1/19 controlled 4. Insomnia: continue trazodone prn 5. Neuropsych/cognition: This patient is capable of making decisions on his own behalf. 6. Skin/Wound Care: Routine pressure relief measures.             -monitor incision for healing.             -protein supplement to promote wound healing.  7. Fluids/Electrolytes/Nutrition: Monitor I/O. F/up weekly labs  (next 04/01/22)             -add protein supplement.  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  Home medication is glipizide 2.5mg d10my             -  will check A1C in am.              -continue to hold glipizide and resume if elevated.   -1/6 CBGs stable, continue to monitor trend  -1/7: glipizide 2.2m resumed  -1/8 monitor for response to medication change  -1/17 intermittently elevated, hesitant to increase glipizide due to concern of hypoglycemia, will change to metformin  1/19 stable, continue metformin  CBG (last 3)  Recent Labs    04/04/22 2100 04/05/22 0610 04/05/22 1214  GLUCAP 136* 93 196*     9. Afib w/RVR: Diagnosed 11/2021 s/p ablation 02/12/22 by Dr. ACandis Musa  -Monitor HR TID.              -Continue metoprolol 251mBID and Eliquis. -03/31/22 HR stable, continue to monitor  10. Persistent hyponatremia: Recheck Na level in am.   -1/5 Salt tabs started, recheck BMP tomorrow  -1/8 Na improved to 134   -1/17 stable at 137 11. IgA Lambda MGUS w/7% sideroblasts : Thrombocytopenia stable.   -was getting transfusion PTA due to illness.  -monitor for any signs of bleeding.  12. Acute on chronic anemia: Improved post transfusion w/one unit PRBC up to 8.8 from 6.7 yesterday.              -1/15 stable HGB at 8.6 13. Congestive HFrEF: Heart healthy diet. Continue Lipitor 8085mD and metoprolol XR 28m36mD. Daily weight. 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  16. Loose  stools- have stopped pt's scheduled bowel meds- isn't on ABX- might Benefit from fiber if things don't improve.  -1/5 reports improved, continue to monitor -03/31/22 adequate bowel regimen, continue and monitor 17. Hypotensive: will maintain lopressor given afib, continue to monitor BP -03/31/22 BP still relatively stable, continue lopressor and monitor -1/16 decrease lopressor dose to 12.5 -1/19 BP stable    04/05/2022    4:45 AM 04/04/2022    7:33 PM 04/04/2022    1:19 PM  Vitals with BMI  Weight 132 lbs 11 oz    BMI 20.18 16.10stolic 116 [id number] 60 56 44  Pulse 65 73 72    18. Hypokalemia  -1/8 Kdur 40meq 47my, recheck tomorrow  -1/9 K+ improved to 3.6  -Monitor on weekly labs, next 04/01/22  -1/15 Kdur 30meq t61m, start 10meq ev14mother day  1/17 will increase kdur to 20meq as 53mmproved to 3.6 but is still suboptimal  Recheck monday 19. L foot scabs.  Hx of PVD -Keep dry open to air. F/u with vascular after discharge.  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 L olecranon bursitis  -Replaced foam dressing, voltaren prn 21. Insomnia   -1/17 not improved with trazodone 50mg, will55m melatonin PRN  -1/18 improved continue current medications     LOS: 15 days A FACE TO FACE EVALUATION WAS PERFORMED  Sundus Pete ShtridJennye Boroughs  04/05/2022, 12:40 PM

## 2022-04-05 NOTE — Progress Notes (Signed)
Resting during shift, denies need for sedative medications,prn medicated x1 rating discomfort at 4-5/10 left surgical toe, monitor and assisted up to [facility], Continue regime and monitoring., Anticipates discharge 04/08/22.

## 2022-04-06 DIAGNOSIS — K5900 Constipation, unspecified: Secondary | ICD-10-CM

## 2022-04-06 DIAGNOSIS — S88111D Complete traumatic amputation at level between knee and ankle, right lower leg, subsequent encounter: Secondary | ICD-10-CM | POA: Diagnosis not present

## 2022-04-06 LAB — GLUCOSE, CAPILLARY
Glucose-Capillary: 100 mg/dL — ABNORMAL HIGH (ref 70–99)
Glucose-Capillary: 148 mg/dL — ABNORMAL HIGH (ref 70–99)
Glucose-Capillary: 205 mg/dL — ABNORMAL HIGH (ref 70–99)
Glucose-Capillary: 112 mg/dL — ABNORMAL HIGH (ref 70–99)

## 2022-04-06 MED ORDER — DOCUSATE SODIUM 100 MG PO CAPS
100.0000 mg | ORAL_CAPSULE | Freq: Two times a day (BID) | ORAL | Status: DC
  Administered 2022-04-06 – 2022-04-08 (×3): 100 mg via ORAL
  Filled 2022-04-06 (×5): qty 1

## 2022-04-06 MED ORDER — POLYETHYLENE GLYCOL 3350 17 G PO PACK
17.0000 g | PACK | Freq: Every day | ORAL | Status: DC
  Administered 2022-04-06 – 2022-04-07 (×2): 17 g via ORAL
  Filled 2022-04-06 (×3): qty 1

## 2022-04-06 NOTE — Progress Notes (Signed)
Physical Therapy Session Note  Patient Details  Name: David Joseph MRN: [id number] Date of Birth: May 02, 1940  Today's Date: 04/06/2022 PT Individual Time: 1100-1200 PT Individual Time Calculation (min): 60 min   Short Term Goals: Week 2:  PT Short Term Goal 1 (Week 2): STG=LTG secondary to ELOS  Skilled Therapeutic Interventions/Progress Updates:  Patient greeted sitting upright in PWC in room with spouse, Okey Regal, and friend, Claris Che, present for hands-on family training. Spouse and patient donned Limb Guard and L surgical shoe prior to treatment session. Patient propelled PWC ModI throughout fourth floor through the hallway, doorways and over various surfaces (I.e. ramp). Patient practiced a car transfer with PWC via squat pivot and stand pivot with RW and CGA/SBA from spouse for safety- Therapist educated patient and spouse regarding improved positioning, etc. Patient then performed squat pivot with SBA to/from wheelchair and 25" tall mat table in order to simulate transferring into/out of bed discussing proper set-up in order to improve overall independence. Spouse practiced controlling the PWC and going through all the features in order to ensure understanding with good improvements noted with increased repetition. Discussed in detail discharge plan along with PWC being delivered to the home, need for new parts, ramp set-up, home safety, positioning in car for drive home, shrinker and residual limb education, etc. All questions were answered in detail throughout treatment session in order to ensure safety upon discharge home.    Therapy Documentation Precautions:  Precautions Precautions: Fall Precaution Comments: NWB R LE Required Braces or Orthoses:  ((R) Limb guard/ (L) Off-loading shoe) Restrictions Weight Bearing Restrictions: Yes RLE Weight Bearing: Non weight bearing Other Position/Activity Restrictions: R BKA with limb guard and L off-loading shoe with toe  protection.   Therapy/Group: Individual Therapy  Latasha Puskas 04/06/2022, 7:52 AM

## 2022-04-06 NOTE — Progress Notes (Signed)
PROGRESS NOTE   Subjective/Complaints: Doing pretty well today, pain is controlled, sleeping pretty good, had a BM yesterday. Isn't having BMs like he usually does, here he's been having them every 3 days, so he wants to try to improve on that before he leaves. Denies any other complaints or concerns.   ROS: +Hearing loss-chronic, +insomnia-improved, +phantom pain-improving, +scabs on L toes-improving. +Constipation. Denies CP or SOB, abd pain, n/v/d, HA, cough, new/worsening numbness/tingling/weakness.    Objective:   No results found. No results for input(s): "WBC", "HGB", "HCT", "PLT" in the last 72 hours.  Recent Labs    04/03/22 0742  NA 137  K 3.6  CL 102  CO2 26  GLUCOSE 142*  BUN 19  CREATININE 0.72  CALCIUM 8.7*     Intake/Output Summary (Last 24 hours) at 04/06/2022 0703 Last data filed at 04/06/2022 0606 Gross per 24 hour  Intake 720 ml  Output 2575 ml  Net -1855 ml         Physical Exam: Vital Signs Blood pressure 127/60, pulse 65, temperature 98.2 F (36.8 C), resp. rate 16, height '5\' 8"'  (1.727 m), weight 60.8 kg, SpO2 100 %.  Gen: no distress, normal appearing, in w/c HEENT: oral mucosa pink and moist, NCAT, conjugate gaze Cardio: RRR Pulm: CTAB, normal effort, normal rate of breathing Abd: soft, non-tender, non-distended, +BS Ext: trace LLE edema mid-calf to ankle, improved from last week Limb protector in place RLE Psych: Pt's affect is appropriate. Pt is cooperative Skin: Scabs on L toes- improving   Neuro:   Mental Status: He is oriented to person, place, and time. Follows commands, makes eye contact, cn 2-12 grossly intact    Comments: Decreased to light touch from mid thighs down B/L  Musculoskeletal:     Cervical back: Neck supple. No tenderness.     Comments: UE strength 4+/5 B/L R HF and KE 4/5- limited by pain LLE_ HF 3-/5; KE 3+/5; DF 4/5; PF 4-/5  L olecranon bursitis- foam  dressing in place on elbow BKA incision appears to be healing well               Assessment/Plan: 1. Functional deficits which require 3+ hours per day of interdisciplinary therapy in a comprehensive inpatient rehab setting. Physiatrist is providing close team supervision and 24 hour management of active medical problems listed below. Physiatrist and rehab team continue to assess barriers to discharge/monitor patient progress toward functional and medical goals  Care Tool:  Bathing  Bathing activity did not occur:  (Pt choosing to do simple spongebath due to increased fatigue.) Body parts bathed by patient: Right arm, Left arm, Chest, Abdomen, Front perineal area, Right upper leg, Left upper leg, Face   Body parts bathed by helper: Buttocks Body parts n/a: Right lower leg, Left lower leg   Bathing assist Assist Level: Minimal Assistance - Patient > 75%     Upper Body Dressing/Undressing Upper body dressing   What is the patient wearing?: Pull over shirt    Upper body assist Assist Level: Set up assist    Lower Body Dressing/Undressing Lower body dressing      What is the patient wearing?: Underwear/pull up,  Pants     Lower body assist Assist for lower body dressing: Minimal Assistance - Patient > 75%     Toileting Toileting Toileting Activity did not occur (Clothing management and hygiene only): N/A (no void or bm)  Toileting assist Assist for toileting: Minimal Assistance - Patient > 75%     Transfers Chair/bed transfer  Transfers assist     Chair/bed transfer assist level: Moderate Assistance - Patient 50 - 74%     Locomotion Ambulation   Ambulation assist   Ambulation activity did not occur: Safety/medical concerns (Unable to assess gait or stair mobility at this time secondary to recent R BKA with significant weakness and poor endurance/standing tolerance)          Walk 10 feet activity   Assist  Walk 10 feet activity did not occur:  Safety/medical concerns        Walk 50 feet activity   Assist Walk 50 feet with 2 turns activity did not occur: Safety/medical concerns         Walk 150 feet activity   Assist Walk 150 feet activity did not occur: Safety/medical concerns         Walk 10 feet on uneven surface  activity   Assist Walk 10 feet on uneven surfaces activity did not occur: Safety/medical concerns         Wheelchair     Assist Is the patient using a wheelchair?: Yes Type of Wheelchair: Manual    Wheelchair assist level: Supervision/Verbal cueing Max wheelchair distance: 150'    Wheelchair 50 feet with 2 turns activity    Assist        Assist Level: Supervision/Verbal cueing   Wheelchair 150 feet activity     Assist      Assist Level: Supervision/Verbal cueing   Blood pressure 127/60, pulse 65, temperature 98.2 F (36.8 C), resp. rate 16, height '5\' 8"'  (1.727 m), weight 60.8 kg, SpO2 100 %.   Medical Problem List and Plan: 1. 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  Antithrombotics: -DVT/anticoagulation:  Pharmaceutical: Eliquis 10m BID             -antiplatelet therapy: N/A  3. Pain Management:  Oxycodone prn. Voltaren 4g QID PRN. Robaxin 505mq6h PRN. Tramadol 5076m6h PRN. -Lyrica TID-->75 mg, 50 mg 75 mg. Might benefit from increasing Lyrica due to nerve pain on B/L LE's AND phantom pain  -Pt reports nerve pain and phantom pain under control, will continue current medications and monitor   -1/8 Increase lyrica to 29m20mD  -1/9 reports pain controlled continue to monitor  -1/11 Increase lyrica to 100mg72m  -04/06/22 well controlled, continue regimen  4. Insomnia: continue trazodone prn  -1/17 not improved with trazodone  50mg,71ml try melatonin 5mg PR70m-04/06/22 improved continue current medications  5. Neuropsych/cognition: This patient is capable of making decisions on his own behalf.  6. Skin/Wound Care: Routine pressure relief measures.             -monitor incision for healing.             -protein supplement to promote wound  healing.   7. Fluids/Electrolytes/Nutrition: Monitor I/O. F/up weekly labs (next 04/08/22)             -add protein supplement. Vitamins.   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  Home medication is glipizide 2.80m daily             -will check A1C in am.              -continue to hold glipizide and resume if elevated.   -1/6 CBGs stable, continue to monitor trend  -1/7: glipizide 2.515mresumed  -1/8 monitor for response to medication change -1/17 intermittently elevated, hesitant to increase glipizide due to concern of hypoglycemia, will change to metformin 50022mD  -04/06/22 stable, continue regimen  CBG (last 3)  Recent Labs    04/05/22 1653 04/05/22 2109 04/06/22 0559  GLUCAP 171* 166* 100*    9. Afib w/RVR: Diagnosed 11/2021 s/p ablation 02/12/22 by Dr. AssCandis Musa-Monitor HR TID.              -Continue metoprolol 27m67mD and Eliquis. -04/06/22 HR stable, continue to monitor   10. Persistent hyponatremia: Recheck Na level in am.   -1/5 Salt tabs started, recheck BMP tomorrow  -1/8 Na improved to 134   -1/17 stable at 137  -04/06/22 monitor on weekly labs (04/08/22)  11. IgA Lambda MGUS w/7% sideroblasts : Thrombocytopenia stable.   -was getting transfusion PTA due to illness.  -monitor for any signs of bleeding.   12. Acute on chronic anemia: Improved post transfusion w/one unit PRBC up to 8.8 from 6.7 yesterday.              -1/15 stable HGB at 8.6, monitor on weekly labs (04/08/22)  13. Congestive HFrEF: Heart healthy diet. Continue Lipitor 80mg93mand metoprolol XR 27mg 42m Daily weight. 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  -Replaced foam dressing, voltaren prn  16. Loose stools- have stopped pt's scheduled bowel meds- isn't on ABX- might Benefit from fiber if things don't improve.  -1/5 reports improved, continue to monitor -03/31/22 adequate bowel regimen, continue and monitor -04/06/22 now having constipation; see #20 below  17. Hypotensive: will maintain lopressor given afib, continue to monitor BP -03/31/22 BP still relatively stable, continue lopressor and monitor -1/16 decrease lopressor dose to 12.5 -04/06/22 BP stable, continue to monitor Vitals:   04/02/22 0554 04/02/22 0754 04/02/22 1346 04/02/22 1937  BP: (!) 102/40 (!) 96/44 (!) 99/44 (!) 121/55   04/03/22 0432 04/04/22 0330 04/04/22 1319 04/04/22 1933  BP: 130/64 (!) 106/51 (!) 95/44 (!) 107/56   04/05/22 0445 04/05/22 1417 04/05/22 2015 04/06/22 0523  BP: 116/60 (!) 103/45 (!) 111/47 127/60     18. Hypokalemia  -1/8 Kdur 40meq to47m recheck tomorrow  -1/9 K+ improved to 3.6  -Monitor on weekly labs, next 04/01/22  -1/15 Kdur 30meq tod50mstart 10meq ever8mher day -1/17 will increase kdur to 20meq as K+74mroved to 3.6 but is still  suboptimal  -04/06/22 Recheck Monday 04/08/22  19. L foot scabs.  Hx of PVD -Keep dry open to air. F/u with vascular after discharge.  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 Constipation: having BMs q3days here -04/06/22 will add back Colace 151m BID + Miralax 17g QD, still has PRNs (Miralax, Dulcolax supp, and Fleet's enema) also, monitor for effect     LOS: 16 days A FACE TO FHorry1/20/2024, 7:03 AM

## 2022-04-06 NOTE — Progress Notes (Signed)
Slept good. No PRN meds requested. Using urinal at HS. 2 plus pitting edema to LLE, especially foot. Scabs to 2nd & 3rd toe on left foot, no drainage. Right BKA with staples, redness and bruising noted-stump shrinker in place. Alfredo Martinez A

## 2022-04-07 DIAGNOSIS — K5900 Constipation, unspecified: Secondary | ICD-10-CM | POA: Diagnosis not present

## 2022-04-07 DIAGNOSIS — S88111D Complete traumatic amputation at level between knee and ankle, right lower leg, subsequent encounter: Secondary | ICD-10-CM | POA: Diagnosis not present

## 2022-04-07 LAB — GLUCOSE, CAPILLARY
Glucose-Capillary: 122 mg/dL — ABNORMAL HIGH (ref 70–99)
Glucose-Capillary: 231 mg/dL — ABNORMAL HIGH (ref 70–99)
Glucose-Capillary: 152 mg/dL — ABNORMAL HIGH (ref 70–99)

## 2022-04-07 NOTE — Plan of Care (Signed)
  Problem: Sit to Stand Goal: LTG:  Patient will perform sit to stand in prep for activites of daily living with assistance level (OT) Description: LTG:  Patient will perform sit to stand in prep for activites of daily living with assistance level (OT) Outcome: Not Met (add Reason) Note: Pt requires intermediate CGA due to fluctuating levels of fatigue/pain.   Problem: RH Bathing Goal: LTG Patient will bathe all body parts with assist levels (OT) Description: LTG: Patient will bathe all body parts with assist levels (OT) Outcome: Completed/Met   Problem: RH Dressing Goal: LTG Patient will perform lower body dressing w/assist (OT) Description: LTG: Patient will perform lower body dressing with assist, with/without cues in positioning using equipment (OT) Outcome: Completed/Met   Problem: RH Toileting Goal: LTG Patient will perform toileting task (3/3 steps) with assistance level (OT) Description: LTG: Patient will perform toileting task (3/3 steps) with assistance level (OT)  Outcome: Not Met (add Reason) Note: Pt requires intermediate CGA due to fluctuating levels of fatigue/pain.   Problem: RH Toilet Transfers Goal: LTG Patient will perform toilet transfers w/assist (OT) Description: LTG: Patient will perform toilet transfers with assist, with/without cues using equipment (OT) Outcome: Completed/Met Note: Using squat pivot method.   Problem: RH Tub/Shower Transfers Goal: LTG Patient will perform tub/shower transfers w/assist (OT) Description: LTG: Patient will perform tub/shower transfers with assist, with/without cues using equipment (OT) Outcome: Completed/Met Note: Using squat pivot method.

## 2022-04-07 NOTE — Progress Notes (Signed)
Occupational Therapy Discharge Summary  Patient Details  Name: David Joseph MRN: [id number] Date of Birth: 1940-07-09  Date of Discharge from OT service:April 07, 2022  Today's Date: 04/07/2022 OT Individual Time: 0906-1008 OT Individual Time Calculation (min): 62 min    Patient has met 4 of 6 long term goals due to improved activity tolerance, improved balance, and ability to compensate for deficits.  Patient to discharge at overall Supervision-CGA level.  Patient's care partner is independent to provide the necessary physical assistance at discharge.    Reasons goals not met: Pt discharging with need for intermediate CGA for STS/stand-step transfers and standing balance dependent upon fatigue levels and generalized pain in both UE/LE.   Recommendation:  Patient will benefit from ongoing skilled OT services in home health setting to continue to advance functional skills in the area of BADL and Reduce care partner burden.  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 ADL ADL Equipment Provided: Long-handled sponge, Upper extremity splints (Wrist brace) Eating: Modified independent Where Assessed-Eating: Wheelchair Grooming: Modified independent Where Assessed-Grooming: Wheelchair Upper Body Bathing: Modified independent Where Assessed-Upper Body Bathing: Shower Lower Body Bathing: Minimal assistance Where Assessed-Lower Body Bathing: Shower Upper Body Dressing: Modified independent (Device) Where Assessed-Upper Body  Dressing: Edge of bed Lower Body Dressing: Modified independent Where Assessed-Lower Body Dressing: Bed level Toileting: Contact guard Where Assessed-Toileting: Glass blower/designer: Close supervision Toilet Transfer Method: Engineer, water: Grab bars, Geophysical data processor: Close supervision Social research officer, government Method: Education officer, environmental: Grab bars, Gaffer Baseline Vision/History: 1 Wears glasses (Readers) Patient Visual Report: No change from baseline Perception  Perception: Within Functional Limits Praxis Praxis: Intact Cognition Cognition Overall Cognitive Status: Within Functional Limits for tasks assessed Arousal/Alertness: Awake/alert Orientation Level: Person;Place;Situation Memory: Impaired Memory Impairment: Decreased recall of new information Attention: Selective Awareness: Appears intact Problem Solving: Appears intact Safety/Judgment: Appears intact Brief Interview for Mental Status (BIMS) Repetition of Three Words (First Attempt): 3 Temporal Orientation: Year: Correct Temporal Orientation: Month: Accurate within 5 days Temporal Orientation: Day: Correct Recall: "Sock": Yes, no cue required Recall: "Blue": Yes, no cue required Recall: "Bed": Yes, no cue required BIMS Summary Score: 15 Sensation Sensation Light Touch: Impaired Detail Peripheral sensation comments: 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 Mobility  Bed Mobility Supine to Sit: Independent with assistive  device Sit to Supine: Independent with assistive device Transfers Sit to Stand: Supervision/Verbal cueing Stand to Sit: Supervision/Verbal cueing  Trunk/Postural Assessment  Cervical Assessment Cervical Assessment: Exceptions to [facility] (forward head) Thoracic Assessment Thoracic Assessment: Exceptions to [facility] (rounded shoulders) Lumbar Assessment Lumbar Assessment: Exceptions to [facility] (post pelvic tilt although appears improved w/ cushion.) Postural Control Postural Control: Within Functional Limits  Balance Balance Balance Assessed: Yes Static Sitting Balance  Static Sitting - Balance Support: No upper extremity supported Static Sitting - Level of Assistance: 6: Modified independent (Device/Increase time) Dynamic Sitting Balance Dynamic Sitting - Balance Support: During functional activity Dynamic Sitting - Level of Assistance: 5: Stand by assistance Dynamic Sitting - Balance Activities: Forward lean/weight shifting Static Standing Balance Static Standing - Balance Support: Bilateral upper extremity supported Static Standing - Level of Assistance: 5: Stand by assistance Extremity/Trunk Assessment RUE Assessment RUE Assessment: Exceptions to [facility] RUE Strength RUE Overall Strength: Deficits LUE Assessment LUE Assessment: Exceptions to [facility] LUE Strength LUE Overall Strength: Deficits  Skilled Intervention: Pt received resting in bed for skilled OT session with focus on discharge planning and UE strengthening/HEP. Pt agreeable to interventions, demonstrating overall pleasant mood. Pt with fluctuating pain. OT offering intermediate rest breaks and positioning suggestions throughout session to address pain/fatigue and maximize participation/safety in session.   Pt requesting education on residual limb covering for purpose of showering/bathing. Small amount of needed supplies provided with needed details for future purchasing provided. Pt dons sweat pants with increased time, OT and pt  discuss best clothing fabrics for increased independence/efficiency at home. Pt then dons limb guard/sleeve with increased frustration due to velcro, but overall completing with supervision. Pt requires Min A for material management to don offloading shoe. Pt performs stand-step transfer from EOB>WC with CGA + RW, prematurely sitting despite cuing.   Pt drives PWC from room<>day room independently, performing squat pivot onto EOM with supervision. In day room, pt participates in series of UE strengthening exercises:  -Overhead Ab/adduction -Chest-level Ab/adduction -Shoulder Flexion/Extension -Diagonal Ab/adduction  -Tricep Extensions -Bicep curls -Punches   Pt completes 1 set/10 reps of each exercise with red theraband, requiring multi-modal cuing for correct form. HEP issued with yellow/red therabands provided.  Pt remained seated in [facility] with all immediate needs met at end of session. Pt continues to be appropriate for skilled OT intervention to promote further functional independence.   Lou Cal, OTR/L, MSOT  04/07/2022, 5:46 PM

## 2022-04-07 NOTE — Progress Notes (Signed)
+/-  sleep. Mostly complaining of "burning" pain to left foot. +2 pitting edema to LLE, especially to foot. PRN Oxy ir 10mg 's given at 0031 & 0504, rating pain 7 or 8. Stump shrinker to Right BKA changed this morning.  Shrinker stuck to incision in a few places, because of dried drainage. New picture placed in chart. Kerlix, then shrinker applied to right BKA. 2nd & 3rd toe on left foot with dark scabs. Folded kerlix weaved in between toes on left foot. 0505 A

## 2022-04-07 NOTE — Progress Notes (Signed)
PROGRESS NOTE   Subjective/Complaints: Doing pretty well again today, pain is mostly controlled (still needing oxycodone which surprises him but he has good effect when he gets it), sleeping decently, had a BM yesterday. Urinating well.  Isn't concerned with his BPs, states he "runs low at home" and feels totally fine, denies any dizziness or other symptoms.  Excited to go home soon. Wants to know when his ride needs to be here and what meds he will go home on-- discussed that weekday team will review all of this.  Denies any other complaints or concerns.  ROS: +Hearing loss-chronic, +insomnia-improved, +phantom pain-improving, +scabs on L toes-improving. +Constipation-improving. Denies dizziness/lightheadedness, CP or SOB, abd pain, n/v/d, HA, cough, new/worsening numbness/tingling/weakness.    Objective:   No results found. No results for input(s): "WBC", "HGB", "HCT", "PLT" in the last 72 hours.  No results for input(s): "NA", "K", "CL", "CO2", "GLUCOSE", "BUN", "CREATININE", "CALCIUM" in the last 72 hours.   Intake/Output Summary (Last 24 hours) at 04/07/2022 0716 Last data filed at 04/07/2022 0405 Gross per 24 hour  Intake 940 ml  Output 1650 ml  Net -710 ml         Physical Exam: Vital Signs Blood pressure (!) 111/49, pulse 67, temperature 98 F (36.7 C), temperature source Oral, resp. rate 18, height '5\' 8"'  (1.727 m), weight 60.3 kg, SpO2 100 %.  Gen: no distress, normal appearing, in bed sitting upright HEENT: oral mucosa pink and moist, NCAT, conjugate gaze Cardio: RRR Pulm: CTAB, normal effort, normal rate of breathing Abd: soft, non-tender, non-distended, +BS Ext: trace LLE edema mid-calf to ankle, improved from last week Limb protector in place RLE Psych: Pt's affect is appropriate. Pt is cooperative Skin: Scabs on L toes- improving. R BKA picture updated below   Neuro:   Mental Status: He is oriented to  person, place, and time. Follows commands, makes eye contact, cn 2-12 grossly intact    Comments: Decreased to light touch from mid thighs down B/L  Musculoskeletal:     Cervical back: Neck supple. No tenderness.     Comments: UE strength 4+/5 B/L R HF and KE 4/5- limited by pain LLE_ HF 3-/5; KE 3+/5; DF 4/5; PF 4-/5  L olecranon bursitis- foam dressing in place on elbow BKA incision appears to be healing well   04/05/22       04/07/22      Assessment/Plan: 1. Functional deficits which require 3+ hours per day of interdisciplinary therapy in a comprehensive inpatient rehab setting. Physiatrist is providing close team supervision and 24 hour management of active medical problems listed below. Physiatrist and rehab team continue to assess barriers to discharge/monitor patient progress toward functional and medical goals  Care Tool:  Bathing  Bathing activity did not occur:  (Pt choosing to do simple spongebath due to increased fatigue.) Body parts bathed by patient: Right arm, Left arm, Chest, Abdomen, Front perineal area, Right upper leg, Left upper leg, Face   Body parts bathed by helper: Buttocks Body parts n/a: Right lower leg, Left lower leg   Bathing assist Assist Level: Minimal Assistance - Patient > 75%     Upper Body Dressing/Undressing Upper body  dressing   What is the patient wearing?: Pull over shirt    Upper body assist Assist Level: Set up assist    Lower Body Dressing/Undressing Lower body dressing      What is the patient wearing?: Underwear/pull up, Pants     Lower body assist Assist for lower body dressing: Minimal Assistance - Patient > 75%     Toileting Toileting Toileting Activity did not occur (Clothing management and hygiene only): N/A (no void or bm)  Toileting assist Assist for toileting: Minimal Assistance - Patient > 75%     Transfers Chair/bed transfer  Transfers assist     Chair/bed transfer assist level: Moderate  Assistance - Patient 50 - 74%     Locomotion Ambulation   Ambulation assist   Ambulation activity did not occur: Safety/medical concerns (Unable to assess gait or stair mobility at this time secondary to recent R BKA with significant weakness and poor endurance/standing tolerance)          Walk 10 feet activity   Assist  Walk 10 feet activity did not occur: Safety/medical concerns        Walk 50 feet activity   Assist Walk 50 feet with 2 turns activity did not occur: Safety/medical concerns         Walk 150 feet activity   Assist Walk 150 feet activity did not occur: Safety/medical concerns         Walk 10 feet on uneven surface  activity   Assist Walk 10 feet on uneven surfaces activity did not occur: Safety/medical concerns         Wheelchair     Assist Is the patient using a wheelchair?: Yes Type of Wheelchair: Manual    Wheelchair assist level: Supervision/Verbal cueing Max wheelchair distance: 150'    Wheelchair 50 feet with 2 turns activity    Assist        Assist Level: Supervision/Verbal cueing   Wheelchair 150 feet activity     Assist      Assist Level: Supervision/Verbal cueing   Blood pressure (!) 111/49, pulse 67, temperature 98 F (36.7 C), temperature source Oral, resp. rate 18, height '5\' 8"'  (1.727 m), weight 60.3 kg, SpO2 100 %.   Medical Problem List and Plan: 1. 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  Antithrombotics: -DVT/anticoagulation:  Pharmaceutical: Eliquis 72m BID             -antiplatelet therapy: N/A  3. Pain Management:  Oxycodone prn. Voltaren 4g QID PRN. Robaxin 5083mq6h PRN. Tramadol 5060m6h PRN. -Lyrica TID-->75 mg, 50 mg 75 mg. Might benefit from  increasing Lyrica due to nerve pain on B/L LE's AND phantom pain  -Pt reports nerve pain and phantom pain under control, will continue current medications and monitor   -1/8 Increase lyrica to 62m44mD  -1/9 reports pain controlled continue to monitor  -1/11 Increase lyrica to 100mg24m  -04/07/22 well controlled, continue regimen  4. Insomnia: continue trazodone prn  -1/17 not improved with trazodone 50mg,61ml try melatonin 5mg PR30m-04/07/22 improved, continue current medications  5. Neuropsych/cognition: This patient is capable of  making decisions on his own behalf.  6. Skin/Wound Care: Routine pressure relief measures.             -monitor incision for healing.  -discuss staple removal timing before leaving             -protein supplement to promote wound healing.   7. Fluids/Electrolytes/Nutrition: Monitor I/O. F/up weekly labs (next 04/08/22)             -add protein supplement. Vitamins.   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  Home medication is glipizide 2.35m daily             -will check A1C in am.              -continue to hold glipizide and resume if elevated.   -1/6 CBGs stable, continue to monitor trend  -1/7: glipizide 2.522mresumed  -1/8 monitor for response to medication change -1/17 intermittently elevated, hesitant to increase glipizide due to concern of hypoglycemia, will change to metformin 50071mD  -04/07/22 stable, continue regimen  CBG (last 3)  Recent Labs    04/06/22 1201 04/06/22 1634 04/06/22 2133  GLUCAP 148* 205* 112*    9. Afib w/RVR: Diagnosed 11/2021 s/p ablation 02/12/22 by Dr. AssCandis Musa-Monitor HR TID.              -Continue metoprolol 30m12mD and Eliquis. -04/07/22 HR stable, continue to monitor   10. Persistent hyponatremia: Recheck Na level in am.   -1/5 Salt tabs started, recheck BMP tomorrow  -1/8 Na improved to 134   -1/17 stable at 137  -04/06/22 monitor on weekly labs (04/08/22)  11.  IgA Lambda MGUS w/7% sideroblasts : Thrombocytopenia stable.   -was getting transfusion PTA due to illness.  -monitor for any signs of bleeding.   12. Acute on chronic anemia: Improved post transfusion w/one unit PRBC up to 8.8 from 6.7 yesterday.              -1/15 stable HGB at 8.6, monitor on weekly labs (04/08/22)  13. Congestive HFrEF: Heart healthy diet. Continue Lipitor 80mg64mand metoprolol XR 30mg 60m Daily weight. 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  -Replaced foam dressing, voltaren prn  16. Loose stools- have stopped pt's scheduled bowel meds- isn't on ABX- might Benefit from fiber if things don't improve.  -1/5 reports improved, continue to monitor -03/31/22 adequate bowel regimen, continue and monitor -04/06/22 now having constipation; see #20 below  17. Hypotensive: will maintain lopressor given afib, continue to monitor BP -03/31/22 BP still relatively stable, continue lopressor and monitor -1/16 decrease lopressor dose to 12.5 -04/07/22 BP on softer side overnight but pt asymptomatic and states he's usually in this range-- monitor for now, but didn't change meds today. Vitals:   04/03/22 0432 04/04/22 0330 04/04/22 1319 04/04/22 1933  BP: 130/64 (!) 106/51 (!) 95/44 (!) 107/56   04/05/22 0445 04/05/22 1417 04/05/22 2015 04/06/22 0523  BP: 116/60 (!) 103/45 (!)Marland Kitchen  111/47 127/60   04/06/22 1328 04/06/22 1932 04/06/22 2137 04/07/22 0405  BP: (!) 93/53 (!) 97/45 (!) 114/52 (!) 111/49     18.  Hypokalemia  -1/8 Kdur 10mq today, recheck tomorrow  -1/9 K+ improved to 3.6  -Monitor on weekly labs, next 04/01/22  -1/15 Kdur 36m today, start 1079mevery other day -1/17 will increase kdur to 63m37ms K+ improved to 3.6 but is still suboptimal  -04/06/22 Recheck Monday 04/08/22  19. L foot scabs.  Hx of PVD -Keep dry open to air. F/u with vascular after discharge.  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 Constipation: having BMs q3days here -04/06/22 will add back Colace 100mg101m + Miralax 17g QD, still has PRNs (Miralax, Dulcolax supp, and Fleet's enema) also, monitor for effect -04/07/22 BM yesterday, continue current regimen.      LOS: 17 days A FACE TO FACE [place]/2024, 7:16 AM

## 2022-04-07 NOTE — Progress Notes (Signed)
Physical Therapy Discharge Summary  Patient Details  Name: David Joseph MRN: [id number] Date of Birth: [birth year]  Date of Discharge from PT service:April 07, 2022  Today's Date: 04/07/2022 PT Individual Time: 1115-1200 PT Individual Time Calculation (min): 45 min    Patient has met 4 of 5 long term goals due to improved activity tolerance, increased strength, and decreased pain.  Patient to discharge at a wheelchair level Supervision.   Patient's care partner is independent to provide the necessary cognitive assistance at discharge.  Reasons goals not met: Pt still requires CGA for sit to stand, as pt unable to push up from w/c arm rests, pushing from RW handles.  Pt able to transfer stand to sit w/ Supervision because able to perform split hand positioning w/ 1 hand on w/c seat.    Recommendation:  Patient will benefit from ongoing skilled PT services in home health setting to continue to advance safe functional mobility, address ongoing impairments in strength, endurance, and minimize fall risk.  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 Vital Signs  Pain Pain Assessment Pain Scale: 0-10 Pain Score: 0-No pain Pain Interference Pain Interference Pain Effect on Sleep: 2. Occasionally Pain Interference with Therapy Activities: 1. Rarely or not at all Pain Interference with Day-to-Day Activities: 1. Rarely or not at all Vision/Perception     Cognition Overall Cognitive Status: Within Functional Limits for tasks assessed Arousal/Alertness: Awake/alert Orientation  Level: Oriented X4 Problem Solving: Appears intact (w/ w/c mobility.) Safety/Judgment: Appears intact Sensation Sensation Light Touch: Impaired Detail Peripheral sensation comments: Sensory loss in R hand secondary to previous stroke; B LE peripheral neuropathy with impaired senstion from L1 down to his toes Light Touch Impaired Details: Impaired RUE;Impaired LLE;Impaired RLE Coordination Gross Motor Movements are Fluid and Coordinated: No Fine Motor Movements are Fluid and Coordinated: No Motor  Motor Motor - Discharge Observations: Improved strength w/ mobility although still c/o weakness B wrists.  Mobility Bed Mobility Bed Mobility: Rolling Right;Rolling Left;Supine to Sit;Sit to Supine Rolling Right: Independent with assistive device Rolling Left: Independent with assistive device Supine to Sit: Independent with assistive device Sit to Supine: Independent with assistive device Transfers Transfers: Sit to Stand;Stand to Sit;Stand Pivot Transfers;Squat Pivot Transfers Sit to Stand: Contact Guard/Touching assist;Supervision/Verbal cueing Stand Pivot Transfers: Supervision/Verbal cueing Stand Pivot Transfer Details: Verbal cues for safe use of DME/AE;Verbal cues for sequencing;Verbal cues for technique Squat Pivot Transfers: Supervision/Verbal cueing Transfer (Assistive device): None Locomotion  Gait Ambulation: No Gait Gait: No Stairs / Additional Locomotion Stairs: No Pick up small object from the floor (from standing position) activity did not occur: Safety/medical concerns Naval architect Mobility: Yes Wheelchair Assistance: Doctor, general practice: Power Wheelchair Parts Management: Needs assistance (for moving foot rest up/down, and removing SB used as stump board.) Distance: 150+  Trunk/Postural Assessment  Cervical Assessment Cervical Assessment: Exceptions to [facility] (forward head) Thoracic Assessment Thoracic Assessment:  Exceptions to [facility] (rounded shoulders) Lumbar Assessment Lumbar Assessment: Exceptions to [facility] (post pelvic tilt although appears improved w/ cushion.) Postural Control Postural Control: Within Functional Limits  Balance Balance Balance Assessed: Yes Static Sitting Balance Static Sitting - Balance Support: No upper extremity supported Static Sitting -  Level of Assistance: 6: Modified independent (Device/Increase time) Dynamic Sitting Balance Dynamic Sitting - Balance Support: During functional activity Dynamic Sitting - Level of Assistance: 5: Stand by assistance Static Standing Balance Static Standing - Balance Support: Bilateral upper extremity supported;During functional activity Static Standing - Level of Assistance: 5: Stand by assistance Extremity Assessment    Pt presents sitting in PWC reclined and agreeable to therapy.  Pt negotiates w/c in confined environment to side of bed for supervision squat pivot w/c> bed.  Pt performs all bed mobility w/ mod I from flat bed.  Pt squat pivot bed > w/c w/ supervision.  Pt does require min A for raising/lowering foot rest and placing SB used as stump board.  Pt negotiated PWC out of room on slow speed but then able to increase speed in open hallways.  Pt negotiated w/c > 150' w/ supervision to car.  Pt positions w/c for car transfer at small SUV height.  Supervision for squat pivot transfer in / out of car, occasional verbal cues for differences from real car (bucket seat and door width opening).  Pt performed sit to stand transfer w/ CGA 2/2 inability to push from w/c to stand 2/2 wrist pain/weakness, although able to use 1 hand to lower from standing.  Pt negotiated w/c on far side of bed w/ supervision only to position alongside bed.  Pt reclined w/c and all needs in reach.   RLE Assessment RLE Assessment: Within Functional Limits General Strength Comments: hip WFL LLE Assessment LLE Assessment: Within Functional Limits General Strength  Comments: grossly 4+/5-5/5     Lucio Edward 04/07/2022, 12:13 PM

## 2022-04-08 DIAGNOSIS — I739 Peripheral vascular disease, unspecified: Secondary | ICD-10-CM | POA: Diagnosis not present

## 2022-04-08 DIAGNOSIS — Z89511 Acquired absence of right leg below knee: Secondary | ICD-10-CM | POA: Diagnosis not present

## 2022-04-08 DIAGNOSIS — I959 Hypotension, unspecified: Secondary | ICD-10-CM | POA: Diagnosis not present

## 2022-04-08 DIAGNOSIS — I5022 Chronic systolic (congestive) heart failure: Secondary | ICD-10-CM | POA: Diagnosis not present

## 2022-04-08 LAB — CBC
HCT: 24.7 % — ABNORMAL LOW (ref 39.0–52.0)
Hemoglobin: 8.3 g/dL — ABNORMAL LOW (ref 13.0–17.0)
MCH: 35.9 pg — ABNORMAL HIGH (ref 26.0–34.0)
MCHC: 33.6 g/dL (ref 30.0–36.0)
MCV: 106.9 fL — ABNORMAL HIGH (ref 80.0–100.0)
Platelets: 145 10*3/uL — ABNORMAL LOW (ref 150–400)
RBC: 2.31 MIL/uL — ABNORMAL LOW (ref 4.22–5.81)
RDW: 23 % — ABNORMAL HIGH (ref 11.5–15.5)
WBC: 4.2 10*3/uL (ref 4.0–10.5)
nRBC: 0 % (ref 0.0–0.2)

## 2022-04-08 LAB — BASIC METABOLIC PANEL
Anion gap: 8 (ref 5–15)
BUN: 18 mg/dL (ref 8–23)
CO2: 25 mmol/L (ref 22–32)
Calcium: 8.3 mg/dL — ABNORMAL LOW (ref 8.9–10.3)
Chloride: 103 mmol/L (ref 98–111)
Creatinine, Ser: 0.64 mg/dL (ref 0.61–1.24)
GFR, Estimated: >60 mL/min (ref >60)
Glucose, Bld: 100 mg/dL — ABNORMAL HIGH (ref 70–99)
Potassium: 3.8 mmol/L (ref 3.5–5.1)
Sodium: 136 mmol/L (ref 135–145)

## 2022-04-08 LAB — GLUCOSE, CAPILLARY
Glucose-Capillary: 97 mg/dL (ref 70–99)
Glucose-Capillary: 103 mg/dL — ABNORMAL HIGH (ref 70–99)

## 2022-04-08 MED ORDER — SODIUM CHLORIDE 1 G PO TABS: 1.0000 g | ORAL_TABLET | Freq: Three times a day (TID) | ORAL | 0 refills | Status: DC

## 2022-04-08 MED ORDER — DOCUSATE SODIUM 100 MG PO CAPS: 100.0000 mg | ORAL_CAPSULE | Freq: Two times a day (BID) | ORAL | 0 refills | Status: DC

## 2022-04-08 MED ORDER — POTASSIUM CHLORIDE CRYS ER 20 MEQ PO TBCR: 20.0000 meq | EXTENDED_RELEASE_TABLET | Freq: Every day | ORAL | 0 refills | Status: DC

## 2022-04-08 MED ORDER — POLYETHYLENE GLYCOL 3350 17 G PO PACK: 17.0000 g | PACK | Freq: Every day | ORAL | 0 refills | Status: DC | PRN

## 2022-04-08 MED ORDER — PREGABALIN 100 MG PO CAPS: 100.0000 mg | ORAL_CAPSULE | Freq: Three times a day (TID) | ORAL | 0 refills | Status: DC

## 2022-04-08 MED ORDER — PANTOPRAZOLE SODIUM 40 MG PO TBEC: 40.0000 mg | DELAYED_RELEASE_TABLET | Freq: Every day | ORAL | 0 refills | Status: DC

## 2022-04-08 MED ORDER — ASCORBIC ACID 500 MG PO TABS: 500.0000 mg | ORAL_TABLET | Freq: Every day | ORAL | 0 refills | Status: DC

## 2022-04-08 MED ORDER — CALCIUM CITRATE 950 (200 CA) MG PO TABS: 200.0000 mg | ORAL_TABLET | Freq: Every day | ORAL | 0 refills | Status: DC

## 2022-04-08 MED ORDER — POLYSACCHARIDE IRON COMPLEX 150 MG PO CAPS: 150.0000 mg | ORAL_CAPSULE | Freq: Every day | ORAL | 1 refills | Status: DC

## 2022-04-08 MED ORDER — METOPROLOL SUCCINATE ER 25 MG PO TB24: 12.5000 mg | ORAL_TABLET | Freq: Two times a day (BID) | ORAL | 0 refills | Status: DC

## 2022-04-08 MED ORDER — ACETAMINOPHEN 325 MG PO TABS: 325.0000 mg | ORAL_TABLET | ORAL | Status: DC | PRN

## 2022-04-08 MED ORDER — PROSOURCE PLUS PO LIQD: 30.0000 mL | Freq: Two times a day (BID) | ORAL | Status: DC

## 2022-04-08 MED ORDER — DICLOFENAC SODIUM 1 % EX GEL: 4.0000 g | Freq: Four times a day (QID) | CUTANEOUS | 0 refills | Status: DC | PRN

## 2022-04-08 MED ORDER — METFORMIN HCL 500 MG PO TABS: 500.0000 mg | ORAL_TABLET | Freq: Every day | ORAL | 0 refills | Status: DC

## 2022-04-08 NOTE — Progress Notes (Signed)
PROGRESS NOTE   Subjective/Complaints: Looking forward to going home today. No new concerns.   ROS: +Hearing loss-chronic, +insomnia-improved, +phantom pain-improving, +scabs on L toes-improving. +Constipation-improving. Denies dizziness/lightheadedness, CP or SOB, abd pain, n/v/d, HA, cough, new/worsening numbness/tingling/weakness, Vision changes   Objective:   No results found. Recent Labs    04/08/22 0604  WBC 4.2  HGB 8.3*  HCT 24.7*  PLT 145*    Recent Labs    04/08/22 0604  NA 136  K 3.8  CL 103  CO2 25  GLUCOSE 100*  BUN 18  CREATININE 0.64  CALCIUM 8.3*     Intake/Output Summary (Last 24 hours) at 04/08/2022 0806 Last data filed at 04/08/2022 0500 Gross per 24 hour  Intake 600 ml  Output 1450 ml  Net -850 ml         Physical Exam: Vital Signs Blood pressure (!) 104/52, pulse 68, temperature 98.5 F (36.9 C), resp. rate 16, height '5\' 8"'  (1.727 m), weight 60.5 kg, SpO2 100 %.  Gen: no distress, normal appearing, in bed sitting upright HEENT: oral mucosa pink and moist, NCAT, conjugate gaze Cardio: RRR Pulm: CTAB, normal effort, normal rate of breathing Abd: soft, non-tender, non-distended, +BS Ext: trace LLE edema mid-calf to ankle, improved from last week Limb protector in place RLE Psych: Pt's affect is appropriate. Pt is cooperative Skin: Scabs on L toes- improving.   Neuro:   Mental Status: He is oriented to person, place, and time. Follows commands, makes eye contact, cn 2-12 grossly intact    Comments: Decreased to light touch from mid thighs down B/L  Musculoskeletal:     Cervical back: Neck supple. No tenderness.     Comments: UE strength 4+/5 B/L R HF and KE 4/5- limited by pain LLE_ HF 3-/5; KE 3+/5; DF 4/5; PF 4-/5  L olecranon bursitis- foam dressing in place on elbow BKA incision appears to be healing well, no signs of infection        Assessment/Plan: 1. Functional  deficits which require 3+ hours per day of interdisciplinary therapy in a comprehensive inpatient rehab setting. Physiatrist is providing close team supervision and 24 hour management of active medical problems listed below. Physiatrist and rehab team continue to assess barriers to discharge/monitor patient progress toward functional and medical goals  Care Tool:  Bathing  Bathing activity did not occur:  (Pt choosing to do simple spongebath due to increased fatigue.) Body parts bathed by patient: Right arm, Left arm, Chest, Abdomen, Front perineal area, Right upper leg, Left upper leg, Face, Left lower leg   Body parts bathed by helper: Buttocks Body parts n/a: Right lower leg   Bathing assist Assist Level: Supervision/Verbal cueing     Upper Body Dressing/Undressing Upper body dressing   What is the patient wearing?: Pull over shirt    Upper body assist Assist Level: Independent with assistive device    Lower Body Dressing/Undressing Lower body dressing      What is the patient wearing?: Underwear/pull up, Pants     Lower body assist Assist for lower body dressing: Independent with assitive device     Toileting Toileting Toileting Activity did not occur Landscape architect and  hygiene only): N/A (no void or bm)  Toileting assist Assist for toileting: Contact Guard/Touching assist     Transfers Chair/bed transfer  Transfers assist     Chair/bed transfer assist level: Supervision/Verbal cueing     Locomotion Ambulation   Ambulation assist   Ambulation activity did not occur: Safety/medical concerns          Walk 10 feet activity   Assist  Walk 10 feet activity did not occur: Safety/medical concerns        Walk 50 feet activity   Assist Walk 50 feet with 2 turns activity did not occur: Safety/medical concerns         Walk 150 feet activity   Assist Walk 150 feet activity did not occur: Safety/medical concerns         Walk 10 feet  on uneven surface  activity   Assist Walk 10 feet on uneven surfaces activity did not occur: Safety/medical concerns         Wheelchair     Assist Is the patient using a wheelchair?: Yes Type of Wheelchair: Power    Wheelchair assist level: Supervision/Verbal cueing Max wheelchair distance: 150+    Wheelchair 50 feet with 2 turns activity    Assist        Assist Level: Supervision/Verbal cueing   Wheelchair 150 feet activity     Assist      Assist Level: Supervision/Verbal cueing   Blood pressure (!) 104/52, pulse 68, temperature 98.5 F (36.9 C), resp. rate 16, height '5\' 8"'  (1.727 m), weight 60.5 kg, SpO2 100 %.   Medical Problem List and Plan: 1. 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  Antithrombotics: -DVT/anticoagulation:  Pharmaceutical: Eliquis 72m BID             -antiplatelet therapy: N/A  3. Pain Management:  Oxycodone prn. Voltaren 4g QID PRN. Robaxin 5071mq6h PRN. Tramadol 5073m6h PRN. -Lyrica TID-->75 mg, 50 mg 75 mg. Might benefit from increasing Lyrica due to nerve pain on B/L LE's AND phantom pain  -Pt reports nerve pain and phantom pain under control, will continue current medications and monitor   -1/8 Increase lyrica to 45m65mD  -1/9 reports pain controlled continue to monitor  -1/11 Increase lyrica to 100mg45m  -04/07/22 well controlled, continue regimen  4. Insomnia: continue trazodone prn  -1/17 not improved with trazodone 50mg,67ml try melatonin 5mg PR39m-04/07/22 improved, continue current medications  5. Neuropsych/cognition: This patient is capable of making decisions on his own behalf.  6. Skin/Wound Care: Routine pressure relief measures.             -monitor incision  for healing.  -discuss staple removal timing before leaving             -protein supplement to promote wound healing.   7. Fluids/Electrolytes/Nutrition: Monitor I/O. F/up weekly labs (next 04/08/22)             -add protein supplement. Vitamins.   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  Home medication is glipizide  2.58m daily             -will check A1C in am.              -continue to hold glipizide and resume if elevated.   -1/6 CBGs stable, continue to monitor trend  -1/7: glipizide 2.583mresumed  -1/8 monitor for response to medication change -1/17 intermittently elevated, hesitant to increase glipizide due to concern of hypoglycemia, will change to metformin 50077mD  -04/07/22 stable, continue regimen  CBG (last 3)  Recent Labs    04/07/22 1627 04/07/22 2106 04/08/22 0620  GLUCAP 231* 152* 103*    9. Afib w/RVR: Diagnosed 11/2021 s/p ablation 02/12/22 by Dr. AssCandis Musa-Monitor HR TID.              -Continue metoprolol 40m82mD and Eliquis. -04/07/22 HR stable, continue to monitor   10. Persistent hyponatremia: Recheck Na level in am.   -1/5 Salt tabs started, recheck BMP tomorrow  -1/8 Na improved to 134   -1/17 stable at 137  -1/22 stable at 136  11. IgA Lambda MGUS w/7% sideroblasts : Thrombocytopenia stable.   -was getting transfusion PTA due to illness.  -monitor for any signs of bleeding.   12. Acute on chronic anemia: Improved post transfusion w/one unit PRBC up to 8.8 from 6.7 yesterday.              -1/22 HGB stable at 8.3  13. Congestive HFrEF: Heart healthy diet. Continue Lipitor 80mg10mand metoprolol XR 40mg 23m Daily weight. 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  -Replaced foam dressing, voltaren prn  16. Loose stools- have stopped pt's scheduled bowel meds- isn't on ABX- might Benefit from fiber if things don't improve.  -1/5 reports improved, continue to monitor -03/31/22 adequate bowel regimen, continue and monitor -04/06/22 now having constipation; see #20 below  17. Hypotensive: will maintain lopressor given afib, continue to monitor BP -03/31/22 BP still relatively stable, continue lopressor and monitor -1/16 decrease lopressor dose to 12.5 -1/22 BP soft but stable Vitals:   04/05/22 0445 04/05/22 1417 04/05/22 2015 04/06/22 0523  BP: 116/60 (!) 103/45 (!) 111/47 127/60   04/06/22 1328 04/06/22 1932 04/06/22 2137 04/07/22 0405  BP: (!) 93/53 (!) 97/45 (!) 114/52 (!) 111/49   04/07/22 1315 04/07/22 1345 04/07/22 1922 04/08/22 0428  BP: (!) 95/46 (!) 109/45 (!) 107/51 (!) 104/52     18. Hypokalemia  -1/8 Kdur 40meq to8m recheck tomorrow  -1/9 K+ improved to 3.6  -Monitor on weekly labs, next 04/01/22  -1/15 Kdur 30meq tod86mstart 10meq ever74mher day -1/17 will increase kdur to 20meq as K+45mroved to 3.6 but is still suboptimal  -1/22 stable at 3.8  19. L foot scabs.  Hx of PVD -Keep dry open to air. F/u with vascular after discharge.  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 Constipation: having BMs q3days here -04/06/22 will add back Colace 164m BID + Miralax 17g QD, still has PRNs (Miralax, Dulcolax supp, and Fleet's enema) also, monitor for  effect -04/07/22 BM yesterday, continue current regimen.      LOS: 18 days A FACE TO FACE EVALUATION WAS PERFORMED  YJennye Boroughs1/22/2024, 8:06 AM

## 2022-04-08 NOTE — Progress Notes (Signed)
Inpatient Rehabilitation Discharge Medication Review by a Pharmacist  A complete drug regimen review was completed for this patient to identify any potential clinically significant medication issues.  High Risk Drug Classes Is patient taking? Indication by Medication  Antipsychotic No   Anticoagulant Yes Apixaban - Afib  Antibiotic Yes Valtrex - prophylaxis while on IS  Opioid Yes Oxycodone - pain  Antiplatelet No   Hypoglycemics/insulin Yes Metformin - DM  Vasoactive Medication Yes Metoprolol - HTN/Afib  Chemotherapy No   Other Yes Atorvastatin - HLD Iron, Vit C, Vit D, NaCl tabs, calcium, KCl - supplement Hydroxychloroquine, prednisone - hx CPPD/RA Pregabalin - neuropathy Miralax, docusate - constipation Pantoprazole - GERD Reblozyl - anemia from MDS Voltaren gel - elbow pain     Type of Medication Issue Identified Description of Issue Recommendation(s)  Drug Interaction(s) (clinically significant)     Duplicate Therapy     Allergy     No Medication Administration End Date     Incorrect Dose     Additional Drug Therapy Needed     Significant med changes from prior encounter (inform family/care partners about these prior to discharge).    Glipizide - stopped   Other       Clinically significant medication issues were identified that warrant physician communication and completion of prescribed/recommended actions by midnight of the next day:  No    Name of provider notified for urgent issues identified:   Provider Method of Notification: secure mesage    Pharmacist comments:    Time spent performing this drug regimen review (minutes): 20  Loura Back, PharmD, BCPS Clinical Pharmacist Clinical phone for 04/08/2022 is [phone] 04/08/2022 7:20 AM

## 2022-04-08 NOTE — Progress Notes (Signed)
Inpatient Rehabilitation Care Coordinator Discharge Note   Patient Details  Name: David Joseph MRN: [id number] Date of Birth: 1940/08/11   Discharge location: HOME WIHT WIFE WHO CAN PROVIDE 24/7 SUPERVISION  Length of Stay: 18 DAYS  Discharge activity level: SUPERVISION-CGA LEVEL  Home/community participation: ACTIVE  Patient response WP:TYYPEJ Literacy - How often do you need to have someone help you when you read instructions, pamphlets, or other written material from your doctor or pharmacy?: Never  Patient response YL:TEIHDT Isolation - How often do you feel lonely or isolated from those around you?: Never  Services provided included: MD, RD, PT, OT, RN, CM, TR, Pharmacy, Neuropsych, SW  Financial Services:  Field seismologist Utilized: [employer] offered to/list presented to: PT AND WIFE  Follow-up services arranged:  Home Health, DME, Patient/Family request agency HH/DME Home Health Agency: [facility] OT RN    DME : [facility] BEDSIDE COMMODE AND STALLS-POWER CHAIR HH/DME Requested Agency: [facility]  Patient response to transportation need: Is the patient able to respond to transportation needs?: Yes In the past 12 months, has lack of transportation kept you from medical appointments or from getting medications?: No In the past 12 months, has lack of transportation kept you from meetings, work, or from getting things needed for daily living?: No    Comments (or additional information):WIFE WAS HERE DAILY AND DID HANDS ON EDUCATION BOTH FEEL PREPARED FOR DC TODAY  Patient/Family verbalized understanding of follow-up arrangements:  Yes  Individual responsible for coordination of the follow-up plan: [name]-WIFE [phone]  Confirmed correct DME delivered: Lucy Chris 04/08/2022    Arayah Krouse, Lemar Livings

## 2022-04-08 NOTE — Progress Notes (Signed)
Patient discharged to home, transported by his wife.

## 2022-04-08 NOTE — Progress Notes (Signed)
Restless night. Increased complaint of "burning" pain to left foot. PRN Oxy Ir 10mg 's given at 1937 & 0045. PRN robaxin given at 0200. LLE with +2 pitting edema, especially to left foot. Minimal drainage from right BKA, sticks to stump shrinker. Moistening with NS to loosen. Left heel red and blanchable, educated patient on assessing heel and keeping elevated off all surfaces. Gauze between toes on left foot. Black scabbed areas on 2nd and 3rd toes and between those toes.  LBM 01/21. Excited about going home today. 2/21 A

## 2022-04-08 NOTE — Discharge Instructions (Addendum)
Inpatient Rehab Discharge Instructions  David Joseph Discharge date and time:  04/08/2022  Activities/Precautions/ Functional Status: Activity: no lifting, driving, or strenuous exercise till cleared by MD Diet: Low fat/diabetic diet Wound Care: keep wound clean and dry   Functional status:  ___ No restrictions     ___ Walk up steps independently ___ 24/7 supervision/assistance   ___ Walk up steps with assistance ___ Intermittent supervision/assistance  ___ Bathe/dress independently ___ Walk with walker     __X_ Bathe/dress with assistance ___ Walk Independently    ___ Shower independently ___ Walk with assistance    ___ Shower with assistance _X__ No alcohol     ___ Return to work/school ________   Special Instructions:  Monitor blood sugars 2-4 times a day before meals and/or at bedtime and follow up with PCP for input.    COMMUNITY REFERRALS UPON DISCHARGE:    Home Health:   PT  OT AND RN                  Agency:[facility] Phone: [phone]    Medical Equipment/Items Ordered:drop-arm bedside commode[facility]  [phone]  stalls-power chair [phone]                                                     My questions have been answered and I understand these instructions. I will adhere to these goals and the provided educational materials after my discharge from the hospital.  Patient/Caregiver Signature _______________________________ Date __________  Clinician Signature _______________________________________ Date __________  Please bring this form and your medication list with you to all your follow-up doctor's appointments.

## 2022-04-09 NOTE — Discharge Summary (Signed)
Physician Discharge Summary  Patient ID: David Joseph MRN: [id number] DOB/AGE: 12-16-1940 82 y.o.  Admit date: 03/21/2022 Discharge date: 04/08/2022  Discharge Diagnoses:  Principal Problem:   Unilateral complete BKA, right, subsequent encounter [facility] Active Problems:   Acute blood loss anemia (ABLA)   Olecranon bursitis of left elbow   Chronic systolic congestive heart failure (HCC)   Anemia   S/P unilateral BKA (below knee amputation), right (HCC)   Hypotension   PVD (peripheral vascular disease) (HCC)   Insomnia   Discharged Condition: stable  Significant Diagnostic Studies: No results found.  Labs:  Basic Metabolic Panel: Recent Labs  Lab 04/03/22 0742 04/08/22 0604  NA 137 136  K 3.6 3.8  CL 102 103  CO2 26 25  GLUCOSE 142* 100*  BUN 19 18  CREATININE 0.72 0.64  CALCIUM 8.7* 8.3*  MG 1.9  --     CBC:    Latest Ref Rng & Units 04/08/2022    6:04 AM 04/01/2022    6:01 AM 03/25/2022    6:37 AM  CBC  WBC 4.0 - 10.5 K/uL 4.2  4.0  4.1   Hemoglobin 13.0 - 17.0 g/dL 8.3  8.6  8.5   Hematocrit 39.0 - 52.0 % 24.7  25.7  25.7   Platelets 150 - 400 K/uL 145  149  176      CBG: Recent Labs  Lab 04/07/22 0616 04/07/22 1118 04/07/22 1627 04/07/22 2106 04/08/22 0620  GLUCAP 97 122* 231* 152* 103*    Brief HPI:   David Joseph is a 82 y.o. male with history of T2DM, CVA, HFrEF, CPPD, CAF s/p CV, myelodysplastic syndrome, RA, PAD s/p multiple bypasses with left great toe amputation, chronic lower extremity wounds on RLE with gangrenous changes and critical limb ischemia.  He was admitted to [facility] for right-BKA on 03/06/2022 by Dr. Bobby Rumpf.  Postop had issues with pain as well as fevers.  Lyrica was added to help manage neuropathic pain and Eliquis was resumed.  IV narcotics were weaned off and he was transfused with 1 units PRBC for downward trending H&H.  Pain control was improving and patient was working with therapy.  He continued to be limited by  weakness affecting mobility and ADLs.  CIR was recommended due to functional decline.   Hospital Course: ROBIN PAFFORD was admitted to rehab 03/21/2022 for inpatient therapies to consist of PT  and OT at least three hours five days a week. Past admission physiatrist, therapy team and rehab RN have worked together to provide customized collaborative inpatient rehab. His blood pressure started trending downwards and metoprolol was decreased to 12/5 mg bid. Heart has been stable with increase in activity. Right BKA site is intact with staples in place and healing well with occasional minimal serous drainage from incision. Scabs on left foot are treated with dry dressing and he was advised to continue painting them with betadine, utilizing hs off loading shoe and following up with podiatry/Vascular for monitoring. Bowel program has been adjusted to manage constipation. Follow up CBC showed H/H to be low but at his baseline per wife. He did develop mild thrombocytopenia and recommend repeat CBC in 1-2 weeks for monitoring.   Lyrica was titrated upwards to manage neuropathy and has been effective in managing symptoms. He has also been educated on desensitization techniques. His diabetes has been monitored with ac/hs CBG checks and SSI was use prn for tighter BS control.  Low dose glipizide as resumed but  due to issues with hypoglycemia, this was changed to metformin with improvement in BS. Follow up labs showed recurrent hypokalemia therefore low dose K dur was added for supplement. He has made gains during his stay but has been limited by LLE weakness, shoulder pain and weakness therefore motorized wheelchair ordered. Supervision is recommended at discharge. He will continue to receive follow up HHPT, HHOT and HHRN by [facility] after discharge.    Rehab course: During patient's stay in rehab weekly team conferences were held to monitor patient's progress, set goals and discuss barriers to discharge.  At admission, patient required mod assist with basic ADL task and max assist with mobility. He  has had improvement in activity tolerance, balance, postural control as well as ability to compensate for deficits.  He is able to complete ADL tasks with CGA to supervision level. He requires CGA to supervision for transfers  and is able to use his power wheelchair with supervision. Family education has been completed.   Discharge disposition: 01-Home or Self Care  Diet: Heart healthy/Carb modified.   Special Instructions: Monitor BS with resumption of Libre and follow up with PCP for further adjustment of BS Recommend repeat CBC and BMET in 1-2 weeks to monitor Platelets and potassium level.    Allergies as of 04/08/2022       Reactions   Iodinated Contrast Media Rash   Can tolerate IV dye with steroid protocol        Medication List     STOP taking these medications    glipiZIDE 5 MG tablet Commonly known as: GLUCOTROL       TAKE these medications    (feeding supplement) PROSource Plus liquid Take 30 mLs by mouth 2 (two) times daily with a meal.   acetaminophen 325 MG tablet Commonly known as: TYLENOL Take 1-2 tablets (325-650 mg total) by mouth every 4 (four) hours as needed for mild pain.   ascorbic acid 500 MG tablet Commonly known as: VITAMIN C Take 1 tablet (500 mg total) by mouth daily.   atorvastatin 80 MG tablet Commonly known as: LIPITOR Take 80 mg by mouth daily.   calcium citrate 950 (200 Ca) MG tablet Commonly known as: CALCITRATE - dosed in mg elemental calcium Take 1 tablet (200 mg of elemental calcium total) by mouth daily.   diclofenac Sodium 1 % Gel Commonly known as: VOLTAREN Apply 4 g topically 4 (four) times daily as needed (L elbow pain). Notes to patient: Works better if you use it 4 x day for a week or so then resume as needed for pain   docusate sodium 100 MG capsule Commonly known as: COLACE Take 1 capsule (100 mg total) by mouth 2 (two)  times daily.   Eliquis 5 MG Tabs tablet Generic drug: apixaban Take 5 mg by mouth 2 (two) times daily.   hydroxychloroquine 200 MG tablet Commonly known as: PLAQUENIL Take 200 mg by mouth daily.   iron polysaccharides 150 MG capsule Commonly known as: Nu-Iron Take 1 capsule (150 mg total) by mouth daily. Notes to patient: Recommend starting iron as you remain anemic   metFORMIN 500 MG tablet Commonly known as: GLUCOPHAGE Take 1 tablet (500 mg total) by mouth daily with breakfast.   metoprolol succinate 25 MG 24 hr tablet Commonly known as: TOPROL-XL Take 0.5 tablets (12.5 mg total) by mouth 2 (two) times daily. What changed: how much to take Notes to patient: Dose decreased   oxycodone 5 MG capsule Commonly known as:  OXY-IR Take 5 mg by mouth every 4 (four) hours as needed for pain.   pantoprazole 40 MG tablet Commonly known as: PROTONIX Take 1 tablet (40 mg total) by mouth daily.   polyethylene glycol 17 g packet Commonly known as: MIRALAX / GLYCOLAX Take 17 g by mouth daily as needed.   potassium chloride SA 20 MEQ tablet Commonly known as: KLOR-CON M Take 1 tablet (20 mEq total) by mouth daily.   predniSONE 5 MG tablet Commonly known as: DELTASONE Take 15 mg by mouth daily.   pregabalin 100 MG capsule Commonly known as: LYRICA Take 1 capsule (100 mg total) by mouth 3 (three) times daily. What changed:  medication strength how much to take when to take this Another medication with the same name was removed. Continue taking this medication, and follow the directions you see here. Notes to patient: Increased for pain control/ neuropathy   REBLOZYL [place] Inject into the skin every 21 ( twenty-one) days. Notes to patient: Resume to help with anemia   sodium chloride 1 g tablet Take 1 tablet (1 g total) by mouth 3 (three) times daily with meals.   valACYclovir 500 MG tablet Commonly known as: VALTREX Take 500 mg by mouth daily. Notes to patient: Sodium  levels are better and can stop after a couple of days and have labs checked at primary care for follow up.   Vitamin D3 1000 units Caps Take 1 tablet by mouth daily.        Follow-up Information     Donetta Potts, MD Follow up.   Specialty: Internal Medicine Why: Call in 1-2 days for post hospital follow up Contact information: [address] [place] [address] [phone]         Fanny Dance, MD Follow up.   Specialty: Physical Medicine and Rehabilitation Contact information: [address] [place] [address] [phone]         Deneise Lever, MD Follow up.   Specialty: Vascular Surgery Why: Call in 1-2 days for post hospital follow up Contact information: [address] [place] [address] [phone]         Meredith Leeds, MD Follow up.   Specialty: Cardiology Why: Call in 1-2 days for post hospital follow up Contact information: [address] [place] [address] [phone]                 Signed: Jacquelynn Cree 04/09/2022, 5:50 PM

## 2022-04-10 DIAGNOSIS — M818 Other osteoporosis without current pathological fracture: Secondary | ICD-10-CM | POA: Diagnosis not present

## 2022-04-10 DIAGNOSIS — D469 Myelodysplastic syndrome, unspecified: Secondary | ICD-10-CM | POA: Diagnosis not present

## 2022-04-10 DIAGNOSIS — Z89511 Acquired absence of right leg below knee: Secondary | ICD-10-CM | POA: Diagnosis not present

## 2022-04-10 DIAGNOSIS — D638 Anemia in other chronic diseases classified elsewhere: Secondary | ICD-10-CM | POA: Diagnosis not present

## 2022-04-10 DIAGNOSIS — I739 Peripheral vascular disease, unspecified: Secondary | ICD-10-CM | POA: Diagnosis not present

## 2022-04-10 DIAGNOSIS — C61 Malignant neoplasm of prostate: Secondary | ICD-10-CM | POA: Diagnosis not present

## 2022-04-10 DIAGNOSIS — T380X5A Adverse effect of glucocorticoids and synthetic analogues, initial encounter: Secondary | ICD-10-CM | POA: Diagnosis not present

## 2022-04-10 DIAGNOSIS — Z5111 Encounter for antineoplastic chemotherapy: Secondary | ICD-10-CM | POA: Diagnosis not present

## 2022-04-12 DIAGNOSIS — D696 Thrombocytopenia, unspecified: Secondary | ICD-10-CM | POA: Diagnosis not present

## 2022-04-12 DIAGNOSIS — E871 Hypo-osmolality and hyponatremia: Secondary | ICD-10-CM | POA: Diagnosis not present

## 2022-04-12 DIAGNOSIS — M1990 Unspecified osteoarthritis, unspecified site: Secondary | ICD-10-CM | POA: Diagnosis not present

## 2022-04-12 DIAGNOSIS — D62 Acute posthemorrhagic anemia: Secondary | ICD-10-CM | POA: Diagnosis not present

## 2022-04-12 DIAGNOSIS — D469 Myelodysplastic syndrome, unspecified: Secondary | ICD-10-CM | POA: Diagnosis not present

## 2022-04-12 DIAGNOSIS — Z7952 Long term (current) use of systemic steroids: Secondary | ICD-10-CM | POA: Diagnosis not present

## 2022-04-12 DIAGNOSIS — K5900 Constipation, unspecified: Secondary | ICD-10-CM | POA: Diagnosis not present

## 2022-04-12 DIAGNOSIS — E1151 Type 2 diabetes mellitus with diabetic peripheral angiopathy without gangrene: Secondary | ICD-10-CM | POA: Diagnosis not present

## 2022-04-12 DIAGNOSIS — E876 Hypokalemia: Secondary | ICD-10-CM | POA: Diagnosis not present

## 2022-04-12 DIAGNOSIS — G4700 Insomnia, unspecified: Secondary | ICD-10-CM | POA: Diagnosis not present

## 2022-04-12 DIAGNOSIS — M7022 Olecranon bursitis, left elbow: Secondary | ICD-10-CM | POA: Diagnosis not present

## 2022-04-12 DIAGNOSIS — Z4781 Encounter for orthopedic aftercare following surgical amputation: Secondary | ICD-10-CM | POA: Diagnosis not present

## 2022-04-12 DIAGNOSIS — Z7901 Long term (current) use of anticoagulants: Secondary | ICD-10-CM | POA: Diagnosis not present

## 2022-04-12 DIAGNOSIS — Z79891 Long term (current) use of opiate analgesic: Secondary | ICD-10-CM | POA: Diagnosis not present

## 2022-04-12 DIAGNOSIS — C61 Malignant neoplasm of prostate: Secondary | ICD-10-CM | POA: Diagnosis not present

## 2022-04-12 DIAGNOSIS — D472 Monoclonal gammopathy: Secondary | ICD-10-CM | POA: Diagnosis not present

## 2022-04-12 DIAGNOSIS — Z7984 Long term (current) use of oral hypoglycemic drugs: Secondary | ICD-10-CM | POA: Diagnosis not present

## 2022-04-12 DIAGNOSIS — M069 Rheumatoid arthritis, unspecified: Secondary | ICD-10-CM | POA: Diagnosis not present

## 2022-04-12 DIAGNOSIS — B0221 Postherpetic geniculate ganglionitis: Secondary | ICD-10-CM | POA: Diagnosis not present

## 2022-04-12 DIAGNOSIS — Z89511 Acquired absence of right leg below knee: Secondary | ICD-10-CM | POA: Diagnosis not present

## 2022-04-12 DIAGNOSIS — M1120 Other chondrocalcinosis, unspecified site: Secondary | ICD-10-CM | POA: Diagnosis not present

## 2022-04-12 DIAGNOSIS — I5022 Chronic systolic (congestive) heart failure: Secondary | ICD-10-CM | POA: Diagnosis not present

## 2022-04-12 DIAGNOSIS — I959 Hypotension, unspecified: Secondary | ICD-10-CM | POA: Diagnosis not present

## 2022-04-12 DIAGNOSIS — I482 Chronic atrial fibrillation, unspecified: Secondary | ICD-10-CM | POA: Diagnosis not present

## 2022-04-12 DIAGNOSIS — E1140 Type 2 diabetes mellitus with diabetic neuropathy, unspecified: Secondary | ICD-10-CM | POA: Diagnosis not present

## 2022-04-15 DIAGNOSIS — Z4781 Encounter for orthopedic aftercare following surgical amputation: Secondary | ICD-10-CM | POA: Diagnosis not present

## 2022-04-15 DIAGNOSIS — E1151 Type 2 diabetes mellitus with diabetic peripheral angiopathy without gangrene: Secondary | ICD-10-CM | POA: Diagnosis not present

## 2022-04-15 DIAGNOSIS — Z89511 Acquired absence of right leg below knee: Secondary | ICD-10-CM | POA: Diagnosis not present

## 2022-04-15 DIAGNOSIS — M069 Rheumatoid arthritis, unspecified: Secondary | ICD-10-CM | POA: Diagnosis not present

## 2022-04-15 DIAGNOSIS — I5022 Chronic systolic (congestive) heart failure: Secondary | ICD-10-CM | POA: Diagnosis not present

## 2022-04-15 DIAGNOSIS — D469 Myelodysplastic syndrome, unspecified: Secondary | ICD-10-CM | POA: Diagnosis not present

## 2022-04-16 DIAGNOSIS — S88119A Complete traumatic amputation at level between knee and ankle, unspecified lower leg, initial encounter: Secondary | ICD-10-CM | POA: Diagnosis not present

## 2022-04-16 DIAGNOSIS — L97521 Non-pressure chronic ulcer of other part of left foot limited to breakdown of skin: Secondary | ICD-10-CM | POA: Diagnosis not present

## 2022-04-16 DIAGNOSIS — E119 Type 2 diabetes mellitus without complications: Secondary | ICD-10-CM | POA: Diagnosis not present

## 2022-04-16 DIAGNOSIS — R234 Changes in skin texture: Secondary | ICD-10-CM | POA: Diagnosis not present

## 2022-04-16 DIAGNOSIS — I739 Peripheral vascular disease, unspecified: Secondary | ICD-10-CM | POA: Diagnosis not present

## 2022-04-16 DIAGNOSIS — Z794 Long term (current) use of insulin: Secondary | ICD-10-CM | POA: Diagnosis not present

## 2022-04-17 DIAGNOSIS — I5022 Chronic systolic (congestive) heart failure: Secondary | ICD-10-CM | POA: Diagnosis not present

## 2022-04-17 DIAGNOSIS — Z89511 Acquired absence of right leg below knee: Secondary | ICD-10-CM | POA: Diagnosis not present

## 2022-04-17 DIAGNOSIS — M069 Rheumatoid arthritis, unspecified: Secondary | ICD-10-CM | POA: Diagnosis not present

## 2022-04-17 DIAGNOSIS — D469 Myelodysplastic syndrome, unspecified: Secondary | ICD-10-CM | POA: Diagnosis not present

## 2022-04-17 DIAGNOSIS — Z4781 Encounter for orthopedic aftercare following surgical amputation: Secondary | ICD-10-CM | POA: Diagnosis not present

## 2022-04-17 DIAGNOSIS — E1151 Type 2 diabetes mellitus with diabetic peripheral angiopathy without gangrene: Secondary | ICD-10-CM | POA: Diagnosis not present

## 2022-04-18 DIAGNOSIS — Z89511 Acquired absence of right leg below knee: Secondary | ICD-10-CM | POA: Diagnosis not present

## 2022-04-18 DIAGNOSIS — E1151 Type 2 diabetes mellitus with diabetic peripheral angiopathy without gangrene: Secondary | ICD-10-CM | POA: Diagnosis not present

## 2022-04-18 DIAGNOSIS — M069 Rheumatoid arthritis, unspecified: Secondary | ICD-10-CM | POA: Diagnosis not present

## 2022-04-18 DIAGNOSIS — I5022 Chronic systolic (congestive) heart failure: Secondary | ICD-10-CM | POA: Diagnosis not present

## 2022-04-18 DIAGNOSIS — Z4781 Encounter for orthopedic aftercare following surgical amputation: Secondary | ICD-10-CM | POA: Diagnosis not present

## 2022-04-18 DIAGNOSIS — D469 Myelodysplastic syndrome, unspecified: Secondary | ICD-10-CM | POA: Diagnosis not present

## 2022-04-19 DIAGNOSIS — Z4781 Encounter for orthopedic aftercare following surgical amputation: Secondary | ICD-10-CM | POA: Diagnosis not present

## 2022-04-19 DIAGNOSIS — I5022 Chronic systolic (congestive) heart failure: Secondary | ICD-10-CM | POA: Diagnosis not present

## 2022-04-19 DIAGNOSIS — E1151 Type 2 diabetes mellitus with diabetic peripheral angiopathy without gangrene: Secondary | ICD-10-CM | POA: Diagnosis not present

## 2022-04-19 DIAGNOSIS — M069 Rheumatoid arthritis, unspecified: Secondary | ICD-10-CM | POA: Diagnosis not present

## 2022-04-19 DIAGNOSIS — Z89511 Acquired absence of right leg below knee: Secondary | ICD-10-CM | POA: Diagnosis not present

## 2022-04-19 DIAGNOSIS — D469 Myelodysplastic syndrome, unspecified: Secondary | ICD-10-CM | POA: Diagnosis not present

## 2022-04-22 DIAGNOSIS — I739 Peripheral vascular disease, unspecified: Secondary | ICD-10-CM | POA: Diagnosis not present

## 2022-04-22 DIAGNOSIS — Z95820 Peripheral vascular angioplasty status with implants and grafts: Secondary | ICD-10-CM | POA: Diagnosis not present

## 2022-04-22 DIAGNOSIS — I70212 Atherosclerosis of native arteries of extremities with intermittent claudication, left leg: Secondary | ICD-10-CM | POA: Diagnosis not present

## 2022-04-23 DIAGNOSIS — Z89511 Acquired absence of right leg below knee: Secondary | ICD-10-CM | POA: Diagnosis not present

## 2022-04-23 DIAGNOSIS — Z4781 Encounter for orthopedic aftercare following surgical amputation: Secondary | ICD-10-CM | POA: Diagnosis not present

## 2022-04-23 DIAGNOSIS — D469 Myelodysplastic syndrome, unspecified: Secondary | ICD-10-CM | POA: Diagnosis not present

## 2022-04-23 DIAGNOSIS — E1151 Type 2 diabetes mellitus with diabetic peripheral angiopathy without gangrene: Secondary | ICD-10-CM | POA: Diagnosis not present

## 2022-04-23 DIAGNOSIS — M069 Rheumatoid arthritis, unspecified: Secondary | ICD-10-CM | POA: Diagnosis not present

## 2022-04-23 DIAGNOSIS — I5022 Chronic systolic (congestive) heart failure: Secondary | ICD-10-CM | POA: Diagnosis not present

## 2022-04-24 DIAGNOSIS — I5022 Chronic systolic (congestive) heart failure: Secondary | ICD-10-CM | POA: Diagnosis not present

## 2022-04-24 DIAGNOSIS — E1151 Type 2 diabetes mellitus with diabetic peripheral angiopathy without gangrene: Secondary | ICD-10-CM | POA: Diagnosis not present

## 2022-04-24 DIAGNOSIS — M069 Rheumatoid arthritis, unspecified: Secondary | ICD-10-CM | POA: Diagnosis not present

## 2022-04-24 DIAGNOSIS — Z89511 Acquired absence of right leg below knee: Secondary | ICD-10-CM | POA: Diagnosis not present

## 2022-04-24 DIAGNOSIS — D469 Myelodysplastic syndrome, unspecified: Secondary | ICD-10-CM | POA: Diagnosis not present

## 2022-04-24 DIAGNOSIS — Z4781 Encounter for orthopedic aftercare following surgical amputation: Secondary | ICD-10-CM | POA: Diagnosis not present

## 2022-04-26 DIAGNOSIS — G629 Polyneuropathy, unspecified: Secondary | ICD-10-CM | POA: Diagnosis not present

## 2022-04-26 DIAGNOSIS — R7989 Other specified abnormal findings of blood chemistry: Secondary | ICD-10-CM | POA: Diagnosis not present

## 2022-04-26 DIAGNOSIS — E1140 Type 2 diabetes mellitus with diabetic neuropathy, unspecified: Secondary | ICD-10-CM | POA: Diagnosis not present

## 2022-04-26 DIAGNOSIS — K4090 Unilateral inguinal hernia, without obstruction or gangrene, not specified as recurrent: Secondary | ICD-10-CM | POA: Diagnosis not present

## 2022-04-26 DIAGNOSIS — I739 Peripheral vascular disease, unspecified: Secondary | ICD-10-CM | POA: Diagnosis not present

## 2022-04-26 DIAGNOSIS — I5022 Chronic systolic (congestive) heart failure: Secondary | ICD-10-CM | POA: Diagnosis not present

## 2022-04-26 DIAGNOSIS — I4891 Unspecified atrial fibrillation: Secondary | ICD-10-CM | POA: Diagnosis not present

## 2022-04-26 DIAGNOSIS — B0229 Other postherpetic nervous system involvement: Secondary | ICD-10-CM | POA: Diagnosis not present

## 2022-04-26 DIAGNOSIS — I5020 Unspecified systolic (congestive) heart failure: Secondary | ICD-10-CM | POA: Diagnosis not present

## 2022-04-26 DIAGNOSIS — D649 Anemia, unspecified: Secondary | ICD-10-CM | POA: Diagnosis not present

## 2022-04-26 DIAGNOSIS — I959 Hypotension, unspecified: Secondary | ICD-10-CM | POA: Diagnosis not present

## 2022-04-26 DIAGNOSIS — I6529 Occlusion and stenosis of unspecified carotid artery: Secondary | ICD-10-CM | POA: Diagnosis not present

## 2022-04-26 DIAGNOSIS — Z89511 Acquired absence of right leg below knee: Secondary | ICD-10-CM | POA: Diagnosis not present

## 2022-04-26 DIAGNOSIS — E1151 Type 2 diabetes mellitus with diabetic peripheral angiopathy without gangrene: Secondary | ICD-10-CM | POA: Diagnosis not present

## 2022-04-26 DIAGNOSIS — D469 Myelodysplastic syndrome, unspecified: Secondary | ICD-10-CM | POA: Diagnosis not present

## 2022-04-26 DIAGNOSIS — Z4781 Encounter for orthopedic aftercare following surgical amputation: Secondary | ICD-10-CM | POA: Diagnosis not present

## 2022-04-26 DIAGNOSIS — M069 Rheumatoid arthritis, unspecified: Secondary | ICD-10-CM | POA: Diagnosis not present

## 2022-04-29 DIAGNOSIS — D469 Myelodysplastic syndrome, unspecified: Secondary | ICD-10-CM | POA: Diagnosis not present

## 2022-04-29 DIAGNOSIS — E1151 Type 2 diabetes mellitus with diabetic peripheral angiopathy without gangrene: Secondary | ICD-10-CM | POA: Diagnosis not present

## 2022-04-29 DIAGNOSIS — L0201 Cutaneous abscess of face: Secondary | ICD-10-CM | POA: Diagnosis not present

## 2022-04-29 DIAGNOSIS — Z89511 Acquired absence of right leg below knee: Secondary | ICD-10-CM | POA: Diagnosis not present

## 2022-04-29 DIAGNOSIS — M069 Rheumatoid arthritis, unspecified: Secondary | ICD-10-CM | POA: Diagnosis not present

## 2022-04-29 DIAGNOSIS — I5022 Chronic systolic (congestive) heart failure: Secondary | ICD-10-CM | POA: Diagnosis not present

## 2022-04-29 DIAGNOSIS — Z4781 Encounter for orthopedic aftercare following surgical amputation: Secondary | ICD-10-CM | POA: Diagnosis not present

## 2022-04-30 DIAGNOSIS — Z4781 Encounter for orthopedic aftercare following surgical amputation: Secondary | ICD-10-CM | POA: Diagnosis not present

## 2022-04-30 DIAGNOSIS — Z89511 Acquired absence of right leg below knee: Secondary | ICD-10-CM | POA: Diagnosis not present

## 2022-04-30 DIAGNOSIS — Z5111 Encounter for antineoplastic chemotherapy: Secondary | ICD-10-CM | POA: Diagnosis not present

## 2022-04-30 DIAGNOSIS — E1151 Type 2 diabetes mellitus with diabetic peripheral angiopathy without gangrene: Secondary | ICD-10-CM | POA: Diagnosis not present

## 2022-04-30 DIAGNOSIS — I482 Chronic atrial fibrillation, unspecified: Secondary | ICD-10-CM | POA: Diagnosis not present

## 2022-04-30 DIAGNOSIS — C61 Malignant neoplasm of prostate: Secondary | ICD-10-CM | POA: Diagnosis not present

## 2022-04-30 DIAGNOSIS — Z1501 Genetic susceptibility to malignant neoplasm of breast: Secondary | ICD-10-CM | POA: Diagnosis not present

## 2022-04-30 DIAGNOSIS — M069 Rheumatoid arthritis, unspecified: Secondary | ICD-10-CM | POA: Diagnosis not present

## 2022-04-30 DIAGNOSIS — D469 Myelodysplastic syndrome, unspecified: Secondary | ICD-10-CM | POA: Diagnosis not present

## 2022-04-30 DIAGNOSIS — I5022 Chronic systolic (congestive) heart failure: Secondary | ICD-10-CM | POA: Diagnosis not present

## 2022-04-30 DIAGNOSIS — D638 Anemia in other chronic diseases classified elsewhere: Secondary | ICD-10-CM | POA: Diagnosis not present

## 2022-04-30 DIAGNOSIS — I739 Peripheral vascular disease, unspecified: Secondary | ICD-10-CM | POA: Diagnosis not present

## 2022-04-30 DIAGNOSIS — D696 Thrombocytopenia, unspecified: Secondary | ICD-10-CM | POA: Diagnosis not present

## 2022-05-01 DIAGNOSIS — T380X5A Adverse effect of glucocorticoids and synthetic analogues, initial encounter: Secondary | ICD-10-CM | POA: Diagnosis not present

## 2022-05-01 DIAGNOSIS — Z681 Body mass index (BMI) 19 or less, adult: Secondary | ICD-10-CM | POA: Diagnosis not present

## 2022-05-01 DIAGNOSIS — L0390 Cellulitis, unspecified: Secondary | ICD-10-CM | POA: Diagnosis not present

## 2022-05-01 DIAGNOSIS — M818 Other osteoporosis without current pathological fracture: Secondary | ICD-10-CM | POA: Diagnosis not present

## 2022-05-01 DIAGNOSIS — D469 Myelodysplastic syndrome, unspecified: Secondary | ICD-10-CM | POA: Diagnosis not present

## 2022-05-01 DIAGNOSIS — R030 Elevated blood-pressure reading, without diagnosis of hypertension: Secondary | ICD-10-CM | POA: Diagnosis not present

## 2022-05-02 DIAGNOSIS — Z89511 Acquired absence of right leg below knee: Secondary | ICD-10-CM | POA: Diagnosis not present

## 2022-05-02 DIAGNOSIS — I5022 Chronic systolic (congestive) heart failure: Secondary | ICD-10-CM | POA: Diagnosis not present

## 2022-05-02 DIAGNOSIS — D469 Myelodysplastic syndrome, unspecified: Secondary | ICD-10-CM | POA: Diagnosis not present

## 2022-05-02 DIAGNOSIS — M069 Rheumatoid arthritis, unspecified: Secondary | ICD-10-CM | POA: Diagnosis not present

## 2022-05-02 DIAGNOSIS — Z4781 Encounter for orthopedic aftercare following surgical amputation: Secondary | ICD-10-CM | POA: Diagnosis not present

## 2022-05-02 DIAGNOSIS — E1151 Type 2 diabetes mellitus with diabetic peripheral angiopathy without gangrene: Secondary | ICD-10-CM | POA: Diagnosis not present

## 2022-05-03 DIAGNOSIS — Z4781 Encounter for orthopedic aftercare following surgical amputation: Secondary | ICD-10-CM | POA: Diagnosis not present

## 2022-05-03 DIAGNOSIS — Z89511 Acquired absence of right leg below knee: Secondary | ICD-10-CM | POA: Diagnosis not present

## 2022-05-03 DIAGNOSIS — E1151 Type 2 diabetes mellitus with diabetic peripheral angiopathy without gangrene: Secondary | ICD-10-CM | POA: Diagnosis not present

## 2022-05-03 DIAGNOSIS — I5022 Chronic systolic (congestive) heart failure: Secondary | ICD-10-CM | POA: Diagnosis not present

## 2022-05-03 DIAGNOSIS — M069 Rheumatoid arthritis, unspecified: Secondary | ICD-10-CM | POA: Diagnosis not present

## 2022-05-03 DIAGNOSIS — D469 Myelodysplastic syndrome, unspecified: Secondary | ICD-10-CM | POA: Diagnosis not present

## 2022-05-06 DIAGNOSIS — Z85828 Personal history of other malignant neoplasm of skin: Secondary | ICD-10-CM | POA: Diagnosis not present

## 2022-05-06 DIAGNOSIS — D239 Other benign neoplasm of skin, unspecified: Secondary | ICD-10-CM | POA: Diagnosis not present

## 2022-05-07 DIAGNOSIS — B351 Tinea unguium: Secondary | ICD-10-CM | POA: Diagnosis not present

## 2022-05-07 DIAGNOSIS — I6521 Occlusion and stenosis of right carotid artery: Secondary | ICD-10-CM | POA: Diagnosis not present

## 2022-05-07 DIAGNOSIS — E119 Type 2 diabetes mellitus without complications: Secondary | ICD-10-CM | POA: Diagnosis not present

## 2022-05-07 DIAGNOSIS — Z794 Long term (current) use of insulin: Secondary | ICD-10-CM | POA: Diagnosis not present

## 2022-05-07 DIAGNOSIS — I70235 Atherosclerosis of native arteries of right leg with ulceration of other part of foot: Secondary | ICD-10-CM | POA: Diagnosis not present

## 2022-05-07 DIAGNOSIS — R29898 Other symptoms and signs involving the musculoskeletal system: Secondary | ICD-10-CM | POA: Diagnosis not present

## 2022-05-07 DIAGNOSIS — Z85828 Personal history of other malignant neoplasm of skin: Secondary | ICD-10-CM | POA: Diagnosis not present

## 2022-05-07 DIAGNOSIS — Z86718 Personal history of other venous thrombosis and embolism: Secondary | ICD-10-CM | POA: Diagnosis not present

## 2022-05-07 DIAGNOSIS — L97521 Non-pressure chronic ulcer of other part of left foot limited to breakdown of skin: Secondary | ICD-10-CM | POA: Diagnosis not present

## 2022-05-07 DIAGNOSIS — Z89511 Acquired absence of right leg below knee: Secondary | ICD-10-CM | POA: Diagnosis not present

## 2022-05-07 DIAGNOSIS — S98112A Complete traumatic amputation of left great toe, initial encounter: Secondary | ICD-10-CM | POA: Diagnosis not present

## 2022-05-07 DIAGNOSIS — Z89412 Acquired absence of left great toe: Secondary | ICD-10-CM | POA: Diagnosis not present

## 2022-05-07 DIAGNOSIS — M79672 Pain in left foot: Secondary | ICD-10-CM | POA: Diagnosis not present

## 2022-05-07 DIAGNOSIS — I4891 Unspecified atrial fibrillation: Secondary | ICD-10-CM | POA: Diagnosis not present

## 2022-05-07 DIAGNOSIS — Z95820 Peripheral vascular angioplasty status with implants and grafts: Secondary | ICD-10-CM | POA: Diagnosis not present

## 2022-05-07 DIAGNOSIS — I96 Gangrene, not elsewhere classified: Secondary | ICD-10-CM | POA: Diagnosis not present

## 2022-05-07 DIAGNOSIS — E1152 Type 2 diabetes mellitus with diabetic peripheral angiopathy with gangrene: Secondary | ICD-10-CM | POA: Diagnosis not present

## 2022-05-07 DIAGNOSIS — I69398 Other sequelae of cerebral infarction: Secondary | ICD-10-CM | POA: Diagnosis not present

## 2022-05-08 DIAGNOSIS — M069 Rheumatoid arthritis, unspecified: Secondary | ICD-10-CM | POA: Diagnosis not present

## 2022-05-08 DIAGNOSIS — C779 Secondary and unspecified malignant neoplasm of lymph node, unspecified: Secondary | ICD-10-CM | POA: Diagnosis not present

## 2022-05-08 DIAGNOSIS — R220 Localized swelling, mass and lump, head: Secondary | ICD-10-CM | POA: Diagnosis not present

## 2022-05-08 DIAGNOSIS — Z89511 Acquired absence of right leg below knee: Secondary | ICD-10-CM | POA: Diagnosis not present

## 2022-05-08 DIAGNOSIS — D469 Myelodysplastic syndrome, unspecified: Secondary | ICD-10-CM | POA: Diagnosis not present

## 2022-05-08 DIAGNOSIS — I5022 Chronic systolic (congestive) heart failure: Secondary | ICD-10-CM | POA: Diagnosis not present

## 2022-05-08 DIAGNOSIS — L0201 Cutaneous abscess of face: Secondary | ICD-10-CM | POA: Diagnosis not present

## 2022-05-08 DIAGNOSIS — E1151 Type 2 diabetes mellitus with diabetic peripheral angiopathy without gangrene: Secondary | ICD-10-CM | POA: Diagnosis not present

## 2022-05-08 DIAGNOSIS — Z4781 Encounter for orthopedic aftercare following surgical amputation: Secondary | ICD-10-CM | POA: Diagnosis not present

## 2022-05-09 DIAGNOSIS — E1169 Type 2 diabetes mellitus with other specified complication: Secondary | ICD-10-CM | POA: Diagnosis not present

## 2022-05-09 DIAGNOSIS — I739 Peripheral vascular disease, unspecified: Secondary | ICD-10-CM | POA: Diagnosis not present

## 2022-05-09 DIAGNOSIS — R030 Elevated blood-pressure reading, without diagnosis of hypertension: Secondary | ICD-10-CM | POA: Diagnosis not present

## 2022-05-09 DIAGNOSIS — K118 Other diseases of salivary glands: Secondary | ICD-10-CM | POA: Diagnosis not present

## 2022-05-09 DIAGNOSIS — Z6823 Body mass index (BMI) 23.0-23.9, adult: Secondary | ICD-10-CM | POA: Diagnosis not present

## 2022-05-10 DIAGNOSIS — I5022 Chronic systolic (congestive) heart failure: Secondary | ICD-10-CM | POA: Diagnosis not present

## 2022-05-10 DIAGNOSIS — M069 Rheumatoid arthritis, unspecified: Secondary | ICD-10-CM | POA: Diagnosis not present

## 2022-05-10 DIAGNOSIS — D469 Myelodysplastic syndrome, unspecified: Secondary | ICD-10-CM | POA: Diagnosis not present

## 2022-05-10 DIAGNOSIS — E1151 Type 2 diabetes mellitus with diabetic peripheral angiopathy without gangrene: Secondary | ICD-10-CM | POA: Diagnosis not present

## 2022-05-10 DIAGNOSIS — Z89511 Acquired absence of right leg below knee: Secondary | ICD-10-CM | POA: Diagnosis not present

## 2022-05-10 DIAGNOSIS — Z4781 Encounter for orthopedic aftercare following surgical amputation: Secondary | ICD-10-CM | POA: Diagnosis not present

## 2022-05-12 DIAGNOSIS — B0221 Postherpetic geniculate ganglionitis: Secondary | ICD-10-CM | POA: Diagnosis not present

## 2022-05-12 DIAGNOSIS — Z7952 Long term (current) use of systemic steroids: Secondary | ICD-10-CM | POA: Diagnosis not present

## 2022-05-12 DIAGNOSIS — D696 Thrombocytopenia, unspecified: Secondary | ICD-10-CM | POA: Diagnosis not present

## 2022-05-12 DIAGNOSIS — Z4781 Encounter for orthopedic aftercare following surgical amputation: Secondary | ICD-10-CM | POA: Diagnosis not present

## 2022-05-12 DIAGNOSIS — E1140 Type 2 diabetes mellitus with diabetic neuropathy, unspecified: Secondary | ICD-10-CM | POA: Diagnosis not present

## 2022-05-12 DIAGNOSIS — I482 Chronic atrial fibrillation, unspecified: Secondary | ICD-10-CM | POA: Diagnosis not present

## 2022-05-12 DIAGNOSIS — D62 Acute posthemorrhagic anemia: Secondary | ICD-10-CM | POA: Diagnosis not present

## 2022-05-12 DIAGNOSIS — Z7901 Long term (current) use of anticoagulants: Secondary | ICD-10-CM | POA: Diagnosis not present

## 2022-05-12 DIAGNOSIS — K5900 Constipation, unspecified: Secondary | ICD-10-CM | POA: Diagnosis not present

## 2022-05-12 DIAGNOSIS — Z79891 Long term (current) use of opiate analgesic: Secondary | ICD-10-CM | POA: Diagnosis not present

## 2022-05-12 DIAGNOSIS — G4700 Insomnia, unspecified: Secondary | ICD-10-CM | POA: Diagnosis not present

## 2022-05-12 DIAGNOSIS — M1990 Unspecified osteoarthritis, unspecified site: Secondary | ICD-10-CM | POA: Diagnosis not present

## 2022-05-12 DIAGNOSIS — I5022 Chronic systolic (congestive) heart failure: Secondary | ICD-10-CM | POA: Diagnosis not present

## 2022-05-12 DIAGNOSIS — Z89511 Acquired absence of right leg below knee: Secondary | ICD-10-CM | POA: Diagnosis not present

## 2022-05-12 DIAGNOSIS — E876 Hypokalemia: Secondary | ICD-10-CM | POA: Diagnosis not present

## 2022-05-12 DIAGNOSIS — E1151 Type 2 diabetes mellitus with diabetic peripheral angiopathy without gangrene: Secondary | ICD-10-CM | POA: Diagnosis not present

## 2022-05-12 DIAGNOSIS — M069 Rheumatoid arthritis, unspecified: Secondary | ICD-10-CM | POA: Diagnosis not present

## 2022-05-12 DIAGNOSIS — I959 Hypotension, unspecified: Secondary | ICD-10-CM | POA: Diagnosis not present

## 2022-05-12 DIAGNOSIS — Z7984 Long term (current) use of oral hypoglycemic drugs: Secondary | ICD-10-CM | POA: Diagnosis not present

## 2022-05-12 DIAGNOSIS — E871 Hypo-osmolality and hyponatremia: Secondary | ICD-10-CM | POA: Diagnosis not present

## 2022-05-12 DIAGNOSIS — M1120 Other chondrocalcinosis, unspecified site: Secondary | ICD-10-CM | POA: Diagnosis not present

## 2022-05-12 DIAGNOSIS — C61 Malignant neoplasm of prostate: Secondary | ICD-10-CM | POA: Diagnosis not present

## 2022-05-12 DIAGNOSIS — M7022 Olecranon bursitis, left elbow: Secondary | ICD-10-CM | POA: Diagnosis not present

## 2022-05-12 DIAGNOSIS — D472 Monoclonal gammopathy: Secondary | ICD-10-CM | POA: Diagnosis not present

## 2022-05-12 DIAGNOSIS — D469 Myelodysplastic syndrome, unspecified: Secondary | ICD-10-CM | POA: Diagnosis not present

## 2022-05-13 DIAGNOSIS — K118 Other diseases of salivary glands: Secondary | ICD-10-CM | POA: Diagnosis not present

## 2022-05-13 DIAGNOSIS — C07 Malignant neoplasm of parotid gland: Secondary | ICD-10-CM | POA: Diagnosis not present

## 2022-05-14 DIAGNOSIS — L97529 Non-pressure chronic ulcer of other part of left foot with unspecified severity: Secondary | ICD-10-CM | POA: Diagnosis not present

## 2022-05-14 DIAGNOSIS — E1151 Type 2 diabetes mellitus with diabetic peripheral angiopathy without gangrene: Secondary | ICD-10-CM | POA: Diagnosis not present

## 2022-05-14 DIAGNOSIS — Z89511 Acquired absence of right leg below knee: Secondary | ICD-10-CM | POA: Diagnosis not present

## 2022-05-14 DIAGNOSIS — Z87891 Personal history of nicotine dependence: Secondary | ICD-10-CM | POA: Diagnosis not present

## 2022-05-14 DIAGNOSIS — D649 Anemia, unspecified: Secondary | ICD-10-CM | POA: Diagnosis present

## 2022-05-14 DIAGNOSIS — Z91041 Radiographic dye allergy status: Secondary | ICD-10-CM | POA: Diagnosis not present

## 2022-05-14 DIAGNOSIS — Z95820 Peripheral vascular angioplasty status with implants and grafts: Secondary | ICD-10-CM | POA: Diagnosis not present

## 2022-05-14 DIAGNOSIS — Z86718 Personal history of other venous thrombosis and embolism: Secondary | ICD-10-CM | POA: Diagnosis not present

## 2022-05-14 DIAGNOSIS — D469 Myelodysplastic syndrome, unspecified: Secondary | ICD-10-CM | POA: Diagnosis not present

## 2022-05-14 DIAGNOSIS — I70262 Atherosclerosis of native arteries of extremities with gangrene, left leg: Secondary | ICD-10-CM | POA: Diagnosis not present

## 2022-05-14 DIAGNOSIS — Z8673 Personal history of transient ischemic attack (TIA), and cerebral infarction without residual deficits: Secondary | ICD-10-CM | POA: Diagnosis not present

## 2022-05-14 DIAGNOSIS — I96 Gangrene, not elsewhere classified: Secondary | ICD-10-CM | POA: Diagnosis not present

## 2022-05-14 DIAGNOSIS — Z4781 Encounter for orthopedic aftercare following surgical amputation: Secondary | ICD-10-CM | POA: Diagnosis not present

## 2022-05-14 DIAGNOSIS — Z89412 Acquired absence of left great toe: Secondary | ICD-10-CM | POA: Diagnosis not present

## 2022-05-14 DIAGNOSIS — Z7901 Long term (current) use of anticoagulants: Secondary | ICD-10-CM | POA: Diagnosis not present

## 2022-05-14 DIAGNOSIS — Z89422 Acquired absence of other left toe(s): Secondary | ICD-10-CM | POA: Diagnosis not present

## 2022-05-14 DIAGNOSIS — E1165 Type 2 diabetes mellitus with hyperglycemia: Secondary | ICD-10-CM | POA: Diagnosis present

## 2022-05-14 DIAGNOSIS — Z888 Allergy status to other drugs, medicaments and biological substances status: Secondary | ICD-10-CM | POA: Diagnosis not present

## 2022-05-14 DIAGNOSIS — Z7984 Long term (current) use of oral hypoglycemic drugs: Secondary | ICD-10-CM | POA: Diagnosis not present

## 2022-05-14 DIAGNOSIS — E1152 Type 2 diabetes mellitus with diabetic peripheral angiopathy with gangrene: Secondary | ICD-10-CM | POA: Diagnosis present

## 2022-05-14 DIAGNOSIS — M7989 Other specified soft tissue disorders: Secondary | ICD-10-CM | POA: Diagnosis not present

## 2022-05-14 DIAGNOSIS — R220 Localized swelling, mass and lump, head: Secondary | ICD-10-CM | POA: Diagnosis not present

## 2022-05-14 DIAGNOSIS — Z7982 Long term (current) use of aspirin: Secondary | ICD-10-CM | POA: Diagnosis not present

## 2022-05-14 DIAGNOSIS — E118 Type 2 diabetes mellitus with unspecified complications: Secondary | ICD-10-CM | POA: Diagnosis not present

## 2022-05-14 DIAGNOSIS — K118 Other diseases of salivary glands: Secondary | ICD-10-CM | POA: Diagnosis not present

## 2022-05-14 DIAGNOSIS — Z9889 Other specified postprocedural states: Secondary | ICD-10-CM | POA: Diagnosis not present

## 2022-05-14 DIAGNOSIS — M069 Rheumatoid arthritis, unspecified: Secondary | ICD-10-CM | POA: Diagnosis not present

## 2022-05-14 DIAGNOSIS — Z7952 Long term (current) use of systemic steroids: Secondary | ICD-10-CM | POA: Diagnosis not present

## 2022-05-14 DIAGNOSIS — I70202 Unspecified atherosclerosis of native arteries of extremities, left leg: Secondary | ICD-10-CM | POA: Diagnosis not present

## 2022-05-14 DIAGNOSIS — I5022 Chronic systolic (congestive) heart failure: Secondary | ICD-10-CM | POA: Diagnosis present

## 2022-05-14 DIAGNOSIS — E1142 Type 2 diabetes mellitus with diabetic polyneuropathy: Secondary | ICD-10-CM | POA: Diagnosis not present

## 2022-05-14 DIAGNOSIS — I4819 Other persistent atrial fibrillation: Secondary | ICD-10-CM | POA: Diagnosis present

## 2022-05-16 DIAGNOSIS — E1151 Type 2 diabetes mellitus with diabetic peripheral angiopathy without gangrene: Secondary | ICD-10-CM | POA: Diagnosis not present

## 2022-05-16 DIAGNOSIS — Z89511 Acquired absence of right leg below knee: Secondary | ICD-10-CM | POA: Diagnosis not present

## 2022-05-16 DIAGNOSIS — I5022 Chronic systolic (congestive) heart failure: Secondary | ICD-10-CM | POA: Diagnosis not present

## 2022-05-16 DIAGNOSIS — Z4781 Encounter for orthopedic aftercare following surgical amputation: Secondary | ICD-10-CM | POA: Diagnosis not present

## 2022-05-16 DIAGNOSIS — M069 Rheumatoid arthritis, unspecified: Secondary | ICD-10-CM | POA: Diagnosis not present

## 2022-05-16 DIAGNOSIS — D469 Myelodysplastic syndrome, unspecified: Secondary | ICD-10-CM | POA: Diagnosis not present

## 2022-05-17 DIAGNOSIS — Z7901 Long term (current) use of anticoagulants: Secondary | ICD-10-CM | POA: Diagnosis not present

## 2022-05-17 DIAGNOSIS — Z89412 Acquired absence of left great toe: Secondary | ICD-10-CM | POA: Diagnosis not present

## 2022-05-17 DIAGNOSIS — E1152 Type 2 diabetes mellitus with diabetic peripheral angiopathy with gangrene: Secondary | ICD-10-CM | POA: Diagnosis present

## 2022-05-17 DIAGNOSIS — Z86718 Personal history of other venous thrombosis and embolism: Secondary | ICD-10-CM | POA: Diagnosis not present

## 2022-05-17 DIAGNOSIS — Z7982 Long term (current) use of aspirin: Secondary | ICD-10-CM | POA: Diagnosis not present

## 2022-05-17 DIAGNOSIS — Z7984 Long term (current) use of oral hypoglycemic drugs: Secondary | ICD-10-CM | POA: Diagnosis not present

## 2022-05-17 DIAGNOSIS — I96 Gangrene, not elsewhere classified: Secondary | ICD-10-CM | POA: Diagnosis not present

## 2022-05-17 DIAGNOSIS — D649 Anemia, unspecified: Secondary | ICD-10-CM | POA: Diagnosis present

## 2022-05-17 DIAGNOSIS — I70202 Unspecified atherosclerosis of native arteries of extremities, left leg: Secondary | ICD-10-CM | POA: Diagnosis not present

## 2022-05-17 DIAGNOSIS — E1165 Type 2 diabetes mellitus with hyperglycemia: Secondary | ICD-10-CM | POA: Diagnosis present

## 2022-05-17 DIAGNOSIS — Z91041 Radiographic dye allergy status: Secondary | ICD-10-CM | POA: Diagnosis not present

## 2022-05-17 DIAGNOSIS — L97529 Non-pressure chronic ulcer of other part of left foot with unspecified severity: Secondary | ICD-10-CM | POA: Diagnosis not present

## 2022-05-17 DIAGNOSIS — E118 Type 2 diabetes mellitus with unspecified complications: Secondary | ICD-10-CM | POA: Diagnosis not present

## 2022-05-17 DIAGNOSIS — Z888 Allergy status to other drugs, medicaments and biological substances status: Secondary | ICD-10-CM | POA: Diagnosis not present

## 2022-05-17 DIAGNOSIS — E1142 Type 2 diabetes mellitus with diabetic polyneuropathy: Secondary | ICD-10-CM | POA: Diagnosis not present

## 2022-05-17 DIAGNOSIS — Z7952 Long term (current) use of systemic steroids: Secondary | ICD-10-CM | POA: Diagnosis not present

## 2022-05-17 DIAGNOSIS — Z87891 Personal history of nicotine dependence: Secondary | ICD-10-CM | POA: Diagnosis not present

## 2022-05-17 DIAGNOSIS — M7989 Other specified soft tissue disorders: Secondary | ICD-10-CM | POA: Diagnosis not present

## 2022-05-17 DIAGNOSIS — Z9889 Other specified postprocedural states: Secondary | ICD-10-CM | POA: Diagnosis not present

## 2022-05-17 DIAGNOSIS — I5022 Chronic systolic (congestive) heart failure: Secondary | ICD-10-CM | POA: Diagnosis present

## 2022-05-17 DIAGNOSIS — Z8673 Personal history of transient ischemic attack (TIA), and cerebral infarction without residual deficits: Secondary | ICD-10-CM | POA: Diagnosis not present

## 2022-05-17 DIAGNOSIS — I70262 Atherosclerosis of native arteries of extremities with gangrene, left leg: Secondary | ICD-10-CM | POA: Diagnosis not present

## 2022-05-17 DIAGNOSIS — Z89422 Acquired absence of other left toe(s): Secondary | ICD-10-CM | POA: Diagnosis not present

## 2022-05-17 DIAGNOSIS — I4819 Other persistent atrial fibrillation: Secondary | ICD-10-CM | POA: Diagnosis present

## 2022-05-17 DIAGNOSIS — Z95820 Peripheral vascular angioplasty status with implants and grafts: Secondary | ICD-10-CM | POA: Diagnosis not present

## 2022-05-23 DIAGNOSIS — Z89432 Acquired absence of left foot: Secondary | ICD-10-CM | POA: Diagnosis not present

## 2022-05-23 DIAGNOSIS — Z87891 Personal history of nicotine dependence: Secondary | ICD-10-CM | POA: Diagnosis not present

## 2022-05-23 DIAGNOSIS — E1165 Type 2 diabetes mellitus with hyperglycemia: Secondary | ICD-10-CM | POA: Diagnosis not present

## 2022-05-23 DIAGNOSIS — M5126 Other intervertebral disc displacement, lumbar region: Secondary | ICD-10-CM | POA: Diagnosis not present

## 2022-05-23 DIAGNOSIS — D649 Anemia, unspecified: Secondary | ICD-10-CM | POA: Diagnosis not present

## 2022-05-23 DIAGNOSIS — M109 Gout, unspecified: Secondary | ICD-10-CM | POA: Diagnosis not present

## 2022-05-23 DIAGNOSIS — Z7901 Long term (current) use of anticoagulants: Secondary | ICD-10-CM | POA: Diagnosis not present

## 2022-05-23 DIAGNOSIS — E1140 Type 2 diabetes mellitus with diabetic neuropathy, unspecified: Secondary | ICD-10-CM | POA: Diagnosis not present

## 2022-05-23 DIAGNOSIS — I4819 Other persistent atrial fibrillation: Secondary | ICD-10-CM | POA: Diagnosis not present

## 2022-05-23 DIAGNOSIS — Z4781 Encounter for orthopedic aftercare following surgical amputation: Secondary | ICD-10-CM | POA: Diagnosis not present

## 2022-05-23 DIAGNOSIS — Z9181 History of falling: Secondary | ICD-10-CM | POA: Diagnosis not present

## 2022-05-23 DIAGNOSIS — Z7952 Long term (current) use of systemic steroids: Secondary | ICD-10-CM | POA: Diagnosis not present

## 2022-05-23 DIAGNOSIS — E1151 Type 2 diabetes mellitus with diabetic peripheral angiopathy without gangrene: Secondary | ICD-10-CM | POA: Diagnosis not present

## 2022-05-23 DIAGNOSIS — Z7984 Long term (current) use of oral hypoglycemic drugs: Secondary | ICD-10-CM | POA: Diagnosis not present

## 2022-05-23 DIAGNOSIS — Z7982 Long term (current) use of aspirin: Secondary | ICD-10-CM | POA: Diagnosis not present

## 2022-05-23 DIAGNOSIS — Z8673 Personal history of transient ischemic attack (TIA), and cerebral infarction without residual deficits: Secondary | ICD-10-CM | POA: Diagnosis not present

## 2022-05-24 DIAGNOSIS — Z89432 Acquired absence of left foot: Secondary | ICD-10-CM | POA: Diagnosis not present

## 2022-05-24 DIAGNOSIS — I4819 Other persistent atrial fibrillation: Secondary | ICD-10-CM | POA: Diagnosis not present

## 2022-05-24 DIAGNOSIS — E1151 Type 2 diabetes mellitus with diabetic peripheral angiopathy without gangrene: Secondary | ICD-10-CM | POA: Diagnosis not present

## 2022-05-24 DIAGNOSIS — E1140 Type 2 diabetes mellitus with diabetic neuropathy, unspecified: Secondary | ICD-10-CM | POA: Diagnosis not present

## 2022-05-24 DIAGNOSIS — E1165 Type 2 diabetes mellitus with hyperglycemia: Secondary | ICD-10-CM | POA: Diagnosis not present

## 2022-05-24 DIAGNOSIS — Z4781 Encounter for orthopedic aftercare following surgical amputation: Secondary | ICD-10-CM | POA: Diagnosis not present

## 2022-05-27 ENCOUNTER — Other Ambulatory Visit: Payer: Self-pay | Admitting: Physical Medicine and Rehabilitation

## 2022-05-27 DIAGNOSIS — C760 Malignant neoplasm of head, face and neck: Secondary | ICD-10-CM | POA: Diagnosis not present

## 2022-05-27 DIAGNOSIS — Z7901 Long term (current) use of anticoagulants: Secondary | ICD-10-CM | POA: Diagnosis not present

## 2022-05-27 DIAGNOSIS — Z7982 Long term (current) use of aspirin: Secondary | ICD-10-CM | POA: Diagnosis not present

## 2022-05-27 DIAGNOSIS — E119 Type 2 diabetes mellitus without complications: Secondary | ICD-10-CM | POA: Diagnosis not present

## 2022-05-27 DIAGNOSIS — I700 Atherosclerosis of aorta: Secondary | ICD-10-CM | POA: Diagnosis not present

## 2022-05-27 DIAGNOSIS — Z95828 Presence of other vascular implants and grafts: Secondary | ICD-10-CM | POA: Diagnosis not present

## 2022-05-27 DIAGNOSIS — C4490 Unspecified malignant neoplasm of skin, unspecified: Secondary | ICD-10-CM | POA: Diagnosis not present

## 2022-05-27 DIAGNOSIS — Z7984 Long term (current) use of oral hypoglycemic drugs: Secondary | ICD-10-CM | POA: Diagnosis not present

## 2022-05-28 DIAGNOSIS — D4620 Refractory anemia with excess of blasts, unspecified: Secondary | ICD-10-CM | POA: Diagnosis not present

## 2022-05-28 DIAGNOSIS — E1151 Type 2 diabetes mellitus with diabetic peripheral angiopathy without gangrene: Secondary | ICD-10-CM | POA: Diagnosis not present

## 2022-05-28 DIAGNOSIS — E1142 Type 2 diabetes mellitus with diabetic polyneuropathy: Secondary | ICD-10-CM | POA: Diagnosis not present

## 2022-05-28 DIAGNOSIS — E1140 Type 2 diabetes mellitus with diabetic neuropathy, unspecified: Secondary | ICD-10-CM | POA: Diagnosis not present

## 2022-05-28 DIAGNOSIS — I4891 Unspecified atrial fibrillation: Secondary | ICD-10-CM | POA: Diagnosis not present

## 2022-05-28 DIAGNOSIS — Z89432 Acquired absence of left foot: Secondary | ICD-10-CM | POA: Diagnosis not present

## 2022-05-28 DIAGNOSIS — M818 Other osteoporosis without current pathological fracture: Secondary | ICD-10-CM | POA: Diagnosis not present

## 2022-05-28 DIAGNOSIS — D638 Anemia in other chronic diseases classified elsewhere: Secondary | ICD-10-CM | POA: Diagnosis not present

## 2022-05-28 DIAGNOSIS — T380X5A Adverse effect of glucocorticoids and synthetic analogues, initial encounter: Secondary | ICD-10-CM | POA: Diagnosis not present

## 2022-05-28 DIAGNOSIS — E1165 Type 2 diabetes mellitus with hyperglycemia: Secondary | ICD-10-CM | POA: Diagnosis not present

## 2022-05-28 DIAGNOSIS — I4819 Other persistent atrial fibrillation: Secondary | ICD-10-CM | POA: Diagnosis not present

## 2022-05-28 DIAGNOSIS — D469 Myelodysplastic syndrome, unspecified: Secondary | ICD-10-CM | POA: Diagnosis not present

## 2022-05-28 DIAGNOSIS — Z4781 Encounter for orthopedic aftercare following surgical amputation: Secondary | ICD-10-CM | POA: Diagnosis not present

## 2022-05-28 DIAGNOSIS — Z1501 Genetic susceptibility to malignant neoplasm of breast: Secondary | ICD-10-CM | POA: Diagnosis not present

## 2022-05-28 DIAGNOSIS — C07 Malignant neoplasm of parotid gland: Secondary | ICD-10-CM | POA: Diagnosis not present

## 2022-05-29 DIAGNOSIS — D469 Myelodysplastic syndrome, unspecified: Secondary | ICD-10-CM | POA: Diagnosis not present

## 2022-05-29 DIAGNOSIS — D638 Anemia in other chronic diseases classified elsewhere: Secondary | ICD-10-CM | POA: Diagnosis not present

## 2022-05-30 DIAGNOSIS — C4492 Squamous cell carcinoma of skin, unspecified: Secondary | ICD-10-CM | POA: Diagnosis not present

## 2022-05-30 DIAGNOSIS — C44320 Squamous cell carcinoma of skin of unspecified parts of face: Secondary | ICD-10-CM | POA: Diagnosis not present

## 2022-05-30 DIAGNOSIS — E063 Autoimmune thyroiditis: Secondary | ICD-10-CM | POA: Diagnosis not present

## 2022-05-30 DIAGNOSIS — C07 Malignant neoplasm of parotid gland: Secondary | ICD-10-CM | POA: Diagnosis not present

## 2022-05-30 DIAGNOSIS — Z1159 Encounter for screening for other viral diseases: Secondary | ICD-10-CM | POA: Diagnosis not present

## 2022-05-31 DIAGNOSIS — E1151 Type 2 diabetes mellitus with diabetic peripheral angiopathy without gangrene: Secondary | ICD-10-CM | POA: Diagnosis not present

## 2022-05-31 DIAGNOSIS — E1140 Type 2 diabetes mellitus with diabetic neuropathy, unspecified: Secondary | ICD-10-CM | POA: Diagnosis not present

## 2022-05-31 DIAGNOSIS — Z89432 Acquired absence of left foot: Secondary | ICD-10-CM | POA: Diagnosis not present

## 2022-05-31 DIAGNOSIS — I4819 Other persistent atrial fibrillation: Secondary | ICD-10-CM | POA: Diagnosis not present

## 2022-05-31 DIAGNOSIS — E1165 Type 2 diabetes mellitus with hyperglycemia: Secondary | ICD-10-CM | POA: Diagnosis not present

## 2022-05-31 DIAGNOSIS — Z4781 Encounter for orthopedic aftercare following surgical amputation: Secondary | ICD-10-CM | POA: Diagnosis not present

## 2022-06-04 DIAGNOSIS — L97521 Non-pressure chronic ulcer of other part of left foot limited to breakdown of skin: Secondary | ICD-10-CM | POA: Diagnosis not present

## 2022-06-04 DIAGNOSIS — Z09 Encounter for follow-up examination after completed treatment for conditions other than malignant neoplasm: Secondary | ICD-10-CM | POA: Diagnosis not present

## 2022-06-04 DIAGNOSIS — M79672 Pain in left foot: Secondary | ICD-10-CM | POA: Diagnosis not present

## 2022-06-04 DIAGNOSIS — I739 Peripheral vascular disease, unspecified: Secondary | ICD-10-CM | POA: Diagnosis not present

## 2022-06-05 DIAGNOSIS — E1151 Type 2 diabetes mellitus with diabetic peripheral angiopathy without gangrene: Secondary | ICD-10-CM | POA: Diagnosis not present

## 2022-06-05 DIAGNOSIS — E1140 Type 2 diabetes mellitus with diabetic neuropathy, unspecified: Secondary | ICD-10-CM | POA: Diagnosis not present

## 2022-06-05 DIAGNOSIS — Z89432 Acquired absence of left foot: Secondary | ICD-10-CM | POA: Diagnosis not present

## 2022-06-05 DIAGNOSIS — Z4781 Encounter for orthopedic aftercare following surgical amputation: Secondary | ICD-10-CM | POA: Diagnosis not present

## 2022-06-05 DIAGNOSIS — E1165 Type 2 diabetes mellitus with hyperglycemia: Secondary | ICD-10-CM | POA: Diagnosis not present

## 2022-06-05 DIAGNOSIS — I4819 Other persistent atrial fibrillation: Secondary | ICD-10-CM | POA: Diagnosis not present

## 2022-06-06 DIAGNOSIS — C44320 Squamous cell carcinoma of skin of unspecified parts of face: Secondary | ICD-10-CM | POA: Diagnosis not present

## 2022-06-06 DIAGNOSIS — C07 Malignant neoplasm of parotid gland: Secondary | ICD-10-CM | POA: Diagnosis not present

## 2022-06-06 DIAGNOSIS — E063 Autoimmune thyroiditis: Secondary | ICD-10-CM | POA: Diagnosis not present

## 2022-06-06 DIAGNOSIS — Z1159 Encounter for screening for other viral diseases: Secondary | ICD-10-CM | POA: Diagnosis not present

## 2022-06-06 DIAGNOSIS — Z5112 Encounter for antineoplastic immunotherapy: Secondary | ICD-10-CM | POA: Diagnosis not present

## 2022-06-07 DIAGNOSIS — I4819 Other persistent atrial fibrillation: Secondary | ICD-10-CM | POA: Diagnosis not present

## 2022-06-07 DIAGNOSIS — E1151 Type 2 diabetes mellitus with diabetic peripheral angiopathy without gangrene: Secondary | ICD-10-CM | POA: Diagnosis not present

## 2022-06-07 DIAGNOSIS — E1165 Type 2 diabetes mellitus with hyperglycemia: Secondary | ICD-10-CM | POA: Diagnosis not present

## 2022-06-07 DIAGNOSIS — Z89432 Acquired absence of left foot: Secondary | ICD-10-CM | POA: Diagnosis not present

## 2022-06-07 DIAGNOSIS — E1140 Type 2 diabetes mellitus with diabetic neuropathy, unspecified: Secondary | ICD-10-CM | POA: Diagnosis not present

## 2022-06-07 DIAGNOSIS — Z4781 Encounter for orthopedic aftercare following surgical amputation: Secondary | ICD-10-CM | POA: Diagnosis not present

## 2022-06-11 DIAGNOSIS — M79672 Pain in left foot: Secondary | ICD-10-CM | POA: Diagnosis not present

## 2022-06-11 DIAGNOSIS — L97521 Non-pressure chronic ulcer of other part of left foot limited to breakdown of skin: Secondary | ICD-10-CM | POA: Diagnosis not present

## 2022-06-11 DIAGNOSIS — E1152 Type 2 diabetes mellitus with diabetic peripheral angiopathy with gangrene: Secondary | ICD-10-CM | POA: Diagnosis not present

## 2022-06-11 DIAGNOSIS — S88119A Complete traumatic amputation at level between knee and ankle, unspecified lower leg, initial encounter: Secondary | ICD-10-CM | POA: Diagnosis not present

## 2022-06-11 DIAGNOSIS — Z89412 Acquired absence of left great toe: Secondary | ICD-10-CM | POA: Diagnosis not present

## 2022-06-11 DIAGNOSIS — M869 Osteomyelitis, unspecified: Secondary | ICD-10-CM | POA: Diagnosis not present

## 2022-06-11 DIAGNOSIS — B351 Tinea unguium: Secondary | ICD-10-CM | POA: Diagnosis not present

## 2022-06-11 DIAGNOSIS — Z89511 Acquired absence of right leg below knee: Secondary | ICD-10-CM | POA: Diagnosis not present

## 2022-06-11 DIAGNOSIS — E1169 Type 2 diabetes mellitus with other specified complication: Secondary | ICD-10-CM | POA: Diagnosis not present

## 2022-06-12 DIAGNOSIS — Z89432 Acquired absence of left foot: Secondary | ICD-10-CM | POA: Diagnosis not present

## 2022-06-12 DIAGNOSIS — E1140 Type 2 diabetes mellitus with diabetic neuropathy, unspecified: Secondary | ICD-10-CM | POA: Diagnosis not present

## 2022-06-12 DIAGNOSIS — Z4781 Encounter for orthopedic aftercare following surgical amputation: Secondary | ICD-10-CM | POA: Diagnosis not present

## 2022-06-12 DIAGNOSIS — E1165 Type 2 diabetes mellitus with hyperglycemia: Secondary | ICD-10-CM | POA: Diagnosis not present

## 2022-06-12 DIAGNOSIS — I4819 Other persistent atrial fibrillation: Secondary | ICD-10-CM | POA: Diagnosis not present

## 2022-06-12 DIAGNOSIS — L089 Local infection of the skin and subcutaneous tissue, unspecified: Secondary | ICD-10-CM | POA: Diagnosis not present

## 2022-06-12 DIAGNOSIS — E1151 Type 2 diabetes mellitus with diabetic peripheral angiopathy without gangrene: Secondary | ICD-10-CM | POA: Diagnosis not present

## 2022-06-13 DIAGNOSIS — I5020 Unspecified systolic (congestive) heart failure: Secondary | ICD-10-CM | POA: Diagnosis not present

## 2022-06-13 DIAGNOSIS — I4891 Unspecified atrial fibrillation: Secondary | ICD-10-CM | POA: Diagnosis not present

## 2022-06-13 DIAGNOSIS — I10 Essential (primary) hypertension: Secondary | ICD-10-CM | POA: Diagnosis not present

## 2022-06-13 DIAGNOSIS — Z0001 Encounter for general adult medical examination with abnormal findings: Secondary | ICD-10-CM | POA: Diagnosis not present

## 2022-06-13 DIAGNOSIS — D649 Anemia, unspecified: Secondary | ICD-10-CM | POA: Diagnosis not present

## 2022-06-13 DIAGNOSIS — E1169 Type 2 diabetes mellitus with other specified complication: Secondary | ICD-10-CM | POA: Diagnosis not present

## 2022-06-13 DIAGNOSIS — E7801 Familial hypercholesterolemia: Secondary | ICD-10-CM | POA: Diagnosis not present

## 2022-06-14 DIAGNOSIS — Z4781 Encounter for orthopedic aftercare following surgical amputation: Secondary | ICD-10-CM | POA: Diagnosis not present

## 2022-06-14 DIAGNOSIS — E1151 Type 2 diabetes mellitus with diabetic peripheral angiopathy without gangrene: Secondary | ICD-10-CM | POA: Diagnosis not present

## 2022-06-14 DIAGNOSIS — E1140 Type 2 diabetes mellitus with diabetic neuropathy, unspecified: Secondary | ICD-10-CM | POA: Diagnosis not present

## 2022-06-14 DIAGNOSIS — Z89432 Acquired absence of left foot: Secondary | ICD-10-CM | POA: Diagnosis not present

## 2022-06-14 DIAGNOSIS — I4819 Other persistent atrial fibrillation: Secondary | ICD-10-CM | POA: Diagnosis not present

## 2022-06-14 DIAGNOSIS — E1165 Type 2 diabetes mellitus with hyperglycemia: Secondary | ICD-10-CM | POA: Diagnosis not present

## 2022-06-18 DIAGNOSIS — D472 Monoclonal gammopathy: Secondary | ICD-10-CM | POA: Diagnosis not present

## 2022-06-18 DIAGNOSIS — T380X5A Adverse effect of glucocorticoids and synthetic analogues, initial encounter: Secondary | ICD-10-CM | POA: Diagnosis not present

## 2022-06-18 DIAGNOSIS — D638 Anemia in other chronic diseases classified elsewhere: Secondary | ICD-10-CM | POA: Diagnosis not present

## 2022-06-18 DIAGNOSIS — M818 Other osteoporosis without current pathological fracture: Secondary | ICD-10-CM | POA: Diagnosis not present

## 2022-06-18 DIAGNOSIS — D469 Myelodysplastic syndrome, unspecified: Secondary | ICD-10-CM | POA: Diagnosis not present

## 2022-06-18 DIAGNOSIS — E1151 Type 2 diabetes mellitus with diabetic peripheral angiopathy without gangrene: Secondary | ICD-10-CM | POA: Diagnosis not present

## 2022-06-18 DIAGNOSIS — Z89432 Acquired absence of left foot: Secondary | ICD-10-CM | POA: Diagnosis not present

## 2022-06-18 DIAGNOSIS — C07 Malignant neoplasm of parotid gland: Secondary | ICD-10-CM | POA: Diagnosis not present

## 2022-06-18 DIAGNOSIS — I4819 Other persistent atrial fibrillation: Secondary | ICD-10-CM | POA: Diagnosis not present

## 2022-06-18 DIAGNOSIS — E1165 Type 2 diabetes mellitus with hyperglycemia: Secondary | ICD-10-CM | POA: Diagnosis not present

## 2022-06-18 DIAGNOSIS — E063 Autoimmune thyroiditis: Secondary | ICD-10-CM | POA: Diagnosis not present

## 2022-06-18 DIAGNOSIS — Z4781 Encounter for orthopedic aftercare following surgical amputation: Secondary | ICD-10-CM | POA: Diagnosis not present

## 2022-06-18 DIAGNOSIS — E1140 Type 2 diabetes mellitus with diabetic neuropathy, unspecified: Secondary | ICD-10-CM | POA: Diagnosis not present

## 2022-06-19 DIAGNOSIS — L97521 Non-pressure chronic ulcer of other part of left foot limited to breakdown of skin: Secondary | ICD-10-CM | POA: Diagnosis not present

## 2022-06-19 DIAGNOSIS — I739 Peripheral vascular disease, unspecified: Secondary | ICD-10-CM | POA: Diagnosis not present

## 2022-06-19 DIAGNOSIS — Z7901 Long term (current) use of anticoagulants: Secondary | ICD-10-CM | POA: Diagnosis not present

## 2022-06-19 DIAGNOSIS — Z7984 Long term (current) use of oral hypoglycemic drugs: Secondary | ICD-10-CM | POA: Diagnosis not present

## 2022-06-19 DIAGNOSIS — Z7982 Long term (current) use of aspirin: Secondary | ICD-10-CM | POA: Diagnosis not present

## 2022-06-19 DIAGNOSIS — E119 Type 2 diabetes mellitus without complications: Secondary | ICD-10-CM | POA: Diagnosis not present

## 2022-06-19 DIAGNOSIS — I6522 Occlusion and stenosis of left carotid artery: Secondary | ICD-10-CM | POA: Diagnosis not present

## 2022-06-19 DIAGNOSIS — Z79899 Other long term (current) drug therapy: Secondary | ICD-10-CM | POA: Diagnosis not present

## 2022-06-19 DIAGNOSIS — Z89511 Acquired absence of right leg below knee: Secondary | ICD-10-CM | POA: Diagnosis not present

## 2022-06-20 DIAGNOSIS — I4819 Other persistent atrial fibrillation: Secondary | ICD-10-CM | POA: Diagnosis not present

## 2022-06-20 DIAGNOSIS — D649 Anemia, unspecified: Secondary | ICD-10-CM | POA: Diagnosis not present

## 2022-06-20 DIAGNOSIS — Z89432 Acquired absence of left foot: Secondary | ICD-10-CM | POA: Diagnosis not present

## 2022-06-20 DIAGNOSIS — E1140 Type 2 diabetes mellitus with diabetic neuropathy, unspecified: Secondary | ICD-10-CM | POA: Diagnosis not present

## 2022-06-20 DIAGNOSIS — Z4781 Encounter for orthopedic aftercare following surgical amputation: Secondary | ICD-10-CM | POA: Diagnosis not present

## 2022-06-20 DIAGNOSIS — M109 Gout, unspecified: Secondary | ICD-10-CM | POA: Diagnosis not present

## 2022-06-20 DIAGNOSIS — E1151 Type 2 diabetes mellitus with diabetic peripheral angiopathy without gangrene: Secondary | ICD-10-CM | POA: Diagnosis not present

## 2022-06-20 DIAGNOSIS — E1165 Type 2 diabetes mellitus with hyperglycemia: Secondary | ICD-10-CM | POA: Diagnosis not present

## 2022-06-21 DIAGNOSIS — Z89432 Acquired absence of left foot: Secondary | ICD-10-CM | POA: Diagnosis not present

## 2022-06-21 DIAGNOSIS — Z4781 Encounter for orthopedic aftercare following surgical amputation: Secondary | ICD-10-CM | POA: Diagnosis not present

## 2022-06-21 DIAGNOSIS — E1165 Type 2 diabetes mellitus with hyperglycemia: Secondary | ICD-10-CM | POA: Diagnosis not present

## 2022-06-21 DIAGNOSIS — E1151 Type 2 diabetes mellitus with diabetic peripheral angiopathy without gangrene: Secondary | ICD-10-CM | POA: Diagnosis not present

## 2022-06-21 DIAGNOSIS — E1140 Type 2 diabetes mellitus with diabetic neuropathy, unspecified: Secondary | ICD-10-CM | POA: Diagnosis not present

## 2022-06-21 DIAGNOSIS — I4819 Other persistent atrial fibrillation: Secondary | ICD-10-CM | POA: Diagnosis not present

## 2022-06-22 DIAGNOSIS — D649 Anemia, unspecified: Secondary | ICD-10-CM | POA: Diagnosis not present

## 2022-06-22 DIAGNOSIS — Z7901 Long term (current) use of anticoagulants: Secondary | ICD-10-CM | POA: Diagnosis not present

## 2022-06-22 DIAGNOSIS — Z89432 Acquired absence of left foot: Secondary | ICD-10-CM | POA: Diagnosis not present

## 2022-06-22 DIAGNOSIS — E1165 Type 2 diabetes mellitus with hyperglycemia: Secondary | ICD-10-CM | POA: Diagnosis not present

## 2022-06-22 DIAGNOSIS — Z7982 Long term (current) use of aspirin: Secondary | ICD-10-CM | POA: Diagnosis not present

## 2022-06-22 DIAGNOSIS — Z7984 Long term (current) use of oral hypoglycemic drugs: Secondary | ICD-10-CM | POA: Diagnosis not present

## 2022-06-22 DIAGNOSIS — M109 Gout, unspecified: Secondary | ICD-10-CM | POA: Diagnosis not present

## 2022-06-22 DIAGNOSIS — I4819 Other persistent atrial fibrillation: Secondary | ICD-10-CM | POA: Diagnosis not present

## 2022-06-22 DIAGNOSIS — Z87891 Personal history of nicotine dependence: Secondary | ICD-10-CM | POA: Diagnosis not present

## 2022-06-22 DIAGNOSIS — M5126 Other intervertebral disc displacement, lumbar region: Secondary | ICD-10-CM | POA: Diagnosis not present

## 2022-06-22 DIAGNOSIS — E1151 Type 2 diabetes mellitus with diabetic peripheral angiopathy without gangrene: Secondary | ICD-10-CM | POA: Diagnosis not present

## 2022-06-22 DIAGNOSIS — Z8673 Personal history of transient ischemic attack (TIA), and cerebral infarction without residual deficits: Secondary | ICD-10-CM | POA: Diagnosis not present

## 2022-06-22 DIAGNOSIS — Z9181 History of falling: Secondary | ICD-10-CM | POA: Diagnosis not present

## 2022-06-22 DIAGNOSIS — E1140 Type 2 diabetes mellitus with diabetic neuropathy, unspecified: Secondary | ICD-10-CM | POA: Diagnosis not present

## 2022-06-22 DIAGNOSIS — Z4781 Encounter for orthopedic aftercare following surgical amputation: Secondary | ICD-10-CM | POA: Diagnosis not present

## 2022-06-22 DIAGNOSIS — Z7952 Long term (current) use of systemic steroids: Secondary | ICD-10-CM | POA: Diagnosis not present

## 2022-06-25 DIAGNOSIS — Z4781 Encounter for orthopedic aftercare following surgical amputation: Secondary | ICD-10-CM | POA: Diagnosis not present

## 2022-06-25 DIAGNOSIS — E1165 Type 2 diabetes mellitus with hyperglycemia: Secondary | ICD-10-CM | POA: Diagnosis not present

## 2022-06-25 DIAGNOSIS — I4819 Other persistent atrial fibrillation: Secondary | ICD-10-CM | POA: Diagnosis not present

## 2022-06-25 DIAGNOSIS — E1151 Type 2 diabetes mellitus with diabetic peripheral angiopathy without gangrene: Secondary | ICD-10-CM | POA: Diagnosis not present

## 2022-06-25 DIAGNOSIS — E1140 Type 2 diabetes mellitus with diabetic neuropathy, unspecified: Secondary | ICD-10-CM | POA: Diagnosis not present

## 2022-06-25 DIAGNOSIS — Z89432 Acquired absence of left foot: Secondary | ICD-10-CM | POA: Diagnosis not present

## 2022-06-26 DIAGNOSIS — E1165 Type 2 diabetes mellitus with hyperglycemia: Secondary | ICD-10-CM | POA: Diagnosis not present

## 2022-06-26 DIAGNOSIS — I4819 Other persistent atrial fibrillation: Secondary | ICD-10-CM | POA: Diagnosis not present

## 2022-06-26 DIAGNOSIS — Z4781 Encounter for orthopedic aftercare following surgical amputation: Secondary | ICD-10-CM | POA: Diagnosis not present

## 2022-06-26 DIAGNOSIS — Z89432 Acquired absence of left foot: Secondary | ICD-10-CM | POA: Diagnosis not present

## 2022-06-26 DIAGNOSIS — E1140 Type 2 diabetes mellitus with diabetic neuropathy, unspecified: Secondary | ICD-10-CM | POA: Diagnosis not present

## 2022-06-26 DIAGNOSIS — E1151 Type 2 diabetes mellitus with diabetic peripheral angiopathy without gangrene: Secondary | ICD-10-CM | POA: Diagnosis not present

## 2022-06-27 DIAGNOSIS — E1151 Type 2 diabetes mellitus with diabetic peripheral angiopathy without gangrene: Secondary | ICD-10-CM | POA: Diagnosis not present

## 2022-06-27 DIAGNOSIS — Z792 Long term (current) use of antibiotics: Secondary | ICD-10-CM | POA: Diagnosis not present

## 2022-06-27 DIAGNOSIS — C07 Malignant neoplasm of parotid gland: Secondary | ICD-10-CM | POA: Diagnosis not present

## 2022-06-27 DIAGNOSIS — Z7901 Long term (current) use of anticoagulants: Secondary | ICD-10-CM | POA: Diagnosis not present

## 2022-06-27 DIAGNOSIS — E1142 Type 2 diabetes mellitus with diabetic polyneuropathy: Secondary | ICD-10-CM | POA: Diagnosis not present

## 2022-06-27 DIAGNOSIS — Z1159 Encounter for screening for other viral diseases: Secondary | ICD-10-CM | POA: Diagnosis not present

## 2022-06-27 DIAGNOSIS — Z5112 Encounter for antineoplastic immunotherapy: Secondary | ICD-10-CM | POA: Diagnosis not present

## 2022-06-27 DIAGNOSIS — E063 Autoimmune thyroiditis: Secondary | ICD-10-CM | POA: Diagnosis not present

## 2022-06-27 DIAGNOSIS — Z7982 Long term (current) use of aspirin: Secondary | ICD-10-CM | POA: Diagnosis not present

## 2022-06-27 DIAGNOSIS — Z7984 Long term (current) use of oral hypoglycemic drugs: Secondary | ICD-10-CM | POA: Diagnosis not present

## 2022-06-27 DIAGNOSIS — C7989 Secondary malignant neoplasm of other specified sites: Secondary | ICD-10-CM | POA: Diagnosis not present

## 2022-06-27 DIAGNOSIS — C44320 Squamous cell carcinoma of skin of unspecified parts of face: Secondary | ICD-10-CM | POA: Diagnosis not present

## 2022-06-27 DIAGNOSIS — C4492 Squamous cell carcinoma of skin, unspecified: Secondary | ICD-10-CM | POA: Diagnosis not present

## 2022-06-28 DIAGNOSIS — Z4781 Encounter for orthopedic aftercare following surgical amputation: Secondary | ICD-10-CM | POA: Diagnosis not present

## 2022-06-28 DIAGNOSIS — E1140 Type 2 diabetes mellitus with diabetic neuropathy, unspecified: Secondary | ICD-10-CM | POA: Diagnosis not present

## 2022-06-28 DIAGNOSIS — E1165 Type 2 diabetes mellitus with hyperglycemia: Secondary | ICD-10-CM | POA: Diagnosis not present

## 2022-06-28 DIAGNOSIS — Z89432 Acquired absence of left foot: Secondary | ICD-10-CM | POA: Diagnosis not present

## 2022-06-28 DIAGNOSIS — I4819 Other persistent atrial fibrillation: Secondary | ICD-10-CM | POA: Diagnosis not present

## 2022-06-28 DIAGNOSIS — E1151 Type 2 diabetes mellitus with diabetic peripheral angiopathy without gangrene: Secondary | ICD-10-CM | POA: Diagnosis not present

## 2022-07-01 DIAGNOSIS — R946 Abnormal results of thyroid function studies: Secondary | ICD-10-CM | POA: Diagnosis not present

## 2022-07-01 DIAGNOSIS — E7801 Familial hypercholesterolemia: Secondary | ICD-10-CM | POA: Diagnosis not present

## 2022-07-01 DIAGNOSIS — E538 Deficiency of other specified B group vitamins: Secondary | ICD-10-CM | POA: Diagnosis not present

## 2022-07-01 DIAGNOSIS — R5383 Other fatigue: Secondary | ICD-10-CM | POA: Diagnosis not present

## 2022-07-01 DIAGNOSIS — E559 Vitamin D deficiency, unspecified: Secondary | ICD-10-CM | POA: Diagnosis not present

## 2022-07-01 DIAGNOSIS — E1169 Type 2 diabetes mellitus with other specified complication: Secondary | ICD-10-CM | POA: Diagnosis not present

## 2022-07-02 DIAGNOSIS — E1140 Type 2 diabetes mellitus with diabetic neuropathy, unspecified: Secondary | ICD-10-CM | POA: Diagnosis not present

## 2022-07-02 DIAGNOSIS — E1165 Type 2 diabetes mellitus with hyperglycemia: Secondary | ICD-10-CM | POA: Diagnosis not present

## 2022-07-02 DIAGNOSIS — Z89432 Acquired absence of left foot: Secondary | ICD-10-CM | POA: Diagnosis not present

## 2022-07-02 DIAGNOSIS — I4819 Other persistent atrial fibrillation: Secondary | ICD-10-CM | POA: Diagnosis not present

## 2022-07-02 DIAGNOSIS — E1151 Type 2 diabetes mellitus with diabetic peripheral angiopathy without gangrene: Secondary | ICD-10-CM | POA: Diagnosis not present

## 2022-07-02 DIAGNOSIS — Z4781 Encounter for orthopedic aftercare following surgical amputation: Secondary | ICD-10-CM | POA: Diagnosis not present

## 2022-07-03 DIAGNOSIS — E1165 Type 2 diabetes mellitus with hyperglycemia: Secondary | ICD-10-CM | POA: Diagnosis not present

## 2022-07-03 DIAGNOSIS — Z4781 Encounter for orthopedic aftercare following surgical amputation: Secondary | ICD-10-CM | POA: Diagnosis not present

## 2022-07-03 DIAGNOSIS — I4819 Other persistent atrial fibrillation: Secondary | ICD-10-CM | POA: Diagnosis not present

## 2022-07-03 DIAGNOSIS — Z89432 Acquired absence of left foot: Secondary | ICD-10-CM | POA: Diagnosis not present

## 2022-07-03 DIAGNOSIS — E1151 Type 2 diabetes mellitus with diabetic peripheral angiopathy without gangrene: Secondary | ICD-10-CM | POA: Diagnosis not present

## 2022-07-03 DIAGNOSIS — E1140 Type 2 diabetes mellitus with diabetic neuropathy, unspecified: Secondary | ICD-10-CM | POA: Diagnosis not present

## 2022-07-05 DIAGNOSIS — M1120 Other chondrocalcinosis, unspecified site: Secondary | ICD-10-CM | POA: Diagnosis not present

## 2022-07-05 DIAGNOSIS — D472 Monoclonal gammopathy: Secondary | ICD-10-CM | POA: Diagnosis not present

## 2022-07-05 DIAGNOSIS — Z89512 Acquired absence of left leg below knee: Secondary | ICD-10-CM | POA: Diagnosis not present

## 2022-07-05 DIAGNOSIS — Z789 Other specified health status: Secondary | ICD-10-CM | POA: Diagnosis not present

## 2022-07-05 DIAGNOSIS — Z7982 Long term (current) use of aspirin: Secondary | ICD-10-CM | POA: Diagnosis not present

## 2022-07-05 DIAGNOSIS — Z7952 Long term (current) use of systemic steroids: Secondary | ICD-10-CM | POA: Diagnosis not present

## 2022-07-05 DIAGNOSIS — T8789 Other complications of amputation stump: Secondary | ICD-10-CM | POA: Diagnosis not present

## 2022-07-05 DIAGNOSIS — G546 Phantom limb syndrome with pain: Secondary | ICD-10-CM | POA: Diagnosis not present

## 2022-07-05 DIAGNOSIS — E1151 Type 2 diabetes mellitus with diabetic peripheral angiopathy without gangrene: Secondary | ICD-10-CM | POA: Diagnosis not present

## 2022-07-05 DIAGNOSIS — Z89432 Acquired absence of left foot: Secondary | ICD-10-CM | POA: Diagnosis not present

## 2022-07-05 DIAGNOSIS — C7989 Secondary malignant neoplasm of other specified sites: Secondary | ICD-10-CM | POA: Diagnosis not present

## 2022-07-05 DIAGNOSIS — I4891 Unspecified atrial fibrillation: Secondary | ICD-10-CM | POA: Diagnosis not present

## 2022-07-05 DIAGNOSIS — I70221 Atherosclerosis of native arteries of extremities with rest pain, right leg: Secondary | ICD-10-CM | POA: Diagnosis not present

## 2022-07-05 DIAGNOSIS — G8918 Other acute postprocedural pain: Secondary | ICD-10-CM | POA: Diagnosis not present

## 2022-07-05 DIAGNOSIS — Z96653 Presence of artificial knee joint, bilateral: Secondary | ICD-10-CM | POA: Diagnosis not present

## 2022-07-05 DIAGNOSIS — Z9181 History of falling: Secondary | ICD-10-CM | POA: Diagnosis not present

## 2022-07-05 DIAGNOSIS — M6250 Muscle wasting and atrophy, not elsewhere classified, unspecified site: Secondary | ICD-10-CM | POA: Diagnosis not present

## 2022-07-05 DIAGNOSIS — C4492 Squamous cell carcinoma of skin, unspecified: Secondary | ICD-10-CM | POA: Diagnosis not present

## 2022-07-05 DIAGNOSIS — Z86718 Personal history of other venous thrombosis and embolism: Secondary | ICD-10-CM | POA: Diagnosis not present

## 2022-07-05 DIAGNOSIS — R531 Weakness: Secondary | ICD-10-CM | POA: Diagnosis not present

## 2022-07-05 DIAGNOSIS — Z89511 Acquired absence of right leg below knee: Secondary | ICD-10-CM | POA: Diagnosis not present

## 2022-07-05 DIAGNOSIS — Z87891 Personal history of nicotine dependence: Secondary | ICD-10-CM | POA: Diagnosis not present

## 2022-07-05 DIAGNOSIS — Z85828 Personal history of other malignant neoplasm of skin: Secondary | ICD-10-CM | POA: Diagnosis not present

## 2022-07-05 DIAGNOSIS — I739 Peripheral vascular disease, unspecified: Secondary | ICD-10-CM | POA: Diagnosis not present

## 2022-07-05 DIAGNOSIS — I69398 Other sequelae of cerebral infarction: Secondary | ICD-10-CM | POA: Diagnosis not present

## 2022-07-05 DIAGNOSIS — Z95828 Presence of other vascular implants and grafts: Secondary | ICD-10-CM | POA: Diagnosis not present

## 2022-07-05 DIAGNOSIS — I70222 Atherosclerosis of native arteries of extremities with rest pain, left leg: Secondary | ICD-10-CM | POA: Diagnosis not present

## 2022-07-05 DIAGNOSIS — D469 Myelodysplastic syndrome, unspecified: Secondary | ICD-10-CM | POA: Diagnosis not present

## 2022-07-05 DIAGNOSIS — I6350 Cerebral infarction due to unspecified occlusion or stenosis of unspecified cerebral artery: Secondary | ICD-10-CM | POA: Diagnosis not present

## 2022-07-05 DIAGNOSIS — E1142 Type 2 diabetes mellitus with diabetic polyneuropathy: Secondary | ICD-10-CM | POA: Diagnosis not present

## 2022-07-05 DIAGNOSIS — Z7901 Long term (current) use of anticoagulants: Secondary | ICD-10-CM | POA: Diagnosis not present

## 2022-07-05 DIAGNOSIS — Z7984 Long term (current) use of oral hypoglycemic drugs: Secondary | ICD-10-CM | POA: Diagnosis not present

## 2022-07-05 DIAGNOSIS — R208 Other disturbances of skin sensation: Secondary | ICD-10-CM | POA: Diagnosis not present

## 2022-07-05 DIAGNOSIS — M1180 Other specified crystal arthropathies, unspecified site: Secondary | ICD-10-CM | POA: Diagnosis not present

## 2022-07-08 DIAGNOSIS — E1140 Type 2 diabetes mellitus with diabetic neuropathy, unspecified: Secondary | ICD-10-CM | POA: Diagnosis not present

## 2022-07-08 DIAGNOSIS — R509 Fever, unspecified: Secondary | ICD-10-CM | POA: Diagnosis not present

## 2022-07-08 DIAGNOSIS — I4891 Unspecified atrial fibrillation: Secondary | ICD-10-CM | POA: Diagnosis not present

## 2022-07-08 DIAGNOSIS — Z7984 Long term (current) use of oral hypoglycemic drugs: Secondary | ICD-10-CM | POA: Diagnosis not present

## 2022-07-08 DIAGNOSIS — Z89512 Acquired absence of left leg below knee: Secondary | ICD-10-CM | POA: Diagnosis not present

## 2022-07-08 DIAGNOSIS — I5022 Chronic systolic (congestive) heart failure: Secondary | ICD-10-CM | POA: Diagnosis not present

## 2022-07-08 DIAGNOSIS — R259 Unspecified abnormal involuntary movements: Secondary | ICD-10-CM | POA: Diagnosis not present

## 2022-07-08 DIAGNOSIS — Z7982 Long term (current) use of aspirin: Secondary | ICD-10-CM | POA: Diagnosis not present

## 2022-07-08 DIAGNOSIS — E1169 Type 2 diabetes mellitus with other specified complication: Secondary | ICD-10-CM | POA: Diagnosis not present

## 2022-07-08 DIAGNOSIS — Z4781 Encounter for orthopedic aftercare following surgical amputation: Secondary | ICD-10-CM | POA: Diagnosis not present

## 2022-07-08 DIAGNOSIS — Z789 Other specified health status: Secondary | ICD-10-CM | POA: Diagnosis not present

## 2022-07-08 DIAGNOSIS — M25422 Effusion, left elbow: Secondary | ICD-10-CM | POA: Diagnosis not present

## 2022-07-08 DIAGNOSIS — Z1152 Encounter for screening for COVID-19: Secondary | ICD-10-CM | POA: Diagnosis not present

## 2022-07-08 DIAGNOSIS — D469 Myelodysplastic syndrome, unspecified: Secondary | ICD-10-CM | POA: Diagnosis not present

## 2022-07-08 DIAGNOSIS — C4492 Squamous cell carcinoma of skin, unspecified: Secondary | ICD-10-CM | POA: Diagnosis not present

## 2022-07-08 DIAGNOSIS — M869 Osteomyelitis, unspecified: Secondary | ICD-10-CM | POA: Diagnosis not present

## 2022-07-08 DIAGNOSIS — I70221 Atherosclerosis of native arteries of extremities with rest pain, right leg: Secondary | ICD-10-CM | POA: Diagnosis not present

## 2022-07-08 DIAGNOSIS — I739 Peripheral vascular disease, unspecified: Secondary | ICD-10-CM | POA: Diagnosis not present

## 2022-07-08 DIAGNOSIS — E1151 Type 2 diabetes mellitus with diabetic peripheral angiopathy without gangrene: Secondary | ICD-10-CM | POA: Diagnosis not present

## 2022-07-08 DIAGNOSIS — R531 Weakness: Secondary | ICD-10-CM | POA: Diagnosis not present

## 2022-07-08 DIAGNOSIS — R7402 Elevation of levels of lactic acid dehydrogenase (LDH): Secondary | ICD-10-CM | POA: Diagnosis not present

## 2022-07-08 DIAGNOSIS — M1180 Other specified crystal arthropathies, unspecified site: Secondary | ICD-10-CM | POA: Diagnosis not present

## 2022-07-08 DIAGNOSIS — I6350 Cerebral infarction due to unspecified occlusion or stenosis of unspecified cerebral artery: Secondary | ICD-10-CM | POA: Diagnosis not present

## 2022-07-08 DIAGNOSIS — R600 Localized edema: Secondary | ICD-10-CM | POA: Diagnosis not present

## 2022-07-08 DIAGNOSIS — R569 Unspecified convulsions: Secondary | ICD-10-CM | POA: Diagnosis not present

## 2022-07-08 DIAGNOSIS — I70223 Atherosclerosis of native arteries of extremities with rest pain, bilateral legs: Secondary | ICD-10-CM | POA: Diagnosis not present

## 2022-07-08 DIAGNOSIS — D8481 Immunodeficiency due to conditions classified elsewhere: Secondary | ICD-10-CM | POA: Diagnosis not present

## 2022-07-08 DIAGNOSIS — E1142 Type 2 diabetes mellitus with diabetic polyneuropathy: Secondary | ICD-10-CM | POA: Diagnosis not present

## 2022-07-08 DIAGNOSIS — Z87891 Personal history of nicotine dependence: Secondary | ICD-10-CM | POA: Diagnosis not present

## 2022-07-08 DIAGNOSIS — C7989 Secondary malignant neoplasm of other specified sites: Secondary | ICD-10-CM | POA: Diagnosis not present

## 2022-07-08 DIAGNOSIS — M25421 Effusion, right elbow: Secondary | ICD-10-CM | POA: Diagnosis not present

## 2022-07-08 DIAGNOSIS — M6250 Muscle wasting and atrophy, not elsewhere classified, unspecified site: Secondary | ICD-10-CM | POA: Diagnosis not present

## 2022-07-08 DIAGNOSIS — G546 Phantom limb syndrome with pain: Secondary | ICD-10-CM | POA: Diagnosis not present

## 2022-07-08 DIAGNOSIS — T879 Unspecified complications of amputation stump: Secondary | ICD-10-CM | POA: Diagnosis not present

## 2022-07-08 DIAGNOSIS — D649 Anemia, unspecified: Secondary | ICD-10-CM | POA: Diagnosis not present

## 2022-07-08 DIAGNOSIS — Z89511 Acquired absence of right leg below knee: Secondary | ICD-10-CM | POA: Diagnosis not present

## 2022-07-08 DIAGNOSIS — Z7952 Long term (current) use of systemic steroids: Secondary | ICD-10-CM | POA: Diagnosis not present

## 2022-07-08 DIAGNOSIS — R7989 Other specified abnormal findings of blood chemistry: Secondary | ICD-10-CM | POA: Diagnosis not present

## 2022-07-08 DIAGNOSIS — I5020 Unspecified systolic (congestive) heart failure: Secondary | ICD-10-CM | POA: Diagnosis not present

## 2022-07-08 DIAGNOSIS — G8918 Other acute postprocedural pain: Secondary | ICD-10-CM | POA: Diagnosis not present

## 2022-07-09 DIAGNOSIS — Z89512 Acquired absence of left leg below knee: Secondary | ICD-10-CM | POA: Diagnosis not present

## 2022-07-09 DIAGNOSIS — I70223 Atherosclerosis of native arteries of extremities with rest pain, bilateral legs: Secondary | ICD-10-CM | POA: Diagnosis not present

## 2022-07-09 DIAGNOSIS — E1140 Type 2 diabetes mellitus with diabetic neuropathy, unspecified: Secondary | ICD-10-CM | POA: Diagnosis not present

## 2022-07-09 DIAGNOSIS — Z89511 Acquired absence of right leg below knee: Secondary | ICD-10-CM | POA: Diagnosis not present

## 2022-07-09 DIAGNOSIS — C4492 Squamous cell carcinoma of skin, unspecified: Secondary | ICD-10-CM | POA: Diagnosis not present

## 2022-07-09 DIAGNOSIS — I4891 Unspecified atrial fibrillation: Secondary | ICD-10-CM | POA: Diagnosis not present

## 2022-07-09 DIAGNOSIS — G8918 Other acute postprocedural pain: Secondary | ICD-10-CM | POA: Diagnosis not present

## 2022-07-09 DIAGNOSIS — D469 Myelodysplastic syndrome, unspecified: Secondary | ICD-10-CM | POA: Diagnosis not present

## 2022-07-09 DIAGNOSIS — G546 Phantom limb syndrome with pain: Secondary | ICD-10-CM | POA: Diagnosis not present

## 2022-07-09 DIAGNOSIS — M1180 Other specified crystal arthropathies, unspecified site: Secondary | ICD-10-CM | POA: Diagnosis not present

## 2022-07-09 DIAGNOSIS — C7989 Secondary malignant neoplasm of other specified sites: Secondary | ICD-10-CM | POA: Diagnosis not present

## 2022-07-10 DIAGNOSIS — M1180 Other specified crystal arthropathies, unspecified site: Secondary | ICD-10-CM | POA: Diagnosis not present

## 2022-07-10 DIAGNOSIS — C7989 Secondary malignant neoplasm of other specified sites: Secondary | ICD-10-CM | POA: Diagnosis not present

## 2022-07-10 DIAGNOSIS — I70223 Atherosclerosis of native arteries of extremities with rest pain, bilateral legs: Secondary | ICD-10-CM | POA: Diagnosis not present

## 2022-07-10 DIAGNOSIS — G8918 Other acute postprocedural pain: Secondary | ICD-10-CM | POA: Diagnosis not present

## 2022-07-10 DIAGNOSIS — D469 Myelodysplastic syndrome, unspecified: Secondary | ICD-10-CM | POA: Diagnosis not present

## 2022-07-10 DIAGNOSIS — E1140 Type 2 diabetes mellitus with diabetic neuropathy, unspecified: Secondary | ICD-10-CM | POA: Diagnosis not present

## 2022-07-10 DIAGNOSIS — C4492 Squamous cell carcinoma of skin, unspecified: Secondary | ICD-10-CM | POA: Diagnosis not present

## 2022-07-10 DIAGNOSIS — Z89511 Acquired absence of right leg below knee: Secondary | ICD-10-CM | POA: Diagnosis not present

## 2022-07-10 DIAGNOSIS — Z89512 Acquired absence of left leg below knee: Secondary | ICD-10-CM | POA: Diagnosis not present

## 2022-07-10 DIAGNOSIS — I4891 Unspecified atrial fibrillation: Secondary | ICD-10-CM | POA: Diagnosis not present

## 2022-07-10 DIAGNOSIS — G546 Phantom limb syndrome with pain: Secondary | ICD-10-CM | POA: Diagnosis not present

## 2022-07-11 DIAGNOSIS — G8918 Other acute postprocedural pain: Secondary | ICD-10-CM | POA: Diagnosis not present

## 2022-07-11 DIAGNOSIS — C7989 Secondary malignant neoplasm of other specified sites: Secondary | ICD-10-CM | POA: Diagnosis not present

## 2022-07-11 DIAGNOSIS — I4891 Unspecified atrial fibrillation: Secondary | ICD-10-CM | POA: Diagnosis not present

## 2022-07-11 DIAGNOSIS — Z89511 Acquired absence of right leg below knee: Secondary | ICD-10-CM | POA: Diagnosis not present

## 2022-07-11 DIAGNOSIS — E1140 Type 2 diabetes mellitus with diabetic neuropathy, unspecified: Secondary | ICD-10-CM | POA: Diagnosis not present

## 2022-07-11 DIAGNOSIS — M1180 Other specified crystal arthropathies, unspecified site: Secondary | ICD-10-CM | POA: Diagnosis not present

## 2022-07-11 DIAGNOSIS — Z89512 Acquired absence of left leg below knee: Secondary | ICD-10-CM | POA: Diagnosis not present

## 2022-07-11 DIAGNOSIS — C4492 Squamous cell carcinoma of skin, unspecified: Secondary | ICD-10-CM | POA: Diagnosis not present

## 2022-07-11 DIAGNOSIS — G546 Phantom limb syndrome with pain: Secondary | ICD-10-CM | POA: Diagnosis not present

## 2022-07-11 DIAGNOSIS — I70223 Atherosclerosis of native arteries of extremities with rest pain, bilateral legs: Secondary | ICD-10-CM | POA: Diagnosis not present

## 2022-07-11 DIAGNOSIS — D469 Myelodysplastic syndrome, unspecified: Secondary | ICD-10-CM | POA: Diagnosis not present

## 2022-07-12 DIAGNOSIS — M1180 Other specified crystal arthropathies, unspecified site: Secondary | ICD-10-CM | POA: Diagnosis not present

## 2022-07-12 DIAGNOSIS — C7989 Secondary malignant neoplasm of other specified sites: Secondary | ICD-10-CM | POA: Diagnosis not present

## 2022-07-12 DIAGNOSIS — Z89512 Acquired absence of left leg below knee: Secondary | ICD-10-CM | POA: Diagnosis not present

## 2022-07-12 DIAGNOSIS — I70223 Atherosclerosis of native arteries of extremities with rest pain, bilateral legs: Secondary | ICD-10-CM | POA: Diagnosis not present

## 2022-07-12 DIAGNOSIS — I4891 Unspecified atrial fibrillation: Secondary | ICD-10-CM | POA: Diagnosis not present

## 2022-07-12 DIAGNOSIS — G546 Phantom limb syndrome with pain: Secondary | ICD-10-CM | POA: Diagnosis not present

## 2022-07-12 DIAGNOSIS — Z89511 Acquired absence of right leg below knee: Secondary | ICD-10-CM | POA: Diagnosis not present

## 2022-07-12 DIAGNOSIS — D469 Myelodysplastic syndrome, unspecified: Secondary | ICD-10-CM | POA: Diagnosis not present

## 2022-07-12 DIAGNOSIS — C4492 Squamous cell carcinoma of skin, unspecified: Secondary | ICD-10-CM | POA: Diagnosis not present

## 2022-07-12 DIAGNOSIS — G8918 Other acute postprocedural pain: Secondary | ICD-10-CM | POA: Diagnosis not present

## 2022-07-12 DIAGNOSIS — E1140 Type 2 diabetes mellitus with diabetic neuropathy, unspecified: Secondary | ICD-10-CM | POA: Diagnosis not present

## 2022-07-13 DIAGNOSIS — D469 Myelodysplastic syndrome, unspecified: Secondary | ICD-10-CM | POA: Diagnosis not present

## 2022-07-13 DIAGNOSIS — M1180 Other specified crystal arthropathies, unspecified site: Secondary | ICD-10-CM | POA: Diagnosis not present

## 2022-07-13 DIAGNOSIS — I4891 Unspecified atrial fibrillation: Secondary | ICD-10-CM | POA: Diagnosis not present

## 2022-07-13 DIAGNOSIS — E1140 Type 2 diabetes mellitus with diabetic neuropathy, unspecified: Secondary | ICD-10-CM | POA: Diagnosis not present

## 2022-07-13 DIAGNOSIS — M25421 Effusion, right elbow: Secondary | ICD-10-CM | POA: Diagnosis not present

## 2022-07-13 DIAGNOSIS — G546 Phantom limb syndrome with pain: Secondary | ICD-10-CM | POA: Diagnosis not present

## 2022-07-13 DIAGNOSIS — I70223 Atherosclerosis of native arteries of extremities with rest pain, bilateral legs: Secondary | ICD-10-CM | POA: Diagnosis not present

## 2022-07-13 DIAGNOSIS — C7989 Secondary malignant neoplasm of other specified sites: Secondary | ICD-10-CM | POA: Diagnosis not present

## 2022-07-13 DIAGNOSIS — Z89511 Acquired absence of right leg below knee: Secondary | ICD-10-CM | POA: Diagnosis not present

## 2022-07-13 DIAGNOSIS — C4492 Squamous cell carcinoma of skin, unspecified: Secondary | ICD-10-CM | POA: Diagnosis not present

## 2022-07-13 DIAGNOSIS — Z89512 Acquired absence of left leg below knee: Secondary | ICD-10-CM | POA: Diagnosis not present

## 2022-07-13 DIAGNOSIS — G8918 Other acute postprocedural pain: Secondary | ICD-10-CM | POA: Diagnosis not present

## 2022-07-14 DIAGNOSIS — M1180 Other specified crystal arthropathies, unspecified site: Secondary | ICD-10-CM | POA: Diagnosis not present

## 2022-07-14 DIAGNOSIS — C7989 Secondary malignant neoplasm of other specified sites: Secondary | ICD-10-CM | POA: Diagnosis not present

## 2022-07-14 DIAGNOSIS — E1140 Type 2 diabetes mellitus with diabetic neuropathy, unspecified: Secondary | ICD-10-CM | POA: Diagnosis not present

## 2022-07-14 DIAGNOSIS — Z89511 Acquired absence of right leg below knee: Secondary | ICD-10-CM | POA: Diagnosis not present

## 2022-07-14 DIAGNOSIS — G8918 Other acute postprocedural pain: Secondary | ICD-10-CM | POA: Diagnosis not present

## 2022-07-14 DIAGNOSIS — G546 Phantom limb syndrome with pain: Secondary | ICD-10-CM | POA: Diagnosis not present

## 2022-07-14 DIAGNOSIS — Z89512 Acquired absence of left leg below knee: Secondary | ICD-10-CM | POA: Diagnosis not present

## 2022-07-14 DIAGNOSIS — C4492 Squamous cell carcinoma of skin, unspecified: Secondary | ICD-10-CM | POA: Diagnosis not present

## 2022-07-14 DIAGNOSIS — I70223 Atherosclerosis of native arteries of extremities with rest pain, bilateral legs: Secondary | ICD-10-CM | POA: Diagnosis not present

## 2022-07-14 DIAGNOSIS — I4891 Unspecified atrial fibrillation: Secondary | ICD-10-CM | POA: Diagnosis not present

## 2022-07-14 DIAGNOSIS — D469 Myelodysplastic syndrome, unspecified: Secondary | ICD-10-CM | POA: Diagnosis not present

## 2022-07-15 DIAGNOSIS — G546 Phantom limb syndrome with pain: Secondary | ICD-10-CM | POA: Diagnosis not present

## 2022-07-15 DIAGNOSIS — C7989 Secondary malignant neoplasm of other specified sites: Secondary | ICD-10-CM | POA: Diagnosis not present

## 2022-07-15 DIAGNOSIS — I70223 Atherosclerosis of native arteries of extremities with rest pain, bilateral legs: Secondary | ICD-10-CM | POA: Diagnosis not present

## 2022-07-15 DIAGNOSIS — D469 Myelodysplastic syndrome, unspecified: Secondary | ICD-10-CM | POA: Diagnosis not present

## 2022-07-15 DIAGNOSIS — C4492 Squamous cell carcinoma of skin, unspecified: Secondary | ICD-10-CM | POA: Diagnosis not present

## 2022-07-15 DIAGNOSIS — Z89511 Acquired absence of right leg below knee: Secondary | ICD-10-CM | POA: Diagnosis not present

## 2022-07-15 DIAGNOSIS — Z89512 Acquired absence of left leg below knee: Secondary | ICD-10-CM | POA: Diagnosis not present

## 2022-07-15 DIAGNOSIS — E1140 Type 2 diabetes mellitus with diabetic neuropathy, unspecified: Secondary | ICD-10-CM | POA: Diagnosis not present

## 2022-07-15 DIAGNOSIS — G8918 Other acute postprocedural pain: Secondary | ICD-10-CM | POA: Diagnosis not present

## 2022-07-15 DIAGNOSIS — M1180 Other specified crystal arthropathies, unspecified site: Secondary | ICD-10-CM | POA: Diagnosis not present

## 2022-07-15 DIAGNOSIS — I4891 Unspecified atrial fibrillation: Secondary | ICD-10-CM | POA: Diagnosis not present

## 2022-07-16 DIAGNOSIS — C7989 Secondary malignant neoplasm of other specified sites: Secondary | ICD-10-CM | POA: Diagnosis not present

## 2022-07-16 DIAGNOSIS — D469 Myelodysplastic syndrome, unspecified: Secondary | ICD-10-CM | POA: Diagnosis not present

## 2022-07-16 DIAGNOSIS — M1180 Other specified crystal arthropathies, unspecified site: Secondary | ICD-10-CM | POA: Diagnosis not present

## 2022-07-16 DIAGNOSIS — I4891 Unspecified atrial fibrillation: Secondary | ICD-10-CM | POA: Diagnosis not present

## 2022-07-16 DIAGNOSIS — C4492 Squamous cell carcinoma of skin, unspecified: Secondary | ICD-10-CM | POA: Diagnosis not present

## 2022-07-16 DIAGNOSIS — E1140 Type 2 diabetes mellitus with diabetic neuropathy, unspecified: Secondary | ICD-10-CM | POA: Diagnosis not present

## 2022-07-16 DIAGNOSIS — Z89511 Acquired absence of right leg below knee: Secondary | ICD-10-CM | POA: Diagnosis not present

## 2022-07-16 DIAGNOSIS — Z89512 Acquired absence of left leg below knee: Secondary | ICD-10-CM | POA: Diagnosis not present

## 2022-07-16 DIAGNOSIS — I70223 Atherosclerosis of native arteries of extremities with rest pain, bilateral legs: Secondary | ICD-10-CM | POA: Diagnosis not present

## 2022-07-16 DIAGNOSIS — G8918 Other acute postprocedural pain: Secondary | ICD-10-CM | POA: Diagnosis not present

## 2022-07-16 DIAGNOSIS — G546 Phantom limb syndrome with pain: Secondary | ICD-10-CM | POA: Diagnosis not present

## 2022-07-17 DIAGNOSIS — E1140 Type 2 diabetes mellitus with diabetic neuropathy, unspecified: Secondary | ICD-10-CM | POA: Diagnosis not present

## 2022-07-17 DIAGNOSIS — I70223 Atherosclerosis of native arteries of extremities with rest pain, bilateral legs: Secondary | ICD-10-CM | POA: Diagnosis not present

## 2022-07-17 DIAGNOSIS — D469 Myelodysplastic syndrome, unspecified: Secondary | ICD-10-CM | POA: Diagnosis not present

## 2022-07-17 DIAGNOSIS — C4492 Squamous cell carcinoma of skin, unspecified: Secondary | ICD-10-CM | POA: Diagnosis not present

## 2022-07-17 DIAGNOSIS — G8918 Other acute postprocedural pain: Secondary | ICD-10-CM | POA: Diagnosis not present

## 2022-07-17 DIAGNOSIS — Z89511 Acquired absence of right leg below knee: Secondary | ICD-10-CM | POA: Diagnosis not present

## 2022-07-17 DIAGNOSIS — I4891 Unspecified atrial fibrillation: Secondary | ICD-10-CM | POA: Diagnosis not present

## 2022-07-17 DIAGNOSIS — G546 Phantom limb syndrome with pain: Secondary | ICD-10-CM | POA: Diagnosis not present

## 2022-07-17 DIAGNOSIS — Z89512 Acquired absence of left leg below knee: Secondary | ICD-10-CM | POA: Diagnosis not present

## 2022-07-17 DIAGNOSIS — M1180 Other specified crystal arthropathies, unspecified site: Secondary | ICD-10-CM | POA: Diagnosis not present

## 2022-07-17 DIAGNOSIS — C7989 Secondary malignant neoplasm of other specified sites: Secondary | ICD-10-CM | POA: Diagnosis not present

## 2022-07-18 DIAGNOSIS — Z89511 Acquired absence of right leg below knee: Secondary | ICD-10-CM | POA: Diagnosis not present

## 2022-07-18 DIAGNOSIS — I4891 Unspecified atrial fibrillation: Secondary | ICD-10-CM | POA: Diagnosis not present

## 2022-07-18 DIAGNOSIS — I70223 Atherosclerosis of native arteries of extremities with rest pain, bilateral legs: Secondary | ICD-10-CM | POA: Diagnosis not present

## 2022-07-18 DIAGNOSIS — C4492 Squamous cell carcinoma of skin, unspecified: Secondary | ICD-10-CM | POA: Diagnosis not present

## 2022-07-18 DIAGNOSIS — D469 Myelodysplastic syndrome, unspecified: Secondary | ICD-10-CM | POA: Diagnosis not present

## 2022-07-18 DIAGNOSIS — C7989 Secondary malignant neoplasm of other specified sites: Secondary | ICD-10-CM | POA: Diagnosis not present

## 2022-07-18 DIAGNOSIS — G8918 Other acute postprocedural pain: Secondary | ICD-10-CM | POA: Diagnosis not present

## 2022-07-18 DIAGNOSIS — M1180 Other specified crystal arthropathies, unspecified site: Secondary | ICD-10-CM | POA: Diagnosis not present

## 2022-07-18 DIAGNOSIS — G546 Phantom limb syndrome with pain: Secondary | ICD-10-CM | POA: Diagnosis not present

## 2022-07-18 DIAGNOSIS — Z89512 Acquired absence of left leg below knee: Secondary | ICD-10-CM | POA: Diagnosis not present

## 2022-07-18 DIAGNOSIS — E1140 Type 2 diabetes mellitus with diabetic neuropathy, unspecified: Secondary | ICD-10-CM | POA: Diagnosis not present

## 2022-07-19 DIAGNOSIS — G546 Phantom limb syndrome with pain: Secondary | ICD-10-CM | POA: Diagnosis not present

## 2022-07-19 DIAGNOSIS — C4492 Squamous cell carcinoma of skin, unspecified: Secondary | ICD-10-CM | POA: Diagnosis not present

## 2022-07-19 DIAGNOSIS — E1140 Type 2 diabetes mellitus with diabetic neuropathy, unspecified: Secondary | ICD-10-CM | POA: Diagnosis not present

## 2022-07-19 DIAGNOSIS — C7989 Secondary malignant neoplasm of other specified sites: Secondary | ICD-10-CM | POA: Diagnosis not present

## 2022-07-19 DIAGNOSIS — M1180 Other specified crystal arthropathies, unspecified site: Secondary | ICD-10-CM | POA: Diagnosis not present

## 2022-07-19 DIAGNOSIS — I4891 Unspecified atrial fibrillation: Secondary | ICD-10-CM | POA: Diagnosis not present

## 2022-07-19 DIAGNOSIS — Z89511 Acquired absence of right leg below knee: Secondary | ICD-10-CM | POA: Diagnosis not present

## 2022-07-19 DIAGNOSIS — I70223 Atherosclerosis of native arteries of extremities with rest pain, bilateral legs: Secondary | ICD-10-CM | POA: Diagnosis not present

## 2022-07-19 DIAGNOSIS — G8918 Other acute postprocedural pain: Secondary | ICD-10-CM | POA: Diagnosis not present

## 2022-07-19 DIAGNOSIS — Z89512 Acquired absence of left leg below knee: Secondary | ICD-10-CM | POA: Diagnosis not present

## 2022-07-19 DIAGNOSIS — D469 Myelodysplastic syndrome, unspecified: Secondary | ICD-10-CM | POA: Diagnosis not present

## 2022-07-20 DIAGNOSIS — I4891 Unspecified atrial fibrillation: Secondary | ICD-10-CM | POA: Diagnosis not present

## 2022-07-20 DIAGNOSIS — C7989 Secondary malignant neoplasm of other specified sites: Secondary | ICD-10-CM | POA: Diagnosis not present

## 2022-07-20 DIAGNOSIS — Z89511 Acquired absence of right leg below knee: Secondary | ICD-10-CM | POA: Diagnosis not present

## 2022-07-20 DIAGNOSIS — D469 Myelodysplastic syndrome, unspecified: Secondary | ICD-10-CM | POA: Diagnosis not present

## 2022-07-20 DIAGNOSIS — E1140 Type 2 diabetes mellitus with diabetic neuropathy, unspecified: Secondary | ICD-10-CM | POA: Diagnosis not present

## 2022-07-20 DIAGNOSIS — M1180 Other specified crystal arthropathies, unspecified site: Secondary | ICD-10-CM | POA: Diagnosis not present

## 2022-07-20 DIAGNOSIS — Z89512 Acquired absence of left leg below knee: Secondary | ICD-10-CM | POA: Diagnosis not present

## 2022-07-20 DIAGNOSIS — I70223 Atherosclerosis of native arteries of extremities with rest pain, bilateral legs: Secondary | ICD-10-CM | POA: Diagnosis not present

## 2022-07-20 DIAGNOSIS — G546 Phantom limb syndrome with pain: Secondary | ICD-10-CM | POA: Diagnosis not present

## 2022-07-20 DIAGNOSIS — G8918 Other acute postprocedural pain: Secondary | ICD-10-CM | POA: Diagnosis not present

## 2022-07-20 DIAGNOSIS — C4492 Squamous cell carcinoma of skin, unspecified: Secondary | ICD-10-CM | POA: Diagnosis not present

## 2022-07-21 DIAGNOSIS — M1180 Other specified crystal arthropathies, unspecified site: Secondary | ICD-10-CM | POA: Diagnosis not present

## 2022-07-21 DIAGNOSIS — I70223 Atherosclerosis of native arteries of extremities with rest pain, bilateral legs: Secondary | ICD-10-CM | POA: Diagnosis not present

## 2022-07-21 DIAGNOSIS — Z89512 Acquired absence of left leg below knee: Secondary | ICD-10-CM | POA: Diagnosis not present

## 2022-07-21 DIAGNOSIS — I4891 Unspecified atrial fibrillation: Secondary | ICD-10-CM | POA: Diagnosis not present

## 2022-07-21 DIAGNOSIS — C4492 Squamous cell carcinoma of skin, unspecified: Secondary | ICD-10-CM | POA: Diagnosis not present

## 2022-07-21 DIAGNOSIS — C7989 Secondary malignant neoplasm of other specified sites: Secondary | ICD-10-CM | POA: Diagnosis not present

## 2022-07-21 DIAGNOSIS — G8918 Other acute postprocedural pain: Secondary | ICD-10-CM | POA: Diagnosis not present

## 2022-07-21 DIAGNOSIS — E1140 Type 2 diabetes mellitus with diabetic neuropathy, unspecified: Secondary | ICD-10-CM | POA: Diagnosis not present

## 2022-07-21 DIAGNOSIS — Z89511 Acquired absence of right leg below knee: Secondary | ICD-10-CM | POA: Diagnosis not present

## 2022-07-21 DIAGNOSIS — G546 Phantom limb syndrome with pain: Secondary | ICD-10-CM | POA: Diagnosis not present

## 2022-07-21 DIAGNOSIS — D469 Myelodysplastic syndrome, unspecified: Secondary | ICD-10-CM | POA: Diagnosis not present

## 2022-07-22 DIAGNOSIS — I70223 Atherosclerosis of native arteries of extremities with rest pain, bilateral legs: Secondary | ICD-10-CM | POA: Diagnosis not present

## 2022-07-22 DIAGNOSIS — Z89512 Acquired absence of left leg below knee: Secondary | ICD-10-CM | POA: Diagnosis not present

## 2022-07-22 DIAGNOSIS — C4492 Squamous cell carcinoma of skin, unspecified: Secondary | ICD-10-CM | POA: Diagnosis not present

## 2022-07-22 DIAGNOSIS — I4891 Unspecified atrial fibrillation: Secondary | ICD-10-CM | POA: Diagnosis not present

## 2022-07-22 DIAGNOSIS — G546 Phantom limb syndrome with pain: Secondary | ICD-10-CM | POA: Diagnosis not present

## 2022-07-22 DIAGNOSIS — Z89511 Acquired absence of right leg below knee: Secondary | ICD-10-CM | POA: Diagnosis not present

## 2022-07-22 DIAGNOSIS — E1140 Type 2 diabetes mellitus with diabetic neuropathy, unspecified: Secondary | ICD-10-CM | POA: Diagnosis not present

## 2022-07-22 DIAGNOSIS — D469 Myelodysplastic syndrome, unspecified: Secondary | ICD-10-CM | POA: Diagnosis not present

## 2022-07-22 DIAGNOSIS — M1180 Other specified crystal arthropathies, unspecified site: Secondary | ICD-10-CM | POA: Diagnosis not present

## 2022-07-22 DIAGNOSIS — C7989 Secondary malignant neoplasm of other specified sites: Secondary | ICD-10-CM | POA: Diagnosis not present

## 2022-07-22 DIAGNOSIS — G8918 Other acute postprocedural pain: Secondary | ICD-10-CM | POA: Diagnosis not present

## 2022-07-23 DIAGNOSIS — C7989 Secondary malignant neoplasm of other specified sites: Secondary | ICD-10-CM | POA: Diagnosis not present

## 2022-07-23 DIAGNOSIS — C4492 Squamous cell carcinoma of skin, unspecified: Secondary | ICD-10-CM | POA: Diagnosis not present

## 2022-07-23 DIAGNOSIS — D469 Myelodysplastic syndrome, unspecified: Secondary | ICD-10-CM | POA: Diagnosis not present

## 2022-07-23 DIAGNOSIS — G546 Phantom limb syndrome with pain: Secondary | ICD-10-CM | POA: Diagnosis not present

## 2022-07-23 DIAGNOSIS — G8918 Other acute postprocedural pain: Secondary | ICD-10-CM | POA: Diagnosis not present

## 2022-07-23 DIAGNOSIS — Z89512 Acquired absence of left leg below knee: Secondary | ICD-10-CM | POA: Diagnosis not present

## 2022-07-23 DIAGNOSIS — M1180 Other specified crystal arthropathies, unspecified site: Secondary | ICD-10-CM | POA: Diagnosis not present

## 2022-07-23 DIAGNOSIS — I4891 Unspecified atrial fibrillation: Secondary | ICD-10-CM | POA: Diagnosis not present

## 2022-07-23 DIAGNOSIS — Z89511 Acquired absence of right leg below knee: Secondary | ICD-10-CM | POA: Diagnosis not present

## 2022-07-23 DIAGNOSIS — I70223 Atherosclerosis of native arteries of extremities with rest pain, bilateral legs: Secondary | ICD-10-CM | POA: Diagnosis not present

## 2022-07-23 DIAGNOSIS — E1140 Type 2 diabetes mellitus with diabetic neuropathy, unspecified: Secondary | ICD-10-CM | POA: Diagnosis not present

## 2022-07-24 DIAGNOSIS — I70223 Atherosclerosis of native arteries of extremities with rest pain, bilateral legs: Secondary | ICD-10-CM | POA: Diagnosis not present

## 2022-07-24 DIAGNOSIS — E1140 Type 2 diabetes mellitus with diabetic neuropathy, unspecified: Secondary | ICD-10-CM | POA: Diagnosis not present

## 2022-07-24 DIAGNOSIS — G546 Phantom limb syndrome with pain: Secondary | ICD-10-CM | POA: Diagnosis not present

## 2022-07-24 DIAGNOSIS — M1180 Other specified crystal arthropathies, unspecified site: Secondary | ICD-10-CM | POA: Diagnosis not present

## 2022-07-24 DIAGNOSIS — D469 Myelodysplastic syndrome, unspecified: Secondary | ICD-10-CM | POA: Diagnosis not present

## 2022-07-24 DIAGNOSIS — C4492 Squamous cell carcinoma of skin, unspecified: Secondary | ICD-10-CM | POA: Diagnosis not present

## 2022-07-24 DIAGNOSIS — C7989 Secondary malignant neoplasm of other specified sites: Secondary | ICD-10-CM | POA: Diagnosis not present

## 2022-07-24 DIAGNOSIS — Z89512 Acquired absence of left leg below knee: Secondary | ICD-10-CM | POA: Diagnosis not present

## 2022-07-24 DIAGNOSIS — Z89511 Acquired absence of right leg below knee: Secondary | ICD-10-CM | POA: Diagnosis not present

## 2022-07-24 DIAGNOSIS — I4891 Unspecified atrial fibrillation: Secondary | ICD-10-CM | POA: Diagnosis not present

## 2022-07-24 DIAGNOSIS — G8918 Other acute postprocedural pain: Secondary | ICD-10-CM | POA: Diagnosis not present

## 2022-07-25 DIAGNOSIS — I70223 Atherosclerosis of native arteries of extremities with rest pain, bilateral legs: Secondary | ICD-10-CM | POA: Diagnosis not present

## 2022-07-25 DIAGNOSIS — Z89511 Acquired absence of right leg below knee: Secondary | ICD-10-CM | POA: Diagnosis not present

## 2022-07-25 DIAGNOSIS — C7989 Secondary malignant neoplasm of other specified sites: Secondary | ICD-10-CM | POA: Diagnosis not present

## 2022-07-25 DIAGNOSIS — G546 Phantom limb syndrome with pain: Secondary | ICD-10-CM | POA: Diagnosis not present

## 2022-07-25 DIAGNOSIS — D469 Myelodysplastic syndrome, unspecified: Secondary | ICD-10-CM | POA: Diagnosis not present

## 2022-07-25 DIAGNOSIS — M1180 Other specified crystal arthropathies, unspecified site: Secondary | ICD-10-CM | POA: Diagnosis not present

## 2022-07-25 DIAGNOSIS — E1140 Type 2 diabetes mellitus with diabetic neuropathy, unspecified: Secondary | ICD-10-CM | POA: Diagnosis not present

## 2022-07-25 DIAGNOSIS — I4891 Unspecified atrial fibrillation: Secondary | ICD-10-CM | POA: Diagnosis not present

## 2022-07-25 DIAGNOSIS — M25421 Effusion, right elbow: Secondary | ICD-10-CM | POA: Diagnosis not present

## 2022-07-25 DIAGNOSIS — Z89512 Acquired absence of left leg below knee: Secondary | ICD-10-CM | POA: Diagnosis not present

## 2022-07-25 DIAGNOSIS — G8918 Other acute postprocedural pain: Secondary | ICD-10-CM | POA: Diagnosis not present

## 2022-07-25 DIAGNOSIS — C4492 Squamous cell carcinoma of skin, unspecified: Secondary | ICD-10-CM | POA: Diagnosis not present

## 2022-07-26 DIAGNOSIS — G546 Phantom limb syndrome with pain: Secondary | ICD-10-CM | POA: Diagnosis not present

## 2022-07-26 DIAGNOSIS — G8918 Other acute postprocedural pain: Secondary | ICD-10-CM | POA: Diagnosis not present

## 2022-07-26 DIAGNOSIS — C4492 Squamous cell carcinoma of skin, unspecified: Secondary | ICD-10-CM | POA: Diagnosis not present

## 2022-07-26 DIAGNOSIS — I70223 Atherosclerosis of native arteries of extremities with rest pain, bilateral legs: Secondary | ICD-10-CM | POA: Diagnosis not present

## 2022-07-26 DIAGNOSIS — D469 Myelodysplastic syndrome, unspecified: Secondary | ICD-10-CM | POA: Diagnosis not present

## 2022-07-26 DIAGNOSIS — C7989 Secondary malignant neoplasm of other specified sites: Secondary | ICD-10-CM | POA: Diagnosis not present

## 2022-07-26 DIAGNOSIS — E1140 Type 2 diabetes mellitus with diabetic neuropathy, unspecified: Secondary | ICD-10-CM | POA: Diagnosis not present

## 2022-07-26 DIAGNOSIS — Z89512 Acquired absence of left leg below knee: Secondary | ICD-10-CM | POA: Diagnosis not present

## 2022-07-26 DIAGNOSIS — M25421 Effusion, right elbow: Secondary | ICD-10-CM | POA: Diagnosis not present

## 2022-07-26 DIAGNOSIS — M1180 Other specified crystal arthropathies, unspecified site: Secondary | ICD-10-CM | POA: Diagnosis not present

## 2022-07-26 DIAGNOSIS — Z89511 Acquired absence of right leg below knee: Secondary | ICD-10-CM | POA: Diagnosis not present

## 2022-07-26 DIAGNOSIS — I4891 Unspecified atrial fibrillation: Secondary | ICD-10-CM | POA: Diagnosis not present

## 2022-07-27 DIAGNOSIS — Z89512 Acquired absence of left leg below knee: Secondary | ICD-10-CM | POA: Diagnosis not present

## 2022-07-27 DIAGNOSIS — D469 Myelodysplastic syndrome, unspecified: Secondary | ICD-10-CM | POA: Diagnosis not present

## 2022-07-27 DIAGNOSIS — E1140 Type 2 diabetes mellitus with diabetic neuropathy, unspecified: Secondary | ICD-10-CM | POA: Diagnosis not present

## 2022-07-27 DIAGNOSIS — C4492 Squamous cell carcinoma of skin, unspecified: Secondary | ICD-10-CM | POA: Diagnosis not present

## 2022-07-27 DIAGNOSIS — M25422 Effusion, left elbow: Secondary | ICD-10-CM | POA: Diagnosis not present

## 2022-07-27 DIAGNOSIS — G8918 Other acute postprocedural pain: Secondary | ICD-10-CM | POA: Diagnosis not present

## 2022-07-27 DIAGNOSIS — I70223 Atherosclerosis of native arteries of extremities with rest pain, bilateral legs: Secondary | ICD-10-CM | POA: Diagnosis not present

## 2022-07-27 DIAGNOSIS — G546 Phantom limb syndrome with pain: Secondary | ICD-10-CM | POA: Diagnosis not present

## 2022-07-27 DIAGNOSIS — C7989 Secondary malignant neoplasm of other specified sites: Secondary | ICD-10-CM | POA: Diagnosis not present

## 2022-07-27 DIAGNOSIS — Z89511 Acquired absence of right leg below knee: Secondary | ICD-10-CM | POA: Diagnosis not present

## 2022-07-27 DIAGNOSIS — I4891 Unspecified atrial fibrillation: Secondary | ICD-10-CM | POA: Diagnosis not present

## 2022-07-27 DIAGNOSIS — M25421 Effusion, right elbow: Secondary | ICD-10-CM | POA: Diagnosis not present

## 2022-07-28 DIAGNOSIS — M1180 Other specified crystal arthropathies, unspecified site: Secondary | ICD-10-CM | POA: Diagnosis not present

## 2022-07-28 DIAGNOSIS — I70223 Atherosclerosis of native arteries of extremities with rest pain, bilateral legs: Secondary | ICD-10-CM | POA: Diagnosis not present

## 2022-07-28 DIAGNOSIS — Z89511 Acquired absence of right leg below knee: Secondary | ICD-10-CM | POA: Diagnosis not present

## 2022-07-28 DIAGNOSIS — M25421 Effusion, right elbow: Secondary | ICD-10-CM | POA: Diagnosis not present

## 2022-07-28 DIAGNOSIS — G8918 Other acute postprocedural pain: Secondary | ICD-10-CM | POA: Diagnosis not present

## 2022-07-28 DIAGNOSIS — I4891 Unspecified atrial fibrillation: Secondary | ICD-10-CM | POA: Diagnosis not present

## 2022-07-28 DIAGNOSIS — G546 Phantom limb syndrome with pain: Secondary | ICD-10-CM | POA: Diagnosis not present

## 2022-07-28 DIAGNOSIS — Z89512 Acquired absence of left leg below knee: Secondary | ICD-10-CM | POA: Diagnosis not present

## 2022-07-28 DIAGNOSIS — C4492 Squamous cell carcinoma of skin, unspecified: Secondary | ICD-10-CM | POA: Diagnosis not present

## 2022-07-28 DIAGNOSIS — D469 Myelodysplastic syndrome, unspecified: Secondary | ICD-10-CM | POA: Diagnosis not present

## 2022-07-28 DIAGNOSIS — C7989 Secondary malignant neoplasm of other specified sites: Secondary | ICD-10-CM | POA: Diagnosis not present

## 2022-07-28 DIAGNOSIS — E1140 Type 2 diabetes mellitus with diabetic neuropathy, unspecified: Secondary | ICD-10-CM | POA: Diagnosis not present

## 2022-07-29 DIAGNOSIS — Z89511 Acquired absence of right leg below knee: Secondary | ICD-10-CM | POA: Diagnosis not present

## 2022-07-29 DIAGNOSIS — I5020 Unspecified systolic (congestive) heart failure: Secondary | ICD-10-CM | POA: Diagnosis not present

## 2022-07-29 DIAGNOSIS — C7989 Secondary malignant neoplasm of other specified sites: Secondary | ICD-10-CM | POA: Diagnosis not present

## 2022-07-29 DIAGNOSIS — M25421 Effusion, right elbow: Secondary | ICD-10-CM | POA: Diagnosis not present

## 2022-07-29 DIAGNOSIS — I739 Peripheral vascular disease, unspecified: Secondary | ICD-10-CM | POA: Diagnosis not present

## 2022-07-29 DIAGNOSIS — G546 Phantom limb syndrome with pain: Secondary | ICD-10-CM | POA: Diagnosis not present

## 2022-07-29 DIAGNOSIS — R509 Fever, unspecified: Secondary | ICD-10-CM | POA: Diagnosis not present

## 2022-07-29 DIAGNOSIS — I70223 Atherosclerosis of native arteries of extremities with rest pain, bilateral legs: Secondary | ICD-10-CM | POA: Diagnosis not present

## 2022-07-29 DIAGNOSIS — Z89512 Acquired absence of left leg below knee: Secondary | ICD-10-CM | POA: Diagnosis not present

## 2022-07-29 DIAGNOSIS — G8918 Other acute postprocedural pain: Secondary | ICD-10-CM | POA: Diagnosis not present

## 2022-07-29 DIAGNOSIS — I4891 Unspecified atrial fibrillation: Secondary | ICD-10-CM | POA: Diagnosis not present

## 2022-07-29 DIAGNOSIS — E1140 Type 2 diabetes mellitus with diabetic neuropathy, unspecified: Secondary | ICD-10-CM | POA: Diagnosis not present

## 2022-07-29 DIAGNOSIS — D469 Myelodysplastic syndrome, unspecified: Secondary | ICD-10-CM | POA: Diagnosis not present

## 2022-07-29 DIAGNOSIS — C4492 Squamous cell carcinoma of skin, unspecified: Secondary | ICD-10-CM | POA: Diagnosis not present

## 2022-07-29 DIAGNOSIS — M1180 Other specified crystal arthropathies, unspecified site: Secondary | ICD-10-CM | POA: Diagnosis not present

## 2022-07-30 DIAGNOSIS — T8744 Infection of amputation stump, left lower extremity: Secondary | ICD-10-CM | POA: Diagnosis not present

## 2022-07-30 DIAGNOSIS — I5020 Unspecified systolic (congestive) heart failure: Secondary | ICD-10-CM | POA: Diagnosis not present

## 2022-07-30 DIAGNOSIS — Z87891 Personal history of nicotine dependence: Secondary | ICD-10-CM | POA: Diagnosis not present

## 2022-07-30 DIAGNOSIS — C7989 Secondary malignant neoplasm of other specified sites: Secondary | ICD-10-CM | POA: Diagnosis not present

## 2022-07-30 DIAGNOSIS — G546 Phantom limb syndrome with pain: Secondary | ICD-10-CM | POA: Diagnosis not present

## 2022-07-30 DIAGNOSIS — D469 Myelodysplastic syndrome, unspecified: Secondary | ICD-10-CM | POA: Diagnosis not present

## 2022-07-30 DIAGNOSIS — I739 Peripheral vascular disease, unspecified: Secondary | ICD-10-CM | POA: Diagnosis not present

## 2022-07-30 DIAGNOSIS — E1151 Type 2 diabetes mellitus with diabetic peripheral angiopathy without gangrene: Secondary | ICD-10-CM | POA: Diagnosis not present

## 2022-07-30 DIAGNOSIS — D472 Monoclonal gammopathy: Secondary | ICD-10-CM | POA: Diagnosis not present

## 2022-07-30 DIAGNOSIS — L02416 Cutaneous abscess of left lower limb: Secondary | ICD-10-CM | POA: Diagnosis not present

## 2022-07-30 DIAGNOSIS — C4492 Squamous cell carcinoma of skin, unspecified: Secondary | ICD-10-CM | POA: Diagnosis not present

## 2022-07-30 DIAGNOSIS — Z8546 Personal history of malignant neoplasm of prostate: Secondary | ICD-10-CM | POA: Diagnosis not present

## 2022-07-30 DIAGNOSIS — E1142 Type 2 diabetes mellitus with diabetic polyneuropathy: Secondary | ICD-10-CM | POA: Diagnosis not present

## 2022-07-30 DIAGNOSIS — Z7901 Long term (current) use of anticoagulants: Secondary | ICD-10-CM | POA: Diagnosis not present

## 2022-07-30 DIAGNOSIS — R509 Fever, unspecified: Secondary | ICD-10-CM | POA: Diagnosis not present

## 2022-07-30 DIAGNOSIS — Z7984 Long term (current) use of oral hypoglycemic drugs: Secondary | ICD-10-CM | POA: Diagnosis not present

## 2022-07-30 DIAGNOSIS — I5022 Chronic systolic (congestive) heart failure: Secondary | ICD-10-CM | POA: Diagnosis not present

## 2022-07-30 DIAGNOSIS — R609 Edema, unspecified: Secondary | ICD-10-CM | POA: Diagnosis not present

## 2022-07-30 DIAGNOSIS — Z89511 Acquired absence of right leg below knee: Secondary | ICD-10-CM | POA: Diagnosis not present

## 2022-07-30 DIAGNOSIS — Z7982 Long term (current) use of aspirin: Secondary | ICD-10-CM | POA: Diagnosis not present

## 2022-07-30 DIAGNOSIS — I4891 Unspecified atrial fibrillation: Secondary | ICD-10-CM | POA: Diagnosis not present

## 2022-07-30 DIAGNOSIS — Z794 Long term (current) use of insulin: Secondary | ICD-10-CM | POA: Diagnosis not present

## 2022-07-30 DIAGNOSIS — Z8673 Personal history of transient ischemic attack (TIA), and cerebral infarction without residual deficits: Secondary | ICD-10-CM | POA: Diagnosis not present

## 2022-07-30 DIAGNOSIS — Z7952 Long term (current) use of systemic steroids: Secondary | ICD-10-CM | POA: Diagnosis not present

## 2022-07-30 DIAGNOSIS — M86152 Other acute osteomyelitis, left femur: Secondary | ICD-10-CM | POA: Diagnosis not present

## 2022-08-01 DIAGNOSIS — M86152 Other acute osteomyelitis, left femur: Secondary | ICD-10-CM | POA: Diagnosis not present

## 2022-08-05 DIAGNOSIS — Z7901 Long term (current) use of anticoagulants: Secondary | ICD-10-CM | POA: Diagnosis not present

## 2022-08-05 DIAGNOSIS — I4819 Other persistent atrial fibrillation: Secondary | ICD-10-CM | POA: Diagnosis not present

## 2022-08-05 DIAGNOSIS — Z87891 Personal history of nicotine dependence: Secondary | ICD-10-CM | POA: Diagnosis not present

## 2022-08-05 DIAGNOSIS — M179 Osteoarthritis of knee, unspecified: Secondary | ICD-10-CM | POA: Diagnosis not present

## 2022-08-05 DIAGNOSIS — E1142 Type 2 diabetes mellitus with diabetic polyneuropathy: Secondary | ICD-10-CM | POA: Diagnosis not present

## 2022-08-05 DIAGNOSIS — E1151 Type 2 diabetes mellitus with diabetic peripheral angiopathy without gangrene: Secondary | ICD-10-CM | POA: Diagnosis not present

## 2022-08-05 DIAGNOSIS — Z89512 Acquired absence of left leg below knee: Secondary | ICD-10-CM | POA: Diagnosis not present

## 2022-08-05 DIAGNOSIS — C4492 Squamous cell carcinoma of skin, unspecified: Secondary | ICD-10-CM | POA: Diagnosis not present

## 2022-08-05 DIAGNOSIS — C7989 Secondary malignant neoplasm of other specified sites: Secondary | ICD-10-CM | POA: Diagnosis not present

## 2022-08-05 DIAGNOSIS — Z8546 Personal history of malignant neoplasm of prostate: Secondary | ICD-10-CM | POA: Diagnosis not present

## 2022-08-05 DIAGNOSIS — M5020 Other cervical disc displacement, unspecified cervical region: Secondary | ICD-10-CM | POA: Diagnosis not present

## 2022-08-05 DIAGNOSIS — D472 Monoclonal gammopathy: Secondary | ICD-10-CM | POA: Diagnosis not present

## 2022-08-05 DIAGNOSIS — D469 Myelodysplastic syndrome, unspecified: Secondary | ICD-10-CM | POA: Diagnosis not present

## 2022-08-05 DIAGNOSIS — D630 Anemia in neoplastic disease: Secondary | ICD-10-CM | POA: Diagnosis not present

## 2022-08-05 DIAGNOSIS — Z7982 Long term (current) use of aspirin: Secondary | ICD-10-CM | POA: Diagnosis not present

## 2022-08-05 DIAGNOSIS — Z89511 Acquired absence of right leg below knee: Secondary | ICD-10-CM | POA: Diagnosis not present

## 2022-08-06 DIAGNOSIS — D472 Monoclonal gammopathy: Secondary | ICD-10-CM | POA: Diagnosis not present

## 2022-08-06 DIAGNOSIS — E1151 Type 2 diabetes mellitus with diabetic peripheral angiopathy without gangrene: Secondary | ICD-10-CM | POA: Diagnosis not present

## 2022-08-06 DIAGNOSIS — C4492 Squamous cell carcinoma of skin, unspecified: Secondary | ICD-10-CM | POA: Diagnosis not present

## 2022-08-06 DIAGNOSIS — I4819 Other persistent atrial fibrillation: Secondary | ICD-10-CM | POA: Diagnosis not present

## 2022-08-06 DIAGNOSIS — E1142 Type 2 diabetes mellitus with diabetic polyneuropathy: Secondary | ICD-10-CM | POA: Diagnosis not present

## 2022-08-06 DIAGNOSIS — D469 Myelodysplastic syndrome, unspecified: Secondary | ICD-10-CM | POA: Diagnosis not present

## 2022-08-07 DIAGNOSIS — Z89512 Acquired absence of left leg below knee: Secondary | ICD-10-CM | POA: Diagnosis not present

## 2022-08-07 DIAGNOSIS — Z09 Encounter for follow-up examination after completed treatment for conditions other than malignant neoplasm: Secondary | ICD-10-CM | POA: Diagnosis not present

## 2022-08-13 DIAGNOSIS — I4819 Other persistent atrial fibrillation: Secondary | ICD-10-CM | POA: Diagnosis not present

## 2022-08-13 DIAGNOSIS — C4492 Squamous cell carcinoma of skin, unspecified: Secondary | ICD-10-CM | POA: Diagnosis not present

## 2022-08-13 DIAGNOSIS — E1142 Type 2 diabetes mellitus with diabetic polyneuropathy: Secondary | ICD-10-CM | POA: Diagnosis not present

## 2022-08-13 DIAGNOSIS — E1151 Type 2 diabetes mellitus with diabetic peripheral angiopathy without gangrene: Secondary | ICD-10-CM | POA: Diagnosis not present

## 2022-08-13 DIAGNOSIS — D469 Myelodysplastic syndrome, unspecified: Secondary | ICD-10-CM | POA: Diagnosis not present

## 2022-08-13 DIAGNOSIS — D472 Monoclonal gammopathy: Secondary | ICD-10-CM | POA: Diagnosis not present

## 2022-08-14 DIAGNOSIS — D469 Myelodysplastic syndrome, unspecified: Secondary | ICD-10-CM | POA: Diagnosis not present

## 2022-08-15 DIAGNOSIS — Z87891 Personal history of nicotine dependence: Secondary | ICD-10-CM | POA: Diagnosis not present

## 2022-08-15 DIAGNOSIS — Z91041 Radiographic dye allergy status: Secondary | ICD-10-CM | POA: Diagnosis not present

## 2022-08-15 DIAGNOSIS — Z1159 Encounter for screening for other viral diseases: Secondary | ICD-10-CM | POA: Diagnosis not present

## 2022-08-15 DIAGNOSIS — Z86718 Personal history of other venous thrombosis and embolism: Secondary | ICD-10-CM | POA: Diagnosis not present

## 2022-08-15 DIAGNOSIS — Z888 Allergy status to other drugs, medicaments and biological substances status: Secondary | ICD-10-CM | POA: Diagnosis not present

## 2022-08-15 DIAGNOSIS — E063 Autoimmune thyroiditis: Secondary | ICD-10-CM | POA: Diagnosis not present

## 2022-08-15 DIAGNOSIS — Z95828 Presence of other vascular implants and grafts: Secondary | ICD-10-CM | POA: Diagnosis not present

## 2022-08-15 DIAGNOSIS — Z794 Long term (current) use of insulin: Secondary | ICD-10-CM | POA: Diagnosis not present

## 2022-08-15 DIAGNOSIS — Z7982 Long term (current) use of aspirin: Secondary | ICD-10-CM | POA: Diagnosis not present

## 2022-08-15 DIAGNOSIS — C07 Malignant neoplasm of parotid gland: Secondary | ICD-10-CM | POA: Diagnosis not present

## 2022-08-15 DIAGNOSIS — C4492 Squamous cell carcinoma of skin, unspecified: Secondary | ICD-10-CM | POA: Diagnosis not present

## 2022-08-15 DIAGNOSIS — C44320 Squamous cell carcinoma of skin of unspecified parts of face: Secondary | ICD-10-CM | POA: Diagnosis not present

## 2022-08-15 DIAGNOSIS — E119 Type 2 diabetes mellitus without complications: Secondary | ICD-10-CM | POA: Diagnosis not present

## 2022-08-16 DIAGNOSIS — C4492 Squamous cell carcinoma of skin, unspecified: Secondary | ICD-10-CM | POA: Diagnosis not present

## 2022-08-16 DIAGNOSIS — E1151 Type 2 diabetes mellitus with diabetic peripheral angiopathy without gangrene: Secondary | ICD-10-CM | POA: Diagnosis not present

## 2022-08-16 DIAGNOSIS — T380X5A Adverse effect of glucocorticoids and synthetic analogues, initial encounter: Secondary | ICD-10-CM | POA: Diagnosis not present

## 2022-08-16 DIAGNOSIS — M818 Other osteoporosis without current pathological fracture: Secondary | ICD-10-CM | POA: Diagnosis not present

## 2022-08-16 DIAGNOSIS — I4819 Other persistent atrial fibrillation: Secondary | ICD-10-CM | POA: Diagnosis not present

## 2022-08-16 DIAGNOSIS — E1142 Type 2 diabetes mellitus with diabetic polyneuropathy: Secondary | ICD-10-CM | POA: Diagnosis not present

## 2022-08-16 DIAGNOSIS — D469 Myelodysplastic syndrome, unspecified: Secondary | ICD-10-CM | POA: Diagnosis not present

## 2022-08-16 DIAGNOSIS — D472 Monoclonal gammopathy: Secondary | ICD-10-CM | POA: Diagnosis not present

## 2022-08-19 DIAGNOSIS — S88111D Complete traumatic amputation at level between knee and ankle, right lower leg, subsequent encounter: Secondary | ICD-10-CM | POA: Diagnosis not present

## 2022-08-19 DIAGNOSIS — S88112D Complete traumatic amputation at level between knee and ankle, left lower leg, subsequent encounter: Secondary | ICD-10-CM | POA: Diagnosis not present

## 2022-08-20 DIAGNOSIS — E119 Type 2 diabetes mellitus without complications: Secondary | ICD-10-CM | POA: Diagnosis not present

## 2022-08-20 DIAGNOSIS — S88112D Complete traumatic amputation at level between knee and ankle, left lower leg, subsequent encounter: Secondary | ICD-10-CM | POA: Diagnosis not present

## 2022-08-20 DIAGNOSIS — C07 Malignant neoplasm of parotid gland: Secondary | ICD-10-CM | POA: Diagnosis not present

## 2022-08-20 DIAGNOSIS — Z89512 Acquired absence of left leg below knee: Secondary | ICD-10-CM | POA: Diagnosis not present

## 2022-08-21 DIAGNOSIS — E1142 Type 2 diabetes mellitus with diabetic polyneuropathy: Secondary | ICD-10-CM | POA: Diagnosis not present

## 2022-08-21 DIAGNOSIS — D472 Monoclonal gammopathy: Secondary | ICD-10-CM | POA: Diagnosis not present

## 2022-08-21 DIAGNOSIS — C4492 Squamous cell carcinoma of skin, unspecified: Secondary | ICD-10-CM | POA: Diagnosis not present

## 2022-08-21 DIAGNOSIS — D469 Myelodysplastic syndrome, unspecified: Secondary | ICD-10-CM | POA: Diagnosis not present

## 2022-08-21 DIAGNOSIS — I4819 Other persistent atrial fibrillation: Secondary | ICD-10-CM | POA: Diagnosis not present

## 2022-08-21 DIAGNOSIS — E1151 Type 2 diabetes mellitus with diabetic peripheral angiopathy without gangrene: Secondary | ICD-10-CM | POA: Diagnosis not present

## 2022-08-22 DIAGNOSIS — E1151 Type 2 diabetes mellitus with diabetic peripheral angiopathy without gangrene: Secondary | ICD-10-CM | POA: Diagnosis not present

## 2022-08-22 DIAGNOSIS — D469 Myelodysplastic syndrome, unspecified: Secondary | ICD-10-CM | POA: Diagnosis not present

## 2022-08-22 DIAGNOSIS — D472 Monoclonal gammopathy: Secondary | ICD-10-CM | POA: Diagnosis not present

## 2022-08-22 DIAGNOSIS — I4819 Other persistent atrial fibrillation: Secondary | ICD-10-CM | POA: Diagnosis not present

## 2022-08-22 DIAGNOSIS — C4492 Squamous cell carcinoma of skin, unspecified: Secondary | ICD-10-CM | POA: Diagnosis not present

## 2022-08-22 DIAGNOSIS — E1142 Type 2 diabetes mellitus with diabetic polyneuropathy: Secondary | ICD-10-CM | POA: Diagnosis not present

## 2022-08-26 DIAGNOSIS — Z8673 Personal history of transient ischemic attack (TIA), and cerebral infarction without residual deficits: Secondary | ICD-10-CM | POA: Diagnosis not present

## 2022-08-26 DIAGNOSIS — C44320 Squamous cell carcinoma of skin of unspecified parts of face: Secondary | ICD-10-CM | POA: Diagnosis not present

## 2022-08-26 DIAGNOSIS — D630 Anemia in neoplastic disease: Secondary | ICD-10-CM | POA: Diagnosis not present

## 2022-08-26 DIAGNOSIS — Z91041 Radiographic dye allergy status: Secondary | ICD-10-CM | POA: Diagnosis not present

## 2022-08-26 DIAGNOSIS — Z86718 Personal history of other venous thrombosis and embolism: Secondary | ICD-10-CM | POA: Diagnosis not present

## 2022-08-26 DIAGNOSIS — Z888 Allergy status to other drugs, medicaments and biological substances status: Secondary | ICD-10-CM | POA: Diagnosis not present

## 2022-08-26 DIAGNOSIS — Z87891 Personal history of nicotine dependence: Secondary | ICD-10-CM | POA: Diagnosis not present

## 2022-08-26 DIAGNOSIS — Z7984 Long term (current) use of oral hypoglycemic drugs: Secondary | ICD-10-CM | POA: Diagnosis not present

## 2022-08-26 DIAGNOSIS — R600 Localized edema: Secondary | ICD-10-CM | POA: Diagnosis not present

## 2022-08-26 DIAGNOSIS — I4891 Unspecified atrial fibrillation: Secondary | ICD-10-CM | POA: Diagnosis not present

## 2022-08-26 DIAGNOSIS — Z8546 Personal history of malignant neoplasm of prostate: Secondary | ICD-10-CM | POA: Diagnosis not present

## 2022-08-26 DIAGNOSIS — S88111D Complete traumatic amputation at level between knee and ankle, right lower leg, subsequent encounter: Secondary | ICD-10-CM | POA: Diagnosis not present

## 2022-08-26 DIAGNOSIS — Z7952 Long term (current) use of systemic steroids: Secondary | ICD-10-CM | POA: Diagnosis not present

## 2022-08-26 DIAGNOSIS — D469 Myelodysplastic syndrome, unspecified: Secondary | ICD-10-CM | POA: Diagnosis not present

## 2022-08-26 DIAGNOSIS — E119 Type 2 diabetes mellitus without complications: Secondary | ICD-10-CM | POA: Diagnosis not present

## 2022-08-26 DIAGNOSIS — Z510 Encounter for antineoplastic radiation therapy: Secondary | ICD-10-CM | POA: Diagnosis not present

## 2022-08-26 DIAGNOSIS — Z7982 Long term (current) use of aspirin: Secondary | ICD-10-CM | POA: Diagnosis not present

## 2022-08-26 DIAGNOSIS — Z95828 Presence of other vascular implants and grafts: Secondary | ICD-10-CM | POA: Diagnosis not present

## 2022-08-26 DIAGNOSIS — Z79899 Other long term (current) drug therapy: Secondary | ICD-10-CM | POA: Diagnosis not present

## 2022-08-26 DIAGNOSIS — Z7901 Long term (current) use of anticoagulants: Secondary | ICD-10-CM | POA: Diagnosis not present

## 2022-08-26 DIAGNOSIS — C4492 Squamous cell carcinoma of skin, unspecified: Secondary | ICD-10-CM | POA: Diagnosis not present

## 2022-08-26 DIAGNOSIS — K118 Other diseases of salivary glands: Secondary | ICD-10-CM | POA: Diagnosis not present

## 2022-08-26 DIAGNOSIS — S88112D Complete traumatic amputation at level between knee and ankle, left lower leg, subsequent encounter: Secondary | ICD-10-CM | POA: Diagnosis not present

## 2022-08-27 DIAGNOSIS — D472 Monoclonal gammopathy: Secondary | ICD-10-CM | POA: Diagnosis not present

## 2022-08-27 DIAGNOSIS — C4492 Squamous cell carcinoma of skin, unspecified: Secondary | ICD-10-CM | POA: Diagnosis not present

## 2022-08-27 DIAGNOSIS — E1151 Type 2 diabetes mellitus with diabetic peripheral angiopathy without gangrene: Secondary | ICD-10-CM | POA: Diagnosis not present

## 2022-08-27 DIAGNOSIS — I4819 Other persistent atrial fibrillation: Secondary | ICD-10-CM | POA: Diagnosis not present

## 2022-08-27 DIAGNOSIS — Z89512 Acquired absence of left leg below knee: Secondary | ICD-10-CM | POA: Diagnosis not present

## 2022-08-27 DIAGNOSIS — D469 Myelodysplastic syndrome, unspecified: Secondary | ICD-10-CM | POA: Diagnosis not present

## 2022-08-27 DIAGNOSIS — E1142 Type 2 diabetes mellitus with diabetic polyneuropathy: Secondary | ICD-10-CM | POA: Diagnosis not present

## 2022-08-28 DIAGNOSIS — I4819 Other persistent atrial fibrillation: Secondary | ICD-10-CM | POA: Diagnosis not present

## 2022-08-28 DIAGNOSIS — D472 Monoclonal gammopathy: Secondary | ICD-10-CM | POA: Diagnosis not present

## 2022-08-28 DIAGNOSIS — D469 Myelodysplastic syndrome, unspecified: Secondary | ICD-10-CM | POA: Diagnosis not present

## 2022-08-28 DIAGNOSIS — C7989 Secondary malignant neoplasm of other specified sites: Secondary | ICD-10-CM | POA: Diagnosis not present

## 2022-08-28 DIAGNOSIS — E1142 Type 2 diabetes mellitus with diabetic polyneuropathy: Secondary | ICD-10-CM | POA: Diagnosis not present

## 2022-08-28 DIAGNOSIS — E1151 Type 2 diabetes mellitus with diabetic peripheral angiopathy without gangrene: Secondary | ICD-10-CM | POA: Diagnosis not present

## 2022-08-28 DIAGNOSIS — D630 Anemia in neoplastic disease: Secondary | ICD-10-CM | POA: Diagnosis not present

## 2022-08-28 DIAGNOSIS — C4492 Squamous cell carcinoma of skin, unspecified: Secondary | ICD-10-CM | POA: Diagnosis not present

## 2022-08-29 DIAGNOSIS — E1142 Type 2 diabetes mellitus with diabetic polyneuropathy: Secondary | ICD-10-CM | POA: Diagnosis not present

## 2022-08-29 DIAGNOSIS — I4819 Other persistent atrial fibrillation: Secondary | ICD-10-CM | POA: Diagnosis not present

## 2022-08-29 DIAGNOSIS — D472 Monoclonal gammopathy: Secondary | ICD-10-CM | POA: Diagnosis not present

## 2022-08-29 DIAGNOSIS — D469 Myelodysplastic syndrome, unspecified: Secondary | ICD-10-CM | POA: Diagnosis not present

## 2022-08-29 DIAGNOSIS — E1151 Type 2 diabetes mellitus with diabetic peripheral angiopathy without gangrene: Secondary | ICD-10-CM | POA: Diagnosis not present

## 2022-08-29 DIAGNOSIS — C4492 Squamous cell carcinoma of skin, unspecified: Secondary | ICD-10-CM | POA: Diagnosis not present

## 2022-08-30 DIAGNOSIS — Z510 Encounter for antineoplastic radiation therapy: Secondary | ICD-10-CM | POA: Diagnosis not present

## 2022-08-30 DIAGNOSIS — D469 Myelodysplastic syndrome, unspecified: Secondary | ICD-10-CM | POA: Diagnosis not present

## 2022-08-30 DIAGNOSIS — E119 Type 2 diabetes mellitus without complications: Secondary | ICD-10-CM | POA: Diagnosis not present

## 2022-08-30 DIAGNOSIS — C07 Malignant neoplasm of parotid gland: Secondary | ICD-10-CM | POA: Diagnosis not present

## 2022-08-30 DIAGNOSIS — C44320 Squamous cell carcinoma of skin of unspecified parts of face: Secondary | ICD-10-CM | POA: Diagnosis not present

## 2022-08-30 DIAGNOSIS — C4492 Squamous cell carcinoma of skin, unspecified: Secondary | ICD-10-CM | POA: Diagnosis not present

## 2022-08-30 DIAGNOSIS — I4891 Unspecified atrial fibrillation: Secondary | ICD-10-CM | POA: Diagnosis not present

## 2022-09-02 DIAGNOSIS — C4492 Squamous cell carcinoma of skin, unspecified: Secondary | ICD-10-CM | POA: Diagnosis not present

## 2022-09-02 DIAGNOSIS — I4819 Other persistent atrial fibrillation: Secondary | ICD-10-CM | POA: Diagnosis not present

## 2022-09-02 DIAGNOSIS — E1142 Type 2 diabetes mellitus with diabetic polyneuropathy: Secondary | ICD-10-CM | POA: Diagnosis not present

## 2022-09-02 DIAGNOSIS — D472 Monoclonal gammopathy: Secondary | ICD-10-CM | POA: Diagnosis not present

## 2022-09-02 DIAGNOSIS — E1151 Type 2 diabetes mellitus with diabetic peripheral angiopathy without gangrene: Secondary | ICD-10-CM | POA: Diagnosis not present

## 2022-09-02 DIAGNOSIS — D469 Myelodysplastic syndrome, unspecified: Secondary | ICD-10-CM | POA: Diagnosis not present

## 2022-09-03 DIAGNOSIS — C4492 Squamous cell carcinoma of skin, unspecified: Secondary | ICD-10-CM | POA: Diagnosis not present

## 2022-09-03 DIAGNOSIS — I4891 Unspecified atrial fibrillation: Secondary | ICD-10-CM | POA: Diagnosis not present

## 2022-09-03 DIAGNOSIS — E119 Type 2 diabetes mellitus without complications: Secondary | ICD-10-CM | POA: Diagnosis not present

## 2022-09-03 DIAGNOSIS — C44320 Squamous cell carcinoma of skin of unspecified parts of face: Secondary | ICD-10-CM | POA: Diagnosis not present

## 2022-09-03 DIAGNOSIS — D469 Myelodysplastic syndrome, unspecified: Secondary | ICD-10-CM | POA: Diagnosis not present

## 2022-09-03 DIAGNOSIS — Z510 Encounter for antineoplastic radiation therapy: Secondary | ICD-10-CM | POA: Diagnosis not present

## 2022-09-03 DIAGNOSIS — C07 Malignant neoplasm of parotid gland: Secondary | ICD-10-CM | POA: Diagnosis not present

## 2022-09-04 DIAGNOSIS — C7989 Secondary malignant neoplasm of other specified sites: Secondary | ICD-10-CM | POA: Diagnosis not present

## 2022-09-04 DIAGNOSIS — E1151 Type 2 diabetes mellitus with diabetic peripheral angiopathy without gangrene: Secondary | ICD-10-CM | POA: Diagnosis not present

## 2022-09-04 DIAGNOSIS — D472 Monoclonal gammopathy: Secondary | ICD-10-CM | POA: Diagnosis not present

## 2022-09-04 DIAGNOSIS — E1142 Type 2 diabetes mellitus with diabetic polyneuropathy: Secondary | ICD-10-CM | POA: Diagnosis not present

## 2022-09-04 DIAGNOSIS — D469 Myelodysplastic syndrome, unspecified: Secondary | ICD-10-CM | POA: Diagnosis not present

## 2022-09-04 DIAGNOSIS — Z87891 Personal history of nicotine dependence: Secondary | ICD-10-CM | POA: Diagnosis not present

## 2022-09-04 DIAGNOSIS — Z89511 Acquired absence of right leg below knee: Secondary | ICD-10-CM | POA: Diagnosis not present

## 2022-09-04 DIAGNOSIS — C4492 Squamous cell carcinoma of skin, unspecified: Secondary | ICD-10-CM | POA: Diagnosis not present

## 2022-09-04 DIAGNOSIS — Z7982 Long term (current) use of aspirin: Secondary | ICD-10-CM | POA: Diagnosis not present

## 2022-09-04 DIAGNOSIS — M179 Osteoarthritis of knee, unspecified: Secondary | ICD-10-CM | POA: Diagnosis not present

## 2022-09-04 DIAGNOSIS — Z8546 Personal history of malignant neoplasm of prostate: Secondary | ICD-10-CM | POA: Diagnosis not present

## 2022-09-04 DIAGNOSIS — I4819 Other persistent atrial fibrillation: Secondary | ICD-10-CM | POA: Diagnosis not present

## 2022-09-04 DIAGNOSIS — D630 Anemia in neoplastic disease: Secondary | ICD-10-CM | POA: Diagnosis not present

## 2022-09-04 DIAGNOSIS — M5020 Other cervical disc displacement, unspecified cervical region: Secondary | ICD-10-CM | POA: Diagnosis not present

## 2022-09-04 DIAGNOSIS — Z89512 Acquired absence of left leg below knee: Secondary | ICD-10-CM | POA: Diagnosis not present

## 2022-09-04 DIAGNOSIS — Z7901 Long term (current) use of anticoagulants: Secondary | ICD-10-CM | POA: Diagnosis not present

## 2022-09-09 DIAGNOSIS — D469 Myelodysplastic syndrome, unspecified: Secondary | ICD-10-CM | POA: Diagnosis not present

## 2022-09-09 DIAGNOSIS — C44320 Squamous cell carcinoma of skin of unspecified parts of face: Secondary | ICD-10-CM | POA: Diagnosis not present

## 2022-09-09 DIAGNOSIS — C4492 Squamous cell carcinoma of skin, unspecified: Secondary | ICD-10-CM | POA: Diagnosis not present

## 2022-09-09 DIAGNOSIS — C07 Malignant neoplasm of parotid gland: Secondary | ICD-10-CM | POA: Diagnosis not present

## 2022-09-09 DIAGNOSIS — E119 Type 2 diabetes mellitus without complications: Secondary | ICD-10-CM | POA: Diagnosis not present

## 2022-09-09 DIAGNOSIS — I4891 Unspecified atrial fibrillation: Secondary | ICD-10-CM | POA: Diagnosis not present

## 2022-09-09 DIAGNOSIS — Z510 Encounter for antineoplastic radiation therapy: Secondary | ICD-10-CM | POA: Diagnosis not present

## 2022-09-10 DIAGNOSIS — D469 Myelodysplastic syndrome, unspecified: Secondary | ICD-10-CM | POA: Diagnosis not present

## 2022-09-10 DIAGNOSIS — C4492 Squamous cell carcinoma of skin, unspecified: Secondary | ICD-10-CM | POA: Diagnosis not present

## 2022-09-10 DIAGNOSIS — C07 Malignant neoplasm of parotid gland: Secondary | ICD-10-CM | POA: Diagnosis not present

## 2022-09-10 DIAGNOSIS — I4891 Unspecified atrial fibrillation: Secondary | ICD-10-CM | POA: Diagnosis not present

## 2022-09-10 DIAGNOSIS — E119 Type 2 diabetes mellitus without complications: Secondary | ICD-10-CM | POA: Diagnosis not present

## 2022-09-10 DIAGNOSIS — Z510 Encounter for antineoplastic radiation therapy: Secondary | ICD-10-CM | POA: Diagnosis not present

## 2022-09-10 DIAGNOSIS — C44320 Squamous cell carcinoma of skin of unspecified parts of face: Secondary | ICD-10-CM | POA: Diagnosis not present

## 2022-09-11 DIAGNOSIS — D469 Myelodysplastic syndrome, unspecified: Secondary | ICD-10-CM | POA: Diagnosis not present

## 2022-09-11 DIAGNOSIS — D472 Monoclonal gammopathy: Secondary | ICD-10-CM | POA: Diagnosis not present

## 2022-09-11 DIAGNOSIS — E1142 Type 2 diabetes mellitus with diabetic polyneuropathy: Secondary | ICD-10-CM | POA: Diagnosis not present

## 2022-09-11 DIAGNOSIS — C4492 Squamous cell carcinoma of skin, unspecified: Secondary | ICD-10-CM | POA: Diagnosis not present

## 2022-09-11 DIAGNOSIS — C44320 Squamous cell carcinoma of skin of unspecified parts of face: Secondary | ICD-10-CM | POA: Diagnosis not present

## 2022-09-11 DIAGNOSIS — Z510 Encounter for antineoplastic radiation therapy: Secondary | ICD-10-CM | POA: Diagnosis not present

## 2022-09-11 DIAGNOSIS — C07 Malignant neoplasm of parotid gland: Secondary | ICD-10-CM | POA: Diagnosis not present

## 2022-09-11 DIAGNOSIS — I4891 Unspecified atrial fibrillation: Secondary | ICD-10-CM | POA: Diagnosis not present

## 2022-09-11 DIAGNOSIS — E1151 Type 2 diabetes mellitus with diabetic peripheral angiopathy without gangrene: Secondary | ICD-10-CM | POA: Diagnosis not present

## 2022-09-11 DIAGNOSIS — I4819 Other persistent atrial fibrillation: Secondary | ICD-10-CM | POA: Diagnosis not present

## 2022-09-11 DIAGNOSIS — E119 Type 2 diabetes mellitus without complications: Secondary | ICD-10-CM | POA: Diagnosis not present

## 2022-09-12 DIAGNOSIS — C4492 Squamous cell carcinoma of skin, unspecified: Secondary | ICD-10-CM | POA: Diagnosis not present

## 2022-09-12 DIAGNOSIS — I4819 Other persistent atrial fibrillation: Secondary | ICD-10-CM | POA: Diagnosis not present

## 2022-09-12 DIAGNOSIS — E119 Type 2 diabetes mellitus without complications: Secondary | ICD-10-CM | POA: Diagnosis not present

## 2022-09-12 DIAGNOSIS — Z510 Encounter for antineoplastic radiation therapy: Secondary | ICD-10-CM | POA: Diagnosis not present

## 2022-09-12 DIAGNOSIS — I4891 Unspecified atrial fibrillation: Secondary | ICD-10-CM | POA: Diagnosis not present

## 2022-09-12 DIAGNOSIS — C44320 Squamous cell carcinoma of skin of unspecified parts of face: Secondary | ICD-10-CM | POA: Diagnosis not present

## 2022-09-12 DIAGNOSIS — D469 Myelodysplastic syndrome, unspecified: Secondary | ICD-10-CM | POA: Diagnosis not present

## 2022-09-12 DIAGNOSIS — E1142 Type 2 diabetes mellitus with diabetic polyneuropathy: Secondary | ICD-10-CM | POA: Diagnosis not present

## 2022-09-12 DIAGNOSIS — E1151 Type 2 diabetes mellitus with diabetic peripheral angiopathy without gangrene: Secondary | ICD-10-CM | POA: Diagnosis not present

## 2022-09-12 DIAGNOSIS — D472 Monoclonal gammopathy: Secondary | ICD-10-CM | POA: Diagnosis not present

## 2022-09-12 DIAGNOSIS — C07 Malignant neoplasm of parotid gland: Secondary | ICD-10-CM | POA: Diagnosis not present

## 2022-09-13 DIAGNOSIS — I4891 Unspecified atrial fibrillation: Secondary | ICD-10-CM | POA: Diagnosis not present

## 2022-09-13 DIAGNOSIS — C4492 Squamous cell carcinoma of skin, unspecified: Secondary | ICD-10-CM | POA: Diagnosis not present

## 2022-09-13 DIAGNOSIS — C07 Malignant neoplasm of parotid gland: Secondary | ICD-10-CM | POA: Diagnosis not present

## 2022-09-13 DIAGNOSIS — Z510 Encounter for antineoplastic radiation therapy: Secondary | ICD-10-CM | POA: Diagnosis not present

## 2022-09-13 DIAGNOSIS — D469 Myelodysplastic syndrome, unspecified: Secondary | ICD-10-CM | POA: Diagnosis not present

## 2022-09-13 DIAGNOSIS — E119 Type 2 diabetes mellitus without complications: Secondary | ICD-10-CM | POA: Diagnosis not present

## 2022-09-13 DIAGNOSIS — C44320 Squamous cell carcinoma of skin of unspecified parts of face: Secondary | ICD-10-CM | POA: Diagnosis not present

## 2022-09-15 DIAGNOSIS — D649 Anemia, unspecified: Secondary | ICD-10-CM | POA: Diagnosis not present

## 2022-09-15 DIAGNOSIS — E7800 Pure hypercholesterolemia, unspecified: Secondary | ICD-10-CM | POA: Diagnosis not present

## 2022-09-15 DIAGNOSIS — E1169 Type 2 diabetes mellitus with other specified complication: Secondary | ICD-10-CM | POA: Diagnosis not present

## 2022-09-16 DIAGNOSIS — C07 Malignant neoplasm of parotid gland: Secondary | ICD-10-CM | POA: Diagnosis not present

## 2022-09-16 DIAGNOSIS — L97929 Non-pressure chronic ulcer of unspecified part of left lower leg with unspecified severity: Secondary | ICD-10-CM | POA: Diagnosis not present

## 2022-09-16 DIAGNOSIS — I779 Disorder of arteries and arterioles, unspecified: Secondary | ICD-10-CM | POA: Diagnosis not present

## 2022-09-16 DIAGNOSIS — T8789 Other complications of amputation stump: Secondary | ICD-10-CM | POA: Diagnosis not present

## 2022-09-16 DIAGNOSIS — Z89512 Acquired absence of left leg below knee: Secondary | ICD-10-CM | POA: Diagnosis not present

## 2022-09-16 DIAGNOSIS — Z8673 Personal history of transient ischemic attack (TIA), and cerebral infarction without residual deficits: Secondary | ICD-10-CM | POA: Diagnosis not present

## 2022-09-16 DIAGNOSIS — I4891 Unspecified atrial fibrillation: Secondary | ICD-10-CM | POA: Diagnosis not present

## 2022-09-16 DIAGNOSIS — I70223 Atherosclerosis of native arteries of extremities with rest pain, bilateral legs: Secondary | ICD-10-CM | POA: Diagnosis not present

## 2022-09-16 DIAGNOSIS — M818 Other osteoporosis without current pathological fracture: Secondary | ICD-10-CM | POA: Diagnosis not present

## 2022-09-16 DIAGNOSIS — Z85828 Personal history of other malignant neoplasm of skin: Secondary | ICD-10-CM | POA: Diagnosis not present

## 2022-09-16 DIAGNOSIS — K118 Other diseases of salivary glands: Secondary | ICD-10-CM | POA: Diagnosis not present

## 2022-09-16 DIAGNOSIS — Z86718 Personal history of other venous thrombosis and embolism: Secondary | ICD-10-CM | POA: Diagnosis not present

## 2022-09-16 DIAGNOSIS — C61 Malignant neoplasm of prostate: Secondary | ICD-10-CM | POA: Diagnosis not present

## 2022-09-16 DIAGNOSIS — E1151 Type 2 diabetes mellitus with diabetic peripheral angiopathy without gangrene: Secondary | ICD-10-CM | POA: Diagnosis not present

## 2022-09-16 DIAGNOSIS — Z510 Encounter for antineoplastic radiation therapy: Secondary | ICD-10-CM | POA: Diagnosis not present

## 2022-09-16 DIAGNOSIS — T380X5A Adverse effect of glucocorticoids and synthetic analogues, initial encounter: Secondary | ICD-10-CM | POA: Diagnosis not present

## 2022-09-16 DIAGNOSIS — C44320 Squamous cell carcinoma of skin of unspecified parts of face: Secondary | ICD-10-CM | POA: Diagnosis not present

## 2022-09-16 DIAGNOSIS — D638 Anemia in other chronic diseases classified elsewhere: Secondary | ICD-10-CM | POA: Diagnosis not present

## 2022-09-16 DIAGNOSIS — D469 Myelodysplastic syndrome, unspecified: Secondary | ICD-10-CM | POA: Diagnosis not present

## 2022-09-16 DIAGNOSIS — E119 Type 2 diabetes mellitus without complications: Secondary | ICD-10-CM | POA: Diagnosis not present

## 2022-09-16 DIAGNOSIS — Z89511 Acquired absence of right leg below knee: Secondary | ICD-10-CM | POA: Diagnosis not present

## 2022-09-17 DIAGNOSIS — C07 Malignant neoplasm of parotid gland: Secondary | ICD-10-CM | POA: Diagnosis not present

## 2022-09-17 DIAGNOSIS — T8789 Other complications of amputation stump: Secondary | ICD-10-CM | POA: Diagnosis not present

## 2022-09-17 DIAGNOSIS — D472 Monoclonal gammopathy: Secondary | ICD-10-CM | POA: Diagnosis not present

## 2022-09-17 DIAGNOSIS — E1151 Type 2 diabetes mellitus with diabetic peripheral angiopathy without gangrene: Secondary | ICD-10-CM | POA: Diagnosis not present

## 2022-09-17 DIAGNOSIS — L97821 Non-pressure chronic ulcer of other part of left lower leg limited to breakdown of skin: Secondary | ICD-10-CM | POA: Diagnosis not present

## 2022-09-17 DIAGNOSIS — L97929 Non-pressure chronic ulcer of unspecified part of left lower leg with unspecified severity: Secondary | ICD-10-CM | POA: Diagnosis not present

## 2022-09-17 DIAGNOSIS — C4492 Squamous cell carcinoma of skin, unspecified: Secondary | ICD-10-CM | POA: Diagnosis not present

## 2022-09-17 DIAGNOSIS — D469 Myelodysplastic syndrome, unspecified: Secondary | ICD-10-CM | POA: Diagnosis not present

## 2022-09-17 DIAGNOSIS — I4819 Other persistent atrial fibrillation: Secondary | ICD-10-CM | POA: Diagnosis not present

## 2022-09-17 DIAGNOSIS — Z510 Encounter for antineoplastic radiation therapy: Secondary | ICD-10-CM | POA: Diagnosis not present

## 2022-09-17 DIAGNOSIS — E11622 Type 2 diabetes mellitus with other skin ulcer: Secondary | ICD-10-CM | POA: Diagnosis not present

## 2022-09-17 DIAGNOSIS — E1142 Type 2 diabetes mellitus with diabetic polyneuropathy: Secondary | ICD-10-CM | POA: Diagnosis not present

## 2022-09-17 DIAGNOSIS — Z89512 Acquired absence of left leg below knee: Secondary | ICD-10-CM | POA: Diagnosis not present

## 2022-09-18 DIAGNOSIS — I4819 Other persistent atrial fibrillation: Secondary | ICD-10-CM | POA: Diagnosis not present

## 2022-09-18 DIAGNOSIS — L97929 Non-pressure chronic ulcer of unspecified part of left lower leg with unspecified severity: Secondary | ICD-10-CM | POA: Diagnosis not present

## 2022-09-18 DIAGNOSIS — Z510 Encounter for antineoplastic radiation therapy: Secondary | ICD-10-CM | POA: Diagnosis not present

## 2022-09-18 DIAGNOSIS — T8789 Other complications of amputation stump: Secondary | ICD-10-CM | POA: Diagnosis not present

## 2022-09-18 DIAGNOSIS — Z89512 Acquired absence of left leg below knee: Secondary | ICD-10-CM | POA: Diagnosis not present

## 2022-09-18 DIAGNOSIS — E1151 Type 2 diabetes mellitus with diabetic peripheral angiopathy without gangrene: Secondary | ICD-10-CM | POA: Diagnosis not present

## 2022-09-18 DIAGNOSIS — C07 Malignant neoplasm of parotid gland: Secondary | ICD-10-CM | POA: Diagnosis not present

## 2022-09-18 DIAGNOSIS — D469 Myelodysplastic syndrome, unspecified: Secondary | ICD-10-CM | POA: Diagnosis not present

## 2022-09-18 DIAGNOSIS — E1142 Type 2 diabetes mellitus with diabetic polyneuropathy: Secondary | ICD-10-CM | POA: Diagnosis not present

## 2022-09-18 DIAGNOSIS — D472 Monoclonal gammopathy: Secondary | ICD-10-CM | POA: Diagnosis not present

## 2022-09-18 DIAGNOSIS — C4492 Squamous cell carcinoma of skin, unspecified: Secondary | ICD-10-CM | POA: Diagnosis not present

## 2022-09-20 DIAGNOSIS — L97929 Non-pressure chronic ulcer of unspecified part of left lower leg with unspecified severity: Secondary | ICD-10-CM | POA: Diagnosis not present

## 2022-09-20 DIAGNOSIS — Z89512 Acquired absence of left leg below knee: Secondary | ICD-10-CM | POA: Diagnosis not present

## 2022-09-20 DIAGNOSIS — T8789 Other complications of amputation stump: Secondary | ICD-10-CM | POA: Diagnosis not present

## 2022-09-20 DIAGNOSIS — E1151 Type 2 diabetes mellitus with diabetic peripheral angiopathy without gangrene: Secondary | ICD-10-CM | POA: Diagnosis not present

## 2022-09-20 DIAGNOSIS — Z510 Encounter for antineoplastic radiation therapy: Secondary | ICD-10-CM | POA: Diagnosis not present

## 2022-09-20 DIAGNOSIS — C07 Malignant neoplasm of parotid gland: Secondary | ICD-10-CM | POA: Diagnosis not present

## 2022-09-23 DIAGNOSIS — T8789 Other complications of amputation stump: Secondary | ICD-10-CM | POA: Diagnosis not present

## 2022-09-23 DIAGNOSIS — E1151 Type 2 diabetes mellitus with diabetic peripheral angiopathy without gangrene: Secondary | ICD-10-CM | POA: Diagnosis not present

## 2022-09-23 DIAGNOSIS — C07 Malignant neoplasm of parotid gland: Secondary | ICD-10-CM | POA: Diagnosis not present

## 2022-09-23 DIAGNOSIS — Z89512 Acquired absence of left leg below knee: Secondary | ICD-10-CM | POA: Diagnosis not present

## 2022-09-23 DIAGNOSIS — L97929 Non-pressure chronic ulcer of unspecified part of left lower leg with unspecified severity: Secondary | ICD-10-CM | POA: Diagnosis not present

## 2022-09-23 DIAGNOSIS — Z510 Encounter for antineoplastic radiation therapy: Secondary | ICD-10-CM | POA: Diagnosis not present

## 2022-09-24 DIAGNOSIS — Z89512 Acquired absence of left leg below knee: Secondary | ICD-10-CM | POA: Diagnosis not present

## 2022-09-24 DIAGNOSIS — D472 Monoclonal gammopathy: Secondary | ICD-10-CM | POA: Diagnosis not present

## 2022-09-24 DIAGNOSIS — C4492 Squamous cell carcinoma of skin, unspecified: Secondary | ICD-10-CM | POA: Diagnosis not present

## 2022-09-24 DIAGNOSIS — E1142 Type 2 diabetes mellitus with diabetic polyneuropathy: Secondary | ICD-10-CM | POA: Diagnosis not present

## 2022-09-24 DIAGNOSIS — I4819 Other persistent atrial fibrillation: Secondary | ICD-10-CM | POA: Diagnosis not present

## 2022-09-24 DIAGNOSIS — E1151 Type 2 diabetes mellitus with diabetic peripheral angiopathy without gangrene: Secondary | ICD-10-CM | POA: Diagnosis not present

## 2022-09-24 DIAGNOSIS — Z510 Encounter for antineoplastic radiation therapy: Secondary | ICD-10-CM | POA: Diagnosis not present

## 2022-09-24 DIAGNOSIS — C07 Malignant neoplasm of parotid gland: Secondary | ICD-10-CM | POA: Diagnosis not present

## 2022-09-24 DIAGNOSIS — L97929 Non-pressure chronic ulcer of unspecified part of left lower leg with unspecified severity: Secondary | ICD-10-CM | POA: Diagnosis not present

## 2022-09-24 DIAGNOSIS — T8789 Other complications of amputation stump: Secondary | ICD-10-CM | POA: Diagnosis not present

## 2022-09-24 DIAGNOSIS — D469 Myelodysplastic syndrome, unspecified: Secondary | ICD-10-CM | POA: Diagnosis not present

## 2022-09-25 DIAGNOSIS — E1151 Type 2 diabetes mellitus with diabetic peripheral angiopathy without gangrene: Secondary | ICD-10-CM | POA: Diagnosis not present

## 2022-09-25 DIAGNOSIS — C07 Malignant neoplasm of parotid gland: Secondary | ICD-10-CM | POA: Diagnosis not present

## 2022-09-25 DIAGNOSIS — Z89512 Acquired absence of left leg below knee: Secondary | ICD-10-CM | POA: Diagnosis not present

## 2022-09-25 DIAGNOSIS — L97929 Non-pressure chronic ulcer of unspecified part of left lower leg with unspecified severity: Secondary | ICD-10-CM | POA: Diagnosis not present

## 2022-09-25 DIAGNOSIS — Z510 Encounter for antineoplastic radiation therapy: Secondary | ICD-10-CM | POA: Diagnosis not present

## 2022-09-25 DIAGNOSIS — T8789 Other complications of amputation stump: Secondary | ICD-10-CM | POA: Diagnosis not present

## 2022-09-26 DIAGNOSIS — E1151 Type 2 diabetes mellitus with diabetic peripheral angiopathy without gangrene: Secondary | ICD-10-CM | POA: Diagnosis not present

## 2022-09-26 DIAGNOSIS — T8789 Other complications of amputation stump: Secondary | ICD-10-CM | POA: Diagnosis not present

## 2022-09-26 DIAGNOSIS — Z89512 Acquired absence of left leg below knee: Secondary | ICD-10-CM | POA: Diagnosis not present

## 2022-09-26 DIAGNOSIS — D469 Myelodysplastic syndrome, unspecified: Secondary | ICD-10-CM | POA: Diagnosis not present

## 2022-09-26 DIAGNOSIS — Z510 Encounter for antineoplastic radiation therapy: Secondary | ICD-10-CM | POA: Diagnosis not present

## 2022-09-26 DIAGNOSIS — I4819 Other persistent atrial fibrillation: Secondary | ICD-10-CM | POA: Diagnosis not present

## 2022-09-26 DIAGNOSIS — E1142 Type 2 diabetes mellitus with diabetic polyneuropathy: Secondary | ICD-10-CM | POA: Diagnosis not present

## 2022-09-26 DIAGNOSIS — L97929 Non-pressure chronic ulcer of unspecified part of left lower leg with unspecified severity: Secondary | ICD-10-CM | POA: Diagnosis not present

## 2022-09-26 DIAGNOSIS — C07 Malignant neoplasm of parotid gland: Secondary | ICD-10-CM | POA: Diagnosis not present

## 2022-09-26 DIAGNOSIS — D472 Monoclonal gammopathy: Secondary | ICD-10-CM | POA: Diagnosis not present

## 2022-09-26 DIAGNOSIS — C4492 Squamous cell carcinoma of skin, unspecified: Secondary | ICD-10-CM | POA: Diagnosis not present

## 2022-09-27 DIAGNOSIS — L97929 Non-pressure chronic ulcer of unspecified part of left lower leg with unspecified severity: Secondary | ICD-10-CM | POA: Diagnosis not present

## 2022-09-27 DIAGNOSIS — C07 Malignant neoplasm of parotid gland: Secondary | ICD-10-CM | POA: Diagnosis not present

## 2022-09-27 DIAGNOSIS — T8789 Other complications of amputation stump: Secondary | ICD-10-CM | POA: Diagnosis not present

## 2022-09-27 DIAGNOSIS — Z510 Encounter for antineoplastic radiation therapy: Secondary | ICD-10-CM | POA: Diagnosis not present

## 2022-09-27 DIAGNOSIS — E1151 Type 2 diabetes mellitus with diabetic peripheral angiopathy without gangrene: Secondary | ICD-10-CM | POA: Diagnosis not present

## 2022-09-27 DIAGNOSIS — Z89512 Acquired absence of left leg below knee: Secondary | ICD-10-CM | POA: Diagnosis not present

## 2022-09-30 DIAGNOSIS — I4819 Other persistent atrial fibrillation: Secondary | ICD-10-CM | POA: Diagnosis not present

## 2022-09-30 DIAGNOSIS — Z510 Encounter for antineoplastic radiation therapy: Secondary | ICD-10-CM | POA: Diagnosis not present

## 2022-09-30 DIAGNOSIS — L97929 Non-pressure chronic ulcer of unspecified part of left lower leg with unspecified severity: Secondary | ICD-10-CM | POA: Diagnosis not present

## 2022-09-30 DIAGNOSIS — C4492 Squamous cell carcinoma of skin, unspecified: Secondary | ICD-10-CM | POA: Diagnosis not present

## 2022-09-30 DIAGNOSIS — C07 Malignant neoplasm of parotid gland: Secondary | ICD-10-CM | POA: Diagnosis not present

## 2022-09-30 DIAGNOSIS — D472 Monoclonal gammopathy: Secondary | ICD-10-CM | POA: Diagnosis not present

## 2022-09-30 DIAGNOSIS — E1151 Type 2 diabetes mellitus with diabetic peripheral angiopathy without gangrene: Secondary | ICD-10-CM | POA: Diagnosis not present

## 2022-09-30 DIAGNOSIS — Z89512 Acquired absence of left leg below knee: Secondary | ICD-10-CM | POA: Diagnosis not present

## 2022-09-30 DIAGNOSIS — T8789 Other complications of amputation stump: Secondary | ICD-10-CM | POA: Diagnosis not present

## 2022-09-30 DIAGNOSIS — E1142 Type 2 diabetes mellitus with diabetic polyneuropathy: Secondary | ICD-10-CM | POA: Diagnosis not present

## 2022-09-30 DIAGNOSIS — D469 Myelodysplastic syndrome, unspecified: Secondary | ICD-10-CM | POA: Diagnosis not present

## 2022-10-01 DIAGNOSIS — L97929 Non-pressure chronic ulcer of unspecified part of left lower leg with unspecified severity: Secondary | ICD-10-CM | POA: Diagnosis not present

## 2022-10-01 DIAGNOSIS — E1151 Type 2 diabetes mellitus with diabetic peripheral angiopathy without gangrene: Secondary | ICD-10-CM | POA: Diagnosis not present

## 2022-10-01 DIAGNOSIS — C07 Malignant neoplasm of parotid gland: Secondary | ICD-10-CM | POA: Diagnosis not present

## 2022-10-01 DIAGNOSIS — Z89512 Acquired absence of left leg below knee: Secondary | ICD-10-CM | POA: Diagnosis not present

## 2022-10-01 DIAGNOSIS — T8789 Other complications of amputation stump: Secondary | ICD-10-CM | POA: Diagnosis not present

## 2022-10-01 DIAGNOSIS — Z510 Encounter for antineoplastic radiation therapy: Secondary | ICD-10-CM | POA: Diagnosis not present

## 2022-10-02 DIAGNOSIS — C07 Malignant neoplasm of parotid gland: Secondary | ICD-10-CM | POA: Diagnosis not present

## 2022-10-02 DIAGNOSIS — C4492 Squamous cell carcinoma of skin, unspecified: Secondary | ICD-10-CM | POA: Diagnosis not present

## 2022-10-02 DIAGNOSIS — Z89512 Acquired absence of left leg below knee: Secondary | ICD-10-CM | POA: Diagnosis not present

## 2022-10-02 DIAGNOSIS — D472 Monoclonal gammopathy: Secondary | ICD-10-CM | POA: Diagnosis not present

## 2022-10-02 DIAGNOSIS — I4819 Other persistent atrial fibrillation: Secondary | ICD-10-CM | POA: Diagnosis not present

## 2022-10-02 DIAGNOSIS — E1142 Type 2 diabetes mellitus with diabetic polyneuropathy: Secondary | ICD-10-CM | POA: Diagnosis not present

## 2022-10-02 DIAGNOSIS — L97929 Non-pressure chronic ulcer of unspecified part of left lower leg with unspecified severity: Secondary | ICD-10-CM | POA: Diagnosis not present

## 2022-10-02 DIAGNOSIS — Z510 Encounter for antineoplastic radiation therapy: Secondary | ICD-10-CM | POA: Diagnosis not present

## 2022-10-02 DIAGNOSIS — D469 Myelodysplastic syndrome, unspecified: Secondary | ICD-10-CM | POA: Diagnosis not present

## 2022-10-02 DIAGNOSIS — E1151 Type 2 diabetes mellitus with diabetic peripheral angiopathy without gangrene: Secondary | ICD-10-CM | POA: Diagnosis not present

## 2022-10-02 DIAGNOSIS — T8789 Other complications of amputation stump: Secondary | ICD-10-CM | POA: Diagnosis not present

## 2022-10-03 DIAGNOSIS — T8789 Other complications of amputation stump: Secondary | ICD-10-CM | POA: Diagnosis not present

## 2022-10-03 DIAGNOSIS — L97929 Non-pressure chronic ulcer of unspecified part of left lower leg with unspecified severity: Secondary | ICD-10-CM | POA: Diagnosis not present

## 2022-10-03 DIAGNOSIS — Z510 Encounter for antineoplastic radiation therapy: Secondary | ICD-10-CM | POA: Diagnosis not present

## 2022-10-03 DIAGNOSIS — C07 Malignant neoplasm of parotid gland: Secondary | ICD-10-CM | POA: Diagnosis not present

## 2022-10-03 DIAGNOSIS — E1151 Type 2 diabetes mellitus with diabetic peripheral angiopathy without gangrene: Secondary | ICD-10-CM | POA: Diagnosis not present

## 2022-10-03 DIAGNOSIS — Z89512 Acquired absence of left leg below knee: Secondary | ICD-10-CM | POA: Diagnosis not present

## 2022-10-04 DIAGNOSIS — E1142 Type 2 diabetes mellitus with diabetic polyneuropathy: Secondary | ICD-10-CM | POA: Diagnosis not present

## 2022-10-04 DIAGNOSIS — Z89512 Acquired absence of left leg below knee: Secondary | ICD-10-CM | POA: Diagnosis not present

## 2022-10-04 DIAGNOSIS — Z7984 Long term (current) use of oral hypoglycemic drugs: Secondary | ICD-10-CM | POA: Diagnosis not present

## 2022-10-04 DIAGNOSIS — M179 Osteoarthritis of knee, unspecified: Secondary | ICD-10-CM | POA: Diagnosis not present

## 2022-10-04 DIAGNOSIS — Z89511 Acquired absence of right leg below knee: Secondary | ICD-10-CM | POA: Diagnosis not present

## 2022-10-04 DIAGNOSIS — Z7952 Long term (current) use of systemic steroids: Secondary | ICD-10-CM | POA: Diagnosis not present

## 2022-10-04 DIAGNOSIS — C7989 Secondary malignant neoplasm of other specified sites: Secondary | ICD-10-CM | POA: Diagnosis not present

## 2022-10-04 DIAGNOSIS — E1151 Type 2 diabetes mellitus with diabetic peripheral angiopathy without gangrene: Secondary | ICD-10-CM | POA: Diagnosis not present

## 2022-10-04 DIAGNOSIS — M5020 Other cervical disc displacement, unspecified cervical region: Secondary | ICD-10-CM | POA: Diagnosis not present

## 2022-10-04 DIAGNOSIS — D469 Myelodysplastic syndrome, unspecified: Secondary | ICD-10-CM | POA: Diagnosis not present

## 2022-10-04 DIAGNOSIS — Z7901 Long term (current) use of anticoagulants: Secondary | ICD-10-CM | POA: Diagnosis not present

## 2022-10-04 DIAGNOSIS — Z993 Dependence on wheelchair: Secondary | ICD-10-CM | POA: Diagnosis not present

## 2022-10-04 DIAGNOSIS — C4492 Squamous cell carcinoma of skin, unspecified: Secondary | ICD-10-CM | POA: Diagnosis not present

## 2022-10-04 DIAGNOSIS — Z87891 Personal history of nicotine dependence: Secondary | ICD-10-CM | POA: Diagnosis not present

## 2022-10-04 DIAGNOSIS — Z7982 Long term (current) use of aspirin: Secondary | ICD-10-CM | POA: Diagnosis not present

## 2022-10-04 DIAGNOSIS — Z510 Encounter for antineoplastic radiation therapy: Secondary | ICD-10-CM | POA: Diagnosis not present

## 2022-10-04 DIAGNOSIS — C07 Malignant neoplasm of parotid gland: Secondary | ICD-10-CM | POA: Diagnosis not present

## 2022-10-04 DIAGNOSIS — Z8546 Personal history of malignant neoplasm of prostate: Secondary | ICD-10-CM | POA: Diagnosis not present

## 2022-10-04 DIAGNOSIS — I4819 Other persistent atrial fibrillation: Secondary | ICD-10-CM | POA: Diagnosis not present

## 2022-10-04 DIAGNOSIS — D472 Monoclonal gammopathy: Secondary | ICD-10-CM | POA: Diagnosis not present

## 2022-10-04 DIAGNOSIS — L97929 Non-pressure chronic ulcer of unspecified part of left lower leg with unspecified severity: Secondary | ICD-10-CM | POA: Diagnosis not present

## 2022-10-04 DIAGNOSIS — D630 Anemia in neoplastic disease: Secondary | ICD-10-CM | POA: Diagnosis not present

## 2022-10-04 DIAGNOSIS — T8789 Other complications of amputation stump: Secondary | ICD-10-CM | POA: Diagnosis not present

## 2022-10-07 DIAGNOSIS — C44320 Squamous cell carcinoma of skin of unspecified parts of face: Secondary | ICD-10-CM | POA: Diagnosis not present

## 2022-10-07 DIAGNOSIS — Z89512 Acquired absence of left leg below knee: Secondary | ICD-10-CM | POA: Diagnosis not present

## 2022-10-07 DIAGNOSIS — T8789 Other complications of amputation stump: Secondary | ICD-10-CM | POA: Diagnosis not present

## 2022-10-07 DIAGNOSIS — L97929 Non-pressure chronic ulcer of unspecified part of left lower leg with unspecified severity: Secondary | ICD-10-CM | POA: Diagnosis not present

## 2022-10-07 DIAGNOSIS — C07 Malignant neoplasm of parotid gland: Secondary | ICD-10-CM | POA: Diagnosis not present

## 2022-10-07 DIAGNOSIS — D469 Myelodysplastic syndrome, unspecified: Secondary | ICD-10-CM | POA: Diagnosis not present

## 2022-10-07 DIAGNOSIS — D638 Anemia in other chronic diseases classified elsewhere: Secondary | ICD-10-CM | POA: Diagnosis not present

## 2022-10-07 DIAGNOSIS — E1151 Type 2 diabetes mellitus with diabetic peripheral angiopathy without gangrene: Secondary | ICD-10-CM | POA: Diagnosis not present

## 2022-10-07 DIAGNOSIS — Z510 Encounter for antineoplastic radiation therapy: Secondary | ICD-10-CM | POA: Diagnosis not present

## 2022-10-08 DIAGNOSIS — I4819 Other persistent atrial fibrillation: Secondary | ICD-10-CM | POA: Diagnosis not present

## 2022-10-08 DIAGNOSIS — L97929 Non-pressure chronic ulcer of unspecified part of left lower leg with unspecified severity: Secondary | ICD-10-CM | POA: Diagnosis not present

## 2022-10-08 DIAGNOSIS — E1142 Type 2 diabetes mellitus with diabetic polyneuropathy: Secondary | ICD-10-CM | POA: Diagnosis not present

## 2022-10-08 DIAGNOSIS — Z89511 Acquired absence of right leg below knee: Secondary | ICD-10-CM | POA: Diagnosis not present

## 2022-10-08 DIAGNOSIS — Z89512 Acquired absence of left leg below knee: Secondary | ICD-10-CM | POA: Diagnosis not present

## 2022-10-08 DIAGNOSIS — Z510 Encounter for antineoplastic radiation therapy: Secondary | ICD-10-CM | POA: Diagnosis not present

## 2022-10-08 DIAGNOSIS — E1151 Type 2 diabetes mellitus with diabetic peripheral angiopathy without gangrene: Secondary | ICD-10-CM | POA: Diagnosis not present

## 2022-10-08 DIAGNOSIS — C07 Malignant neoplasm of parotid gland: Secondary | ICD-10-CM | POA: Diagnosis not present

## 2022-10-08 DIAGNOSIS — D469 Myelodysplastic syndrome, unspecified: Secondary | ICD-10-CM | POA: Diagnosis not present

## 2022-10-08 DIAGNOSIS — T8789 Other complications of amputation stump: Secondary | ICD-10-CM | POA: Diagnosis not present

## 2022-10-09 DIAGNOSIS — C07 Malignant neoplasm of parotid gland: Secondary | ICD-10-CM | POA: Diagnosis not present

## 2022-10-09 DIAGNOSIS — T8789 Other complications of amputation stump: Secondary | ICD-10-CM | POA: Diagnosis not present

## 2022-10-09 DIAGNOSIS — Z510 Encounter for antineoplastic radiation therapy: Secondary | ICD-10-CM | POA: Diagnosis not present

## 2022-10-09 DIAGNOSIS — E1142 Type 2 diabetes mellitus with diabetic polyneuropathy: Secondary | ICD-10-CM | POA: Diagnosis not present

## 2022-10-09 DIAGNOSIS — D469 Myelodysplastic syndrome, unspecified: Secondary | ICD-10-CM | POA: Diagnosis not present

## 2022-10-09 DIAGNOSIS — Z89511 Acquired absence of right leg below knee: Secondary | ICD-10-CM | POA: Diagnosis not present

## 2022-10-09 DIAGNOSIS — Z89512 Acquired absence of left leg below knee: Secondary | ICD-10-CM | POA: Diagnosis not present

## 2022-10-09 DIAGNOSIS — L97929 Non-pressure chronic ulcer of unspecified part of left lower leg with unspecified severity: Secondary | ICD-10-CM | POA: Diagnosis not present

## 2022-10-09 DIAGNOSIS — E1151 Type 2 diabetes mellitus with diabetic peripheral angiopathy without gangrene: Secondary | ICD-10-CM | POA: Diagnosis not present

## 2022-10-09 DIAGNOSIS — I4819 Other persistent atrial fibrillation: Secondary | ICD-10-CM | POA: Diagnosis not present

## 2022-10-10 DIAGNOSIS — C07 Malignant neoplasm of parotid gland: Secondary | ICD-10-CM | POA: Diagnosis not present

## 2022-10-10 DIAGNOSIS — Z89511 Acquired absence of right leg below knee: Secondary | ICD-10-CM | POA: Diagnosis not present

## 2022-10-10 DIAGNOSIS — D469 Myelodysplastic syndrome, unspecified: Secondary | ICD-10-CM | POA: Diagnosis not present

## 2022-10-10 DIAGNOSIS — T8789 Other complications of amputation stump: Secondary | ICD-10-CM | POA: Diagnosis not present

## 2022-10-10 DIAGNOSIS — L97929 Non-pressure chronic ulcer of unspecified part of left lower leg with unspecified severity: Secondary | ICD-10-CM | POA: Diagnosis not present

## 2022-10-10 DIAGNOSIS — Z89512 Acquired absence of left leg below knee: Secondary | ICD-10-CM | POA: Diagnosis not present

## 2022-10-10 DIAGNOSIS — E1142 Type 2 diabetes mellitus with diabetic polyneuropathy: Secondary | ICD-10-CM | POA: Diagnosis not present

## 2022-10-10 DIAGNOSIS — I4819 Other persistent atrial fibrillation: Secondary | ICD-10-CM | POA: Diagnosis not present

## 2022-10-10 DIAGNOSIS — Z510 Encounter for antineoplastic radiation therapy: Secondary | ICD-10-CM | POA: Diagnosis not present

## 2022-10-10 DIAGNOSIS — E1151 Type 2 diabetes mellitus with diabetic peripheral angiopathy without gangrene: Secondary | ICD-10-CM | POA: Diagnosis not present

## 2022-10-11 DIAGNOSIS — Z89512 Acquired absence of left leg below knee: Secondary | ICD-10-CM | POA: Diagnosis not present

## 2022-10-11 DIAGNOSIS — Z510 Encounter for antineoplastic radiation therapy: Secondary | ICD-10-CM | POA: Diagnosis not present

## 2022-10-11 DIAGNOSIS — E1151 Type 2 diabetes mellitus with diabetic peripheral angiopathy without gangrene: Secondary | ICD-10-CM | POA: Diagnosis not present

## 2022-10-11 DIAGNOSIS — L97929 Non-pressure chronic ulcer of unspecified part of left lower leg with unspecified severity: Secondary | ICD-10-CM | POA: Diagnosis not present

## 2022-10-11 DIAGNOSIS — T8789 Other complications of amputation stump: Secondary | ICD-10-CM | POA: Diagnosis not present

## 2022-10-11 DIAGNOSIS — C07 Malignant neoplasm of parotid gland: Secondary | ICD-10-CM | POA: Diagnosis not present

## 2022-10-14 DIAGNOSIS — Z89512 Acquired absence of left leg below knee: Secondary | ICD-10-CM | POA: Diagnosis not present

## 2022-10-14 DIAGNOSIS — Z510 Encounter for antineoplastic radiation therapy: Secondary | ICD-10-CM | POA: Diagnosis not present

## 2022-10-14 DIAGNOSIS — T8789 Other complications of amputation stump: Secondary | ICD-10-CM | POA: Diagnosis not present

## 2022-10-14 DIAGNOSIS — C07 Malignant neoplasm of parotid gland: Secondary | ICD-10-CM | POA: Diagnosis not present

## 2022-10-14 DIAGNOSIS — E1151 Type 2 diabetes mellitus with diabetic peripheral angiopathy without gangrene: Secondary | ICD-10-CM | POA: Diagnosis not present

## 2022-10-14 DIAGNOSIS — L97929 Non-pressure chronic ulcer of unspecified part of left lower leg with unspecified severity: Secondary | ICD-10-CM | POA: Diagnosis not present

## 2022-10-15 DIAGNOSIS — T8789 Other complications of amputation stump: Secondary | ICD-10-CM | POA: Diagnosis not present

## 2022-10-15 DIAGNOSIS — L97929 Non-pressure chronic ulcer of unspecified part of left lower leg with unspecified severity: Secondary | ICD-10-CM | POA: Diagnosis not present

## 2022-10-15 DIAGNOSIS — L89890 Pressure ulcer of other site, unstageable: Secondary | ICD-10-CM | POA: Diagnosis not present

## 2022-10-15 DIAGNOSIS — Z89511 Acquired absence of right leg below knee: Secondary | ICD-10-CM | POA: Diagnosis not present

## 2022-10-15 DIAGNOSIS — Z89512 Acquired absence of left leg below knee: Secondary | ICD-10-CM | POA: Diagnosis not present

## 2022-10-15 DIAGNOSIS — Z510 Encounter for antineoplastic radiation therapy: Secondary | ICD-10-CM | POA: Diagnosis not present

## 2022-10-15 DIAGNOSIS — C07 Malignant neoplasm of parotid gland: Secondary | ICD-10-CM | POA: Diagnosis not present

## 2022-10-15 DIAGNOSIS — E1151 Type 2 diabetes mellitus with diabetic peripheral angiopathy without gangrene: Secondary | ICD-10-CM | POA: Diagnosis not present

## 2022-10-16 DIAGNOSIS — Z89512 Acquired absence of left leg below knee: Secondary | ICD-10-CM | POA: Diagnosis not present

## 2022-10-16 DIAGNOSIS — Z89511 Acquired absence of right leg below knee: Secondary | ICD-10-CM | POA: Diagnosis not present

## 2022-10-16 DIAGNOSIS — C07 Malignant neoplasm of parotid gland: Secondary | ICD-10-CM | POA: Diagnosis not present

## 2022-10-16 DIAGNOSIS — E1151 Type 2 diabetes mellitus with diabetic peripheral angiopathy without gangrene: Secondary | ICD-10-CM | POA: Diagnosis not present

## 2022-10-16 DIAGNOSIS — E1142 Type 2 diabetes mellitus with diabetic polyneuropathy: Secondary | ICD-10-CM | POA: Diagnosis not present

## 2022-10-16 DIAGNOSIS — I4819 Other persistent atrial fibrillation: Secondary | ICD-10-CM | POA: Diagnosis not present

## 2022-10-16 DIAGNOSIS — L97929 Non-pressure chronic ulcer of unspecified part of left lower leg with unspecified severity: Secondary | ICD-10-CM | POA: Diagnosis not present

## 2022-10-16 DIAGNOSIS — Z510 Encounter for antineoplastic radiation therapy: Secondary | ICD-10-CM | POA: Diagnosis not present

## 2022-10-16 DIAGNOSIS — T8789 Other complications of amputation stump: Secondary | ICD-10-CM | POA: Diagnosis not present

## 2022-10-16 DIAGNOSIS — D469 Myelodysplastic syndrome, unspecified: Secondary | ICD-10-CM | POA: Diagnosis not present

## 2022-10-17 DIAGNOSIS — R432 Parageusia: Secondary | ICD-10-CM | POA: Diagnosis not present

## 2022-10-17 DIAGNOSIS — K1230 Oral mucositis (ulcerative), unspecified: Secondary | ICD-10-CM | POA: Diagnosis not present

## 2022-10-17 DIAGNOSIS — R252 Cramp and spasm: Secondary | ICD-10-CM | POA: Diagnosis not present

## 2022-10-17 DIAGNOSIS — C4492 Squamous cell carcinoma of skin, unspecified: Secondary | ICD-10-CM | POA: Diagnosis not present

## 2022-10-17 DIAGNOSIS — Z923 Personal history of irradiation: Secondary | ICD-10-CM | POA: Diagnosis not present

## 2022-10-17 DIAGNOSIS — Z1159 Encounter for screening for other viral diseases: Secondary | ICD-10-CM | POA: Diagnosis not present

## 2022-10-17 DIAGNOSIS — R1313 Dysphagia, pharyngeal phase: Secondary | ICD-10-CM | POA: Diagnosis not present

## 2022-10-17 DIAGNOSIS — H938X1 Other specified disorders of right ear: Secondary | ICD-10-CM | POA: Diagnosis not present

## 2022-10-17 DIAGNOSIS — C44320 Squamous cell carcinoma of skin of unspecified parts of face: Secondary | ICD-10-CM | POA: Diagnosis not present

## 2022-10-17 DIAGNOSIS — C07 Malignant neoplasm of parotid gland: Secondary | ICD-10-CM | POA: Diagnosis not present

## 2022-10-17 DIAGNOSIS — K117 Disturbances of salivary secretion: Secondary | ICD-10-CM | POA: Diagnosis not present

## 2022-10-17 DIAGNOSIS — R058 Other specified cough: Secondary | ICD-10-CM | POA: Diagnosis not present

## 2022-10-17 DIAGNOSIS — E063 Autoimmune thyroiditis: Secondary | ICD-10-CM | POA: Diagnosis not present

## 2022-10-18 DIAGNOSIS — C4492 Squamous cell carcinoma of skin, unspecified: Secondary | ICD-10-CM | POA: Diagnosis not present

## 2022-10-18 DIAGNOSIS — C44320 Squamous cell carcinoma of skin of unspecified parts of face: Secondary | ICD-10-CM | POA: Diagnosis not present

## 2022-10-18 DIAGNOSIS — Z1159 Encounter for screening for other viral diseases: Secondary | ICD-10-CM | POA: Diagnosis not present

## 2022-10-18 DIAGNOSIS — C07 Malignant neoplasm of parotid gland: Secondary | ICD-10-CM | POA: Diagnosis not present

## 2022-10-18 DIAGNOSIS — E063 Autoimmune thyroiditis: Secondary | ICD-10-CM | POA: Diagnosis not present

## 2022-10-21 DIAGNOSIS — C07 Malignant neoplasm of parotid gland: Secondary | ICD-10-CM | POA: Diagnosis not present

## 2022-10-21 DIAGNOSIS — M25529 Pain in unspecified elbow: Secondary | ICD-10-CM | POA: Diagnosis not present

## 2022-10-21 DIAGNOSIS — C44320 Squamous cell carcinoma of skin of unspecified parts of face: Secondary | ICD-10-CM | POA: Diagnosis not present

## 2022-10-21 DIAGNOSIS — Z1159 Encounter for screening for other viral diseases: Secondary | ICD-10-CM | POA: Diagnosis not present

## 2022-10-21 DIAGNOSIS — R109 Unspecified abdominal pain: Secondary | ICD-10-CM | POA: Diagnosis not present

## 2022-10-21 DIAGNOSIS — K5900 Constipation, unspecified: Secondary | ICD-10-CM | POA: Diagnosis not present

## 2022-10-21 DIAGNOSIS — K469 Unspecified abdominal hernia without obstruction or gangrene: Secondary | ICD-10-CM | POA: Diagnosis not present

## 2022-10-21 DIAGNOSIS — Z6820 Body mass index (BMI) 20.0-20.9, adult: Secondary | ICD-10-CM | POA: Diagnosis not present

## 2022-10-21 DIAGNOSIS — R030 Elevated blood-pressure reading, without diagnosis of hypertension: Secondary | ICD-10-CM | POA: Diagnosis not present

## 2022-10-21 DIAGNOSIS — E063 Autoimmune thyroiditis: Secondary | ICD-10-CM | POA: Diagnosis not present

## 2022-10-21 DIAGNOSIS — C4492 Squamous cell carcinoma of skin, unspecified: Secondary | ICD-10-CM | POA: Diagnosis not present

## 2022-10-22 DIAGNOSIS — D469 Myelodysplastic syndrome, unspecified: Secondary | ICD-10-CM | POA: Diagnosis not present

## 2022-10-22 DIAGNOSIS — E1151 Type 2 diabetes mellitus with diabetic peripheral angiopathy without gangrene: Secondary | ICD-10-CM | POA: Diagnosis not present

## 2022-10-22 DIAGNOSIS — Z89512 Acquired absence of left leg below knee: Secondary | ICD-10-CM | POA: Diagnosis not present

## 2022-10-22 DIAGNOSIS — Z89511 Acquired absence of right leg below knee: Secondary | ICD-10-CM | POA: Diagnosis not present

## 2022-10-22 DIAGNOSIS — I4819 Other persistent atrial fibrillation: Secondary | ICD-10-CM | POA: Diagnosis not present

## 2022-10-22 DIAGNOSIS — E1142 Type 2 diabetes mellitus with diabetic polyneuropathy: Secondary | ICD-10-CM | POA: Diagnosis not present

## 2022-10-24 DIAGNOSIS — D472 Monoclonal gammopathy: Secondary | ICD-10-CM | POA: Diagnosis not present

## 2022-10-24 DIAGNOSIS — D469 Myelodysplastic syndrome, unspecified: Secondary | ICD-10-CM | POA: Diagnosis not present

## 2022-10-24 DIAGNOSIS — E063 Autoimmune thyroiditis: Secondary | ICD-10-CM | POA: Diagnosis not present

## 2022-10-24 DIAGNOSIS — D638 Anemia in other chronic diseases classified elsewhere: Secondary | ICD-10-CM | POA: Diagnosis not present

## 2022-10-25 DIAGNOSIS — D469 Myelodysplastic syndrome, unspecified: Secondary | ICD-10-CM | POA: Diagnosis not present

## 2022-10-25 DIAGNOSIS — I4819 Other persistent atrial fibrillation: Secondary | ICD-10-CM | POA: Diagnosis not present

## 2022-10-25 DIAGNOSIS — Z89511 Acquired absence of right leg below knee: Secondary | ICD-10-CM | POA: Diagnosis not present

## 2022-10-25 DIAGNOSIS — E1151 Type 2 diabetes mellitus with diabetic peripheral angiopathy without gangrene: Secondary | ICD-10-CM | POA: Diagnosis not present

## 2022-10-25 DIAGNOSIS — E1142 Type 2 diabetes mellitus with diabetic polyneuropathy: Secondary | ICD-10-CM | POA: Diagnosis not present

## 2022-10-25 DIAGNOSIS — Z89512 Acquired absence of left leg below knee: Secondary | ICD-10-CM | POA: Diagnosis not present

## 2022-10-28 DIAGNOSIS — E1142 Type 2 diabetes mellitus with diabetic polyneuropathy: Secondary | ICD-10-CM | POA: Diagnosis not present

## 2022-10-28 DIAGNOSIS — Z89511 Acquired absence of right leg below knee: Secondary | ICD-10-CM | POA: Diagnosis not present

## 2022-10-28 DIAGNOSIS — I4819 Other persistent atrial fibrillation: Secondary | ICD-10-CM | POA: Diagnosis not present

## 2022-10-28 DIAGNOSIS — E1151 Type 2 diabetes mellitus with diabetic peripheral angiopathy without gangrene: Secondary | ICD-10-CM | POA: Diagnosis not present

## 2022-10-28 DIAGNOSIS — D469 Myelodysplastic syndrome, unspecified: Secondary | ICD-10-CM | POA: Diagnosis not present

## 2022-10-28 DIAGNOSIS — Z89512 Acquired absence of left leg below knee: Secondary | ICD-10-CM | POA: Diagnosis not present

## 2022-10-29 DIAGNOSIS — E1151 Type 2 diabetes mellitus with diabetic peripheral angiopathy without gangrene: Secondary | ICD-10-CM | POA: Diagnosis not present

## 2022-10-29 DIAGNOSIS — T380X5A Adverse effect of glucocorticoids and synthetic analogues, initial encounter: Secondary | ICD-10-CM | POA: Diagnosis not present

## 2022-10-29 DIAGNOSIS — L89890 Pressure ulcer of other site, unstageable: Secondary | ICD-10-CM | POA: Diagnosis not present

## 2022-10-29 DIAGNOSIS — I96 Gangrene, not elsewhere classified: Secondary | ICD-10-CM | POA: Diagnosis not present

## 2022-10-29 DIAGNOSIS — R234 Changes in skin texture: Secondary | ICD-10-CM | POA: Diagnosis not present

## 2022-10-29 DIAGNOSIS — S8011XA Contusion of right lower leg, initial encounter: Secondary | ICD-10-CM | POA: Diagnosis not present

## 2022-10-29 DIAGNOSIS — D469 Myelodysplastic syndrome, unspecified: Secondary | ICD-10-CM | POA: Diagnosis not present

## 2022-10-29 DIAGNOSIS — M818 Other osteoporosis without current pathological fracture: Secondary | ICD-10-CM | POA: Diagnosis not present

## 2022-10-29 DIAGNOSIS — L97822 Non-pressure chronic ulcer of other part of left lower leg with fat layer exposed: Secondary | ICD-10-CM | POA: Diagnosis not present

## 2022-10-29 DIAGNOSIS — Z89512 Acquired absence of left leg below knee: Secondary | ICD-10-CM | POA: Diagnosis not present

## 2022-10-29 DIAGNOSIS — Z89511 Acquired absence of right leg below knee: Secondary | ICD-10-CM | POA: Diagnosis not present

## 2022-10-29 DIAGNOSIS — I70248 Atherosclerosis of native arteries of left leg with ulceration of other part of lower left leg: Secondary | ICD-10-CM | POA: Diagnosis not present

## 2022-10-30 DIAGNOSIS — Z89512 Acquired absence of left leg below knee: Secondary | ICD-10-CM | POA: Diagnosis not present

## 2022-10-30 DIAGNOSIS — I4819 Other persistent atrial fibrillation: Secondary | ICD-10-CM | POA: Diagnosis not present

## 2022-10-30 DIAGNOSIS — Z89511 Acquired absence of right leg below knee: Secondary | ICD-10-CM | POA: Diagnosis not present

## 2022-10-30 DIAGNOSIS — E1142 Type 2 diabetes mellitus with diabetic polyneuropathy: Secondary | ICD-10-CM | POA: Diagnosis not present

## 2022-10-30 DIAGNOSIS — E1151 Type 2 diabetes mellitus with diabetic peripheral angiopathy without gangrene: Secondary | ICD-10-CM | POA: Diagnosis not present

## 2022-10-30 DIAGNOSIS — D469 Myelodysplastic syndrome, unspecified: Secondary | ICD-10-CM | POA: Diagnosis not present

## 2022-11-01 DIAGNOSIS — D469 Myelodysplastic syndrome, unspecified: Secondary | ICD-10-CM | POA: Diagnosis not present

## 2022-11-01 DIAGNOSIS — D472 Monoclonal gammopathy: Secondary | ICD-10-CM | POA: Diagnosis not present

## 2022-11-01 DIAGNOSIS — I4819 Other persistent atrial fibrillation: Secondary | ICD-10-CM | POA: Diagnosis not present

## 2022-11-01 DIAGNOSIS — Z89512 Acquired absence of left leg below knee: Secondary | ICD-10-CM | POA: Diagnosis not present

## 2022-11-01 DIAGNOSIS — E1142 Type 2 diabetes mellitus with diabetic polyneuropathy: Secondary | ICD-10-CM | POA: Diagnosis not present

## 2022-11-01 DIAGNOSIS — E1151 Type 2 diabetes mellitus with diabetic peripheral angiopathy without gangrene: Secondary | ICD-10-CM | POA: Diagnosis not present

## 2022-11-01 DIAGNOSIS — C4492 Squamous cell carcinoma of skin, unspecified: Secondary | ICD-10-CM | POA: Diagnosis not present

## 2022-11-01 DIAGNOSIS — Z89511 Acquired absence of right leg below knee: Secondary | ICD-10-CM | POA: Diagnosis not present

## 2022-11-03 DIAGNOSIS — Z87891 Personal history of nicotine dependence: Secondary | ICD-10-CM | POA: Diagnosis not present

## 2022-11-03 DIAGNOSIS — E1151 Type 2 diabetes mellitus with diabetic peripheral angiopathy without gangrene: Secondary | ICD-10-CM | POA: Diagnosis not present

## 2022-11-03 DIAGNOSIS — D469 Myelodysplastic syndrome, unspecified: Secondary | ICD-10-CM | POA: Diagnosis not present

## 2022-11-03 DIAGNOSIS — Z89511 Acquired absence of right leg below knee: Secondary | ICD-10-CM | POA: Diagnosis not present

## 2022-11-03 DIAGNOSIS — Z7901 Long term (current) use of anticoagulants: Secondary | ICD-10-CM | POA: Diagnosis not present

## 2022-11-03 DIAGNOSIS — Z7982 Long term (current) use of aspirin: Secondary | ICD-10-CM | POA: Diagnosis not present

## 2022-11-03 DIAGNOSIS — I4819 Other persistent atrial fibrillation: Secondary | ICD-10-CM | POA: Diagnosis not present

## 2022-11-03 DIAGNOSIS — M179 Osteoarthritis of knee, unspecified: Secondary | ICD-10-CM | POA: Diagnosis not present

## 2022-11-03 DIAGNOSIS — Z993 Dependence on wheelchair: Secondary | ICD-10-CM | POA: Diagnosis not present

## 2022-11-03 DIAGNOSIS — D472 Monoclonal gammopathy: Secondary | ICD-10-CM | POA: Diagnosis not present

## 2022-11-03 DIAGNOSIS — Z8546 Personal history of malignant neoplasm of prostate: Secondary | ICD-10-CM | POA: Diagnosis not present

## 2022-11-03 DIAGNOSIS — Z7952 Long term (current) use of systemic steroids: Secondary | ICD-10-CM | POA: Diagnosis not present

## 2022-11-03 DIAGNOSIS — M5020 Other cervical disc displacement, unspecified cervical region: Secondary | ICD-10-CM | POA: Diagnosis not present

## 2022-11-03 DIAGNOSIS — E1142 Type 2 diabetes mellitus with diabetic polyneuropathy: Secondary | ICD-10-CM | POA: Diagnosis not present

## 2022-11-03 DIAGNOSIS — C4492 Squamous cell carcinoma of skin, unspecified: Secondary | ICD-10-CM | POA: Diagnosis not present

## 2022-11-03 DIAGNOSIS — Z7984 Long term (current) use of oral hypoglycemic drugs: Secondary | ICD-10-CM | POA: Diagnosis not present

## 2022-11-03 DIAGNOSIS — D630 Anemia in neoplastic disease: Secondary | ICD-10-CM | POA: Diagnosis not present

## 2022-11-03 DIAGNOSIS — Z89512 Acquired absence of left leg below knee: Secondary | ICD-10-CM | POA: Diagnosis not present

## 2022-11-03 DIAGNOSIS — C7989 Secondary malignant neoplasm of other specified sites: Secondary | ICD-10-CM | POA: Diagnosis not present

## 2022-11-05 DIAGNOSIS — M79644 Pain in right finger(s): Secondary | ICD-10-CM | POA: Diagnosis not present

## 2022-11-06 DIAGNOSIS — T8789 Other complications of amputation stump: Secondary | ICD-10-CM | POA: Diagnosis not present

## 2022-11-06 DIAGNOSIS — Z89511 Acquired absence of right leg below knee: Secondary | ICD-10-CM | POA: Diagnosis not present

## 2022-11-06 DIAGNOSIS — Z8546 Personal history of malignant neoplasm of prostate: Secondary | ICD-10-CM | POA: Diagnosis not present

## 2022-11-06 DIAGNOSIS — L89890 Pressure ulcer of other site, unstageable: Secondary | ICD-10-CM | POA: Diagnosis not present

## 2022-11-06 DIAGNOSIS — I6529 Occlusion and stenosis of unspecified carotid artery: Secondary | ICD-10-CM | POA: Diagnosis not present

## 2022-11-06 DIAGNOSIS — Z85828 Personal history of other malignant neoplasm of skin: Secondary | ICD-10-CM | POA: Diagnosis not present

## 2022-11-06 DIAGNOSIS — S80811A Abrasion, right lower leg, initial encounter: Secondary | ICD-10-CM | POA: Diagnosis not present

## 2022-11-06 DIAGNOSIS — S80812A Abrasion, left lower leg, initial encounter: Secondary | ICD-10-CM | POA: Diagnosis not present

## 2022-11-06 DIAGNOSIS — D469 Myelodysplastic syndrome, unspecified: Secondary | ICD-10-CM | POA: Diagnosis not present

## 2022-11-06 DIAGNOSIS — L97828 Non-pressure chronic ulcer of other part of left lower leg with other specified severity: Secondary | ICD-10-CM | POA: Diagnosis not present

## 2022-11-06 DIAGNOSIS — L97919 Non-pressure chronic ulcer of unspecified part of right lower leg with unspecified severity: Secondary | ICD-10-CM | POA: Diagnosis not present

## 2022-11-06 DIAGNOSIS — I70261 Atherosclerosis of native arteries of extremities with gangrene, right leg: Secondary | ICD-10-CM | POA: Diagnosis not present

## 2022-11-06 DIAGNOSIS — Z86718 Personal history of other venous thrombosis and embolism: Secondary | ICD-10-CM | POA: Diagnosis not present

## 2022-11-06 DIAGNOSIS — E1151 Type 2 diabetes mellitus with diabetic peripheral angiopathy without gangrene: Secondary | ICD-10-CM | POA: Diagnosis not present

## 2022-11-06 DIAGNOSIS — I4891 Unspecified atrial fibrillation: Secondary | ICD-10-CM | POA: Diagnosis not present

## 2022-11-06 DIAGNOSIS — S88112D Complete traumatic amputation at level between knee and ankle, left lower leg, subsequent encounter: Secondary | ICD-10-CM | POA: Diagnosis not present

## 2022-11-06 DIAGNOSIS — Z8673 Personal history of transient ischemic attack (TIA), and cerebral infarction without residual deficits: Secondary | ICD-10-CM | POA: Diagnosis not present

## 2022-11-07 DIAGNOSIS — Z89511 Acquired absence of right leg below knee: Secondary | ICD-10-CM | POA: Diagnosis not present

## 2022-11-07 DIAGNOSIS — Z89512 Acquired absence of left leg below knee: Secondary | ICD-10-CM | POA: Diagnosis not present

## 2022-11-07 DIAGNOSIS — E1151 Type 2 diabetes mellitus with diabetic peripheral angiopathy without gangrene: Secondary | ICD-10-CM | POA: Diagnosis not present

## 2022-11-07 DIAGNOSIS — E1142 Type 2 diabetes mellitus with diabetic polyneuropathy: Secondary | ICD-10-CM | POA: Diagnosis not present

## 2022-11-07 DIAGNOSIS — D469 Myelodysplastic syndrome, unspecified: Secondary | ICD-10-CM | POA: Diagnosis not present

## 2022-11-07 DIAGNOSIS — I4819 Other persistent atrial fibrillation: Secondary | ICD-10-CM | POA: Diagnosis not present

## 2022-11-12 DIAGNOSIS — M25529 Pain in unspecified elbow: Secondary | ICD-10-CM | POA: Diagnosis not present

## 2022-11-12 DIAGNOSIS — R109 Unspecified abdominal pain: Secondary | ICD-10-CM | POA: Diagnosis not present

## 2022-11-12 DIAGNOSIS — R030 Elevated blood-pressure reading, without diagnosis of hypertension: Secondary | ICD-10-CM | POA: Diagnosis not present

## 2022-11-12 DIAGNOSIS — K5900 Constipation, unspecified: Secondary | ICD-10-CM | POA: Diagnosis not present

## 2022-11-12 DIAGNOSIS — K469 Unspecified abdominal hernia without obstruction or gangrene: Secondary | ICD-10-CM | POA: Diagnosis not present

## 2022-11-12 DIAGNOSIS — Z6821 Body mass index (BMI) 21.0-21.9, adult: Secondary | ICD-10-CM | POA: Diagnosis not present

## 2022-11-13 DIAGNOSIS — D469 Myelodysplastic syndrome, unspecified: Secondary | ICD-10-CM | POA: Diagnosis not present

## 2022-11-13 DIAGNOSIS — E1151 Type 2 diabetes mellitus with diabetic peripheral angiopathy without gangrene: Secondary | ICD-10-CM | POA: Diagnosis not present

## 2022-11-13 DIAGNOSIS — E1142 Type 2 diabetes mellitus with diabetic polyneuropathy: Secondary | ICD-10-CM | POA: Diagnosis not present

## 2022-11-13 DIAGNOSIS — Z89511 Acquired absence of right leg below knee: Secondary | ICD-10-CM | POA: Diagnosis not present

## 2022-11-13 DIAGNOSIS — I4819 Other persistent atrial fibrillation: Secondary | ICD-10-CM | POA: Diagnosis not present

## 2022-11-13 DIAGNOSIS — Z89512 Acquired absence of left leg below knee: Secondary | ICD-10-CM | POA: Diagnosis not present

## 2022-11-15 DIAGNOSIS — Z1159 Encounter for screening for other viral diseases: Secondary | ICD-10-CM | POA: Diagnosis not present

## 2022-11-15 DIAGNOSIS — C44320 Squamous cell carcinoma of skin of unspecified parts of face: Secondary | ICD-10-CM | POA: Diagnosis not present

## 2022-11-15 DIAGNOSIS — D638 Anemia in other chronic diseases classified elsewhere: Secondary | ICD-10-CM | POA: Diagnosis not present

## 2022-11-15 DIAGNOSIS — Z95828 Presence of other vascular implants and grafts: Secondary | ICD-10-CM | POA: Diagnosis not present

## 2022-11-15 DIAGNOSIS — D469 Myelodysplastic syndrome, unspecified: Secondary | ICD-10-CM | POA: Diagnosis not present

## 2022-11-19 DIAGNOSIS — Z8546 Personal history of malignant neoplasm of prostate: Secondary | ICD-10-CM | POA: Diagnosis not present

## 2022-11-19 DIAGNOSIS — I69398 Other sequelae of cerebral infarction: Secondary | ICD-10-CM | POA: Diagnosis not present

## 2022-11-19 DIAGNOSIS — Z89511 Acquired absence of right leg below knee: Secondary | ICD-10-CM | POA: Diagnosis not present

## 2022-11-19 DIAGNOSIS — S81802A Unspecified open wound, left lower leg, initial encounter: Secondary | ICD-10-CM | POA: Diagnosis not present

## 2022-11-19 DIAGNOSIS — Z86718 Personal history of other venous thrombosis and embolism: Secondary | ICD-10-CM | POA: Diagnosis not present

## 2022-11-19 DIAGNOSIS — L89890 Pressure ulcer of other site, unstageable: Secondary | ICD-10-CM | POA: Diagnosis not present

## 2022-11-19 DIAGNOSIS — S80212D Abrasion, left knee, subsequent encounter: Secondary | ICD-10-CM | POA: Diagnosis not present

## 2022-11-19 DIAGNOSIS — Z89512 Acquired absence of left leg below knee: Secondary | ICD-10-CM | POA: Diagnosis not present

## 2022-11-19 DIAGNOSIS — T8754 Necrosis of amputation stump, left lower extremity: Secondary | ICD-10-CM | POA: Diagnosis not present

## 2022-11-19 DIAGNOSIS — Z859 Personal history of malignant neoplasm, unspecified: Secondary | ICD-10-CM | POA: Diagnosis not present

## 2022-11-19 DIAGNOSIS — Z923 Personal history of irradiation: Secondary | ICD-10-CM | POA: Diagnosis not present

## 2022-11-19 DIAGNOSIS — E1151 Type 2 diabetes mellitus with diabetic peripheral angiopathy without gangrene: Secondary | ICD-10-CM | POA: Diagnosis not present

## 2022-11-19 DIAGNOSIS — S80811D Abrasion, right lower leg, subsequent encounter: Secondary | ICD-10-CM | POA: Diagnosis not present

## 2022-11-19 DIAGNOSIS — I4891 Unspecified atrial fibrillation: Secondary | ICD-10-CM | POA: Diagnosis not present

## 2022-11-20 DIAGNOSIS — D0462 Carcinoma in situ of skin of left upper limb, including shoulder: Secondary | ICD-10-CM | POA: Diagnosis not present

## 2022-11-20 DIAGNOSIS — Z89511 Acquired absence of right leg below knee: Secondary | ICD-10-CM | POA: Diagnosis not present

## 2022-11-20 DIAGNOSIS — C44629 Squamous cell carcinoma of skin of left upper limb, including shoulder: Secondary | ICD-10-CM | POA: Diagnosis not present

## 2022-11-20 DIAGNOSIS — D469 Myelodysplastic syndrome, unspecified: Secondary | ICD-10-CM | POA: Diagnosis not present

## 2022-11-20 DIAGNOSIS — L570 Actinic keratosis: Secondary | ICD-10-CM | POA: Diagnosis not present

## 2022-11-20 DIAGNOSIS — E1151 Type 2 diabetes mellitus with diabetic peripheral angiopathy without gangrene: Secondary | ICD-10-CM | POA: Diagnosis not present

## 2022-11-20 DIAGNOSIS — Z89512 Acquired absence of left leg below knee: Secondary | ICD-10-CM | POA: Diagnosis not present

## 2022-11-20 DIAGNOSIS — I4819 Other persistent atrial fibrillation: Secondary | ICD-10-CM | POA: Diagnosis not present

## 2022-11-20 DIAGNOSIS — E1142 Type 2 diabetes mellitus with diabetic polyneuropathy: Secondary | ICD-10-CM | POA: Diagnosis not present

## 2022-11-21 DIAGNOSIS — M79644 Pain in right finger(s): Secondary | ICD-10-CM | POA: Diagnosis not present

## 2022-11-25 DIAGNOSIS — D469 Myelodysplastic syndrome, unspecified: Secondary | ICD-10-CM | POA: Diagnosis not present

## 2022-11-25 DIAGNOSIS — Z89512 Acquired absence of left leg below knee: Secondary | ICD-10-CM | POA: Diagnosis not present

## 2022-11-25 DIAGNOSIS — E1151 Type 2 diabetes mellitus with diabetic peripheral angiopathy without gangrene: Secondary | ICD-10-CM | POA: Diagnosis not present

## 2022-11-25 DIAGNOSIS — Z89511 Acquired absence of right leg below knee: Secondary | ICD-10-CM | POA: Diagnosis not present

## 2022-11-25 DIAGNOSIS — E1142 Type 2 diabetes mellitus with diabetic polyneuropathy: Secondary | ICD-10-CM | POA: Diagnosis not present

## 2022-11-25 DIAGNOSIS — I4819 Other persistent atrial fibrillation: Secondary | ICD-10-CM | POA: Diagnosis not present

## 2022-11-28 DIAGNOSIS — L570 Actinic keratosis: Secondary | ICD-10-CM | POA: Diagnosis not present

## 2022-11-28 DIAGNOSIS — C44629 Squamous cell carcinoma of skin of left upper limb, including shoulder: Secondary | ICD-10-CM | POA: Diagnosis not present

## 2022-11-29 DIAGNOSIS — E1142 Type 2 diabetes mellitus with diabetic polyneuropathy: Secondary | ICD-10-CM | POA: Diagnosis not present

## 2022-11-29 DIAGNOSIS — I4819 Other persistent atrial fibrillation: Secondary | ICD-10-CM | POA: Diagnosis not present

## 2022-11-29 DIAGNOSIS — Z89511 Acquired absence of right leg below knee: Secondary | ICD-10-CM | POA: Diagnosis not present

## 2022-11-29 DIAGNOSIS — Z89512 Acquired absence of left leg below knee: Secondary | ICD-10-CM | POA: Diagnosis not present

## 2022-11-29 DIAGNOSIS — E1151 Type 2 diabetes mellitus with diabetic peripheral angiopathy without gangrene: Secondary | ICD-10-CM | POA: Diagnosis not present

## 2022-11-29 DIAGNOSIS — D469 Myelodysplastic syndrome, unspecified: Secondary | ICD-10-CM | POA: Diagnosis not present

## 2022-12-03 DIAGNOSIS — Z7984 Long term (current) use of oral hypoglycemic drugs: Secondary | ICD-10-CM | POA: Diagnosis not present

## 2022-12-03 DIAGNOSIS — E1151 Type 2 diabetes mellitus with diabetic peripheral angiopathy without gangrene: Secondary | ICD-10-CM | POA: Diagnosis not present

## 2022-12-03 DIAGNOSIS — Z8546 Personal history of malignant neoplasm of prostate: Secondary | ICD-10-CM | POA: Diagnosis not present

## 2022-12-03 DIAGNOSIS — D630 Anemia in neoplastic disease: Secondary | ICD-10-CM | POA: Diagnosis not present

## 2022-12-03 DIAGNOSIS — D472 Monoclonal gammopathy: Secondary | ICD-10-CM | POA: Diagnosis not present

## 2022-12-03 DIAGNOSIS — D469 Myelodysplastic syndrome, unspecified: Secondary | ICD-10-CM | POA: Diagnosis not present

## 2022-12-03 DIAGNOSIS — Z8673 Personal history of transient ischemic attack (TIA), and cerebral infarction without residual deficits: Secondary | ICD-10-CM | POA: Diagnosis not present

## 2022-12-03 DIAGNOSIS — M179 Osteoarthritis of knee, unspecified: Secondary | ICD-10-CM | POA: Diagnosis not present

## 2022-12-03 DIAGNOSIS — C4492 Squamous cell carcinoma of skin, unspecified: Secondary | ICD-10-CM | POA: Diagnosis not present

## 2022-12-03 DIAGNOSIS — I5020 Unspecified systolic (congestive) heart failure: Secondary | ICD-10-CM | POA: Diagnosis not present

## 2022-12-03 DIAGNOSIS — M5020 Other cervical disc displacement, unspecified cervical region: Secondary | ICD-10-CM | POA: Diagnosis not present

## 2022-12-03 DIAGNOSIS — Z7901 Long term (current) use of anticoagulants: Secondary | ICD-10-CM | POA: Diagnosis not present

## 2022-12-03 DIAGNOSIS — C7989 Secondary malignant neoplasm of other specified sites: Secondary | ICD-10-CM | POA: Diagnosis not present

## 2022-12-03 DIAGNOSIS — Z89511 Acquired absence of right leg below knee: Secondary | ICD-10-CM | POA: Diagnosis not present

## 2022-12-03 DIAGNOSIS — Z87891 Personal history of nicotine dependence: Secondary | ICD-10-CM | POA: Diagnosis not present

## 2022-12-03 DIAGNOSIS — Z993 Dependence on wheelchair: Secondary | ICD-10-CM | POA: Diagnosis not present

## 2022-12-03 DIAGNOSIS — Z79899 Other long term (current) drug therapy: Secondary | ICD-10-CM | POA: Diagnosis not present

## 2022-12-03 DIAGNOSIS — I4819 Other persistent atrial fibrillation: Secondary | ICD-10-CM | POA: Diagnosis not present

## 2022-12-03 DIAGNOSIS — Z7982 Long term (current) use of aspirin: Secondary | ICD-10-CM | POA: Diagnosis not present

## 2022-12-03 DIAGNOSIS — Z7952 Long term (current) use of systemic steroids: Secondary | ICD-10-CM | POA: Diagnosis not present

## 2022-12-03 DIAGNOSIS — M8580 Other specified disorders of bone density and structure, unspecified site: Secondary | ICD-10-CM | POA: Diagnosis not present

## 2022-12-03 DIAGNOSIS — Z89512 Acquired absence of left leg below knee: Secondary | ICD-10-CM | POA: Diagnosis not present

## 2022-12-03 DIAGNOSIS — Z792 Long term (current) use of antibiotics: Secondary | ICD-10-CM | POA: Diagnosis not present

## 2022-12-03 DIAGNOSIS — Z8739 Personal history of other diseases of the musculoskeletal system and connective tissue: Secondary | ICD-10-CM | POA: Diagnosis not present

## 2022-12-03 DIAGNOSIS — E1142 Type 2 diabetes mellitus with diabetic polyneuropathy: Secondary | ICD-10-CM | POA: Diagnosis not present

## 2022-12-04 DIAGNOSIS — C7989 Secondary malignant neoplasm of other specified sites: Secondary | ICD-10-CM | POA: Diagnosis not present

## 2022-12-04 DIAGNOSIS — E1142 Type 2 diabetes mellitus with diabetic polyneuropathy: Secondary | ICD-10-CM | POA: Diagnosis not present

## 2022-12-04 DIAGNOSIS — C4492 Squamous cell carcinoma of skin, unspecified: Secondary | ICD-10-CM | POA: Diagnosis not present

## 2022-12-04 DIAGNOSIS — E1151 Type 2 diabetes mellitus with diabetic peripheral angiopathy without gangrene: Secondary | ICD-10-CM | POA: Diagnosis not present

## 2022-12-04 DIAGNOSIS — I4819 Other persistent atrial fibrillation: Secondary | ICD-10-CM | POA: Diagnosis not present

## 2022-12-04 DIAGNOSIS — D469 Myelodysplastic syndrome, unspecified: Secondary | ICD-10-CM | POA: Diagnosis not present

## 2022-12-09 DIAGNOSIS — C07 Malignant neoplasm of parotid gland: Secondary | ICD-10-CM | POA: Diagnosis not present

## 2022-12-09 DIAGNOSIS — E063 Autoimmune thyroiditis: Secondary | ICD-10-CM | POA: Diagnosis not present

## 2022-12-09 DIAGNOSIS — Z79899 Other long term (current) drug therapy: Secondary | ICD-10-CM | POA: Diagnosis not present

## 2022-12-09 DIAGNOSIS — C44320 Squamous cell carcinoma of skin of unspecified parts of face: Secondary | ICD-10-CM | POA: Diagnosis not present

## 2022-12-09 DIAGNOSIS — D469 Myelodysplastic syndrome, unspecified: Secondary | ICD-10-CM | POA: Diagnosis not present

## 2022-12-10 DIAGNOSIS — M818 Other osteoporosis without current pathological fracture: Secondary | ICD-10-CM | POA: Diagnosis not present

## 2022-12-10 DIAGNOSIS — T380X5A Adverse effect of glucocorticoids and synthetic analogues, initial encounter: Secondary | ICD-10-CM | POA: Diagnosis not present

## 2022-12-10 DIAGNOSIS — D469 Myelodysplastic syndrome, unspecified: Secondary | ICD-10-CM | POA: Diagnosis not present

## 2022-12-11 DIAGNOSIS — E1142 Type 2 diabetes mellitus with diabetic polyneuropathy: Secondary | ICD-10-CM | POA: Diagnosis not present

## 2022-12-11 DIAGNOSIS — I4819 Other persistent atrial fibrillation: Secondary | ICD-10-CM | POA: Diagnosis not present

## 2022-12-11 DIAGNOSIS — C7989 Secondary malignant neoplasm of other specified sites: Secondary | ICD-10-CM | POA: Diagnosis not present

## 2022-12-11 DIAGNOSIS — E1151 Type 2 diabetes mellitus with diabetic peripheral angiopathy without gangrene: Secondary | ICD-10-CM | POA: Diagnosis not present

## 2022-12-11 DIAGNOSIS — D469 Myelodysplastic syndrome, unspecified: Secondary | ICD-10-CM | POA: Diagnosis not present

## 2022-12-11 DIAGNOSIS — C4492 Squamous cell carcinoma of skin, unspecified: Secondary | ICD-10-CM | POA: Diagnosis not present

## 2022-12-16 DIAGNOSIS — G629 Polyneuropathy, unspecified: Secondary | ICD-10-CM | POA: Diagnosis not present

## 2022-12-16 DIAGNOSIS — E1169 Type 2 diabetes mellitus with other specified complication: Secondary | ICD-10-CM | POA: Diagnosis not present

## 2022-12-17 DIAGNOSIS — S88111D Complete traumatic amputation at level between knee and ankle, right lower leg, subsequent encounter: Secondary | ICD-10-CM | POA: Diagnosis not present

## 2022-12-17 DIAGNOSIS — E11621 Type 2 diabetes mellitus with foot ulcer: Secondary | ICD-10-CM | POA: Diagnosis not present

## 2022-12-17 DIAGNOSIS — L97922 Non-pressure chronic ulcer of unspecified part of left lower leg with fat layer exposed: Secondary | ICD-10-CM | POA: Diagnosis not present

## 2022-12-17 DIAGNOSIS — Z86718 Personal history of other venous thrombosis and embolism: Secondary | ICD-10-CM | POA: Diagnosis not present

## 2022-12-17 DIAGNOSIS — L97825 Non-pressure chronic ulcer of other part of left lower leg with muscle involvement without evidence of necrosis: Secondary | ICD-10-CM | POA: Diagnosis not present

## 2022-12-17 DIAGNOSIS — T8789 Other complications of amputation stump: Secondary | ICD-10-CM | POA: Diagnosis not present

## 2022-12-17 DIAGNOSIS — S88112D Complete traumatic amputation at level between knee and ankle, left lower leg, subsequent encounter: Secondary | ICD-10-CM | POA: Diagnosis not present

## 2022-12-17 DIAGNOSIS — E1151 Type 2 diabetes mellitus with diabetic peripheral angiopathy without gangrene: Secondary | ICD-10-CM | POA: Diagnosis not present

## 2022-12-20 DIAGNOSIS — E1142 Type 2 diabetes mellitus with diabetic polyneuropathy: Secondary | ICD-10-CM | POA: Diagnosis not present

## 2022-12-20 DIAGNOSIS — D469 Myelodysplastic syndrome, unspecified: Secondary | ICD-10-CM | POA: Diagnosis not present

## 2022-12-20 DIAGNOSIS — E1151 Type 2 diabetes mellitus with diabetic peripheral angiopathy without gangrene: Secondary | ICD-10-CM | POA: Diagnosis not present

## 2022-12-20 DIAGNOSIS — C7989 Secondary malignant neoplasm of other specified sites: Secondary | ICD-10-CM | POA: Diagnosis not present

## 2022-12-20 DIAGNOSIS — C4492 Squamous cell carcinoma of skin, unspecified: Secondary | ICD-10-CM | POA: Diagnosis not present

## 2022-12-20 DIAGNOSIS — I4819 Other persistent atrial fibrillation: Secondary | ICD-10-CM | POA: Diagnosis not present

## 2022-12-25 DIAGNOSIS — C44320 Squamous cell carcinoma of skin of unspecified parts of face: Secondary | ICD-10-CM | POA: Diagnosis not present

## 2022-12-25 DIAGNOSIS — C07 Malignant neoplasm of parotid gland: Secondary | ICD-10-CM | POA: Diagnosis not present

## 2022-12-26 DIAGNOSIS — D469 Myelodysplastic syndrome, unspecified: Secondary | ICD-10-CM | POA: Diagnosis not present

## 2022-12-26 DIAGNOSIS — E1151 Type 2 diabetes mellitus with diabetic peripheral angiopathy without gangrene: Secondary | ICD-10-CM | POA: Diagnosis not present

## 2022-12-26 DIAGNOSIS — I4819 Other persistent atrial fibrillation: Secondary | ICD-10-CM | POA: Diagnosis not present

## 2022-12-26 DIAGNOSIS — E1142 Type 2 diabetes mellitus with diabetic polyneuropathy: Secondary | ICD-10-CM | POA: Diagnosis not present

## 2022-12-26 DIAGNOSIS — C7989 Secondary malignant neoplasm of other specified sites: Secondary | ICD-10-CM | POA: Diagnosis not present

## 2022-12-26 DIAGNOSIS — C4492 Squamous cell carcinoma of skin, unspecified: Secondary | ICD-10-CM | POA: Diagnosis not present

## 2022-12-27 DIAGNOSIS — E063 Autoimmune thyroiditis: Secondary | ICD-10-CM | POA: Diagnosis not present

## 2022-12-27 DIAGNOSIS — C07 Malignant neoplasm of parotid gland: Secondary | ICD-10-CM | POA: Diagnosis not present

## 2022-12-27 DIAGNOSIS — C4492 Squamous cell carcinoma of skin, unspecified: Secondary | ICD-10-CM | POA: Diagnosis not present

## 2022-12-27 DIAGNOSIS — Z1159 Encounter for screening for other viral diseases: Secondary | ICD-10-CM | POA: Diagnosis not present

## 2022-12-27 DIAGNOSIS — C44320 Squamous cell carcinoma of skin of unspecified parts of face: Secondary | ICD-10-CM | POA: Diagnosis not present

## 2022-12-30 DIAGNOSIS — Z95828 Presence of other vascular implants and grafts: Secondary | ICD-10-CM | POA: Diagnosis not present

## 2022-12-30 DIAGNOSIS — D469 Myelodysplastic syndrome, unspecified: Secondary | ICD-10-CM | POA: Diagnosis not present

## 2022-12-30 DIAGNOSIS — D649 Anemia, unspecified: Secondary | ICD-10-CM | POA: Diagnosis not present

## 2022-12-31 DIAGNOSIS — T380X5A Adverse effect of glucocorticoids and synthetic analogues, initial encounter: Secondary | ICD-10-CM | POA: Diagnosis not present

## 2022-12-31 DIAGNOSIS — D469 Myelodysplastic syndrome, unspecified: Secondary | ICD-10-CM | POA: Diagnosis not present

## 2022-12-31 DIAGNOSIS — M818 Other osteoporosis without current pathological fracture: Secondary | ICD-10-CM | POA: Diagnosis not present

## 2023-01-01 DIAGNOSIS — I4819 Other persistent atrial fibrillation: Secondary | ICD-10-CM | POA: Diagnosis not present

## 2023-01-01 DIAGNOSIS — E1142 Type 2 diabetes mellitus with diabetic polyneuropathy: Secondary | ICD-10-CM | POA: Diagnosis not present

## 2023-01-01 DIAGNOSIS — C7989 Secondary malignant neoplasm of other specified sites: Secondary | ICD-10-CM | POA: Diagnosis not present

## 2023-01-01 DIAGNOSIS — E1151 Type 2 diabetes mellitus with diabetic peripheral angiopathy without gangrene: Secondary | ICD-10-CM | POA: Diagnosis not present

## 2023-01-01 DIAGNOSIS — C4492 Squamous cell carcinoma of skin, unspecified: Secondary | ICD-10-CM | POA: Diagnosis not present

## 2023-01-01 DIAGNOSIS — D469 Myelodysplastic syndrome, unspecified: Secondary | ICD-10-CM | POA: Diagnosis not present

## 2023-01-02 DIAGNOSIS — Z8546 Personal history of malignant neoplasm of prostate: Secondary | ICD-10-CM | POA: Diagnosis not present

## 2023-01-02 DIAGNOSIS — E1142 Type 2 diabetes mellitus with diabetic polyneuropathy: Secondary | ICD-10-CM | POA: Diagnosis not present

## 2023-01-02 DIAGNOSIS — I4819 Other persistent atrial fibrillation: Secondary | ICD-10-CM | POA: Diagnosis not present

## 2023-01-02 DIAGNOSIS — M179 Osteoarthritis of knee, unspecified: Secondary | ICD-10-CM | POA: Diagnosis not present

## 2023-01-02 DIAGNOSIS — E1151 Type 2 diabetes mellitus with diabetic peripheral angiopathy without gangrene: Secondary | ICD-10-CM | POA: Diagnosis not present

## 2023-01-02 DIAGNOSIS — D472 Monoclonal gammopathy: Secondary | ICD-10-CM | POA: Diagnosis not present

## 2023-01-02 DIAGNOSIS — Z89512 Acquired absence of left leg below knee: Secondary | ICD-10-CM | POA: Diagnosis not present

## 2023-01-02 DIAGNOSIS — D630 Anemia in neoplastic disease: Secondary | ICD-10-CM | POA: Diagnosis not present

## 2023-01-02 DIAGNOSIS — Z792 Long term (current) use of antibiotics: Secondary | ICD-10-CM | POA: Diagnosis not present

## 2023-01-02 DIAGNOSIS — C7989 Secondary malignant neoplasm of other specified sites: Secondary | ICD-10-CM | POA: Diagnosis not present

## 2023-01-02 DIAGNOSIS — Z8673 Personal history of transient ischemic attack (TIA), and cerebral infarction without residual deficits: Secondary | ICD-10-CM | POA: Diagnosis not present

## 2023-01-02 DIAGNOSIS — Z7901 Long term (current) use of anticoagulants: Secondary | ICD-10-CM | POA: Diagnosis not present

## 2023-01-02 DIAGNOSIS — D469 Myelodysplastic syndrome, unspecified: Secondary | ICD-10-CM | POA: Diagnosis not present

## 2023-01-02 DIAGNOSIS — C4492 Squamous cell carcinoma of skin, unspecified: Secondary | ICD-10-CM | POA: Diagnosis not present

## 2023-01-02 DIAGNOSIS — M5020 Other cervical disc displacement, unspecified cervical region: Secondary | ICD-10-CM | POA: Diagnosis not present

## 2023-01-02 DIAGNOSIS — Z87891 Personal history of nicotine dependence: Secondary | ICD-10-CM | POA: Diagnosis not present

## 2023-01-02 DIAGNOSIS — Z7982 Long term (current) use of aspirin: Secondary | ICD-10-CM | POA: Diagnosis not present

## 2023-01-02 DIAGNOSIS — Z7952 Long term (current) use of systemic steroids: Secondary | ICD-10-CM | POA: Diagnosis not present

## 2023-01-02 DIAGNOSIS — I5020 Unspecified systolic (congestive) heart failure: Secondary | ICD-10-CM | POA: Diagnosis not present

## 2023-01-02 DIAGNOSIS — Z89511 Acquired absence of right leg below knee: Secondary | ICD-10-CM | POA: Diagnosis not present

## 2023-01-02 DIAGNOSIS — Z7984 Long term (current) use of oral hypoglycemic drugs: Secondary | ICD-10-CM | POA: Diagnosis not present

## 2023-01-02 DIAGNOSIS — Z993 Dependence on wheelchair: Secondary | ICD-10-CM | POA: Diagnosis not present

## 2023-01-07 DIAGNOSIS — I739 Peripheral vascular disease, unspecified: Secondary | ICD-10-CM | POA: Diagnosis not present

## 2023-01-07 DIAGNOSIS — E119 Type 2 diabetes mellitus without complications: Secondary | ICD-10-CM | POA: Diagnosis not present

## 2023-01-07 DIAGNOSIS — M7989 Other specified soft tissue disorders: Secondary | ICD-10-CM | POA: Diagnosis not present

## 2023-01-07 DIAGNOSIS — S88112S Complete traumatic amputation at level between knee and ankle, left lower leg, sequela: Secondary | ICD-10-CM | POA: Diagnosis not present

## 2023-01-07 DIAGNOSIS — S88112A Complete traumatic amputation at level between knee and ankle, left lower leg, initial encounter: Secondary | ICD-10-CM | POA: Diagnosis not present

## 2023-01-07 DIAGNOSIS — Z89512 Acquired absence of left leg below knee: Secondary | ICD-10-CM | POA: Diagnosis not present

## 2023-01-08 DIAGNOSIS — C7989 Secondary malignant neoplasm of other specified sites: Secondary | ICD-10-CM | POA: Diagnosis not present

## 2023-01-08 DIAGNOSIS — E1142 Type 2 diabetes mellitus with diabetic polyneuropathy: Secondary | ICD-10-CM | POA: Diagnosis not present

## 2023-01-08 DIAGNOSIS — I4819 Other persistent atrial fibrillation: Secondary | ICD-10-CM | POA: Diagnosis not present

## 2023-01-08 DIAGNOSIS — D469 Myelodysplastic syndrome, unspecified: Secondary | ICD-10-CM | POA: Diagnosis not present

## 2023-01-08 DIAGNOSIS — E1151 Type 2 diabetes mellitus with diabetic peripheral angiopathy without gangrene: Secondary | ICD-10-CM | POA: Diagnosis not present

## 2023-01-08 DIAGNOSIS — C4492 Squamous cell carcinoma of skin, unspecified: Secondary | ICD-10-CM | POA: Diagnosis not present

## 2023-01-14 DIAGNOSIS — E1151 Type 2 diabetes mellitus with diabetic peripheral angiopathy without gangrene: Secondary | ICD-10-CM | POA: Diagnosis not present

## 2023-01-14 DIAGNOSIS — K5792 Diverticulitis of intestine, part unspecified, without perforation or abscess without bleeding: Secondary | ICD-10-CM | POA: Diagnosis not present

## 2023-01-14 DIAGNOSIS — Z8546 Personal history of malignant neoplasm of prostate: Secondary | ICD-10-CM | POA: Diagnosis not present

## 2023-01-14 DIAGNOSIS — E1169 Type 2 diabetes mellitus with other specified complication: Secondary | ICD-10-CM | POA: Diagnosis not present

## 2023-01-14 DIAGNOSIS — L97922 Non-pressure chronic ulcer of unspecified part of left lower leg with fat layer exposed: Secondary | ICD-10-CM | POA: Diagnosis not present

## 2023-01-14 DIAGNOSIS — S88112S Complete traumatic amputation at level between knee and ankle, left lower leg, sequela: Secondary | ICD-10-CM | POA: Diagnosis not present

## 2023-01-14 DIAGNOSIS — E11622 Type 2 diabetes mellitus with other skin ulcer: Secondary | ICD-10-CM | POA: Diagnosis not present

## 2023-01-14 DIAGNOSIS — I4891 Unspecified atrial fibrillation: Secondary | ICD-10-CM | POA: Diagnosis not present

## 2023-01-14 DIAGNOSIS — S88111S Complete traumatic amputation at level between knee and ankle, right lower leg, sequela: Secondary | ICD-10-CM | POA: Diagnosis not present

## 2023-01-14 DIAGNOSIS — L97822 Non-pressure chronic ulcer of other part of left lower leg with fat layer exposed: Secondary | ICD-10-CM | POA: Diagnosis not present

## 2023-01-17 DIAGNOSIS — E1151 Type 2 diabetes mellitus with diabetic peripheral angiopathy without gangrene: Secondary | ICD-10-CM | POA: Diagnosis not present

## 2023-01-17 DIAGNOSIS — D469 Myelodysplastic syndrome, unspecified: Secondary | ICD-10-CM | POA: Diagnosis not present

## 2023-01-17 DIAGNOSIS — C7989 Secondary malignant neoplasm of other specified sites: Secondary | ICD-10-CM | POA: Diagnosis not present

## 2023-01-17 DIAGNOSIS — E1142 Type 2 diabetes mellitus with diabetic polyneuropathy: Secondary | ICD-10-CM | POA: Diagnosis not present

## 2023-01-17 DIAGNOSIS — C4492 Squamous cell carcinoma of skin, unspecified: Secondary | ICD-10-CM | POA: Diagnosis not present

## 2023-01-17 DIAGNOSIS — I4819 Other persistent atrial fibrillation: Secondary | ICD-10-CM | POA: Diagnosis not present

## 2023-01-20 DIAGNOSIS — Z8589 Personal history of malignant neoplasm of other organs and systems: Secondary | ICD-10-CM | POA: Diagnosis not present

## 2023-01-20 DIAGNOSIS — D500 Iron deficiency anemia secondary to blood loss (chronic): Secondary | ICD-10-CM | POA: Diagnosis not present

## 2023-01-20 DIAGNOSIS — I639 Cerebral infarction, unspecified: Secondary | ICD-10-CM | POA: Diagnosis not present

## 2023-01-20 DIAGNOSIS — I10 Essential (primary) hypertension: Secondary | ICD-10-CM | POA: Diagnosis not present

## 2023-01-20 DIAGNOSIS — I4891 Unspecified atrial fibrillation: Secondary | ICD-10-CM | POA: Diagnosis not present

## 2023-01-20 DIAGNOSIS — Z23 Encounter for immunization: Secondary | ICD-10-CM | POA: Diagnosis not present

## 2023-01-20 DIAGNOSIS — I739 Peripheral vascular disease, unspecified: Secondary | ICD-10-CM | POA: Diagnosis not present

## 2023-01-20 DIAGNOSIS — D472 Monoclonal gammopathy: Secondary | ICD-10-CM | POA: Diagnosis not present

## 2023-01-20 DIAGNOSIS — M1120 Other chondrocalcinosis, unspecified site: Secondary | ICD-10-CM | POA: Diagnosis not present

## 2023-01-20 DIAGNOSIS — D469 Myelodysplastic syndrome, unspecified: Secondary | ICD-10-CM | POA: Diagnosis not present

## 2023-01-20 DIAGNOSIS — Z6822 Body mass index (BMI) 22.0-22.9, adult: Secondary | ICD-10-CM | POA: Diagnosis not present

## 2023-01-20 DIAGNOSIS — K469 Unspecified abdominal hernia without obstruction or gangrene: Secondary | ICD-10-CM | POA: Diagnosis not present

## 2023-01-21 DIAGNOSIS — M818 Other osteoporosis without current pathological fracture: Secondary | ICD-10-CM | POA: Diagnosis not present

## 2023-01-21 DIAGNOSIS — D469 Myelodysplastic syndrome, unspecified: Secondary | ICD-10-CM | POA: Diagnosis not present

## 2023-01-21 DIAGNOSIS — T380X5A Adverse effect of glucocorticoids and synthetic analogues, initial encounter: Secondary | ICD-10-CM | POA: Diagnosis not present

## 2023-01-22 DIAGNOSIS — C7989 Secondary malignant neoplasm of other specified sites: Secondary | ICD-10-CM | POA: Diagnosis not present

## 2023-01-22 DIAGNOSIS — D469 Myelodysplastic syndrome, unspecified: Secondary | ICD-10-CM | POA: Diagnosis not present

## 2023-01-22 DIAGNOSIS — I4819 Other persistent atrial fibrillation: Secondary | ICD-10-CM | POA: Diagnosis not present

## 2023-01-22 DIAGNOSIS — C4492 Squamous cell carcinoma of skin, unspecified: Secondary | ICD-10-CM | POA: Diagnosis not present

## 2023-01-22 DIAGNOSIS — E1142 Type 2 diabetes mellitus with diabetic polyneuropathy: Secondary | ICD-10-CM | POA: Diagnosis not present

## 2023-01-22 DIAGNOSIS — E1151 Type 2 diabetes mellitus with diabetic peripheral angiopathy without gangrene: Secondary | ICD-10-CM | POA: Diagnosis not present

## 2023-01-29 DIAGNOSIS — E1142 Type 2 diabetes mellitus with diabetic polyneuropathy: Secondary | ICD-10-CM | POA: Diagnosis not present

## 2023-01-29 DIAGNOSIS — E1151 Type 2 diabetes mellitus with diabetic peripheral angiopathy without gangrene: Secondary | ICD-10-CM | POA: Diagnosis not present

## 2023-01-29 DIAGNOSIS — C4492 Squamous cell carcinoma of skin, unspecified: Secondary | ICD-10-CM | POA: Diagnosis not present

## 2023-01-29 DIAGNOSIS — C7989 Secondary malignant neoplasm of other specified sites: Secondary | ICD-10-CM | POA: Diagnosis not present

## 2023-01-29 DIAGNOSIS — I4819 Other persistent atrial fibrillation: Secondary | ICD-10-CM | POA: Diagnosis not present

## 2023-01-29 DIAGNOSIS — D469 Myelodysplastic syndrome, unspecified: Secondary | ICD-10-CM | POA: Diagnosis not present

## 2023-01-30 DIAGNOSIS — Y93B9 Activity, other involving muscle strengthening exercises: Secondary | ICD-10-CM | POA: Diagnosis not present

## 2023-01-30 DIAGNOSIS — M6281 Muscle weakness (generalized): Secondary | ICD-10-CM | POA: Diagnosis not present

## 2023-01-31 DIAGNOSIS — S8992XA Unspecified injury of left lower leg, initial encounter: Secondary | ICD-10-CM | POA: Diagnosis not present

## 2023-02-01 DIAGNOSIS — C7989 Secondary malignant neoplasm of other specified sites: Secondary | ICD-10-CM | POA: Diagnosis not present

## 2023-02-01 DIAGNOSIS — Z89512 Acquired absence of left leg below knee: Secondary | ICD-10-CM | POA: Diagnosis not present

## 2023-02-01 DIAGNOSIS — I5020 Unspecified systolic (congestive) heart failure: Secondary | ICD-10-CM | POA: Diagnosis not present

## 2023-02-01 DIAGNOSIS — E1142 Type 2 diabetes mellitus with diabetic polyneuropathy: Secondary | ICD-10-CM | POA: Diagnosis not present

## 2023-02-01 DIAGNOSIS — Z87891 Personal history of nicotine dependence: Secondary | ICD-10-CM | POA: Diagnosis not present

## 2023-02-01 DIAGNOSIS — Z7901 Long term (current) use of anticoagulants: Secondary | ICD-10-CM | POA: Diagnosis not present

## 2023-02-01 DIAGNOSIS — E11622 Type 2 diabetes mellitus with other skin ulcer: Secondary | ICD-10-CM | POA: Diagnosis not present

## 2023-02-01 DIAGNOSIS — Z8673 Personal history of transient ischemic attack (TIA), and cerebral infarction without residual deficits: Secondary | ICD-10-CM | POA: Diagnosis not present

## 2023-02-01 DIAGNOSIS — D630 Anemia in neoplastic disease: Secondary | ICD-10-CM | POA: Diagnosis not present

## 2023-02-01 DIAGNOSIS — I4819 Other persistent atrial fibrillation: Secondary | ICD-10-CM | POA: Diagnosis not present

## 2023-02-01 DIAGNOSIS — L97129 Non-pressure chronic ulcer of left thigh with unspecified severity: Secondary | ICD-10-CM | POA: Diagnosis not present

## 2023-02-01 DIAGNOSIS — Z8546 Personal history of malignant neoplasm of prostate: Secondary | ICD-10-CM | POA: Diagnosis not present

## 2023-02-01 DIAGNOSIS — E1151 Type 2 diabetes mellitus with diabetic peripheral angiopathy without gangrene: Secondary | ICD-10-CM | POA: Diagnosis not present

## 2023-02-01 DIAGNOSIS — C4492 Squamous cell carcinoma of skin, unspecified: Secondary | ICD-10-CM | POA: Diagnosis not present

## 2023-02-01 DIAGNOSIS — Z7982 Long term (current) use of aspirin: Secondary | ICD-10-CM | POA: Diagnosis not present

## 2023-02-01 DIAGNOSIS — M1711 Unilateral primary osteoarthritis, right knee: Secondary | ICD-10-CM | POA: Diagnosis not present

## 2023-02-01 DIAGNOSIS — D472 Monoclonal gammopathy: Secondary | ICD-10-CM | POA: Diagnosis not present

## 2023-02-01 DIAGNOSIS — Z7952 Long term (current) use of systemic steroids: Secondary | ICD-10-CM | POA: Diagnosis not present

## 2023-02-01 DIAGNOSIS — Z993 Dependence on wheelchair: Secondary | ICD-10-CM | POA: Diagnosis not present

## 2023-02-01 DIAGNOSIS — M5020 Other cervical disc displacement, unspecified cervical region: Secondary | ICD-10-CM | POA: Diagnosis not present

## 2023-02-01 DIAGNOSIS — D469 Myelodysplastic syndrome, unspecified: Secondary | ICD-10-CM | POA: Diagnosis not present

## 2023-02-01 DIAGNOSIS — Z7984 Long term (current) use of oral hypoglycemic drugs: Secondary | ICD-10-CM | POA: Diagnosis not present

## 2023-02-01 DIAGNOSIS — Z89511 Acquired absence of right leg below knee: Secondary | ICD-10-CM | POA: Diagnosis not present

## 2023-02-04 DIAGNOSIS — Y93B9 Activity, other involving muscle strengthening exercises: Secondary | ICD-10-CM | POA: Diagnosis not present

## 2023-02-04 DIAGNOSIS — M6281 Muscle weakness (generalized): Secondary | ICD-10-CM | POA: Diagnosis not present

## 2023-02-05 DIAGNOSIS — L570 Actinic keratosis: Secondary | ICD-10-CM | POA: Diagnosis not present

## 2023-02-06 DIAGNOSIS — E1142 Type 2 diabetes mellitus with diabetic polyneuropathy: Secondary | ICD-10-CM | POA: Diagnosis not present

## 2023-02-06 DIAGNOSIS — E1151 Type 2 diabetes mellitus with diabetic peripheral angiopathy without gangrene: Secondary | ICD-10-CM | POA: Diagnosis not present

## 2023-02-06 DIAGNOSIS — E11622 Type 2 diabetes mellitus with other skin ulcer: Secondary | ICD-10-CM | POA: Diagnosis not present

## 2023-02-06 DIAGNOSIS — M6281 Muscle weakness (generalized): Secondary | ICD-10-CM | POA: Diagnosis not present

## 2023-02-06 DIAGNOSIS — D469 Myelodysplastic syndrome, unspecified: Secondary | ICD-10-CM | POA: Diagnosis not present

## 2023-02-06 DIAGNOSIS — Y93B9 Activity, other involving muscle strengthening exercises: Secondary | ICD-10-CM | POA: Diagnosis not present

## 2023-02-06 DIAGNOSIS — L97129 Non-pressure chronic ulcer of left thigh with unspecified severity: Secondary | ICD-10-CM | POA: Diagnosis not present

## 2023-02-06 DIAGNOSIS — I4819 Other persistent atrial fibrillation: Secondary | ICD-10-CM | POA: Diagnosis not present

## 2023-02-10 DIAGNOSIS — D472 Monoclonal gammopathy: Secondary | ICD-10-CM | POA: Diagnosis not present

## 2023-02-10 DIAGNOSIS — D649 Anemia, unspecified: Secondary | ICD-10-CM | POA: Diagnosis not present

## 2023-02-10 DIAGNOSIS — C07 Malignant neoplasm of parotid gland: Secondary | ICD-10-CM | POA: Diagnosis not present

## 2023-02-10 DIAGNOSIS — E063 Autoimmune thyroiditis: Secondary | ICD-10-CM | POA: Diagnosis not present

## 2023-02-10 DIAGNOSIS — C44320 Squamous cell carcinoma of skin of unspecified parts of face: Secondary | ICD-10-CM | POA: Diagnosis not present

## 2023-02-10 DIAGNOSIS — D469 Myelodysplastic syndrome, unspecified: Secondary | ICD-10-CM | POA: Diagnosis not present

## 2023-02-10 DIAGNOSIS — Y93B9 Activity, other involving muscle strengthening exercises: Secondary | ICD-10-CM | POA: Diagnosis not present

## 2023-02-10 DIAGNOSIS — M6281 Muscle weakness (generalized): Secondary | ICD-10-CM | POA: Diagnosis not present

## 2023-02-11 DIAGNOSIS — M818 Other osteoporosis without current pathological fracture: Secondary | ICD-10-CM | POA: Diagnosis not present

## 2023-02-11 DIAGNOSIS — D469 Myelodysplastic syndrome, unspecified: Secondary | ICD-10-CM | POA: Diagnosis not present

## 2023-02-11 DIAGNOSIS — T380X5A Adverse effect of glucocorticoids and synthetic analogues, initial encounter: Secondary | ICD-10-CM | POA: Diagnosis not present

## 2023-02-12 DIAGNOSIS — L97129 Non-pressure chronic ulcer of left thigh with unspecified severity: Secondary | ICD-10-CM | POA: Diagnosis not present

## 2023-02-12 DIAGNOSIS — D469 Myelodysplastic syndrome, unspecified: Secondary | ICD-10-CM | POA: Diagnosis not present

## 2023-02-12 DIAGNOSIS — E1142 Type 2 diabetes mellitus with diabetic polyneuropathy: Secondary | ICD-10-CM | POA: Diagnosis not present

## 2023-02-12 DIAGNOSIS — E11622 Type 2 diabetes mellitus with other skin ulcer: Secondary | ICD-10-CM | POA: Diagnosis not present

## 2023-02-12 DIAGNOSIS — E1151 Type 2 diabetes mellitus with diabetic peripheral angiopathy without gangrene: Secondary | ICD-10-CM | POA: Diagnosis not present

## 2023-02-12 DIAGNOSIS — I4819 Other persistent atrial fibrillation: Secondary | ICD-10-CM | POA: Diagnosis not present

## 2023-02-15 ENCOUNTER — Other Ambulatory Visit: Payer: Self-pay

## 2023-02-15 ENCOUNTER — Emergency Department (HOSPITAL_COMMUNITY): Payer: Medicare Other

## 2023-02-15 ENCOUNTER — Encounter (HOSPITAL_COMMUNITY): Payer: Self-pay | Admitting: Internal Medicine

## 2023-02-15 ENCOUNTER — Inpatient Hospital Stay (HOSPITAL_COMMUNITY)
Admission: EM | Admit: 2023-02-15 | Discharge: 2023-02-24 | DRG: 314 | Disposition: A | Discharge status: Skilled Nursing Facility | Payer: Medicare Other | Attending: Family Medicine | Admitting: Family Medicine

## 2023-02-15 DIAGNOSIS — E8809 Other disorders of plasma-protein metabolism, not elsewhere classified: Secondary | ICD-10-CM | POA: Diagnosis present

## 2023-02-15 DIAGNOSIS — Z87891 Personal history of nicotine dependence: Secondary | ICD-10-CM

## 2023-02-15 DIAGNOSIS — I771 Stricture of artery: Secondary | ICD-10-CM | POA: Diagnosis not present

## 2023-02-15 DIAGNOSIS — E8721 Acute metabolic acidosis: Secondary | ICD-10-CM | POA: Diagnosis not present

## 2023-02-15 DIAGNOSIS — R7881 Bacteremia: Secondary | ICD-10-CM | POA: Insufficient documentation

## 2023-02-15 DIAGNOSIS — Z91041 Radiographic dye allergy status: Secondary | ICD-10-CM

## 2023-02-15 DIAGNOSIS — J849 Interstitial pulmonary disease, unspecified: Secondary | ICD-10-CM | POA: Diagnosis present

## 2023-02-15 DIAGNOSIS — Z86718 Personal history of other venous thrombosis and embolism: Secondary | ICD-10-CM

## 2023-02-15 DIAGNOSIS — K8020 Calculus of gallbladder without cholecystitis without obstruction: Secondary | ICD-10-CM | POA: Diagnosis not present

## 2023-02-15 DIAGNOSIS — Z7984 Long term (current) use of oral hypoglycemic drugs: Secondary | ICD-10-CM

## 2023-02-15 DIAGNOSIS — R6520 Severe sepsis without septic shock: Secondary | ICD-10-CM | POA: Diagnosis not present

## 2023-02-15 DIAGNOSIS — Z1152 Encounter for screening for COVID-19: Secondary | ICD-10-CM | POA: Diagnosis not present

## 2023-02-15 DIAGNOSIS — R0689 Other abnormalities of breathing: Secondary | ICD-10-CM | POA: Diagnosis not present

## 2023-02-15 DIAGNOSIS — T827XXA Infection and inflammatory reaction due to other cardiac and vascular devices, implants and grafts, initial encounter: Secondary | ICD-10-CM | POA: Diagnosis not present

## 2023-02-15 DIAGNOSIS — K819 Cholecystitis, unspecified: Secondary | ICD-10-CM | POA: Diagnosis not present

## 2023-02-15 DIAGNOSIS — B9561 Methicillin susceptible Staphylococcus aureus infection as the cause of diseases classified elsewhere: Secondary | ICD-10-CM | POA: Insufficient documentation

## 2023-02-15 DIAGNOSIS — Z7401 Bed confinement status: Secondary | ICD-10-CM | POA: Diagnosis not present

## 2023-02-15 DIAGNOSIS — I110 Hypertensive heart disease with heart failure: Secondary | ICD-10-CM | POA: Diagnosis not present

## 2023-02-15 DIAGNOSIS — Z89511 Acquired absence of right leg below knee: Secondary | ICD-10-CM | POA: Diagnosis not present

## 2023-02-15 DIAGNOSIS — M179 Osteoarthritis of knee, unspecified: Secondary | ICD-10-CM | POA: Diagnosis present

## 2023-02-15 DIAGNOSIS — I5020 Unspecified systolic (congestive) heart failure: Secondary | ICD-10-CM | POA: Diagnosis present

## 2023-02-15 DIAGNOSIS — N281 Cyst of kidney, acquired: Secondary | ICD-10-CM | POA: Diagnosis not present

## 2023-02-15 DIAGNOSIS — I472 Ventricular tachycardia, unspecified: Secondary | ICD-10-CM | POA: Diagnosis not present

## 2023-02-15 DIAGNOSIS — R627 Adult failure to thrive: Secondary | ICD-10-CM | POA: Diagnosis present

## 2023-02-15 DIAGNOSIS — R000 Tachycardia, unspecified: Secondary | ICD-10-CM | POA: Diagnosis not present

## 2023-02-15 DIAGNOSIS — B0221 Postherpetic geniculate ganglionitis: Secondary | ICD-10-CM | POA: Diagnosis present

## 2023-02-15 DIAGNOSIS — I739 Peripheral vascular disease, unspecified: Secondary | ICD-10-CM | POA: Diagnosis not present

## 2023-02-15 DIAGNOSIS — E1151 Type 2 diabetes mellitus with diabetic peripheral angiopathy without gangrene: Secondary | ICD-10-CM | POA: Diagnosis present

## 2023-02-15 DIAGNOSIS — M069 Rheumatoid arthritis, unspecified: Secondary | ICD-10-CM | POA: Diagnosis not present

## 2023-02-15 DIAGNOSIS — R601 Generalized edema: Secondary | ICD-10-CM | POA: Diagnosis not present

## 2023-02-15 DIAGNOSIS — R509 Fever, unspecified: Secondary | ICD-10-CM | POA: Diagnosis not present

## 2023-02-15 DIAGNOSIS — J99 Respiratory disorders in diseases classified elsewhere: Secondary | ICD-10-CM | POA: Diagnosis present

## 2023-02-15 DIAGNOSIS — R0989 Other specified symptoms and signs involving the circulatory and respiratory systems: Secondary | ICD-10-CM | POA: Diagnosis not present

## 2023-02-15 DIAGNOSIS — K828 Other specified diseases of gallbladder: Secondary | ICD-10-CM | POA: Diagnosis not present

## 2023-02-15 DIAGNOSIS — Z7901 Long term (current) use of anticoagulants: Secondary | ICD-10-CM

## 2023-02-15 DIAGNOSIS — Z820 Family history of epilepsy and other diseases of the nervous system: Secondary | ICD-10-CM

## 2023-02-15 DIAGNOSIS — Z8249 Family history of ischemic heart disease and other diseases of the circulatory system: Secondary | ICD-10-CM

## 2023-02-15 DIAGNOSIS — D6959 Other secondary thrombocytopenia: Secondary | ICD-10-CM | POA: Diagnosis present

## 2023-02-15 DIAGNOSIS — M6281 Muscle weakness (generalized): Secondary | ICD-10-CM | POA: Diagnosis present

## 2023-02-15 DIAGNOSIS — Z79899 Other long term (current) drug therapy: Secondary | ICD-10-CM

## 2023-02-15 DIAGNOSIS — M1990 Unspecified osteoarthritis, unspecified site: Secondary | ICD-10-CM | POA: Diagnosis present

## 2023-02-15 DIAGNOSIS — T80211A Bloodstream infection due to central venous catheter, initial encounter: Principal | ICD-10-CM | POA: Diagnosis present

## 2023-02-15 DIAGNOSIS — R1084 Generalized abdominal pain: Secondary | ICD-10-CM | POA: Diagnosis not present

## 2023-02-15 DIAGNOSIS — R918 Other nonspecific abnormal finding of lung field: Secondary | ICD-10-CM | POA: Diagnosis not present

## 2023-02-15 DIAGNOSIS — R41841 Cognitive communication deficit: Secondary | ICD-10-CM | POA: Diagnosis present

## 2023-02-15 DIAGNOSIS — K838 Other specified diseases of biliary tract: Secondary | ICD-10-CM | POA: Diagnosis not present

## 2023-02-15 DIAGNOSIS — I517 Cardiomegaly: Secondary | ICD-10-CM | POA: Diagnosis not present

## 2023-02-15 DIAGNOSIS — R278 Other lack of coordination: Secondary | ICD-10-CM | POA: Diagnosis present

## 2023-02-15 DIAGNOSIS — D469 Myelodysplastic syndrome, unspecified: Secondary | ICD-10-CM | POA: Diagnosis present

## 2023-02-15 DIAGNOSIS — E118 Type 2 diabetes mellitus with unspecified complications: Secondary | ICD-10-CM | POA: Diagnosis present

## 2023-02-15 DIAGNOSIS — I679 Cerebrovascular disease, unspecified: Secondary | ICD-10-CM | POA: Diagnosis present

## 2023-02-15 DIAGNOSIS — E1165 Type 2 diabetes mellitus with hyperglycemia: Secondary | ICD-10-CM | POA: Diagnosis present

## 2023-02-15 DIAGNOSIS — D649 Anemia, unspecified: Secondary | ICD-10-CM | POA: Diagnosis not present

## 2023-02-15 DIAGNOSIS — Z89611 Acquired absence of right leg above knee: Secondary | ICD-10-CM | POA: Diagnosis not present

## 2023-02-15 DIAGNOSIS — Z981 Arthrodesis status: Secondary | ICD-10-CM

## 2023-02-15 DIAGNOSIS — K5900 Constipation, unspecified: Secondary | ICD-10-CM | POA: Diagnosis present

## 2023-02-15 DIAGNOSIS — A4101 Sepsis due to Methicillin susceptible Staphylococcus aureus: Secondary | ICD-10-CM | POA: Diagnosis not present

## 2023-02-15 DIAGNOSIS — R17 Unspecified jaundice: Secondary | ICD-10-CM | POA: Diagnosis not present

## 2023-02-15 DIAGNOSIS — Z8673 Personal history of transient ischemic attack (TIA), and cerebral infarction without residual deficits: Secondary | ICD-10-CM

## 2023-02-15 DIAGNOSIS — Z9079 Acquired absence of other genital organ(s): Secondary | ICD-10-CM

## 2023-02-15 DIAGNOSIS — I4811 Longstanding persistent atrial fibrillation: Secondary | ICD-10-CM | POA: Diagnosis present

## 2023-02-15 DIAGNOSIS — K8000 Calculus of gallbladder with acute cholecystitis without obstruction: Secondary | ICD-10-CM | POA: Diagnosis not present

## 2023-02-15 DIAGNOSIS — Z8546 Personal history of malignant neoplasm of prostate: Secondary | ICD-10-CM

## 2023-02-15 DIAGNOSIS — I480 Paroxysmal atrial fibrillation: Secondary | ICD-10-CM | POA: Diagnosis present

## 2023-02-15 DIAGNOSIS — D539 Nutritional anemia, unspecified: Secondary | ICD-10-CM | POA: Diagnosis not present

## 2023-02-15 DIAGNOSIS — T8744 Infection of amputation stump, left lower extremity: Secondary | ICD-10-CM | POA: Diagnosis present

## 2023-02-15 DIAGNOSIS — Z96652 Presence of left artificial knee joint: Secondary | ICD-10-CM | POA: Diagnosis present

## 2023-02-15 DIAGNOSIS — R748 Abnormal levels of other serum enzymes: Secondary | ICD-10-CM | POA: Diagnosis not present

## 2023-02-15 DIAGNOSIS — A419 Sepsis, unspecified organism: Secondary | ICD-10-CM | POA: Diagnosis not present

## 2023-02-15 DIAGNOSIS — K2100 Gastro-esophageal reflux disease with esophagitis, without bleeding: Secondary | ICD-10-CM | POA: Diagnosis present

## 2023-02-15 DIAGNOSIS — D849 Immunodeficiency, unspecified: Secondary | ICD-10-CM | POA: Diagnosis present

## 2023-02-15 DIAGNOSIS — I5022 Chronic systolic (congestive) heart failure: Secondary | ICD-10-CM | POA: Diagnosis present

## 2023-02-15 DIAGNOSIS — K219 Gastro-esophageal reflux disease without esophagitis: Secondary | ICD-10-CM | POA: Diagnosis present

## 2023-02-15 DIAGNOSIS — D638 Anemia in other chronic diseases classified elsewhere: Secondary | ICD-10-CM | POA: Diagnosis not present

## 2023-02-15 DIAGNOSIS — Z7952 Long term (current) use of systemic steroids: Secondary | ICD-10-CM

## 2023-02-15 DIAGNOSIS — M1180 Other specified crystal arthropathies, unspecified site: Secondary | ICD-10-CM | POA: Diagnosis present

## 2023-02-15 DIAGNOSIS — I38 Endocarditis, valve unspecified: Secondary | ICD-10-CM | POA: Diagnosis not present

## 2023-02-15 DIAGNOSIS — K4090 Unilateral inguinal hernia, without obstruction or gangrene, not specified as recurrent: Secondary | ICD-10-CM | POA: Diagnosis present

## 2023-02-15 DIAGNOSIS — R531 Weakness: Secondary | ICD-10-CM | POA: Diagnosis not present

## 2023-02-15 DIAGNOSIS — D689 Coagulation defect, unspecified: Secondary | ICD-10-CM | POA: Diagnosis not present

## 2023-02-15 DIAGNOSIS — R791 Abnormal coagulation profile: Secondary | ICD-10-CM | POA: Diagnosis present

## 2023-02-15 DIAGNOSIS — J90 Pleural effusion, not elsewhere classified: Secondary | ICD-10-CM | POA: Diagnosis not present

## 2023-02-15 DIAGNOSIS — D696 Thrombocytopenia, unspecified: Secondary | ICD-10-CM | POA: Diagnosis not present

## 2023-02-15 HISTORY — DX: Other specified diabetes mellitus without complications: E13.9

## 2023-02-15 LAB — CBC WITH DIFFERENTIAL/PLATELET
Abs Immature Granulocytes: 0.22 10*3/uL — ABNORMAL HIGH (ref 0.00–0.07)
Basophils Absolute: 0 10*3/uL (ref 0.0–0.1)
Basophils Relative: 0 %
Eosinophils Absolute: 0 10*3/uL (ref 0.0–0.5)
Eosinophils Relative: 0 %
HCT: 24.6 % — ABNORMAL LOW (ref 39.0–52.0)
Hemoglobin: 7.4 g/dL — ABNORMAL LOW (ref 13.0–17.0)
Immature Granulocytes: 3 %
Lymphocytes Relative: 4 %
Lymphs Abs: 0.3 10*3/uL — ABNORMAL LOW (ref 0.7–4.0)
MCH: 41.3 pg — ABNORMAL HIGH (ref 26.0–34.0)
MCHC: 30.1 g/dL (ref 30.0–36.0)
MCV: 137.4 fL — ABNORMAL HIGH (ref 80.0–100.0)
Monocytes Absolute: 0.4 10*3/uL (ref 0.1–1.0)
Monocytes Relative: 4 %
Neutro Abs: 7.6 10*3/uL (ref 1.7–7.7)
Neutrophils Relative %: 89 %
Platelets: 103 10*3/uL — ABNORMAL LOW (ref 150–400)
RBC: 1.79 MIL/uL — ABNORMAL LOW (ref 4.22–5.81)
RDW: 20.6 % — ABNORMAL HIGH (ref 11.5–15.5)
WBC: 8.5 10*3/uL (ref 4.0–10.5)
nRBC: 1.6 % — ABNORMAL HIGH (ref 0.0–0.2)

## 2023-02-15 LAB — RESP PANEL BY RT-PCR (RSV, FLU A&B, COVID)  RVPGX2
Influenza A by PCR: NEGATIVE
Influenza B by PCR: NEGATIVE
Resp Syncytial Virus by PCR: NEGATIVE
SARS Coronavirus 2 by RT PCR: NEGATIVE

## 2023-02-15 LAB — RETICULOCYTES
Immature Retic Fract: 37.4 % — ABNORMAL HIGH (ref 2.3–15.9)
RBC.: 1.8 MIL/uL — ABNORMAL LOW (ref 4.22–5.81)
Retic Count, Absolute: 88.2 10*3/uL (ref 19.0–186.0)
Retic Ct Pct: 4.9 % — ABNORMAL HIGH (ref 0.4–3.1)

## 2023-02-15 LAB — COMPREHENSIVE METABOLIC PANEL
ALT: 182 U/L — ABNORMAL HIGH (ref 0–44)
AST: 116 U/L — ABNORMAL HIGH (ref 15–41)
Albumin: 3.2 g/dL — ABNORMAL LOW (ref 3.5–5.0)
Alkaline Phosphatase: 60 U/L (ref 38–126)
Anion gap: 16 — ABNORMAL HIGH (ref 5–15)
BUN: 23 mg/dL (ref 8–23)
CO2: 18 mmol/L — ABNORMAL LOW (ref 22–32)
Calcium: 8.6 mg/dL — ABNORMAL LOW (ref 8.9–10.3)
Chloride: 104 mmol/L (ref 98–111)
Creatinine, Ser: 1 mg/dL (ref 0.61–1.24)
GFR, Estimated: >60 mL/min (ref >60)
Glucose, Bld: 185 mg/dL — ABNORMAL HIGH (ref 70–99)
Potassium: 4.8 mmol/L (ref 3.5–5.1)
Sodium: 138 mmol/L (ref 135–145)
Total Bilirubin: 12.8 mg/dL — ABNORMAL HIGH (ref <1.2)
Total Protein: 6.1 g/dL — ABNORMAL LOW (ref 6.5–8.1)

## 2023-02-15 LAB — APTT
aPTT: 35 s (ref 24–36)
aPTT: 73 s — ABNORMAL HIGH (ref 24–36)

## 2023-02-15 LAB — MRSA NEXT GEN BY PCR, NASAL: MRSA by PCR Next Gen: NOT DETECTED

## 2023-02-15 LAB — PROTIME-INR
INR: 1.9 — ABNORMAL HIGH (ref 0.8–1.2)
Prothrombin Time: 22 s — ABNORMAL HIGH (ref 11.4–15.2)

## 2023-02-15 LAB — IRON AND TIBC
Iron: 74 ug/dL (ref 45–182)
Saturation Ratios: 34 % (ref 17.9–39.5)
TIBC: 216 ug/dL — ABNORMAL LOW (ref 250–450)
UIBC: 142 ug/dL

## 2023-02-15 LAB — LACTIC ACID, PLASMA
Lactic Acid, Venous: 8 mmol/L (ref 0.5–1.9)
Lactic Acid, Venous: 3.1 mmol/L (ref 0.5–1.9)

## 2023-02-15 LAB — FOLATE: Folate: 19.8 ng/mL (ref >5.9)

## 2023-02-15 LAB — VITAMIN B12: Vitamin B-12: 1212 pg/mL — ABNORMAL HIGH (ref 180–914)

## 2023-02-15 LAB — HEPARIN LEVEL (UNFRACTIONATED): Heparin Unfractionated: 0.65 [IU]/mL (ref 0.30–0.70)

## 2023-02-15 LAB — URIC ACID: Uric Acid, Serum: 5.1 mg/dL (ref 3.7–8.6)

## 2023-02-15 LAB — AMMONIA: Ammonia: 19 umol/L (ref 9–35)

## 2023-02-15 LAB — GLUCOSE, CAPILLARY
Glucose-Capillary: 119 mg/dL — ABNORMAL HIGH (ref 70–99)
Glucose-Capillary: 122 mg/dL — ABNORMAL HIGH (ref 70–99)

## 2023-02-15 LAB — PHOSPHORUS: Phosphorus: 4.7 mg/dL — ABNORMAL HIGH (ref 2.5–4.6)

## 2023-02-15 LAB — FERRITIN: Ferritin: 2166 ng/mL — ABNORMAL HIGH (ref 24–336)

## 2023-02-15 MED ORDER — POLYSACCHARIDE IRON COMPLEX 150 MG PO CAPS: 150.0000 mg | ORAL_CAPSULE | Freq: Every day | ORAL | Status: DC

## 2023-02-15 MED ORDER — VALACYCLOVIR HCL 500 MG PO TABS
500.0000 mg | ORAL_TABLET | Freq: Every day | ORAL | Status: DC
  Administered 2023-02-16 – 2023-02-24 (×8): 500 mg via ORAL
  Filled 2023-02-15 (×10): qty 1

## 2023-02-15 MED ORDER — HYDROMORPHONE HCL 1 MG/ML IJ SOLN
0.5000 mg | INTRAMUSCULAR | Status: DC | PRN
  Administered 2023-02-18 – 2023-02-19 (×2): 1 mg via INTRAVENOUS
  Filled 2023-02-15 (×2): qty 1

## 2023-02-15 MED ORDER — SODIUM CHLORIDE 0.9 % IV BOLUS
1000.0000 mL | Freq: Once | INTRAVENOUS | Status: AC
  Administered 2023-02-15: 1000 mL via INTRAVENOUS

## 2023-02-15 MED ORDER — HYDROXYCHLOROQUINE SULFATE 200 MG PO TABS
200.0000 mg | ORAL_TABLET | Freq: Every day | ORAL | Status: DC
  Administered 2023-02-16 – 2023-02-24 (×8): 200 mg via ORAL
  Filled 2023-02-15 (×8): qty 1

## 2023-02-15 MED ORDER — LACTATED RINGERS IV SOLN: INTRAVENOUS | Status: AC

## 2023-02-15 MED ORDER — VITAMIN C 500 MG PO TABS
500.0000 mg | ORAL_TABLET | Freq: Every day | ORAL | Status: DC
  Administered 2023-02-16 – 2023-02-24 (×8): 500 mg via ORAL
  Filled 2023-02-15 (×8): qty 1

## 2023-02-15 MED ORDER — HEPARIN BOLUS VIA INFUSION
1500.0000 [IU] | Freq: Once | INTRAVENOUS | Status: AC
  Administered 2023-02-15: 1500 [IU] via INTRAVENOUS

## 2023-02-15 MED ORDER — SODIUM CHLORIDE 1 G PO TABS
1.0000 g | ORAL_TABLET | Freq: Three times a day (TID) | ORAL | Status: DC
Start: 2023-02-15 — End: 2023-02-15

## 2023-02-15 MED ORDER — SODIUM CHLORIDE 0.9 % IV SOLN
2.0000 g | Freq: Once | INTRAVENOUS | Status: AC
  Administered 2023-02-15: 2 g via INTRAVENOUS
  Filled 2023-02-15: qty 12.5

## 2023-02-15 MED ORDER — SODIUM CHLORIDE 0.9 % IV SOLN
2.0000 g | Freq: Two times a day (BID) | INTRAVENOUS | Status: DC
  Administered 2023-02-15 – 2023-02-16 (×3): 2 g via INTRAVENOUS
  Filled 2023-02-15 (×3): qty 12.5

## 2023-02-15 MED ORDER — ACETAMINOPHEN 325 MG PO TABS: 650.0000 mg | ORAL_TABLET | Freq: Four times a day (QID) | ORAL | Status: DC | PRN

## 2023-02-15 MED ORDER — VANCOMYCIN HCL IN DEXTROSE 1-5 GM/200ML-% IV SOLN
1000.0000 mg | Freq: Once | INTRAVENOUS | Status: AC
  Administered 2023-02-15: 1000 mg via INTRAVENOUS
  Filled 2023-02-15: qty 200

## 2023-02-15 MED ORDER — PREDNISONE 5 MG PO TABS
15.0000 mg | ORAL_TABLET | Freq: Every day | ORAL | Status: DC
  Administered 2023-02-15 – 2023-02-24 (×9): 15 mg via ORAL
  Filled 2023-02-15 (×2): qty 1
  Filled 2023-02-15 (×2): qty 2
  Filled 2023-02-15 (×3): qty 1
  Filled 2023-02-15: qty 2
  Filled 2023-02-15: qty 1

## 2023-02-15 MED ORDER — ONDANSETRON HCL 4 MG/2ML IJ SOLN
4.0000 mg | Freq: Once | INTRAMUSCULAR | Status: AC
  Administered 2023-02-15: 4 mg via INTRAVENOUS
  Filled 2023-02-15: qty 2

## 2023-02-15 MED ORDER — METOPROLOL SUCCINATE ER 25 MG PO TB24
12.5000 mg | ORAL_TABLET | Freq: Two times a day (BID) | ORAL | Status: DC
  Administered 2023-02-15 – 2023-02-19 (×8): 12.5 mg via ORAL
  Filled 2023-02-15 (×8): qty 1

## 2023-02-15 MED ORDER — OXYCODONE HCL 5 MG PO CAPS: 5.0000 mg | ORAL_CAPSULE | ORAL | Status: DC | PRN

## 2023-02-15 MED ORDER — VANCOMYCIN HCL IN DEXTROSE 1-5 GM/200ML-% IV SOLN
1000.0000 mg | INTRAVENOUS | Status: DC
  Administered 2023-02-16: 1000 mg via INTRAVENOUS
  Filled 2023-02-15: qty 200

## 2023-02-15 MED ORDER — POLYETHYLENE GLYCOL 3350 17 G PO PACK
17.0000 g | PACK | Freq: Every day | ORAL | Status: DC | PRN
  Administered 2023-02-20: 17 g via ORAL
  Filled 2023-02-15: qty 1

## 2023-02-15 MED ORDER — PANTOPRAZOLE SODIUM 40 MG PO TBEC
40.0000 mg | DELAYED_RELEASE_TABLET | Freq: Every day | ORAL | Status: DC
  Administered 2023-02-15 – 2023-02-24 (×9): 40 mg via ORAL
  Filled 2023-02-15 (×9): qty 1

## 2023-02-15 MED ORDER — DICLOFENAC SODIUM 1 % EX GEL: 4.0000 g | Freq: Four times a day (QID) | CUTANEOUS | Status: DC | PRN

## 2023-02-15 MED ORDER — POTASSIUM CHLORIDE CRYS ER 20 MEQ PO TBCR
20.0000 meq | EXTENDED_RELEASE_TABLET | Freq: Every day | ORAL | Status: DC
  Administered 2023-02-16 – 2023-02-20 (×4): 20 meq via ORAL
  Filled 2023-02-15 (×4): qty 1

## 2023-02-15 MED ORDER — HEPARIN (PORCINE) 25000 UT/250ML-% IV SOLN
800.0000 [IU]/h | INTRAVENOUS | Status: DC
  Administered 2023-02-15: 800 [IU]/h via INTRAVENOUS
  Filled 2023-02-15: qty 250

## 2023-02-15 MED ORDER — ACETAMINOPHEN 325 MG PO TABS
650.0000 mg | ORAL_TABLET | Freq: Once | ORAL | Status: AC
  Administered 2023-02-15: 650 mg via ORAL
  Filled 2023-02-15: qty 2

## 2023-02-15 MED ORDER — METRONIDAZOLE 500 MG/100ML IV SOLN
500.0000 mg | Freq: Once | INTRAVENOUS | Status: AC
  Administered 2023-02-15: 500 mg via INTRAVENOUS
  Filled 2023-02-15: qty 100

## 2023-02-15 MED ORDER — PROSOURCE PLUS PO LIQD: 30.0000 mL | Freq: Two times a day (BID) | ORAL | Status: DC

## 2023-02-15 MED ORDER — ACETAMINOPHEN 650 MG RE SUPP: 650.0000 mg | Freq: Four times a day (QID) | RECTAL | Status: DC | PRN

## 2023-02-15 MED ORDER — ONDANSETRON HCL 4 MG PO TABS: 4.0000 mg | ORAL_TABLET | Freq: Four times a day (QID) | ORAL | Status: DC | PRN

## 2023-02-15 MED ORDER — ONDANSETRON HCL 4 MG/2ML IJ SOLN
4.0000 mg | Freq: Four times a day (QID) | INTRAMUSCULAR | Status: DC | PRN
  Administered 2023-02-15 – 2023-02-17 (×2): 4 mg via INTRAVENOUS
  Filled 2023-02-15 (×2): qty 2

## 2023-02-15 MED ORDER — PREGABALIN 50 MG PO CAPS
100.0000 mg | ORAL_CAPSULE | Freq: Three times a day (TID) | ORAL | Status: DC
  Administered 2023-02-15 – 2023-02-24 (×26): 100 mg via ORAL
  Filled 2023-02-15 (×23): qty 2
  Filled 2023-02-15: qty 4
  Filled 2023-02-15: qty 2
  Filled 2023-02-15: qty 4

## 2023-02-15 MED ORDER — CALCIUM CITRATE 950 (200 CA) MG PO TABS: 200.0000 mg | ORAL_TABLET | Freq: Every day | ORAL | Status: DC

## 2023-02-15 MED ORDER — VITAMIN D 25 MCG (1000 UNIT) PO TABS
1000.0000 [IU] | ORAL_TABLET | Freq: Every day | ORAL | Status: DC
  Administered 2023-02-16 – 2023-02-24 (×8): 1000 [IU] via ORAL
  Filled 2023-02-15 (×8): qty 1

## 2023-02-15 MED ORDER — DOCUSATE SODIUM 100 MG PO CAPS: 100.0000 mg | ORAL_CAPSULE | Freq: Two times a day (BID) | ORAL | Status: DC

## 2023-02-15 MED ORDER — METRONIDAZOLE 500 MG/100ML IV SOLN
500.0000 mg | Freq: Two times a day (BID) | INTRAVENOUS | Status: DC
  Administered 2023-02-16 – 2023-02-20 (×10): 500 mg via INTRAVENOUS
  Filled 2023-02-15 (×10): qty 100

## 2023-02-15 NOTE — ED Notes (Signed)
Pt in bed, pt has no complaints at this time, pt awaits transport.

## 2023-02-15 NOTE — Consult Note (Signed)
Pharmacy Antibiotic Note  ASSESSMENT: 82 y.o. male with PMH including known right-sided inguinal hernia is presenting with sepsis. Patient endorses generalized weakness, lower right-sided abdominal pain, increased urinary frequency, and fecal incontinence. CXR so far for imaging, unable to exclude pneumonia. Pharmacy has been consulted to manage cefepime and vancomycin dosing.  Patient measurements: Height: 5\' 8"  (172.7 cm) Weight: 60 kg (132 lb 4.4 oz) IBW/kg (Calculated) : 68.4  Vital signs: Temp: 101.4 F (38.6 C) (11/30 0650) Temp Source: Rectal (11/30 0650) BP: 133/69 (11/30 0645) Pulse Rate: 105 (11/30 0651) Recent Labs  Lab 02/15/23 0728  WBC 8.5  CREATININE 1.00   Estimated Creatinine Clearance: 50 mL/min (by C-G formula based on SCr of 1 mg/dL).  Allergies: Allergies  Allergen Reactions   Iodinated Contrast Media Rash    Can tolerate IV dye with steroid protocol    Antimicrobials this admission: Cefepime 11/30 >>  Vancomycin 11/30 >> Metronidazole 11/30 x 1  Dose adjustments this admission: N/A  Microbiology results: 11/30 BCx: sent 11/30 MRSA PCR: ordered  PLAN: Initiate cefepime 2 g IV q12H Initiate vancomycin 1000 mg IV q24H eAUC 504, Cmax 35, Cmin 12 Scr 1.0, TBW, Vd 0.72 Follow up culture results to assess for antibiotic optimization. Monitor renal function to assess for any necessary antibiotic dosing changes.   Thank you for allowing pharmacy to be a part of this patient's care.  Will M. Dareen Piano, PharmD Clinical Pharmacist 02/15/2023 8:47 AM

## 2023-02-15 NOTE — ED Triage Notes (Addendum)
Patient from home with complaints of generalized weakness and lower right sided abdominal pain for 6 months that's increased in severity last night. He attributes the pain to a known R sided inguinal hernia. Also complains of increased urinary and fecal incontinence. Jaundiced. C/o n/v/d.   Denies chest pain, shortness of breath. VSS per EMS.

## 2023-02-15 NOTE — H&P (Addendum)
History and Physical    David Joseph WGN:[id number] DOB: [birth year] DOA: 02/15/2023  PCP: Donetta Potts, MD   Patient coming from: Home  Chief Complaint: Weakness and fever  HPI: David Joseph is a 82 y.o. 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  He has had very poor appetite along with some nausea and 2 episodes of vomiting yesterday as well.  He states that he had a Reblozyl injection on 11/26 for his myelodysplastic syndrome and attributes his symptoms to this.  He has not taken any of his home medications since his symptoms began 2 days ago.  According to his wife at bedside, he appears more jaundiced than usual as well and she states that he is usually chronically jaundiced and has a bilirubin baseline near 4.  He is known to have intermittent abdominal pain over the last several months but he attributes this to his inguinal hernia.  He denies any overt bleeding.   ED Course: Vital signs with temperature elevation of 101.4 Fahrenheit on arrival and some tachycardia noted.  He appears to currently be in sinus rhythm.  Initial lactic acid of 8 which has improved to 3 after being given IV fluid.  Liver enzymes are elevated with AST 116 and ALT 182 and bilirubin 12.8.  Hemoglobin currently 7.4.  Flu and COVID testing negative.  He was started on multiple IV antibiotics.  CT of the abdomen demonstrating some possible acute diverticulitis and gallbladder with sludge and some gallbladder wall thickening noted along with small bilateral pleural effusions.  Ultrasound abdomen equivocal for acute cholecystitis and negative Murphy sign.  EDP discussed with general surgery who states that patient  should have HIDA scan and if positive, consider cholecystostomy drain both of which could only be performed at [facility] and are not available at [facility] over the weekend.  Review of Systems: Reviewed as noted above, otherwise negative.  Past Medical History:  Diagnosis Date   Anemia    Arthritis    OA   Calcium pyrophosphate deposition disease (CPPD)    Diverticulitis    10 years ago   DVT (deep venous thrombosis) (HCC) 04/09/2015   left leg- recently   Heart failure (HCC)    Pneumonia 13 years ago   Presence of internal carotid stent    Prostate CA (HCC)    Rheumatoid arthritis (HCC)     Past Surgical History:  Procedure Laterality Date   BACK SURGERY  03/18/1978   lower    basal cell areas removed  Sept 2013, 2012   left lower lip and chest   CERVICAL FUSION     COLONOSCOPY  10/17/2011   EYE SURGERY  2 years ago   both cataract   JOINT REPLACEMENT  March 2012, 02/2012   right knee, redo right knee   left middle finger surgery  05/17/2010   PROSTATECTOMY  12/16/2013   at [facility]   TONSILLECTOMY  60 years ago   TOTAL KNEE ARTHROPLASTY Left 10/09/2015   Procedure: LEFT TOTAL KNEE ARTHROPLASTY;  Surgeon: Ollen Gross, MD;  Location: [facility];  Service: Orthopedics;  Laterality: Left;   TOTAL KNEE REVISION  02/19/2012   Procedure: TOTAL KNEE REVISION;  Surgeon: Loanne Drilling, MD;  Location: [facility];  Service: Orthopedics;  Laterality: Right;     reports that he quit smoking about 11 years ago. His smoking use included cigars. He has been exposed to tobacco smoke. He has never used smokeless tobacco. He reports current alcohol use of about 7.0 standard drinks of alcohol per week. He reports that he does not use drugs.  Allergies  Allergen Reactions   Iodinated Contrast Media Rash    Can tolerate IV dye with steroid protocol    Family History  Problem Relation Age of Onset   Heart disease Mother    Anemia Father    Multiple sclerosis Brother     Prior to Admission  medications   Medication Sig Start Date End Date Taking? Authorizing Provider  acetaminophen (TYLENOL) 325 MG tablet Take 1-2 tablets (325-650 mg total) by mouth every 4 (four) hours as needed for mild pain. 04/08/22   Love, Evlyn Kanner, PA-C  apixaban (ELIQUIS) 5 MG TABS tablet Take 5 mg by mouth 2 (two) times daily.    [provider]  ascorbic acid (VITAMIN C) 500 MG tablet Take 1 tablet (500 mg total) by mouth daily. 04/09/22   Love, Evlyn Kanner, PA-C  atorvastatin (LIPITOR) 80 MG tablet Take 80 mg by mouth daily.    [provider]  calcium citrate (CALCITRATE - DOSED IN MG ELEMENTAL CALCIUM) 950 (200 Ca) MG tablet Take 1 tablet (200 mg of elemental calcium total) by mouth daily. 04/09/22   Love, Evlyn Kanner, PA-C  Cholecalciferol (VITAMIN D3) 1000 units CAPS Take 1 tablet by mouth daily.    [provider]  diclofenac Sodium (VOLTAREN) 1 % GEL Apply 4 g topically 4 (four) times daily as needed (L elbow pain). 04/08/22   Love, Evlyn Kanner, PA-C  docusate sodium (COLACE) 100 MG capsule Take 1 capsule (100 mg total) by mouth 2 (two) times daily. 04/08/22   Love, Evlyn Kanner, PA-C  hydroxychloroquine (PLAQUENIL) 200 MG tablet Take 200 mg by mouth daily.    [provider]  iron polysaccharides (NU-IRON) 150 MG capsule Take 1 capsule (150 mg total) by mouth daily. 04/08/22   Love, Evlyn Kanner, PA-C  Luspatercept-aamt (REBLOZYL [place]) Inject into the skin every 21 ( twenty-one) days.    [provider]  metFORMIN (GLUCOPHAGE) 500 MG tablet Take 1 tablet (500 mg total) by mouth daily with breakfast. 04/09/22   Love, Evlyn Kanner, PA-C  metoprolol succinate (TOPROL-XL) 25 MG 24 hr tablet Take 0.5 tablets (12.5 mg total) by mouth 2 (two) times daily. 04/08/22   Love, Evlyn Kanner, PA-C  Nutritional Supplements (,FEEDING SUPPLEMENT, PROSOURCE PLUS) liquid Take 30 mLs by mouth 2 (two) times daily with a meal. 04/08/22   Love, Evlyn Kanner, PA-C  oxycodone (OXY-IR) 5 MG capsule Take 5 mg by mouth every  4 (four) hours as needed for pain.    [provider]  pantoprazole (PROTONIX) 40 MG tablet Take 1 tablet (40 mg total) by mouth daily. 04/09/22   Love, Evlyn Kanner, PA-C  polyethylene glycol (MIRALAX / GLYCOLAX) 17 g packet Take 17 g by mouth daily as needed. 04/08/22   Love, Evlyn Kanner, PA-C  potassium chloride SA (KLOR-CON M) 20 MEQ tablet Take 1 tablet (20 mEq total) by mouth daily. 04/09/22   Love, Evlyn Kanner, PA-C  predniSONE (DELTASONE) 5 MG tablet Take 15 mg by mouth daily.    [provider]  pregabalin (LYRICA) 100 MG capsule Take 1 capsule (100 mg total) by mouth 3 (three) times daily. 04/08/22  Love, Evlyn Kanner, PA-C  sodium chloride 1 g tablet Take 1 tablet (1 g total) by mouth 3 (three) times daily with meals. 04/08/22   Love, Evlyn Kanner, PA-C  valACYclovir (VALTREX) 500 MG tablet Take 500 mg by mouth daily.    [provider]    Physical Exam: Vitals:   02/15/23 0945 02/15/23 1000 02/15/23 1015 02/15/23 1030  BP: (!) 108/56 (!) 106/52 (!) 106/53 (!) 103/52  Pulse: 90 90 85 81  Resp: 16 15 19 19   Temp:      TempSrc:      SpO2: 100% 100% 100% 100%  Weight:      Height:        Constitutional: NAD, calm, comfortable, jaundiced Vitals:   02/15/23 0945 02/15/23 1000 02/15/23 1015 02/15/23 1030  BP: (!) 108/56 (!) 106/52 (!) 106/53 (!) 103/52  Pulse: 90 90 85 81  Resp: 16 15 19 19   Temp:      TempSrc:      SpO2: 100% 100% 100% 100%  Weight:      Height:       Eyes: lids and conjunctivae normal Neck: normal, supple Respiratory: clear to auscultation bilaterally. Normal respiratory effort. No accessory muscle use.  On nasal cannula oxygen Cardiovascular: Regular rate and rhythm, no murmurs. Abdomen: no tenderness to right upper quadrant, no distention. Bowel sounds positive.  Right lower quadrant tender over inguinal hernia. Musculoskeletal: Lateral BKA Skin: no rashes, lesions, ulcers.  Psychiatric: Flat affect  Labs on Admission: I have personally  reviewed following labs and imaging studies  CBC: Recent Labs  Lab 02/15/23 0728  WBC 8.5  NEUTROABS 7.6  HGB 7.4*  HCT 24.6*  MCV 137.4*  PLT 103*   Basic Metabolic Panel: Recent Labs  Lab 02/15/23 0728  NA 138  K 4.8  CL 104  CO2 18*  GLUCOSE 185*  BUN 23  CREATININE 1.00  CALCIUM 8.6*  PHOS 4.7*   GFR: Estimated Creatinine Clearance: 50 mL/min (by C-G formula based on SCr of 1 mg/dL). Liver Function Tests: Recent Labs  Lab 02/15/23 0728  AST 116*  ALT 182*  ALKPHOS 60  BILITOT 12.8*  PROT 6.1*  ALBUMIN 3.2*   No results for input(s): "LIPASE", "AMYLASE" in the last 168 hours. Recent Labs  Lab 02/15/23 0728  AMMONIA 19   Coagulation Profile: Recent Labs  Lab 02/15/23 0728  INR 1.9*   Cardiac Enzymes: No results for input(s): "CKTOTAL", "CKMB", "CKMBINDEX", "TROPONINI" in the last 168 hours. BNP (last 3 results) No results for input(s): "PROBNP" in the last 8760 hours. HbA1C: No results for input(s): "HGBA1C" in the last 72 hours. CBG: No results for input(s): "GLUCAP" in the last 168 hours. Lipid Profile: No results for input(s): "CHOL", "HDL", "LDLCALC", "TRIG", "CHOLHDL", "LDLDIRECT" in the last 72 hours. Thyroid Function Tests: No results for input(s): "TSH", "T4TOTAL", "FREET4", "T3FREE", "THYROIDAB" in the last 72 hours. Anemia Panel: No results for input(s): "VITAMINB12", "FOLATE", "FERRITIN", "TIBC", "IRON", "RETICCTPCT" in the last 72 hours. Urine analysis:    Component Value Date/Time   COLORURINE YELLOW 09/26/2015 1025   APPEARANCEUR CLOUDY (A) 09/26/2015 1025   LABSPEC 1.014 09/26/2015 1025   PHURINE 6.0 09/26/2015 1025   GLUCOSEU NEGATIVE 09/26/2015 1025   HGBUR NEGATIVE 09/26/2015 1025   BILIRUBINUR NEGATIVE 09/26/2015 1025   KETONESUR NEGATIVE 09/26/2015 1025   PROTEINUR NEGATIVE 09/26/2015 1025   UROBILINOGEN 0.2 02/10/2012 1004   NITRITE NEGATIVE 09/26/2015 1025   LEUKOCYTESUR NEGATIVE 09/26/2015 1025    Radiological  Exams on Admission: US Abdomen Limited RUQ (LIVER/GB)  Result Date: 02/15/2023 CLINICAL DATA:  Fever.  Cholelithiasis. EXAM: ULTRASOUND ABDOMEN LIMITED RIGHT UPPER QUADRANT COMPARISON:  None Available. FINDINGS: Gallbladder: Gallbladder is completely filled with numerous echogenic, shadowing stones resulting in a wall-echo-shadow sign. The visualized gallbladder wall is mildly thickened. No sonographic Murphy sign noted by sonographer. Common bile duct: Diameter: 4 mm. Liver: No focal lesion identified. Within normal limits in parenchymal echogenicity. Portal vein is patent on color Doppler imaging with normal direction of blood flow towards the liver. Other: Right pleural effusion. IMPRESSION: 1. Cholelithiasis with mild gallbladder wall thickening. No sonographic Murphy sign noted by sonographer. Findings are equivocal for acute cholecystitis. A nuclear medicine HIDA scan could be obtained if further assessment is clinically warranted. 2. Right pleural effusion. Electronically Signed   By: Duanne Guess D.O.   On: 02/15/2023 10:26   CT CHEST ABDOMEN PELVIS WO CONTRAST  Result Date: 02/15/2023 CLINICAL DATA:  82 year old male with history of generalized weakness. Right lower abdominal pain for the past 6 months. History of prostate cancer. * Tracking Code: BO * EXAM: CT CHEST, ABDOMEN AND PELVIS WITHOUT CONTRAST TECHNIQUE: Multidetector CT imaging of the chest, abdomen and pelvis was performed following the standard protocol without IV contrast. RADIATION DOSE REDUCTION: This exam was performed according to the departmental dose-optimization program which includes automated exposure control, adjustment of the mA and/or kV according to patient size and/or use of iterative reconstruction technique. COMPARISON:  None Available. FINDINGS: CT CHEST FINDINGS Cardiovascular: Heart size is borderline enlarged. Trace amount of pericardial fluid and/or thickening, unlikely to be of any hemodynamic significance at  this time. No pericardial calcification. There is aortic atherosclerosis, as well as atherosclerosis of the great vessels of the mediastinum and the coronary arteries, including calcified atherosclerotic plaque in the left main, left anterior descending, left circumflex and right coronary arteries. Right internal jugular single-lumen Port-A-Cath with tip terminating in the distal superior vena cava. Extensive calcifications of the aortic valve. Mediastinum/Nodes: No pathologically enlarged mediastinal or hilar lymph nodes. Please note that accurate exclusion of hilar adenopathy is limited on noncontrast CT scans. Esophagus is unremarkable in appearance. No axillary lymphadenopathy. Lungs/Pleura: Small bilateral pleural effusions lying dependently (left-greater-than-right). No confluent consolidative airspace disease. No pleural effusions. Scattered areas of mild post infectious or inflammatory scarring, most evident in the inferior segment of the lingula. No definite suspicious appearing pulmonary nodules or masses are noted. Musculoskeletal: Orthopedic fixation hardware in the lower cervical spine incidentally noted. There are no aggressive appearing lytic or blastic lesions noted in the visualized portions of the skeleton. CT ABDOMEN PELVIS FINDINGS Hepatobiliary: Liver has a slightly shrunken appearance and nodular contour. No definite suspicious cystic or solid hepatic lesions are confidently identified on today's noncontrast CT examination. Gallbladder is only moderately distended but is filled with amorphous high attenuation material, presumably biliary sludge. Trace volume of pericholecystic fluid. Pancreas: No definite pancreatic mass or peripancreatic fluid collections or inflammatory changes are noted on today's noncontrast CT examination. Spleen: Unremarkable. Adrenals/Urinary Tract: 4.4 cm exophytic low-attenuation lesion in the medial aspect of the upper pole of the right kidney, incompletely  characterized on today's noncontrast CT examination, but statistically likely a cyst (no imaging follow-up recommended). Unenhanced appearance of the left kidney and bilateral adrenal glands is normal. No hydroureteronephrosis. Urinary bladder is normal in appearance. Stomach/Bowel: Unenhanced appearance of the stomach is normal. No pathologic dilatation of small bowel or colon. Subtle inflammatory changes are noted adjacent to the descending colon where  there are multiple diverticuli, concerning for possible acute diverticulitis. No discrete diverticular abscess or signs of frank perforation or confidently identified at this time. The appendix is not confidently identified and may be surgically absent. Regardless, there are no inflammatory changes noted adjacent to the cecum to suggest the presence of an acute appendicitis at this time. Vascular/Lymphatic: Atherosclerosis in the abdominal aorta and pelvic vasculature. No definite lymphadenopathy noted in the abdomen or pelvis. Reproductive: Status post radical prostatectomy. Other: No significant volume of ascites.  No pneumoperitoneum. Musculoskeletal: There are no aggressive appearing lytic or blastic lesions noted in the visualized portions of the skeleton. IMPRESSION: 1. Subtle inflammatory changes adjacent to the descending colon, which could indicate acute diverticulitis. No diverticular abscess or signs of frank perforation are noted at this time. 2. Gallbladder is moderately distended and filled with a large amount of biliary sludge. Trace volume of pericholecystic fluid noted. The possibility of acute cholecystitis should be considered. If there is any clinical concern for acute cholecystitis, further evaluation with right upper quadrant abdominal ultrasound would be recommended. 3. Small bilateral pleural effusions lying dependently. 4. Aortic atherosclerosis, in addition to left main and three-vessel coronary artery disease. 5. Trace amount of  pericardial fluid and/or thickening, unlikely to be of hemodynamic significance at this time. 6. There are calcifications of the aortic valve. Echocardiographic correlation for evaluation of potential valvular dysfunction may be warranted if clinically indicated. 7. Morphologic changes in the liver suggestive of underlying cirrhosis. 8. Additional incidental imaging findings, as above. Electronically Signed   By: Trudie Reed M.D.   On: 02/15/2023 09:13   DG Chest Port 1 View  Result Date: 02/15/2023 CLINICAL DATA:  Questionable sepsis. EXAM: PORTABLE CHEST 1 VIEW COMPARISON:  05/30/2010 FINDINGS: Cardiopericardial enlargement and aortic tortuosity. Diffuse interstitial prominence with congested appearance of central vessels and symmetric hazy density at the bases. No visible effusion or pneumothorax. Right port with tip at the SVC. IMPRESSION: Cardiomegaly and vascular congestion. Pneumonia cannot be excluded at the bases. Electronically Signed   By: Tiburcio Pea M.D.   On: 02/15/2023 07:56    EKG: Independently reviewed. ST 106bpm.  Assessment/Plan Principal Problem:   Cholecystitis Active Problems:   OA (osteoarthritis) of knee   Rheumatoid arthritis (HCC)   Chronic systolic congestive heart failure (HCC)   Anemia   S/P unilateral BKA (below knee amputation), right (HCC)    Hyperbilirubinemia and abdominal pain with possible cholecystitis -Need to transfer to [name] for HIDA scan per general surgery recommendations and possible need for cholecystostomy drain after that with IR -Continue to monitor on repeat labs -Keep n.p.o. for now until HIDA performed -Zofran and IV pain management for symptom control  Acute diverticulitis -As seen on CT and patient does have some abdominal pain -Continue IV antibiotics with cefepime, vancomycin, and Flagyl for now -Check MRSA PCR  Lactic acidosis -Possibly elevated in the setting of cirrhosis -Currently downtrending -Continue gentle  time-limited LR and follow labs in a.m.  Worsening anemia -No overt bleeding identified -Plan to check anemia panel -Monitor CBC  Type 2 diabetes with mild hyperglycemia -SSI and carb modified diet -Hold metformin and statin  Atrial fibrillation currently NSR -Heparin drip for now and avoid Eliquis in case procedure is needed -Continue telemetry monitoring  Myelodysplastic syndrome -Patient had Reblozyl injection 11/26, [facility]ology [facility]  Chronic HFrEF -Gentle with IV fluid for now and monitor carefully  Ramsay Hunt syndrome versus shingles -Continue Valtrex daily  CPPD/RA -On Plaquenil and prednisone  Bilateral BKA with history of PAD   DVT prophylaxis: Eliquis, hold for now and keep on heparin drip Code Status: Full Family Communication: Wife at bedside 11/30 Disposition Plan: Admit for evaluation of cholecystitis and treatment of diverticulitis Consults called: EDP spoke with general surgery Admission status: Inpatient, telemetry  Severity of Illness: The appropriate patient status for this patient is INPATIENT. Inpatient status is judged to be reasonable and necessary in order to provide the required intensity of service to ensure the patient's safety. The patient's presenting symptoms, physical exam findings, and initial radiographic and laboratory data in the context of their chronic comorbidities is felt to place them at high risk for further clinical deterioration. Furthermore, it is not anticipated that the patient will be medically stable for discharge from the hospital within 2 midnights of admission.   * I certify that at the point of admission it is my clinical judgment that the patient will require inpatient hospital care spanning beyond 2 midnights from the point of admission due to high intensity of service, high risk for further deterioration and high frequency of surveillance required.*   Dylin Ihnen D Chalice Philbert DO [facility]  If 7PM-7AM,  please contact night-coverage [email or web address]  02/15/2023, 11:57 AM

## 2023-02-15 NOTE — ED Notes (Signed)
CRITICAL VALUE STICKER  CRITICAL VALUE: lactic 8.0  DATE & TIME NOTIFIED: 11/30 0813  MESSENGER (representative from lab): Debbe Odea  MD NOTIFIED: Dr. Criss Alvine  TIME OF NOTIFICATION: [phone]

## 2023-02-15 NOTE — Progress Notes (Signed)
[facility]  Called by Dr. Gwenlyn Fudge regarding this patient.  He presents to the hospital with malaise for the last couple of days with associated cough, vomiting, diarrhea, and abdominal pain.  He complains of mostly lower abdominal pain, worse on the right.  He has an extensive past medical history significant for DVT, myelodysplastic syndrome, bilateral BKA's, CHF, peripheral vascular disease, and history of prostate cancer.  He does have a history of bilateral inguinal hernias, which are reducible on examination per ED provider.  In the ED, he underwent a CT of the chest abdomen and pelvis, which demonstrated possible acute diverticulitis and possible cholecystitis with gallbladder distention, sludge, and trace pericholecystic fluid.  He underwent an abdominal ultrasound which demonstrated cholelithiasis with mild wall thickening but negative Murphy sign, equivocal for acute cholecystitis.  He has no leukocytosis.  At baseline, he has an elevated total bilirubin up to 7 from his myelodysplastic syndrome.  On today's blood work, his total bilirubin is 12.8.  He also has mild elevation of AST and ALT with a normal alkaline phosphatase.  He had a lactic acid of 8 which has decreased to 3.1 after some fluid resuscitation.  Patient is also currently on Eliquis.  Given concern for possible acute cholecystitis, I recommend obtaining a HIDA scan to fully rule in or rule out acute cholecystitis.  Given his significant medical comorbidities and currently on Eliquis, if his HIDA scan is positive, I would recommend that he undergo percutaneous cholecystostomy tube placement with IR.  Discussed the case with hospitalist Dr. Sherryll Burger, and HIDA scan is not available at [facility] over the weekend.  So as not to delay his care, I feel it is appropriate for him to be transferred down to [facility] for HIDA scan.  He would then also be able to under any procedures with IR while at [facility].  Please call with any questions or  concerns.  Theophilus Kinds, DO [facility] [address] [place], [place] [address] [phone] (office)

## 2023-02-15 NOTE — Consult Note (Signed)
CODE SEPSIS - PHARMACY COMMUNICATION  **Broad-spectrum antimicrobials should be administered within one hour of sepsis diagnosis**  Time Code Sepsis call or page was received: 0658  Antibiotics ordered: Cefepime, Vancomycin, Metronidazole  Time of first antibiotic administration: 0744  Additional action taken by pharmacy: Messaged RN with goal time for antibiotic initiation  If necessary, name of provider/nurse contacted: Feliz Beam, RN    Will M. Dareen Piano, PharmD Clinical Pharmacist 02/15/2023 8:44 AM

## 2023-02-15 NOTE — ED Provider Notes (Signed)
[place] EMERGENCY DEPARTMENT AT [facility] Provider Note   CSN: [id number] Arrival date & time: 02/15/23  0636     History  Chief Complaint  Patient presents with   Weakness    David Joseph is a 82 y.o. male.  HPI 82 year old male presents with generalized weakness.  He has multiple comorbidities including but not limited to DVT, meylodysplastic syndrome, bilateral BKA's, CHF, PVD.  He has not been feeling well for multiple days.  He has had cough, vomiting, diarrhea and abdominal pain.  He has chronic inguinal hernias, right greater than left and states they are painful today.  He denies any back pain, headache, chest pain.  Was found to be febrile in the emergency department.  He is jaundiced which he states is chronic though he is not 100% sure why.  After the patient arrived she was able to provide a little more history.  Patient had a Reblozyl injection on 11/26.  Started feeling bad the next day which sometimes happens and can give him flulike side effects.  Still feeling weak and has been having a fever since yesterday.  Was up to 101 at home.  Has also had some abdominal pain and weakness and this morning his wife found him in the bed with stool and urine covering him. He always has a degree of confusion, is not worse currently.  Home Medications Prior to Admission medications   Medication Sig Start Date End Date Taking? Authorizing Provider  apixaban (ELIQUIS) 5 MG TABS tablet Take 5 mg by mouth 2 (two) times daily.   Yes [provider]  acetaminophen (TYLENOL) 325 MG tablet Take 1-2 tablets (325-650 mg total) by mouth every 4 (four) hours as needed for mild pain. 04/08/22   Love, Evlyn Kanner, PA-C  ascorbic acid (VITAMIN C) 500 MG tablet Take 1 tablet (500 mg total) by mouth daily. 04/09/22   Love, Evlyn Kanner, PA-C  atorvastatin (LIPITOR) 80 MG tablet Take 80 mg by mouth daily.    [provider]  calcium citrate (CALCITRATE - DOSED IN MG ELEMENTAL  CALCIUM) 950 (200 Ca) MG tablet Take 1 tablet (200 mg of elemental calcium total) by mouth daily. 04/09/22   Love, Evlyn Kanner, PA-C  Cholecalciferol (VITAMIN D3) 1000 units CAPS Take 1 tablet by mouth daily.    [provider]  diclofenac Sodium (VOLTAREN) 1 % GEL Apply 4 g topically 4 (four) times daily as needed (L elbow pain). 04/08/22   Love, Evlyn Kanner, PA-C  docusate sodium (COLACE) 100 MG capsule Take 1 capsule (100 mg total) by mouth 2 (two) times daily. 04/08/22   Love, Evlyn Kanner, PA-C  hydroxychloroquine (PLAQUENIL) 200 MG tablet Take 200 mg by mouth daily.    [provider]  iron polysaccharides (NU-IRON) 150 MG capsule Take 1 capsule (150 mg total) by mouth daily. 04/08/22   Love, Evlyn Kanner, PA-C  Luspatercept-aamt (REBLOZYL [place]) Inject into the skin every 21 ( twenty-one) days.    [provider]  metFORMIN (GLUCOPHAGE) 500 MG tablet Take 1 tablet (500 mg total) by mouth daily with breakfast. 04/09/22   Love, Evlyn Kanner, PA-C  metoprolol succinate (TOPROL-XL) 25 MG 24 hr tablet Take 0.5 tablets (12.5 mg total) by mouth 2 (two) times daily. 04/08/22   Love, Evlyn Kanner, PA-C  Nutritional Supplements (,FEEDING SUPPLEMENT, PROSOURCE PLUS) liquid Take 30 mLs by mouth 2 (two) times daily with a meal. 04/08/22   Love, Evlyn Kanner, PA-C  oxycodone (OXY-IR) 5 MG  capsule Take 5 mg by mouth every 4 (four) hours as needed for pain.    [provider]  pantoprazole (PROTONIX) 40 MG tablet Take 1 tablet (40 mg total) by mouth daily. 04/09/22   Love, Evlyn Kanner, PA-C  polyethylene glycol (MIRALAX / GLYCOLAX) 17 g packet Take 17 g by mouth daily as needed. 04/08/22   Love, Evlyn Kanner, PA-C  potassium chloride SA (KLOR-CON M) 20 MEQ tablet Take 1 tablet (20 mEq total) by mouth daily. 04/09/22   Love, Evlyn Kanner, PA-C  predniSONE (DELTASONE) 5 MG tablet Take 15 mg by mouth daily.    [provider]  pregabalin (LYRICA) 100 MG capsule Take 1 capsule (100 mg total) by mouth 3 (three) times  daily. 04/08/22   Love, Evlyn Kanner, PA-C  sodium chloride 1 g tablet Take 1 tablet (1 g total) by mouth 3 (three) times daily with meals. 04/08/22   Love, Evlyn Kanner, PA-C  valACYclovir (VALTREX) 500 MG tablet Take 500 mg by mouth daily.    [provider]      Allergies    Iodinated contrast media    Review of Systems   Review of Systems  Respiratory:  Positive for cough and shortness of breath.   Cardiovascular:  Negative for chest pain.  Gastrointestinal:  Positive for abdominal pain, diarrhea and vomiting.  Neurological:  Positive for weakness.    Physical Exam Updated Vital Signs BP (!) 106/57   Pulse 79   Temp 98.2 F (36.8 C) (Oral)   Resp 17   Ht 5\' 8"  (1.727 m)   Wt 60 kg   SpO2 100%   BMI 20.11 kg/m  Physical Exam Vitals and nursing note reviewed.  Constitutional:      Appearance: He is well-developed. He is not diaphoretic.  HENT:     Head: Normocephalic and atraumatic.  Cardiovascular:     Rate and Rhythm: Normal rate and regular rhythm.     Heart sounds: Normal heart sounds.  Pulmonary:     Effort: Pulmonary effort is normal.     Breath sounds: Normal breath sounds. No wheezing.  Abdominal:     Palpations: Abdomen is soft.     Tenderness: There is abdominal tenderness in the right lower quadrant.     Comments: There is tenderness over bilateral inguinal canals, but no focal swelling/obvious incarcerated hernia  Musculoskeletal:     Cervical back: No rigidity.  Skin:    General: Skin is warm and dry.     Coloration: Skin is jaundiced.  Neurological:     Mental Status: He is alert.     ED Results / Procedures / Treatments   Labs (all labs ordered are listed, but only abnormal results are displayed) Labs Reviewed  LACTIC ACID, PLASMA - Abnormal; Notable for the following components:      Result Value   Lactic Acid, Venous 8.0 (*)    All other components within normal limits  LACTIC ACID, PLASMA - Abnormal; Notable for the following  components:   Lactic Acid, Venous 3.1 (*)    All other components within normal limits  CBC WITH DIFFERENTIAL/PLATELET - Abnormal; Notable for the following components:   RBC 1.79 (*)    Hemoglobin 7.4 (*)    HCT 24.6 (*)    MCV 137.4 (*)    MCH 41.3 (*)    RDW 20.6 (*)    Platelets 103 (*)    nRBC 1.6 (*)    Lymphs Abs 0.3 (*)  Abs Immature Granulocytes 0.22 (*)    All other components within normal limits  COMPREHENSIVE METABOLIC PANEL - Abnormal; Notable for the following components:   CO2 18 (*)    Glucose, Bld 185 (*)    Calcium 8.6 (*)    Total Protein 6.1 (*)    Albumin 3.2 (*)    AST 116 (*)    ALT 182 (*)    Total Bilirubin 12.8 (*)    Anion gap 16 (*)    All other components within normal limits  PROTIME-INR - Abnormal; Notable for the following components:   Prothrombin Time 22.0 (*)    INR 1.9 (*)    All other components within normal limits  PHOSPHORUS - Abnormal; Notable for the following components:   Phosphorus 4.7 (*)    All other components within normal limits  RETICULOCYTES - Abnormal; Notable for the following components:   Retic Ct Pct 4.9 (*)    RBC. 1.80 (*)    Immature Retic Fract 37.4 (*)    All other components within normal limits  RESP PANEL BY RT-PCR (RSV, FLU A&B, COVID)  RVPGX2  CULTURE, BLOOD (ROUTINE X 2)  CULTURE, BLOOD (ROUTINE X 2)  GASTROINTESTINAL PANEL BY PCR, STOOL (REPLACES STOOL CULTURE)  C DIFFICILE QUICK SCREEN W PCR REFLEX    MRSA NEXT GEN BY PCR, NASAL  AMMONIA  APTT  URIC ACID  CBC WITH DIFFERENTIAL/PLATELET  URINALYSIS, W/ REFLEX TO CULTURE (INFECTION SUSPECTED)  VITAMIN B12  FOLATE  IRON AND TIBC  FERRITIN  APTT  HEPARIN LEVEL (UNFRACTIONATED)    EKG EKG Interpretation Date/Time:  Saturday February 15 2023 06:47:25 EST Ventricular Rate:  106 PR Interval:  187 QRS Duration:  122 QT Interval:  361 QTC Calculation: 480 R Axis:   -76  Text Interpretation: Sinus tachycardia Nonspecific IVCD with LAD  Inferior infarct, old Abnormal lateral Q waves Anterior infarct, old Confirmed by Pricilla Loveless [phone]) on 02/15/2023 7:22:31 AM  Radiology US Abdomen Limited RUQ (LIVER/GB)  Result Date: 02/15/2023 CLINICAL DATA:  Fever.  Cholelithiasis. EXAM: ULTRASOUND ABDOMEN LIMITED RIGHT UPPER QUADRANT COMPARISON:  None Available. FINDINGS: Gallbladder: Gallbladder is completely filled with numerous echogenic, shadowing stones resulting in a wall-echo-shadow sign. The visualized gallbladder wall is mildly thickened. No sonographic Murphy sign noted by sonographer. Common bile duct: Diameter: 4 mm. Liver: No focal lesion identified. Within normal limits in parenchymal echogenicity. Portal vein is patent on color Doppler imaging with normal direction of blood flow towards the liver. Other: Right pleural effusion. IMPRESSION: 1. Cholelithiasis with mild gallbladder wall thickening. No sonographic Murphy sign noted by sonographer. Findings are equivocal for acute cholecystitis. A nuclear medicine HIDA scan could be obtained if further assessment is clinically warranted. 2. Right pleural effusion. Electronically Signed   By: Duanne Guess D.O.   On: 02/15/2023 10:26   CT CHEST ABDOMEN PELVIS WO CONTRAST  Result Date: 02/15/2023 CLINICAL DATA:  82 year old male with history of generalized weakness. Right lower abdominal pain for the past 6 months. History of prostate cancer. * Tracking Code: BO * EXAM: CT CHEST, ABDOMEN AND PELVIS WITHOUT CONTRAST TECHNIQUE: Multidetector CT imaging of the chest, abdomen and pelvis was performed following the standard protocol without IV contrast. RADIATION DOSE REDUCTION: This exam was performed according to the departmental dose-optimization program which includes automated exposure control, adjustment of the mA and/or kV according to patient size and/or use of iterative reconstruction technique. COMPARISON:  None Available. FINDINGS: CT CHEST FINDINGS Cardiovascular: Heart size is  borderline enlarged.  Trace amount of pericardial fluid and/or thickening, unlikely to be of any hemodynamic significance at this time. No pericardial calcification. There is aortic atherosclerosis, as well as atherosclerosis of the great vessels of the mediastinum and the coronary arteries, including calcified atherosclerotic plaque in the left main, left anterior descending, left circumflex and right coronary arteries. Right internal jugular single-lumen Port-A-Cath with tip terminating in the distal superior vena cava. Extensive calcifications of the aortic valve. Mediastinum/Nodes: No pathologically enlarged mediastinal or hilar lymph nodes. Please note that accurate exclusion of hilar adenopathy is limited on noncontrast CT scans. Esophagus is unremarkable in appearance. No axillary lymphadenopathy. Lungs/Pleura: Small bilateral pleural effusions lying dependently (left-greater-than-right). No confluent consolidative airspace disease. No pleural effusions. Scattered areas of mild post infectious or inflammatory scarring, most evident in the inferior segment of the lingula. No definite suspicious appearing pulmonary nodules or masses are noted. Musculoskeletal: Orthopedic fixation hardware in the lower cervical spine incidentally noted. There are no aggressive appearing lytic or blastic lesions noted in the visualized portions of the skeleton. CT ABDOMEN PELVIS FINDINGS Hepatobiliary: Liver has a slightly shrunken appearance and nodular contour. No definite suspicious cystic or solid hepatic lesions are confidently identified on today's noncontrast CT examination. Gallbladder is only moderately distended but is filled with amorphous high attenuation material, presumably biliary sludge. Trace volume of pericholecystic fluid. Pancreas: No definite pancreatic mass or peripancreatic fluid collections or inflammatory changes are noted on today's noncontrast CT examination. Spleen: Unremarkable. Adrenals/Urinary Tract:  4.4 cm exophytic low-attenuation lesion in the medial aspect of the upper pole of the right kidney, incompletely characterized on today's noncontrast CT examination, but statistically likely a cyst (no imaging follow-up recommended). Unenhanced appearance of the left kidney and bilateral adrenal glands is normal. No hydroureteronephrosis. Urinary bladder is normal in appearance. Stomach/Bowel: Unenhanced appearance of the stomach is normal. No pathologic dilatation of small bowel or colon. Subtle inflammatory changes are noted adjacent to the descending colon where there are multiple diverticuli, concerning for possible acute diverticulitis. No discrete diverticular abscess or signs of frank perforation or confidently identified at this time. The appendix is not confidently identified and may be surgically absent. Regardless, there are no inflammatory changes noted adjacent to the cecum to suggest the presence of an acute appendicitis at this time. Vascular/Lymphatic: Atherosclerosis in the abdominal aorta and pelvic vasculature. No definite lymphadenopathy noted in the abdomen or pelvis. Reproductive: Status post radical prostatectomy. Other: No significant volume of ascites.  No pneumoperitoneum. Musculoskeletal: There are no aggressive appearing lytic or blastic lesions noted in the visualized portions of the skeleton. IMPRESSION: 1. Subtle inflammatory changes adjacent to the descending colon, which could indicate acute diverticulitis. No diverticular abscess or signs of frank perforation are noted at this time. 2. Gallbladder is moderately distended and filled with a large amount of biliary sludge. Trace volume of pericholecystic fluid noted. The possibility of acute cholecystitis should be considered. If there is any clinical concern for acute cholecystitis, further evaluation with right upper quadrant abdominal ultrasound would be recommended. 3. Small bilateral pleural effusions lying dependently. 4. Aortic  atherosclerosis, in addition to left main and three-vessel coronary artery disease. 5. Trace amount of pericardial fluid and/or thickening, unlikely to be of hemodynamic significance at this time. 6. There are calcifications of the aortic valve. Echocardiographic correlation for evaluation of potential valvular dysfunction may be warranted if clinically indicated. 7. Morphologic changes in the liver suggestive of underlying cirrhosis. 8. Additional incidental imaging findings, as above. Electronically Signed   By: Reuel Boom  Entrikin M.D.   On: 02/15/2023 09:13   DG Chest Port 1 View  Result Date: 02/15/2023 CLINICAL DATA:  Questionable sepsis. EXAM: PORTABLE CHEST 1 VIEW COMPARISON:  05/30/2010 FINDINGS: Cardiopericardial enlargement and aortic tortuosity. Diffuse interstitial prominence with congested appearance of central vessels and symmetric hazy density at the bases. No visible effusion or pneumothorax. Right port with tip at the SVC. IMPRESSION: Cardiomegaly and vascular congestion. Pneumonia cannot be excluded at the bases. Electronically Signed   By: Tiburcio Pea M.D.   On: 02/15/2023 07:56    Procedures .Critical Care  Performed by: Pricilla Loveless, MD Authorized by: Pricilla Loveless, MD   Critical care provider statement:    Critical care time (minutes):  40   Critical care time was exclusive of:  Separately billable procedures and treating other patients   Critical care was necessary to treat or prevent imminent or life-threatening deterioration of the following conditions:  Sepsis   Critical care was time spent personally by me on the following activities:  Development of treatment plan with patient or surrogate, discussions with consultants, evaluation of patient's response to treatment, examination of patient, ordering and review of laboratory studies, ordering and review of radiographic studies, ordering and performing treatments and interventions, pulse oximetry, re-evaluation of  patient's condition and review of old charts     Medications Ordered in ED Medications  ceFEPIme (MAXIPIME) 2 g in sodium chloride 0.9 % 100 mL IVPB (has no administration in time range)  vancomycin (VANCOCIN) IVPB 1000 mg/200 mL premix (has no administration in time range)  metroNIDAZOLE (FLAGYL) IVPB 500 mg (has no administration in time range)  oxycodone (OXY-IR) immediate release capsule 5 mg (has no administration in time range)  hydroxychloroquine (PLAQUENIL) tablet 200 mg (has no administration in time range)  valACYclovir (VALTREX) tablet 500 mg (has no administration in time range)  metoprolol succinate (TOPROL-XL) 24 hr tablet 12.5 mg (has no administration in time range)  predniSONE (DELTASONE) tablet 15 mg (has no administration in time range)  docusate sodium (COLACE) capsule 100 mg (has no administration in time range)  pantoprazole (PROTONIX) EC tablet 40 mg (has no administration in time range)  polyethylene glycol (MIRALAX / GLYCOLAX) packet 17 g (has no administration in time range)  iron polysaccharides (NIFEREX) capsule 150 mg (has no administration in time range)  pregabalin (LYRICA) capsule 100 mg (has no administration in time range)  ascorbic acid (VITAMIN C) tablet 500 mg (has no administration in time range)  Vitamin D3 CAPS 1 tablet (has no administration in time range)  calcium citrate (CALCITRATE - dosed in mg elemental calcium) tablet 200 mg of elemental calcium (has no administration in time range)  (feeding supplement) PROSource Plus liquid 30 mL (has no administration in time range)  sodium chloride tablet 1 g (has no administration in time range)  potassium chloride SA (KLOR-CON M) CR tablet 20 mEq (has no administration in time range)  diclofenac Sodium (VOLTAREN) 1 % topical gel 4 g (has no administration in time range)  lactated ringers infusion (has no administration in time range)  acetaminophen (TYLENOL) tablet 650 mg (has no administration in time  range)    Or  acetaminophen (TYLENOL) suppository 650 mg (has no administration in time range)  ondansetron (ZOFRAN) tablet 4 mg (has no administration in time range)    Or  ondansetron (ZOFRAN) injection 4 mg (has no administration in time range)  HYDROmorphone (DILAUDID) injection 0.5-1 mg (has no administration in time range)  heparin bolus via infusion 1,500  Units (has no administration in time range)  heparin ADULT infusion 100 units/mL (25000 units/268mL) (has no administration in time range)  ceFEPIme (MAXIPIME) 2 g in sodium chloride 0.9 % 100 mL IVPB (0 g Intravenous Stopped 02/15/23 0856)  metroNIDAZOLE (FLAGYL) IVPB 500 mg (0 mg Intravenous Stopped 02/15/23 0856)  vancomycin (VANCOCIN) IVPB 1000 mg/200 mL premix (0 mg Intravenous Stopped 02/15/23 0856)  ondansetron (ZOFRAN) injection 4 mg (4 mg Intravenous Given 02/15/23 0737)  acetaminophen (TYLENOL) tablet 650 mg (650 mg Oral Given 02/15/23 0734)  sodium chloride 0.9 % bolus 1,000 mL (0 mLs Intravenous Stopped 02/15/23 1152)    ED Course/ Medical Decision Making/ A&P                                 Medical Decision Making Amount and/or Complexity of Data Reviewed Labs: ordered.    Details: Lactate 8, came back down to 3.  Bilirubin up to 12. Radiology: ordered and independent interpretation performed.    Details: No bowel obstruction. ECG/medicine tests: ordered and independent interpretation performed.    Details: Sinus tachycardia  Risk OTC drugs. Prescription drug management. Decision regarding hospitalization.   Patient presents with fever.  Unclear source at this time.  Has abdominal pain but no evidence of an incarcerated hernia.  Questionable diverticulitis which may go along with some of the diarrhea he is having.  I have also ordered stool samples.  No obvious pneumonia or COVID based on respiratory panel and CT.  He is appearing better after being given fluids and Tylenol.  His lactate is downtrending.  I  suspect the significantly elevated lactate was at least partially related to what appears to be cirrhosis on the CT.  Possible gallbladder pathology, including his acute on chronic bilirubin elevation.  No clear cholecystitis on workup, HIDA scan recommended by radiology.  Discussed with Dr. Robyne Peers of general surgery who agrees hide it would probably be best but he is not a great surgical candidate anyway.  This may need to be performed in [place] as it is currently the weekend.  Discussed with Dr. Sherryll Burger for admission.        Final Clinical Impression(s) / ED Diagnoses Final diagnoses:  Severe sepsis [facility])    Rx / DC Orders ED Discharge Orders     None         Pricilla Loveless, MD 02/15/23 1245

## 2023-02-15 NOTE — Consult Note (Signed)
Pharmacy Consult Note - Anticoagulation  Pharmacy Consult for heparin Indication: atrial fibrillation  PATIENT MEASUREMENTS: Height: 5\' 8"  (172.7 cm) Weight: 60 kg (132 lb 4.4 oz) IBW/kg (Calculated) : 68.4 HEPARIN DW (KG): 60  VITAL SIGNS: Temp: 98.2 F (36.8 C) (11/30 1158) Temp Source: Oral (11/30 1158) BP: 106/57 (11/30 1158) Pulse Rate: 79 (11/30 1158)  Recent Labs    02/15/23 0728  HGB 7.4*  HCT 24.6*  PLT 103*  APTT 35  LABPROT 22.0*  INR 1.9*  CREATININE 1.00    Estimated Creatinine Clearance: 50 mL/min (by C-G formula based on SCr of 1 mg/dL).  PAST MEDICAL HISTORY: Past Medical History:  Diagnosis Date   Anemia    Arthritis    OA   Calcium pyrophosphate deposition disease (CPPD)    Diverticulitis    10 years ago   DVT (deep venous thrombosis) (HCC) 04/09/2015   left leg- recently   Heart failure (HCC)    Pneumonia 13 years ago   Presence of internal carotid stent    Prostate CA (HCC)    Rheumatoid arthritis [facility])     ASSESSMENT: 82 y.o. 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 Patient is on chronic anticoagulation with Eliquis. Last dose was 11/28 @ 0900. Pharmacy has been consulted to initiate and manage heparin intravenous infusion.  Pertinent medications: Apixaban 5 mg twice daily Last dose: 11/28 @ 0900  Goal(s) of therapy: Heparin level 0.3 - 0.7 units/mL aPTT 66 - 102 seconds Monitor platelets by anticoagulation protocol: Yes   Baseline anticoagulation labs: Recent Labs    02/15/23 0728  APTT 35  INR 1.9*  HGB 7.4*  PLT 103*    Date Time aPTT/HL Rate/Comment   PLAN: Give 1500 units bolus x1; then start heparin infusion at 800 units/hour. Check aPTT in 8 hours. Continue to titrate by aPTT until heparin level and aPTT  correlate and/or apixaban washes out, then titrate by heparin level alone. Check heparin level with next AM labs. Continue to monitor CBC daily while on heparin infusion.   Will M. Dareen Piano, PharmD Clinical Pharmacist 02/15/2023 12:33 PM

## 2023-02-15 NOTE — Sepsis Progress Note (Signed)
Sepsis protocol monitored by eLink ?

## 2023-02-15 NOTE — Consult Note (Signed)
Pharmacy Consult Note - Anticoagulation  Pharmacy Consult for heparin Indication: atrial fibrillation  PATIENT MEASUREMENTS: Height: 5\' 8"  (172.7 cm) Weight: 60 kg (132 lb 4.4 oz) IBW/kg (Calculated) : 68.4 HEPARIN DW (KG): 60  VITAL SIGNS: Temp: 99 F (37.2 C) (11/30 2106) Temp Source: Oral (11/30 2106) BP: 125/62 (11/30 2106) Pulse Rate: 95 (11/30 2106)  Recent Labs    02/15/23 0728  HGB 7.4*  HCT 24.6*  PLT 103*  APTT 35  LABPROT 22.0*  INR 1.9*  CREATININE 1.00    Estimated Creatinine Clearance: 50 mL/min (by C-G formula based on SCr of 1 mg/dL).  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 Patient is on chronic anticoagulation with Eliquis. Last dose was 11/28 @ 0900. Pharmacy has been consulted to initiate and manage heparin intravenous infusion.  Pertinent medications: Apixaban 5 mg twice daily Last dose: 11/28 @ 0900   11/30 PM update:  aPTT is therapeutic at 73 (lab not crossing into EPIC due to [employer] transfer-resulted on Pharmacy Power BI Report)  Goal(s) of therapy: Heparin level 0.3 - 0.7 units/mL aPTT 66 - 102 seconds Monitor platelets by anticoagulation protocol: Yes   PLAN: Cont heparin at 800 units/hr Heparin level and aPTT with AM labs  Abran Duke, PharmD, BCPS Clinical Pharmacist Phone: [phone]

## 2023-02-15 NOTE — ED Notes (Signed)
Date and time results received: 02/15/23 0928 (use smartphrase ".now" to insert current time)  Test: Lac Critical Value: 3.1  Name of Provider Notified: Criss Alvine

## 2023-02-15 NOTE — ED Notes (Signed)
Pt in bed resting, pt placed on 2L O2 via [place]

## 2023-02-16 DIAGNOSIS — D539 Nutritional anemia, unspecified: Secondary | ICD-10-CM | POA: Diagnosis not present

## 2023-02-16 DIAGNOSIS — D696 Thrombocytopenia, unspecified: Secondary | ICD-10-CM | POA: Diagnosis not present

## 2023-02-16 DIAGNOSIS — A419 Sepsis, unspecified organism: Secondary | ICD-10-CM | POA: Diagnosis not present

## 2023-02-16 DIAGNOSIS — K819 Cholecystitis, unspecified: Secondary | ICD-10-CM | POA: Diagnosis not present

## 2023-02-16 DIAGNOSIS — R6520 Severe sepsis without septic shock: Secondary | ICD-10-CM | POA: Diagnosis not present

## 2023-02-16 DIAGNOSIS — D689 Coagulation defect, unspecified: Secondary | ICD-10-CM

## 2023-02-16 LAB — BLOOD CULTURE ID PANEL (REFLEXED) - BCID2

## 2023-02-16 LAB — CBC
HCT: 19.6 % — ABNORMAL LOW (ref 39.0–52.0)
Hemoglobin: 6.2 g/dL — CL (ref 13.0–17.0)
MCH: 40.5 pg — ABNORMAL HIGH (ref 26.0–34.0)
MCHC: 31.6 g/dL (ref 30.0–36.0)
MCV: 128.1 fL — ABNORMAL HIGH (ref 80.0–100.0)
Platelets: 48 10*3/uL — ABNORMAL LOW (ref 150–400)
RBC: 1.53 MIL/uL — ABNORMAL LOW (ref 4.22–5.81)
RDW: 20.1 % — ABNORMAL HIGH (ref 11.5–15.5)
WBC: 5.4 10*3/uL (ref 4.0–10.5)
nRBC: 1.3 % — ABNORMAL HIGH (ref 0.0–0.2)

## 2023-02-16 LAB — COMPREHENSIVE METABOLIC PANEL
ALT: 143 U/L — ABNORMAL HIGH (ref 0–44)
AST: 68 U/L — ABNORMAL HIGH (ref 15–41)
Albumin: 2.4 g/dL — ABNORMAL LOW (ref 3.5–5.0)
Alkaline Phosphatase: 48 U/L (ref 38–126)
Anion gap: 9 (ref 5–15)
BUN: 20 mg/dL (ref 8–23)
CO2: 21 mmol/L — ABNORMAL LOW (ref 22–32)
Calcium: 7.8 mg/dL — ABNORMAL LOW (ref 8.9–10.3)
Chloride: 108 mmol/L (ref 98–111)
Creatinine, Ser: 1.16 mg/dL (ref 0.61–1.24)
GFR, Estimated: >60 mL/min (ref >60)
Glucose, Bld: 142 mg/dL — ABNORMAL HIGH (ref 70–99)
Potassium: 3.9 mmol/L (ref 3.5–5.1)
Sodium: 138 mmol/L (ref 135–145)
Total Bilirubin: 10 mg/dL — ABNORMAL HIGH (ref <1.2)
Total Protein: 4.9 g/dL — ABNORMAL LOW (ref 6.5–8.1)

## 2023-02-16 LAB — HEMOGLOBIN AND HEMATOCRIT, BLOOD
HCT: 23.2 % — ABNORMAL LOW (ref 39.0–52.0)
Hemoglobin: 7.4 g/dL — ABNORMAL LOW (ref 13.0–17.0)

## 2023-02-16 LAB — LACTATE DEHYDROGENASE: LDH: 234 U/L — ABNORMAL HIGH (ref 98–192)

## 2023-02-16 LAB — HEPATITIS C ANTIBODY: HCV Ab: NONREACTIVE

## 2023-02-16 LAB — LACTIC ACID, PLASMA: Lactic Acid, Venous: 1.8 mmol/L (ref 0.5–1.9)

## 2023-02-16 LAB — APTT: aPTT: 83 s — ABNORMAL HIGH (ref 24–36)

## 2023-02-16 LAB — MAGNESIUM: Magnesium: 2 mg/dL (ref 1.7–2.4)

## 2023-02-16 LAB — PREPARE RBC (CROSSMATCH)

## 2023-02-16 LAB — HEPARIN LEVEL (UNFRACTIONATED): Heparin Unfractionated: 0.53 [IU]/mL (ref 0.30–0.70)

## 2023-02-16 LAB — BILIRUBIN, DIRECT: Bilirubin, Direct: 3 mg/dL — ABNORMAL HIGH (ref 0.0–0.2)

## 2023-02-16 LAB — HEPATITIS A ANTIBODY, TOTAL: hep A Total Ab: NONREACTIVE

## 2023-02-16 MED ORDER — SODIUM CHLORIDE 0.9% IV SOLUTION: Freq: Once | INTRAVENOUS | Status: AC

## 2023-02-16 MED ORDER — SODIUM CHLORIDE 0.9% FLUSH: 10.0000 mL | INTRAVENOUS | Status: DC | PRN

## 2023-02-16 MED ORDER — LACTATED RINGERS IV SOLN: INTRAVENOUS | Status: AC

## 2023-02-16 MED ORDER — CHLORHEXIDINE GLUCONATE CLOTH 2 % EX PADS
6.0000 | MEDICATED_PAD | Freq: Every day | CUTANEOUS | Status: DC
  Administered 2023-02-16 – 2023-02-24 (×8): 6 via TOPICAL

## 2023-02-16 NOTE — Consult Note (Signed)
Reason for Consult: Weakness, abdominal pain Referring Physician: Hanley Ben MD  David Joseph is an 82 y.o. male.  HPI: 82 year old male transferred from [facility] after a 5-day history of overall weakness and lower abdominal pain.  He states his biggest complaint is weakness and failure to thrive.  No nausea or vomiting.  He was seen in any pin and found to have multiple findings.  There was some concern for cholecystitis based on ultrasound and CT scan.  There is also some potential concern for diverticulitis.  He also has signs of liver failure with bilirubin of 12 yesterday and 10 today.  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  He is getting active treatment.  Has multiple other medical problems including atrial fibrillation on anticoagulation rheumatoid arthritis congestive heart failure and chronic anemia.  Patient denies right upper quadrant pain or epigastric pain.  States he does have some left groin pain.  He does have a known right inguinal hernia that he pops in and out.  He does complain of occasional constipation.  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   Diverticulitis    10 years ago   DVT (deep venous thrombosis) (HCC) 04/09/2015   left leg- recently   Heart failure (HCC)    Pneumonia 13 years ago   Presence of internal carotid stent    Prostate CA (HCC)    Rheumatoid arthritis (HCC)     Past Surgical History:  Procedure Laterality Date   ATRIAL FIBRILLATION ABLATION  02/12/2022   BACK SURGERY  03/18/1978   lower    basal cell areas removed  Sept 2013, 2012   left lower lip and chest   CERVICAL FUSION     COLONOSCOPY  10/17/2011   EYE SURGERY  2 years ago   both cataract   JOINT REPLACEMENT  March 2012, 02/2012   right knee, redo right knee   left middle finger surgery   05/17/2010   PROSTATECTOMY  12/16/2013   at [facility]   TONSILLECTOMY  60 years ago   TOTAL KNEE ARTHROPLASTY Left 10/09/2015   Procedure: LEFT TOTAL KNEE ARTHROPLASTY;  Surgeon: Ollen Gross, MD;  Location: [facility];  Service: Orthopedics;  Laterality: Left;   TOTAL KNEE REVISION  02/19/2012   Procedure: TOTAL KNEE REVISION;  Surgeon: Loanne Drilling, MD;  Location: [facility];  Service: Orthopedics;  Laterality: Right;    Family History  Problem Relation Age of Onset   Heart disease Mother    Anemia Father    Multiple sclerosis Brother     Social History:  reports that he quit smoking about 11 years ago. His smoking use included cigars. He has been exposed to tobacco smoke. He has never used smokeless tobacco. He reports that he does not currently use alcohol. He reports that he does not use drugs.  Allergies:  Allergies  Allergen Reactions   Iodinated Contrast Media Rash    Can tolerate IV dye with steroid protocol    Medications: I have reviewed the patient's current medications.  Results for orders placed or performed during the hospital encounter of 02/15/23 (from the past 48 hour(s))  Blood Culture (routine x 2)     Status: None (Preliminary result)   Collection Time: 02/15/23  7:02 AM   Specimen:  BLOOD LEFT FOREARM  Result Value Ref Range   Specimen Description      BLOOD LEFT FOREARM BOTTLES DRAWN AEROBIC AND ANAEROBIC   Special Requests      Blood Culture results may not be optimal due to an inadequate volume of blood received in culture bottles   Culture  Setup Time      GRAM POSITIVE COCCI ANAEROBIC BOTTLE ONLY Gram Stain Report Called to,Read Back By and Verified With: S RICH AT 0752 ON [id number] BY S DALTON Performed at [facility], [address] [place], [place] [address]    Culture GRAM POSITIVE COCCI    Report Status PENDING   Resp panel by RT-PCR (RSV, Flu A&B, Covid) Anterior Nasal Swab     Status: None   Collection Time: 02/15/23  7:17 AM   Specimen:  Anterior Nasal Swab  Result Value Ref Range   SARS Coronavirus 2 by RT PCR NEGATIVE NEGATIVE    Comment: (NOTE) SARS-CoV-2 target nucleic acids are NOT DETECTED.  The SARS-CoV-2 RNA is generally detectable in upper respiratory specimens during the acute phase of infection. The lowest concentration of SARS-CoV-2 viral copies this assay can detect is 138 copies/mL. A negative result does not preclude SARS-Cov-2 infection and should not be used as the sole basis for treatment or other patient management decisions. A negative result may occur with  improper specimen collection/handling, submission of specimen other than nasopharyngeal swab, presence of viral mutation(s) within the areas targeted by this assay, and inadequate number of viral copies(<138 copies/mL). A negative result must be combined with clinical observations, patient history, and epidemiological information. The expected result is Negative.  Fact Sheet for Patients:  BloggerCourse.com  Fact Sheet for Healthcare Providers:  SeriousBroker.it  This test is no t yet approved or cleared by the [place] FDA and  has been authorized for detection and/or diagnosis of SARS-CoV-2 by FDA under an Emergency Use Authorization (EUA). This EUA will remain  in effect (meaning this test can be used) for the duration of the COVID-19 declaration under Section 564(b)(1) of the Act, 21 U.S.C.section 360bbb-3(b)(1), unless the authorization is terminated  or revoked sooner.       Influenza A by PCR NEGATIVE NEGATIVE   Influenza B by PCR NEGATIVE NEGATIVE    Comment: (NOTE) The Xpert Xpress SARS-CoV-2/FLU/RSV plus assay is intended as an aid in the diagnosis of influenza from Nasopharyngeal swab specimens and should not be used as a sole basis for treatment. Nasal washings and aspirates are unacceptable for Xpert Xpress SARS-CoV-2/FLU/RSV testing.  Fact Sheet for  Patients: BloggerCourse.com  Fact Sheet for Healthcare Providers: SeriousBroker.it  This test is not yet approved or cleared by the [place] FDA and has been authorized for detection and/or diagnosis of SARS-CoV-2 by FDA under an Emergency Use Authorization (EUA). This EUA will remain in effect (meaning this test can be used) for the duration of the COVID-19 declaration under Section 564(b)(1) of the Act, 21 U.S.C. section 360bbb-3(b)(1), unless the authorization is terminated or revoked.     Resp Syncytial Virus by PCR NEGATIVE NEGATIVE    Comment: (NOTE) Fact Sheet for Patients: BloggerCourse.com  Fact Sheet for Healthcare Providers: SeriousBroker.it  This test is not yet approved or cleared by the [place] FDA and has been authorized for detection and/or diagnosis of SARS-CoV-2 by FDA under an Emergency Use Authorization (EUA). This EUA will remain in effect (meaning this test can be used) for the duration of the COVID-19 declaration under Section 564(b)(1)  of the Act, 21 U.S.C. section 360bbb-3(b)(1), unless the authorization is terminated or revoked.  Performed at [facility], [address] [place], [place] [address]   Lactic acid, plasma     Status: Abnormal   Collection Time: 02/15/23  7:28 AM  Result Value Ref Range   Lactic Acid, Venous 8.0 (HH) 0.5 - 1.9 mmol/L    Comment: CRITICAL RESULT CALLED TO, READ BACK BY AND VERIFIED WITH ROGERS @ [phone] ON [id number] BY HEDERSON L Performed at [facility], [address], [place], [place] [address]   Blood Culture (routine x 2)     Status: None (Preliminary result)   Collection Time: 02/15/23  7:28 AM   Specimen: BLOOD RIGHT ARM  Result Value Ref Range   Specimen Description      BLOOD RIGHT ARM BOTTLES DRAWN AEROBIC AND ANAEROBIC   Special Requests Blood Culture adequate volume    Culture  Setup Time      GRAM  POSITIVE COCCI ANAEROBIC BOTTLE ONLY Gram Stain Report Called to,Read Back By and Verified With: S RICH AT 0752 ON [id number] BY S DALTON Performed at [facility], [address], [place], [place] [address]    Culture GRAM POSITIVE COCCI    Report Status PENDING   Ammonia     Status: None   Collection Time: 02/15/23  7:28 AM  Result Value Ref Range   Ammonia 19 9 - 35 umol/L    Comment: Performed at [facility], [address], [place], [place] [address]  CBC with Differential/Platelet     Status: Abnormal   Collection Time: 02/15/23  7:28 AM  Result Value Ref Range   WBC 8.5 4.0 - 10.5 K/uL   RBC 1.79 (L) 4.22 - 5.81 MIL/uL   Hemoglobin 7.4 (L) 13.0 - 17.0 g/dL   HCT 66.0 (L) 63.0 - 16.0 %   MCV 137.4 (H) 80.0 - 100.0 fL   MCH 41.3 (H) 26.0 - 34.0 pg   MCHC 30.1 30.0 - 36.0 g/dL   RDW 10.9 (H) 32.3 - 55.7 %   Platelets 103 (L) 150 - 400 K/uL    Comment: Immature Platelet Fraction may be clinically indicated, consider ordering this additional test [id number]    nRBC 1.6 (H) 0.0 - 0.2 %   Neutrophils Relative % 89 %   Neutro Abs 7.6 1.7 - 7.7 K/uL   Lymphocytes Relative 4 %   Lymphs Abs 0.3 (L) 0.7 - 4.0 K/uL   Monocytes Relative 4 %   Monocytes Absolute 0.4 0.1 - 1.0 K/uL   Eosinophils Relative 0 %   Eosinophils Absolute 0.0 0.0 - 0.5 K/uL   Basophils Relative 0 %   Basophils Absolute 0.0 0.0 - 0.1 K/uL   RBC Morphology MACROCYTOSIS    Smear Review FEW LARGE PLATELETS    Immature Granulocytes 3 %   Abs Immature Granulocytes 0.22 (H) 0.00 - 0.07 K/uL   Reactive, Benign Lymphocytes PRESENT    Polychromasia PRESENT    Ovalocytes PRESENT     Comment: Performed at [facility], [address] [place], [place] [address]  Comprehensive metabolic panel     Status: Abnormal   Collection Time: 02/15/23  7:28 AM  Result Value Ref Range   Sodium 138 135 - 145 mmol/L   Potassium 4.8 3.5 - 5.1 mmol/L   Chloride 104 98 - 111 mmol/L   CO2 18 (L) 22 - 32 mmol/L   Glucose, Bld 185  (H) 70 - 99 mg/dL    Comment:  Glucose reference range applies only to samples taken after fasting for at least 8 hours.   BUN 23 8 - 23 mg/dL   Creatinine, Ser 4.16 0.61 - 1.24 mg/dL   Calcium 8.6 (L) 8.9 - 10.3 mg/dL   Total Protein 6.1 (L) 6.5 - 8.1 g/dL   Albumin 3.2 (L) 3.5 - 5.0 g/dL   AST 606 (H) 15 - 41 U/L   ALT 182 (H) 0 - 44 U/L   Alkaline Phosphatase 60 38 - 126 U/L   Total Bilirubin 12.8 (H) <1.2 mg/dL   GFR, Estimated >30 >16 mL/min    Comment: (NOTE) Calculated using the CKD-EPI Creatinine Equation (2021)    Anion gap 16 (H) 5 - 15    Comment: Performed at [facility], [address] [place], [place] [address]  Protime-INR     Status: Abnormal   Collection Time: 02/15/23  7:28 AM  Result Value Ref Range   Prothrombin Time 22.0 (H) 11.4 - 15.2 seconds   INR 1.9 (H) 0.8 - 1.2    Comment: (NOTE) INR goal varies based on device and disease states. Performed at [facility], [address] [place], [place] [address]   APTT     Status: None   Collection Time: 02/15/23  7:28 AM  Result Value Ref Range   aPTT 35 24 - 36 seconds    Comment: Performed at [facility], [address], [place], [place] [address]  Uric acid     Status: None   Collection Time: 02/15/23  7:28 AM  Result Value Ref Range   Uric Acid, Serum 5.1 3.7 - 8.6 mg/dL    Comment: ICTERUS AT THIS LEVEL MAY AFFECT RESULT Performed at [facility], [address], [place], [place] [address]   Phosphorus     Status: Abnormal   Collection Time: 02/15/23  7:28 AM  Result Value Ref Range   Phosphorus 4.7 (H) 2.5 - 4.6 mg/dL    Comment: ICTERUS AT THIS LEVEL MAY AFFECT RESULT ICTERUS AT THIS LEVEL MAY AFFECT RESULT Performed at [facility], [address], [place], [place] [address]   Lactic acid, plasma     Status: Abnormal   Collection Time: 02/15/23  8:57 AM  Result Value Ref Range   Lactic Acid, Venous 3.1 (HH) 0.5 - 1.9 mmol/L    Comment: CRITICAL RESULT CALLED TO, READ BACK BY AND VERIFIED WITH  DEVOLT T @ 0927 ON [id number] BY HENDERSON L Performed at [facility], [address] [place], [place] [address]   MRSA Next Gen by PCR, Nasal     Status: None   Collection Time: 02/15/23 11:03 AM   Specimen: Nasal Mucosa; Nasal Swab  Result Value Ref Range   MRSA by PCR Next Gen NOT DETECTED NOT DETECTED    Comment: (NOTE) The GeneXpert MRSA Assay (FDA approved for NASAL specimens only), is one component of a comprehensive MRSA colonization surveillance program. It is not intended to diagnose MRSA infection nor to guide or monitor treatment for MRSA infections. Test performance is not FDA approved in patients less than 40 years old. Performed at [facility], [address] [place], [place] [address]   Vitamin B12     Status: Abnormal   Collection Time: 02/15/23 12:06 PM  Result Value Ref Range   Vitamin B-12 1,212 (H) 180 - 914 pg/mL    Comment: ICTERUS AT THIS LEVEL MAY AFFECT RESULT ICTERUS AT THIS LEVEL MAY AFFECT RESULT (NOTE) This assay is not validated for testing  neonatal or myeloproliferative syndrome specimens for Vitamin B12 levels. Performed at [facility], [address], [place], [place] [address]   Folate     Status: None   Collection Time: 02/15/23 12:06 PM  Result Value Ref Range   Folate 19.8 >5.9 ng/mL    Comment: ICTERUS AT THIS LEVEL MAY AFFECT RESULT Performed at [facility], [address], [place], [place] [address]   Iron and TIBC     Status: Abnormal   Collection Time: 02/15/23 12:06 PM  Result Value Ref Range   Iron 74 45 - 182 ug/dL   TIBC 098 (L) 119 - 147 ug/dL   Saturation Ratios 34 17.9 - 39.5 %   UIBC 142 ug/dL    Comment: Performed at [facility], [address] [place], [place] [address]  Ferritin     Status: Abnormal   Collection Time: 02/15/23 12:06 PM  Result Value Ref Range   Ferritin 2,166 (H) 24 - 336 ng/mL    Comment: ICTERUS AT THIS LEVEL MAY AFFECT RESULT RESULT CONFIRMED BY AUTOMATED DILUTION Performed at [facility],  [address] [place], [place] [address]   Reticulocytes     Status: Abnormal   Collection Time: 02/15/23 12:06 PM  Result Value Ref Range   Retic Ct Pct 4.9 (H) 0.4 - 3.1 %   RBC. 1.80 (L) 4.22 - 5.81 MIL/uL   Retic Count, Absolute 88.2 19.0 - 186.0 K/uL   Immature Retic Fract 37.4 (H) 2.3 - 15.9 %    Comment: Performed at [facility], [address], [place], [place] [address]  Glucose, capillary     Status: Abnormal   Collection Time: 02/15/23  1:15 PM  Result Value Ref Range   Glucose-Capillary 119 (H) 70 - 99 mg/dL    Comment: Glucose reference range applies only to samples taken after fasting for at least 8 hours.  Glucose, capillary     Status: Abnormal   Collection Time: 02/15/23  4:47 PM  Result Value Ref Range   Glucose-Capillary 122 (H) 70 - 99 mg/dL    Comment: Glucose reference range applies only to samples taken after fasting for at least 8 hours.  Magnesium     Status: None   Collection Time: 02/16/23  7:55 AM  Result Value Ref Range   Magnesium 2.0 1.7 - 2.4 mg/dL    Comment: Performed at [facility], [address] [place], [place] [address]  Comprehensive metabolic panel     Status: Abnormal   Collection Time: 02/16/23  7:55 AM  Result Value Ref Range   Sodium 138 135 - 145 mmol/L   Potassium 3.9 3.5 - 5.1 mmol/L   Chloride 108 98 - 111 mmol/L   CO2 21 (L) 22 - 32 mmol/L   Glucose, Bld 142 (H) 70 - 99 mg/dL    Comment: Glucose reference range applies only to samples taken after fasting for at least 8 hours.   BUN 20 8 - 23 mg/dL   Creatinine, Ser 5.28 0.61 - 1.24 mg/dL   Calcium 7.8 (L) 8.9 - 10.3 mg/dL   Total Protein 4.9 (L) 6.5 - 8.1 g/dL   Albumin 2.4 (L) 3.5 - 5.0 g/dL   AST 68 (H) 15 - 41 U/L   ALT 143 (H) 0 - 44 U/L   Alkaline Phosphatase 48 38 - 126 U/L   Total Bilirubin 10.0 (H) <1.2 mg/dL   GFR, Estimated >41 >32 mL/min    Comment: (NOTE) Calculated using the CKD-EPI Creatinine  Equation (2021)    Anion gap 9 5 - 15    Comment: Performed at  [facility], [address] [place], [place] [address]  CBC     Status: Abnormal   Collection Time: 02/16/23  7:55 AM  Result Value Ref Range   WBC 5.4 4.0 - 10.5 K/uL   RBC 1.53 (L) 4.22 - 5.81 MIL/uL   Hemoglobin 6.2 (LL) 13.0 - 17.0 g/dL    Comment: REPEATED TO VERIFY THIS CRITICAL RESULT HAS VERIFIED AND BEEN CALLED TO SAMANTHA RICH, RN BY [employer] ON 12 01 2024 AT 0818, AND HAS BEEN READ BACK.     HCT 19.6 (L) 39.0 - 52.0 %   MCV 128.1 (H) 80.0 - 100.0 fL   MCH 40.5 (H) 26.0 - 34.0 pg   MCHC 31.6 30.0 - 36.0 g/dL   RDW 57.8 (H) 46.9 - 62.9 %   Platelets 48 (L) 150 - 400 K/uL    Comment: SPECIMEN CHECKED FOR CLOTS Immature Platelet Fraction may be clinically indicated, consider ordering this additional test [id number] REPEATED TO VERIFY PLATELET COUNT CONFIRMED BY SMEAR    nRBC 1.3 (H) 0.0 - 0.2 %    Comment: Performed at [facility], [address], [place], [place] [address]  Lactic acid, plasma     Status: None   Collection Time: 02/16/23  7:55 AM  Result Value Ref Range   Lactic Acid, Venous 1.8 0.5 - 1.9 mmol/L    Comment: Performed at [facility], [address], [place], [place] [address]  APTT     Status: Abnormal   Collection Time: 02/16/23  7:55 AM  Result Value Ref Range   aPTT 83 (H) 24 - 36 seconds    Comment:        IF BASELINE aPTT IS ELEVATED, SUGGEST PATIENT RISK ASSESSMENT BE USED TO DETERMINE APPROPRIATE ANTICOAGULANT THERAPY. Performed at [facility], [address], [place], [place] [address]   Heparin level (unfractionated)     Status: None   Collection Time: 02/16/23  7:55 AM  Result Value Ref Range   Heparin Unfractionated 0.53 0.30 - 0.70 IU/mL    Comment: (NOTE) The clinical reportable range upper limit is being lowered to >1.10 to align with the FDA approved guidance for the current laboratory assay.  If heparin results are below expected values, and patient dosage has  been confirmed, suggest  follow up testing of antithrombin III levels. Performed at [facility], [address], [place], [place] [address]   Type and screen [facility]     Status: None (Preliminary result)   Collection Time: 02/16/23  9:23 AM  Result Value Ref Range   ABO/RH(D) PENDING    Antibody Screen PENDING    Sample Expiration      02/19/2023,2359 Performed at [facility], [address] [place], [place] [address]     US Abdomen Limited RUQ (LIVER/GB)  Result Date: 02/15/2023 CLINICAL DATA:  Fever.  Cholelithiasis. EXAM: ULTRASOUND ABDOMEN LIMITED RIGHT UPPER QUADRANT COMPARISON:  None Available. FINDINGS: Gallbladder: Gallbladder is completely filled with numerous echogenic, shadowing stones resulting in a wall-echo-shadow sign. The visualized gallbladder wall is mildly thickened. No sonographic Murphy sign noted by sonographer. Common bile duct: Diameter: 4 mm. Liver: No focal lesion identified. Within normal limits in parenchymal echogenicity. Portal vein is patent on color Doppler imaging with normal direction of blood flow towards the liver. Other: Right pleural effusion.  IMPRESSION: 1. Cholelithiasis with mild gallbladder wall thickening. No sonographic Murphy sign noted by sonographer. Findings are equivocal for acute cholecystitis. A nuclear medicine HIDA scan could be obtained if further assessment is clinically warranted. 2. Right pleural effusion. Electronically Signed   By: Duanne Guess D.O.   On: 02/15/2023 10:26   CT CHEST ABDOMEN PELVIS WO CONTRAST  Result Date: 02/15/2023 CLINICAL DATA:  82 year old male with history of generalized weakness. Right lower abdominal pain for the past 6 months. History of prostate cancer. * Tracking Code: BO * EXAM: CT CHEST, ABDOMEN AND PELVIS WITHOUT CONTRAST TECHNIQUE: Multidetector CT imaging of the chest, abdomen and pelvis was performed following the standard protocol without IV contrast. RADIATION DOSE REDUCTION: This exam  was performed according to the departmental dose-optimization program which includes automated exposure control, adjustment of the mA and/or kV according to patient size and/or use of iterative reconstruction technique. COMPARISON:  None Available. FINDINGS: CT CHEST FINDINGS Cardiovascular: Heart size is borderline enlarged. Trace amount of pericardial fluid and/or thickening, unlikely to be of any hemodynamic significance at this time. No pericardial calcification. There is aortic atherosclerosis, as well as atherosclerosis of the great vessels of the mediastinum and the coronary arteries, including calcified atherosclerotic plaque in the left main, left anterior descending, left circumflex and right coronary arteries. Right internal jugular single-lumen Port-A-Cath with tip terminating in the distal superior vena cava. Extensive calcifications of the aortic valve. Mediastinum/Nodes: No pathologically enlarged mediastinal or hilar lymph nodes. Please note that accurate exclusion of hilar adenopathy is limited on noncontrast CT scans. Esophagus is unremarkable in appearance. No axillary lymphadenopathy. Lungs/Pleura: Small bilateral pleural effusions lying dependently (left-greater-than-right). No confluent consolidative airspace disease. No pleural effusions. Scattered areas of mild post infectious or inflammatory scarring, most evident in the inferior segment of the lingula. No definite suspicious appearing pulmonary nodules or masses are noted. Musculoskeletal: Orthopedic fixation hardware in the lower cervical spine incidentally noted. There are no aggressive appearing lytic or blastic lesions noted in the visualized portions of the skeleton. CT ABDOMEN PELVIS FINDINGS Hepatobiliary: Liver has a slightly shrunken appearance and nodular contour. No definite suspicious cystic or solid hepatic lesions are confidently identified on today's noncontrast CT examination. Gallbladder is only moderately distended but is  filled with amorphous high attenuation material, presumably biliary sludge. Trace volume of pericholecystic fluid. Pancreas: No definite pancreatic mass or peripancreatic fluid collections or inflammatory changes are noted on today's noncontrast CT examination. Spleen: Unremarkable. Adrenals/Urinary Tract: 4.4 cm exophytic low-attenuation lesion in the medial aspect of the upper pole of the right kidney, incompletely characterized on today's noncontrast CT examination, but statistically likely a cyst (no imaging follow-up recommended). Unenhanced appearance of the left kidney and bilateral adrenal glands is normal. No hydroureteronephrosis. Urinary bladder is normal in appearance. Stomach/Bowel: Unenhanced appearance of the stomach is normal. No pathologic dilatation of small bowel or colon. Subtle inflammatory changes are noted adjacent to the descending colon where there are multiple diverticuli, concerning for possible acute diverticulitis. No discrete diverticular abscess or signs of frank perforation or confidently identified at this time. The appendix is not confidently identified and may be surgically absent. Regardless, there are no inflammatory changes noted adjacent to the cecum to suggest the presence of an acute appendicitis at this time. Vascular/Lymphatic: Atherosclerosis in the abdominal aorta and pelvic vasculature. No definite lymphadenopathy noted in the abdomen or pelvis. Reproductive: Status post radical prostatectomy. Other: No significant volume of ascites.  No pneumoperitoneum. Musculoskeletal: There are no aggressive appearing lytic or  blastic lesions noted in the visualized portions of the skeleton. IMPRESSION: 1. Subtle inflammatory changes adjacent to the descending colon, which could indicate acute diverticulitis. No diverticular abscess or signs of frank perforation are noted at this time. 2. Gallbladder is moderately distended and filled with a large amount of biliary sludge. Trace  volume of pericholecystic fluid noted. The possibility of acute cholecystitis should be considered. If there is any clinical concern for acute cholecystitis, further evaluation with right upper quadrant abdominal ultrasound would be recommended. 3. Small bilateral pleural effusions lying dependently. 4. Aortic atherosclerosis, in addition to left main and three-vessel coronary artery disease. 5. Trace amount of pericardial fluid and/or thickening, unlikely to be of hemodynamic significance at this time. 6. There are calcifications of the aortic valve. Echocardiographic correlation for evaluation of potential valvular dysfunction may be warranted if clinically indicated. 7. Morphologic changes in the liver suggestive of underlying cirrhosis. 8. Additional incidental imaging findings, as above. Electronically Signed   By: Trudie Reed M.D.   On: 02/15/2023 09:13   DG Chest Port 1 View  Result Date: 02/15/2023 CLINICAL DATA:  Questionable sepsis. EXAM: PORTABLE CHEST 1 VIEW COMPARISON:  05/30/2010 FINDINGS: Cardiopericardial enlargement and aortic tortuosity. Diffuse interstitial prominence with congested appearance of central vessels and symmetric hazy density at the bases. No visible effusion or pneumothorax. Right port with tip at the SVC. IMPRESSION: Cardiomegaly and vascular congestion. Pneumonia cannot be excluded at the bases. Electronically Signed   By: Tiburcio Pea M.D.   On: 02/15/2023 07:56    Review of Systems  Constitutional:  Positive for fatigue.  Gastrointestinal:  Positive for constipation.  Musculoskeletal:  Positive for arthralgias.   Blood pressure 113/61, pulse 72, temperature 98.1 F (36.7 C), temperature source Oral, resp. rate 17, height 5\' 8"  (1.727 m), weight 60 kg, SpO2 100%. Physical Exam Vitals and nursing note reviewed.  Constitutional:      Appearance: He is cachectic. He is not ill-appearing.  Eyes:     General: Scleral icterus present.  Cardiovascular:      Rate and Rhythm: Normal rate.  Pulmonary:     Effort: Pulmonary effort is normal.  Abdominal:     General: Abdomen is flat.     Palpations: Abdomen is soft.     Tenderness: There is no abdominal tenderness.     Hernia: A hernia is present. Hernia is present in the right inguinal area. There is no hernia in the left inguinal area.       Comments: Reducible RIH  Skin:    General: Skin is warm.  Neurological:     Mental Status: He is alert.     Assessment/Plan: 82 year old male transferred for further testing due to signs of potential cholecystitis and or diverticulitis.  He has a benign abdominal examination at this point in time.  His bilirubin is over 4 making HIDA scan less reliable and/or nonfunctional.  At this point time, he has no clinical signs of acute cholecystitis nor diverticulitis.  He does have a reducible right inguinal hernia which is reducible today.  Recommend GI consultation, heme-onc consultation at this point time.  No role for surgery.  Primary team will follow-up tomorrow.  Indie Boehne A Addyson Traub MD  02/16/2023, 10:12 AM   H[facility] COMPLEXITY

## 2023-02-16 NOTE — Progress Notes (Signed)
PROGRESS NOTE    David Joseph  WUJ:[id number] DOB: November 07, 1940 DOA: 02/15/2023 PCP: Donetta Potts, MD   Brief Narrative:  82 y.o. 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  On presentation, he was febrile to 101.4 with initial lactic acid of 8 which improved to 3 after IV fluids.  AST of 116 and ALT of 182 with total bilirubin of 12.8.  Influenza/COVID-19/RSV PCR negative.  CT of abdomen showed possible acute diverticulitis and gallbladder with sludge and some gallbladder wall thickening.  Ultrasound abdomen equivocal for acute cholecystitis and negative Murphy sign.  He was started on IV antibiotics.  General surgery at [facility] recommended HIDA scan and if positive, consider cholecystostomy drain.  He was transferred to [facility] for the same.  Assessment & Plan:   Possible acute cholecystitis with abdominal pain and hyperbilirubinemia and elevated LFTs -Imaging and labs as above.  Labs pending today. -General surgery at [facility] recommended HIDA scan and if positive, consider cholecystostomy drain.  He was transferred to [facility] for the same.  Awaiting HIDA scan. -Continue broad-spectrum antibiotics -Follow cultures -I have consulted general surgery and GI: Follow recommendations  Possible acute diverticulitis -As seen on imaging.  Continue broad-spectrum antibiotics.  Lactic acidosis -Possibly from above.  Improving  Thrombocytopenia -Questionable cause.  Platelets pending today  Acute metabolic acidosis -Mild.  Labs pending today.  Diabetes mellitus type 2 with hyperglycemia -CBC with SSI.  Metformin on  hold  Paroxysmal A-fib -Mild intermittent tachycardia present.  On heparin drip.  Eliquis on hold  Myelodysplastic syndrome -Patient had Reblozyl injection on 02/11/2023.  Follows with oncology at [facility].  Anemia of chronic disease -From chronic illnesses.  Hemoglobin 7.4 on presentation.  Pending for today.  Chronic systolic heart failure -Currently compensated.  Strict input and output and daily weights.  Continue metoprolol succinate.  Outpatient follow-up with cardiology  CPPD/RA -On Plaquenil and prednisone.  Outpatient follow-up with rheumatology  Ramsay Hunt syndrome -Continue Valtrex daily  Bilateral BKA with history of PAD    DVT prophylaxis: Heparin drip Code Status: Full Family Communication: Wife at bedside Disposition Plan: Status is: Inpatient Remains inpatient appropriate because: Of severity of illness   Consultants: General surgery at [facility] Procedures: None  Antimicrobials: Cefepime, Flagyl and vancomycin from 02/15/2023 onwards   Subjective: Patient seen and examined at bedside.  Still complains of intermittent abdominal pain with some nausea.  No chest pain or worsening shortness of breath reported.  Objective: Vitals:   02/15/23 1649 02/15/23 2106 02/16/23 0037 02/16/23 0500  BP: 126/71 125/62 120/64 118/66  Pulse: (!) 101 95 86 77  Resp: 18   17  Temp: 98.2 F (36.8 C) 99 F (37.2 C) 98 F (36.7 C) 98.1 F (36.7 C)  TempSrc:  Oral Oral Oral  SpO2: 100% 100% 100% 100%  Weight:      Height:        Intake/Output Summary (Last 24 hours) at 02/16/2023 0820 Last data filed at 02/16/2023 0501 Gross per 24 hour  Intake 2260.34 ml  Output 700 ml  Net 1560.34 ml   Filed Weights   02/15/23 0647  Weight: 60 kg    Examination:  General exam: Appears calm and comfortable.  On 2 L oxygen via nasal cannula.  Looks jaundiced. Respiratory system: Bilateral decreased breath sounds at bases with scattered crackles Cardiovascular system: S1 &  S2 heard, Rate controlled Gastrointestinal system: Abdomen is nondistended, soft and mildly tender in the right upper quadrant. Normal bowel sounds heard. Extremities: Bilateral BKA present  Central nervous system: Alert and oriented. No focal neurological deficits. Moving extremities Skin: No rashes, lesions or ulcers Psychiatry: Flat affect.  Not agitated.  Data Reviewed: I have personally reviewed following labs and imaging studies  CBC: Recent Labs  Lab 02/15/23 0728  WBC 8.5  NEUTROABS 7.6  HGB 7.4*  HCT 24.6*  MCV 137.4*  PLT 103*   Basic Metabolic Panel: Recent Labs  Lab 02/15/23 0728  NA 138  K 4.8  CL 104  CO2 18*  GLUCOSE 185*  BUN 23  CREATININE 1.00  CALCIUM 8.6*  PHOS 4.7*   GFR: Estimated Creatinine Clearance: 50 mL/min (by C-G formula based on SCr of 1 mg/dL). Liver Function Tests: Recent Labs  Lab 02/15/23 0728  AST 116*  ALT 182*  ALKPHOS 60  BILITOT 12.8*  PROT 6.1*  ALBUMIN 3.2*   No results for input(s): "LIPASE", "AMYLASE" in the last 168 hours. Recent Labs  Lab 02/15/23 0728  AMMONIA 19   Coagulation Profile: Recent Labs  Lab 02/15/23 0728  INR 1.9*   Cardiac Enzymes: No results for input(s): "CKTOTAL", "CKMB", "CKMBINDEX", "TROPONINI" in the last 168 hours. BNP (last 3 results) No results for input(s): "PROBNP" in the last 8760 hours. HbA1C: No results for input(s): "HGBA1C" in the last 72 hours. CBG: Recent Labs  Lab 02/15/23 1315 02/15/23 1647  GLUCAP 119* 122*   Lipid Profile: No results for input(s): "CHOL", "HDL", "LDLCALC", "TRIG", "CHOLHDL", "LDLDIRECT" in the last 72 hours. Thyroid Function Tests: No results for input(s): "TSH", "T4TOTAL", "FREET4", "T3FREE", "THYROIDAB" in the last 72 hours. Anemia Panel: Recent Labs    02/15/23 1206  VITAMINB12 1,212*  FOLATE 19.8  FERRITIN 2,166*  TIBC 216*  IRON 74  RETICCTPCT 4.9*   Sepsis Labs: Recent Labs  Lab 02/15/23 0728 02/15/23 0857  LATICACIDVEN  8.0* 3.1*    Recent Results (from the past 240 hour(s))  Blood Culture (routine x 2)     Status: None (Preliminary result)   Collection Time: 02/15/23  7:02 AM   Specimen: BLOOD LEFT FOREARM  Result Value Ref Range Status   Specimen Description   Final    BLOOD LEFT FOREARM BOTTLES DRAWN AEROBIC AND ANAEROBIC   Special Requests   Final    Blood Culture results may not be optimal due to an inadequate volume of blood received in culture bottles   Culture  Setup Time   Final    GRAM POSITIVE COCCI ANAEROBIC BOTTLE ONLY Gram Stain Report Called to,Read Back By and Verified With: S RICH AT 0752 ON [id number] BY S DALTON Performed at [facility], [address], [place], [place] [address]    Culture [facility] POSITIVE COCCI  Final   Report Status PENDING  Incomplete  Resp panel by RT-PCR (RSV, Flu A&B, Covid) Anterior Nasal Swab     Status: None   Collection Time: 02/15/23  7:17 AM   Specimen: Anterior Nasal Swab  Result Value Ref Range Status   SARS Coronavirus 2 by RT PCR NEGATIVE NEGATIVE Final    Comment: (NOTE) SARS-CoV-2 target nucleic acids are NOT DETECTED.  The SARS-CoV-2 RNA is generally detectable in upper respiratory specimens during the acute phase of infection. The  lowest concentration of SARS-CoV-2 viral copies this assay can detect is 138 copies/mL. A negative result does not preclude SARS-Cov-2 infection and should not be used as the sole basis for treatment or other patient management decisions. A negative result may occur with  improper specimen collection/handling, submission of specimen other than nasopharyngeal swab, presence of viral mutation(s) within the areas targeted by this assay, and inadequate number of viral copies(<138 copies/mL). A negative result must be combined with clinical observations, patient history, and epidemiological information. The expected result is Negative.  Fact Sheet for Patients:  BloggerCourse.com  Fact Sheet  for Healthcare Providers:  SeriousBroker.it  This test is no t yet approved or cleared by the [place] FDA and  has been authorized for detection and/or diagnosis of SARS-CoV-2 by FDA under an Emergency Use Authorization (EUA). This EUA will remain  in effect (meaning this test can be used) for the duration of the COVID-19 declaration under Section 564(b)(1) of the Act, 21 U.S.C.section 360bbb-3(b)(1), unless the authorization is terminated  or revoked sooner.       Influenza A by PCR NEGATIVE NEGATIVE Final   Influenza B by PCR NEGATIVE NEGATIVE Final    Comment: (NOTE) The Xpert Xpress SARS-CoV-2/FLU/RSV plus assay is intended as an aid in the diagnosis of influenza from Nasopharyngeal swab specimens and should not be used as a sole basis for treatment. Nasal washings and aspirates are unacceptable for Xpert Xpress SARS-CoV-2/FLU/RSV testing.  Fact Sheet for Patients: BloggerCourse.com  Fact Sheet for Healthcare Providers: SeriousBroker.it  This test is not yet approved or cleared by the [place] FDA and has been authorized for detection and/or diagnosis of SARS-CoV-2 by FDA under an Emergency Use Authorization (EUA). This EUA will remain in effect (meaning this test can be used) for the duration of the COVID-19 declaration under Section 564(b)(1) of the Act, 21 U.S.C. section 360bbb-3(b)(1), unless the authorization is terminated or revoked.     Resp Syncytial Virus by PCR NEGATIVE NEGATIVE Final    Comment: (NOTE) Fact Sheet for Patients: BloggerCourse.com  Fact Sheet for Healthcare Providers: SeriousBroker.it  This test is not yet approved or cleared by the [place] FDA and has been authorized for detection and/or diagnosis of SARS-CoV-2 by FDA under an Emergency Use Authorization (EUA). This EUA will remain in effect (meaning  this test can be used) for the duration of the COVID-19 declaration under Section 564(b)(1) of the Act, 21 U.S.C. section 360bbb-3(b)(1), unless the authorization is terminated or revoked.  Performed at [facility], [address], [place], [place] [address]   Blood Culture (routine x 2)     Status: None (Preliminary result)   Collection Time: 02/15/23  7:28 AM   Specimen: BLOOD RIGHT ARM  Result Value Ref Range Status   Specimen Description   Final    BLOOD RIGHT ARM BOTTLES DRAWN AEROBIC AND ANAEROBIC   Special Requests Blood Culture adequate volume  Final   Culture  Setup Time   Final    GRAM POSITIVE COCCI ANAEROBIC BOTTLE ONLY Gram Stain Report Called to,Read Back By and Verified With: S RICH AT 0752 ON [id number] BY S DALTON Performed at [facility], [address], [place], [place] [address]    Culture [facility] POSITIVE COCCI  Final   Report Status PENDING  Incomplete  MRSA Next Gen by PCR, Nasal     Status: None   Collection Time: 02/15/23 11:03 AM   Specimen: Nasal Mucosa; Nasal Swab  Result Value Ref Range Status  MRSA by PCR Next Gen NOT DETECTED NOT DETECTED Final    Comment: (NOTE) The GeneXpert MRSA Assay (FDA approved for NASAL specimens only), is one component of a comprehensive MRSA colonization surveillance program. It is not intended to diagnose MRSA infection nor to guide or monitor treatment for MRSA infections. Test performance is not FDA approved in patients less than 23 years old. Performed at [facility], [address] [place], [place] [address]          Radiology Studies: US Abdomen Limited RUQ (LIVER/GB)  Result Date: 02/15/2023 CLINICAL DATA:  Fever.  Cholelithiasis. EXAM: ULTRASOUND ABDOMEN LIMITED RIGHT UPPER QUADRANT COMPARISON:  None Available. FINDINGS: Gallbladder: Gallbladder is completely filled with numerous echogenic, shadowing stones resulting in a wall-echo-shadow sign. The visualized gallbladder wall is mildly thickened. No  sonographic Murphy sign noted by sonographer. Common bile duct: Diameter: 4 mm. Liver: No focal lesion identified. Within normal limits in parenchymal echogenicity. Portal vein is patent on color Doppler imaging with normal direction of blood flow towards the liver. Other: Right pleural effusion. IMPRESSION: 1. Cholelithiasis with mild gallbladder wall thickening. No sonographic Murphy sign noted by sonographer. Findings are equivocal for acute cholecystitis. A nuclear medicine HIDA scan could be obtained if further assessment is clinically warranted. 2. Right pleural effusion. Electronically Signed   By: Duanne Guess D.O.   On: 02/15/2023 10:26   CT CHEST ABDOMEN PELVIS WO CONTRAST  Result Date: 02/15/2023 CLINICAL DATA:  82 year old male with history of generalized weakness. Right lower abdominal pain for the past 6 months. History of prostate cancer. * Tracking Code: BO * EXAM: CT CHEST, ABDOMEN AND PELVIS WITHOUT CONTRAST TECHNIQUE: Multidetector CT imaging of the chest, abdomen and pelvis was performed following the standard protocol without IV contrast. RADIATION DOSE REDUCTION: This exam was performed according to the departmental dose-optimization program which includes automated exposure control, adjustment of the mA and/or kV according to patient size and/or use of iterative reconstruction technique. COMPARISON:  None Available. FINDINGS: CT CHEST FINDINGS Cardiovascular: Heart size is borderline enlarged. Trace amount of pericardial fluid and/or thickening, unlikely to be of any hemodynamic significance at this time. No pericardial calcification. There is aortic atherosclerosis, as well as atherosclerosis of the great vessels of the mediastinum and the coronary arteries, including calcified atherosclerotic plaque in the left main, left anterior descending, left circumflex and right coronary arteries. Right internal jugular single-lumen Port-A-Cath with tip terminating in the distal superior vena  cava. Extensive calcifications of the aortic valve. Mediastinum/Nodes: No pathologically enlarged mediastinal or hilar lymph nodes. Please note that accurate exclusion of hilar adenopathy is limited on noncontrast CT scans. Esophagus is unremarkable in appearance. No axillary lymphadenopathy. Lungs/Pleura: Small bilateral pleural effusions lying dependently (left-greater-than-right). No confluent consolidative airspace disease. No pleural effusions. Scattered areas of mild post infectious or inflammatory scarring, most evident in the inferior segment of the lingula. No definite suspicious appearing pulmonary nodules or masses are noted. Musculoskeletal: Orthopedic fixation hardware in the lower cervical spine incidentally noted. There are no aggressive appearing lytic or blastic lesions noted in the visualized portions of the skeleton. CT ABDOMEN PELVIS FINDINGS Hepatobiliary: Liver has a slightly shrunken appearance and nodular contour. No definite suspicious cystic or solid hepatic lesions are confidently identified on today's noncontrast CT examination. Gallbladder is only moderately distended but is filled with amorphous high attenuation material, presumably biliary sludge. Trace volume of pericholecystic fluid. Pancreas: No definite pancreatic mass or peripancreatic fluid collections or inflammatory changes are noted on today's noncontrast CT examination.  Spleen: Unremarkable. Adrenals/Urinary Tract: 4.4 cm exophytic low-attenuation lesion in the medial aspect of the upper pole of the right kidney, incompletely characterized on today's noncontrast CT examination, but statistically likely a cyst (no imaging follow-up recommended). Unenhanced appearance of the left kidney and bilateral adrenal glands is normal. No hydroureteronephrosis. Urinary bladder is normal in appearance. Stomach/Bowel: Unenhanced appearance of the stomach is normal. No pathologic dilatation of small bowel or colon. Subtle inflammatory  changes are noted adjacent to the descending colon where there are multiple diverticuli, concerning for possible acute diverticulitis. No discrete diverticular abscess or signs of frank perforation or confidently identified at this time. The appendix is not confidently identified and may be surgically absent. Regardless, there are no inflammatory changes noted adjacent to the cecum to suggest the presence of an acute appendicitis at this time. Vascular/Lymphatic: Atherosclerosis in the abdominal aorta and pelvic vasculature. No definite lymphadenopathy noted in the abdomen or pelvis. Reproductive: Status post radical prostatectomy. Other: No significant volume of ascites.  No pneumoperitoneum. Musculoskeletal: There are no aggressive appearing lytic or blastic lesions noted in the visualized portions of the skeleton. IMPRESSION: 1. Subtle inflammatory changes adjacent to the descending colon, which could indicate acute diverticulitis. No diverticular abscess or signs of frank perforation are noted at this time. 2. Gallbladder is moderately distended and filled with a large amount of biliary sludge. Trace volume of pericholecystic fluid noted. The possibility of acute cholecystitis should be considered. If there is any clinical concern for acute cholecystitis, further evaluation with right upper quadrant abdominal ultrasound would be recommended. 3. Small bilateral pleural effusions lying dependently. 4. Aortic atherosclerosis, in addition to left main and three-vessel coronary artery disease. 5. Trace amount of pericardial fluid and/or thickening, unlikely to be of hemodynamic significance at this time. 6. There are calcifications of the aortic valve. Echocardiographic correlation for evaluation of potential valvular dysfunction may be warranted if clinically indicated. 7. Morphologic changes in the liver suggestive of underlying cirrhosis. 8. Additional incidental imaging findings, as above. Electronically Signed    By: Trudie Reed M.D.   On: 02/15/2023 09:13   DG Chest Port 1 View  Result Date: 02/15/2023 CLINICAL DATA:  Questionable sepsis. EXAM: PORTABLE CHEST 1 VIEW COMPARISON:  05/30/2010 FINDINGS: Cardiopericardial enlargement and aortic tortuosity. Diffuse interstitial prominence with congested appearance of central vessels and symmetric hazy density at the bases. No visible effusion or pneumothorax. Right port with tip at the SVC. IMPRESSION: Cardiomegaly and vascular congestion. Pneumonia cannot be excluded at the bases. Electronically Signed   By: Tiburcio Pea M.D.   On: 02/15/2023 07:56        Scheduled Meds:  ascorbic acid  500 mg Oral Daily   cholecalciferol  1,000 Units Oral Daily   hydroxychloroquine  200 mg Oral Daily   metoprolol succinate  12.5 mg Oral BID   pantoprazole  40 mg Oral Daily   potassium chloride SA  20 mEq Oral Daily   predniSONE  15 mg Oral Daily   pregabalin  100 mg Oral TID   valACYclovir  500 mg Oral Daily   Continuous Infusions:  ceFEPime (MAXIPIME) IV 2 g (02/16/23 0759)   heparin 800 Units/hr (02/16/23 0501)   metronidazole Stopped (02/16/23 4098)   vancomycin            Glade Lloyd, MD [facility] 02/16/2023, 8:20 AM

## 2023-02-16 NOTE — Progress Notes (Signed)
RN cleaned left lower stump wounds with betadine, RN placed calcium alginate, abd and kerlix around stump per patient and family member request. Patient is made aware that a wound nurse is ordered and adjust how he or she feels necessary.

## 2023-02-16 NOTE — Consult Note (Addendum)
Pharmacy Consult Note - Anticoagulation  Pharmacy Consult for heparin Indication: atrial fibrillation  PATIENT MEASUREMENTS: Height: 5\' 8"  (172.7 cm) Weight: 60 kg (132 lb 4.4 oz) IBW/kg (Calculated) : 68.4 HEPARIN DW (KG): 60  VITAL SIGNS: Temp: 98.1 F (36.7 C) (12/01 0838) Temp Source: Oral (12/01 0838) BP: 113/61 (12/01 0838) Pulse Rate: 72 (12/01 0838)  Recent Labs    02/15/23 0728 02/16/23 0755  HGB 7.4* 6.2*  HCT 24.6* 19.6*  PLT 103* 48*  APTT 35 83*  LABPROT 22.0*  --   INR 1.9*  --   HEPARINUNFRC  --  0.53  CREATININE 1.00 1.16    Estimated Creatinine Clearance: 43.1 mL/min (by C-G formula based on SCr of 1.16 mg/dL).  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 Patient is on chronic anticoagulation with Eliquis. Last dose was 11/28 @ 0900. Pharmacy has been consulted to initiate and manage heparin intravenous infusion.  Heparin level 0.53 and aPTT 83 on drip rate 800 units/hr therapeutic. Heparin level and aPTT continues to be therapeutic. Tbili > 6.6 mg/dL may cause falsely suppressed heparin levels. Plan to use aPTTs for efficacy assessment since tbili today is 10. Hgb 6.2 and plt 48 significantly decreased from yesterday. No s/sx bleeding per RN. Discussed with TRH.  Plan for patient to receive blood transfusion.   Goal(s) of therapy: Heparin level 0.3 - 0.7 units/mL aPTT 66 - 102 seconds Monitor platelets by anticoagulation protocol: Yes   PLAN: HOLD heparin infusion for now per discussion with [facility] will continue to follow for re-initiation of AC as clinically appropriate  Monitor daily CBC and trend Hgb and plt    Jerry Caras, PharmD PGY2 Oncology Pharmacy Resident   02/16/2023 9:35 AM

## 2023-02-16 NOTE — Consult Note (Signed)
Referring Provider: Dr. Hanley Ben, [facility] Primary Care Physician:  Donetta Potts, MD Primary Gastroenterologist: [facility]  Reason for Consultation:  Elevated LFTs  HPI: David Joseph is a 82 y.o. 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  It looks like his total bilirubin has been elevated to some degree for several months with progressive increase in total bilirubin.  Total bilirubin now up to 12.8 yesterday, down to 10 today.  Alk phos normal, ALT up to 182 and down to 143 today, AST up to 116 down to 68 today.  INR is elevated at 1.9.  Vitamin B12 and folate levels are normal.  Iron studies essentially normal with an elevated ferritin.  Hep B studies negative earlier this year.  Hemoglobin 6.2 g today so getting 2 units of PRBCs.  Platelets 48K.  Cultures positive for gram-positive cocci.  CT scan of the chest, abdomen, and pelvis without contrast shows the following:  IMPRESSION: 1. Subtle inflammatory changes adjacent to the descending colon, which could indicate acute diverticulitis. No diverticular abscess or signs of frank perforation are noted at this time. 2. Gallbladder is moderately distended and filled with a large amount of biliary sludge. Trace volume of pericholecystic fluid noted. The possibility of acute cholecystitis should be considered. If there is any clinical concern for acute cholecystitis, further evaluation with right upper quadrant abdominal ultrasound would be recommended. 3. Small bilateral pleural effusions lying  dependently. 4. Aortic atherosclerosis, in addition to left main and three-vessel coronary artery disease. 5. Trace amount of pericardial fluid and/or thickening, unlikely to be of hemodynamic significance at this time. 6. There are calcifications of the aortic valve. Echocardiographic correlation for evaluation of potential valvular dysfunction may be warranted if clinically indicated. 7. Morphologic changes in the liver suggestive of underlying cirrhosis. 8. Additional incidental imaging findings, as above.   Ultrasound shows cholelithiasis with mild gallbladder wall thickening, findings equivocal for acute cholecystitis with HIDA scan recommended.  That has been ordered.  Colonoscopy through [facility] November 2022 that showed only diverticulosis in the sigmoid, descending, transverse colon.  Patient denies any known history of liver disease.  No family history of liver disease to their knowledge.  He reports some intermittent mid abdominal pains, but they are fleeting, resolving within minutes.  Has a lot of phantom pain in his limbs.  No black or bloody stool noted.  Would like something to eat.   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   Diverticulitis    10 years ago   DVT (deep venous thrombosis) (HCC) 04/09/2015   left leg- recently   Heart failure (HCC)    Pneumonia 13 years ago   Presence of internal carotid stent    Prostate CA (HCC)    Rheumatoid arthritis [facility])     Past Surgical History:  Procedure Laterality Date   ATRIAL FIBRILLATION ABLATION  02/12/2022   BACK SURGERY  03/18/1978  lower    basal cell areas removed  Sept 2013, 2012   left lower lip and chest   CERVICAL FUSION     COLONOSCOPY  10/17/2011   EYE SURGERY  2 years ago   both cataract   JOINT REPLACEMENT  March 2012, 02/2012   right knee,  redo right knee   left middle finger surgery  05/17/2010   PROSTATECTOMY  12/16/2013   at [facility]   TONSILLECTOMY  60 years ago   TOTAL KNEE ARTHROPLASTY Left 10/09/2015   Procedure: LEFT TOTAL KNEE ARTHROPLASTY;  Surgeon: Ollen Gross, MD;  Location: [facility];  Service: Orthopedics;  Laterality: Left;   TOTAL KNEE REVISION  02/19/2012   Procedure: TOTAL KNEE REVISION;  Surgeon: Loanne Drilling, MD;  Location: [facility];  Service: Orthopedics;  Laterality: Right;    Prior to Admission medications   Medication Sig Start Date End Date Taking? Authorizing Provider  apixaban (ELIQUIS) 5 MG TABS tablet Take 5 mg by mouth 2 (two) times daily.   Yes [provider]  atorvastatin (LIPITOR) 80 MG tablet Take 80 mg by mouth daily.   Yes [provider]  Cholecalciferol (VITAMIN D3) 1000 units CAPS Take 1 tablet by mouth daily.   Yes [provider]  glipiZIDE (GLUCOTROL) 5 MG tablet Take 5 mg by mouth daily.   Yes [provider]  hydroxychloroquine (PLAQUENIL) 200 MG tablet Take 200 mg by mouth daily.   Yes [provider]  lactose free nutrition (BOOST) LIQD Take 237 mLs by mouth daily.   Yes [provider]  Luspatercept-aamt (REBLOZYL [place]) Inject into the skin every 21 ( twenty-one) days.   Yes [provider]  metoprolol succinate (TOPROL-XL) 25 MG 24 hr tablet Take 0.5 tablets (12.5 mg total) by mouth 2 (two) times daily. 04/08/22  Yes Love, Evlyn Kanner, PA-C  pantoprazole (PROTONIX) 40 MG tablet Take 1 tablet (40 mg total) by mouth daily. 04/09/22  Yes Love, Evlyn Kanner, PA-C  polyethylene glycol (MIRALAX / GLYCOLAX) 17 g packet Take 17 g by mouth daily as needed. 04/08/22  Yes Love, Evlyn Kanner, PA-C  predniSONE (DELTASONE) 5 MG tablet Take 15 mg by mouth daily.   Yes [provider]  pregabalin (LYRICA) 100 MG capsule Take 1 capsule (100 mg total) by mouth 3 (three) times daily. Patient taking differently: Take 100 mg by mouth 2 (two) times  daily. 04/08/22  Yes Love, Evlyn Kanner, PA-C  valACYclovir (VALTREX) 500 MG tablet Take 500 mg by mouth daily.   Yes [provider]  oxycodone (OXY-IR) 5 MG capsule Take 5 mg by mouth every 4 (four) hours as needed for pain. Patient not taking: Reported on 02/15/2023    [provider]    Current Facility-Administered Medications  Medication Dose Route Frequency Provider Last Rate Last Admin   0.9 %  sodium chloride infusion (Manually program via Guardrails IV Fluids)   Intravenous Once Glade Lloyd, MD       acetaminophen (TYLENOL) tablet 650 mg  650 mg Oral Q6H PRN Sherryll Burger, Pratik D, DO       Or   acetaminophen (TYLENOL) suppository 650 mg  650 mg Rectal Q6H PRN Sherryll Burger, Pratik D, DO       ascorbic acid (VITAMIN C) tablet 500 mg  500 mg Oral Daily Sherryll Burger, Pratik D, DO   500 mg at 02/16/23 0800   ceFEPIme (MAXIPIME) 2 g in sodium chloride 0.9 % 100 mL IVPB  2 g Intravenous Q12H Sherryll Burger,  Pratik D, DO 200 mL/hr at 02/16/23 0759 2 g at 02/16/23 0759   Chlorhexidine Gluconate Cloth 2 % PADS 6 each  6 each Topical Daily Glade Lloyd, MD   6 each at 02/16/23 0936   cholecalciferol (VITAMIN D3) 25 MCG (1000 UNIT) tablet 1,000 Units  1,000 Units Oral Daily Sherryll Burger, Pratik D, DO   1,000 Units at 02/16/23 0800   HYDROmorphone (DILAUDID) injection 0.5-1 mg  0.5-1 mg Intravenous Q2H PRN Sherryll Burger, Pratik D, DO       hydroxychloroquine (PLAQUENIL) tablet 200 mg  200 mg Oral Daily Sherryll Burger, Pratik D, DO   200 mg at 02/16/23 0800   metoprolol succinate (TOPROL-XL) 24 hr tablet 12.5 mg  12.5 mg Oral BID Maurilio Lovely D, DO   12.5 mg at 02/16/23 0800   metroNIDAZOLE (FLAGYL) IVPB 500 mg  500 mg Intravenous Q12H Sherryll Burger, Pratik D, DO 100 mL/hr at 02/16/23 0932 500 mg at 02/16/23 0932   ondansetron (ZOFRAN) tablet 4 mg  4 mg Oral Q6H PRN Maurilio Lovely D, DO       Or   ondansetron [facility]) injection 4 mg  4 mg Intravenous Q6H PRN Sherryll Burger, Pratik D, DO   4 mg at 02/15/23 1508   pantoprazole (PROTONIX) EC tablet 40 mg  40 mg  Oral Daily Sherryll Burger, Pratik D, DO   40 mg at 02/16/23 0800   polyethylene glycol (MIRALAX / GLYCOLAX) packet 17 g  17 g Oral Daily PRN Sherryll Burger, Pratik D, DO       potassium chloride SA (KLOR-CON M) CR tablet 20 mEq  20 mEq Oral Daily Sherryll Burger, Pratik D, DO   20 mEq at 02/16/23 0800   predniSONE (DELTASONE) tablet 15 mg  15 mg Oral Daily Sherryll Burger, Pratik D, DO   15 mg at 02/16/23 0800   pregabalin (LYRICA) capsule 100 mg  100 mg Oral TID Sherryll Burger, Pratik D, DO   100 mg at 02/16/23 0800   sodium chloride flush (NS) 0.9 % injection 10-40 mL  10-40 mL Intracatheter PRN Glade Lloyd, MD       valACYclovir (VALTREX) tablet 500 mg  500 mg Oral Daily Sherryll Burger, Pratik D, DO   500 mg at 02/16/23 0800   vancomycin (VANCOCIN) IVPB 1000 mg/200 mL premix  1,000 mg Intravenous Q24H Maurilio Lovely D, DO 200 mL/hr at 02/16/23 0853 1,000 mg at 02/16/23 0853    Allergies as of 02/15/2023 - Review Complete 02/15/2023  Allergen Reaction Noted   Iodinated contrast media Rash 09/25/2015    Family History  Problem Relation Age of Onset   Heart disease Mother    Anemia Father    Multiple sclerosis Brother     Social History   Socioeconomic History   Marital status: Married    Spouse name: Deano Parello   Number of children: Not on file   Years of education: 12   Highest education level: 12th grade  Occupational History   Not on file  Tobacco Use   Smoking status: Former    Types: Cigars    Quit date: 01/27/2012    Years since quitting: 11.0    Passive exposure: Past   Smokeless tobacco: Never   Tobacco comments:    cigars twice a week  Vaping Use   Vaping status: Never Used  Substance and Sexual Activity   Alcohol use: Not Currently   Drug use: No   Sexual activity: Not Currently  Other Topics Concern   Not on file  Social History Narrative  Not on file   Social Determinants of Health   Financial Resource Strain: Low Risk  (05/20/2022)   [facility] from [facility]   Overall Financial  Resource Strain (CARDIA)    Difficulty of Paying Living Expenses: Not hard at all  Food Insecurity: No Food Insecurity (02/15/2023)   Hunger Vital Sign    Worried About Running Out of Food in the Last Year: Never true    Ran Out of Food in the Last Year: Never true  Transportation Needs: No Transportation Needs (02/15/2023)   PRAPARE - Administrator, Civil Service (Medical): No    Lack of Transportation (Non-Medical): No  Physical Activity: Inactive (10/24/2022)   Received from [facility]   Exercise Vital Sign    Days of Exercise per Week: 0 days    Minutes of Exercise per Session: 0 min  Stress: No Stress Concern Present (10/24/2022)   Received from [facility] of Occupational Health - Occupational Stress Questionnaire    Feeling of Stress : Not at all  Social Connections: Socially Integrated (10/15/2021)   Social Connection and Isolation Panel [NHANES]    Frequency of Communication with Friends and Family: More than three times a week    Frequency of Social Gatherings with Friends and Family: More than three times a week    Attends Religious Services: More than 4 times per year    Active Member of [employer] or Organizations: Yes    Attends Engineer, structural: More than 4 times per year    Marital Status: Married  Catering manager Violence: Not At Risk (02/15/2023)   Humiliation, Afraid, Rape, and Kick questionnaire    Fear of Current or Ex-Partner: No    Emotionally Abused: No    Physically Abused: No    Sexually Abused: No    Review of Systems: ROS is O/W negative except as mentioned in HPI.  Physical Exam: Vital signs in last 24 hours: Temp:  [97.3 F (36.3 C)-99 F (37.2 C)] 98.1 F (36.7 C) (12/01 0838) Pulse Rate:  [72-101] 72 (12/01 0838) Resp:  [16-21] 17 (12/01 0838) BP: (100-126)/(52-71) 113/61 (12/01 0838) SpO2:  [96 %-100 %] 100 % (12/01 0838) FiO2 (%):  [0 %] 0 % (11/30 1203) Last BM Date : 02/14/23 General:   Alert, chronically ill-appearing, pleasant and cooperative in NAD; jaundice noted. Head:  Normocephalic and atraumatic. Eyes:  Scleral icterus noted. Ears:  Normal auditory acuity. Mouth:  No deformity or lesions.   Lungs:  Clear throughout to auscultation.  No wheezes, crackles, or rhonchi.  Heart:  Regular rate and rhythm; no murmurs, clicks, rubs, or gallops. Abdomen:  Soft, non-distended.  BS present.  Mild lower abdominal TTP. Pulses:  Normal pulses noted. Extremities:  B/L BKA noted. Neurologic:  Alert and oriented x 4;  grossly normal neurologically. Psych:  Alert and cooperative. Normal mood and affect.  Intake/Output from previous day: 11/30 0701 - 12/01 0700 In: 2260.3 [I.V.:668.7; IV Piggyback:1591.7] Out: 700 [Urine:700] Intake/Output this shift: Total I/O In: 220 [P.O.:220] Out: -   Lab Results: Recent Labs    02/15/23 0728 02/16/23 0755  WBC 8.5 5.4  HGB 7.4* 6.2*  HCT 24.6* 19.6*  PLT 103* 48*   BMET Recent Labs    02/15/23 0728 02/16/23 0755  NA 138 138  K 4.8 3.9  CL 104 108  CO2 18* 21*  GLUCOSE 185* 142*  BUN 23 20  CREATININE 1.00 1.16  CALCIUM 8.6* 7.8*   LFT Recent Labs    02/16/23 0755  PROT 4.9*  ALBUMIN 2.4*  AST 68*  ALT 143*  ALKPHOS 48  BILITOT 10.0*   PT/INR Recent Labs    02/15/23 0728  LABPROT 22.0*  INR 1.9*   Studies/Results: US Abdomen Limited RUQ (LIVER/GB)  Result Date: 02/15/2023 CLINICAL DATA:  Fever.  Cholelithiasis. EXAM: ULTRASOUND ABDOMEN LIMITED RIGHT UPPER QUADRANT COMPARISON:  None Available. FINDINGS: Gallbladder: Gallbladder is completely filled with numerous echogenic, shadowing stones resulting in a wall-echo-shadow sign. The visualized gallbladder wall is mildly thickened. No sonographic Murphy sign noted by sonographer. Common bile duct: Diameter: 4 mm. Liver: No focal lesion identified. Within normal limits in parenchymal echogenicity. Portal vein is patent on color Doppler imaging with normal  direction of blood flow towards the liver. Other: Right pleural effusion. IMPRESSION: 1. Cholelithiasis with mild gallbladder wall thickening. No sonographic Murphy sign noted by sonographer. Findings are equivocal for acute cholecystitis. A nuclear medicine HIDA scan could be obtained if further assessment is clinically warranted. 2. Right pleural effusion. Electronically Signed   By: Duanne Guess D.O.   On: 02/15/2023 10:26   CT CHEST ABDOMEN PELVIS WO CONTRAST  Result Date: 02/15/2023 CLINICAL DATA:  82 year old male with history of generalized weakness. Right lower abdominal pain for the past 6 months. History of prostate cancer. * Tracking Code: BO * EXAM: CT CHEST, ABDOMEN AND PELVIS WITHOUT CONTRAST TECHNIQUE: Multidetector CT imaging of the chest, abdomen and pelvis was performed following the standard protocol without IV contrast. RADIATION DOSE REDUCTION: This exam was performed according to the departmental dose-optimization program which includes automated exposure control, adjustment of the mA and/or kV according to patient size and/or use of iterative reconstruction technique. COMPARISON:  None Available. FINDINGS: CT CHEST FINDINGS Cardiovascular: Heart size is borderline enlarged. Trace amount of pericardial fluid and/or thickening, unlikely to be of any hemodynamic significance at this time. No pericardial calcification. There is aortic atherosclerosis, as well as atherosclerosis of the great vessels of the mediastinum and the coronary arteries, including calcified atherosclerotic plaque in the left main, left anterior descending, left circumflex and right coronary arteries. Right internal jugular single-lumen Port-A-Cath with tip terminating in the distal superior vena cava. Extensive calcifications of the aortic valve. Mediastinum/Nodes: No pathologically enlarged mediastinal or hilar lymph nodes. Please note that accurate exclusion of hilar adenopathy is limited on noncontrast CT scans.  Esophagus is unremarkable in appearance. No axillary lymphadenopathy. Lungs/Pleura: Small bilateral pleural effusions lying dependently (left-greater-than-right). No confluent consolidative airspace disease. No pleural effusions. Scattered areas of mild post infectious or inflammatory scarring, most evident in the inferior segment of the lingula. No definite suspicious appearing pulmonary nodules or masses are noted. Musculoskeletal: Orthopedic fixation hardware in the lower cervical spine incidentally noted. There are no aggressive appearing lytic or blastic lesions noted in the visualized portions of the skeleton. CT ABDOMEN PELVIS FINDINGS Hepatobiliary: Liver has a slightly shrunken appearance and nodular contour. No definite suspicious cystic or solid hepatic lesions are confidently identified on today's noncontrast CT examination. Gallbladder is only moderately distended but is filled with amorphous high attenuation material, presumably biliary sludge. Trace volume of pericholecystic fluid. Pancreas: No definite pancreatic mass or peripancreatic fluid collections or inflammatory changes are noted on today's noncontrast CT examination. Spleen: Unremarkable. Adrenals/Urinary Tract: 4.4 cm exophytic low-attenuation lesion in the medial aspect of the upper pole of the right kidney, incompletely characterized on today's noncontrast CT examination, but statistically likely a cyst (no imaging follow-up recommended).  Unenhanced appearance of the left kidney and bilateral adrenal glands is normal. No hydroureteronephrosis. Urinary bladder is normal in appearance. Stomach/Bowel: Unenhanced appearance of the stomach is normal. No pathologic dilatation of small bowel or colon. Subtle inflammatory changes are noted adjacent to the descending colon where there are multiple diverticuli, concerning for possible acute diverticulitis. No discrete diverticular abscess or signs of frank perforation or confidently identified at  this time. The appendix is not confidently identified and may be surgically absent. Regardless, there are no inflammatory changes noted adjacent to the cecum to suggest the presence of an acute appendicitis at this time. Vascular/Lymphatic: Atherosclerosis in the abdominal aorta and pelvic vasculature. No definite lymphadenopathy noted in the abdomen or pelvis. Reproductive: Status post radical prostatectomy. Other: No significant volume of ascites.  No pneumoperitoneum. Musculoskeletal: There are no aggressive appearing lytic or blastic lesions noted in the visualized portions of the skeleton. IMPRESSION: 1. Subtle inflammatory changes adjacent to the descending colon, which could indicate acute diverticulitis. No diverticular abscess or signs of frank perforation are noted at this time. 2. Gallbladder is moderately distended and filled with a large amount of biliary sludge. Trace volume of pericholecystic fluid noted. The possibility of acute cholecystitis should be considered. If there is any clinical concern for acute cholecystitis, further evaluation with right upper quadrant abdominal ultrasound would be recommended. 3. Small bilateral pleural effusions lying dependently. 4. Aortic atherosclerosis, in addition to left main and three-vessel coronary artery disease. 5. Trace amount of pericardial fluid and/or thickening, unlikely to be of hemodynamic significance at this time. 6. There are calcifications of the aortic valve. Echocardiographic correlation for evaluation of potential valvular dysfunction may be warranted if clinically indicated. 7. Morphologic changes in the liver suggestive of underlying cirrhosis. 8. Additional incidental imaging findings, as above. Electronically Signed   By: Trudie Reed M.D.   On: 02/15/2023 09:13   DG Chest Port 1 View  Result Date: 02/15/2023 CLINICAL DATA:  Questionable sepsis. EXAM: PORTABLE CHEST 1 VIEW COMPARISON:  05/30/2010 FINDINGS: Cardiopericardial  enlargement and aortic tortuosity. Diffuse interstitial prominence with congested appearance of central vessels and symmetric hazy density at the bases. No visible effusion or pneumothorax. Right port with tip at the SVC. IMPRESSION: Cardiomegaly and vascular congestion. Pneumonia cannot be excluded at the bases. Electronically Signed   By: Tiburcio Pea M.D.   On: 02/15/2023 07:56    IMPRESSION:  *82 year old male with elevated LFTs: Appears that his bilirubin has been elevated for several months with a slowly progressive increase (wife says they were told that it was from hemolysis from his MDS).  Now AST and ALT are also elevated.  Alk phos normal.  CT scan without contrast suggest findings of possible cirrhosis, but ultrasound said normal parenchyma.  Ultrasound equivocal for cholecystitis.  HIDA scan ordered but surgery says no acute cholecystitis and we agree so HIDA has been cancelled.  No known documented history of cirrhosis.  Hep B studies negative recently.  Bili could be up from hemolysis, cholestasis related to acute sepsis, worsening of any chronic liver disease, etc.   *Macrocytic anemia with a hemoglobin of 6.2 g this morning.  Hemoglobin chronically low dating back 7 years at least due to MDS and receives intermittent transfusions.  MCV is 128.  Going to receive 2 units of packed red blood cells.  Vitamin B12 and folate levels are normal.  Iron studies normal with a high ferritin. *Bacteremia with gram-positive cocci on blood cultures:  ? Source as this is not  typically GI/gallbladder source.  ? Skin from stumps. *Thrombocytopenia: Mild previously, but acutely during this hospital stay platelets down to 48K this morning, which could be related to sepsis. *Coagulopathy with INR of 1.9 *Inflammatory changes adjacent to the descending colon indicating possible acute diverticulitis: On broad-spectrum antibiotics.  Do not think he clinically has diverticulitis. 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  Will check some autoimmune labs including ANA, IgG, smooth muscle antibody, AMA. -Will check an LDH and haptoglobin for hemolysis which could explain increase in bilirubin and drop in hemoglobin. -Agree with heme-onc consult. -Abx as have been ordered. -Will allow a heart healthy diet since no plans for testing or procedures at this time.   Princella Pellegrini. Ellizabeth Dacruz  02/16/2023, 10:10 AM

## 2023-02-16 NOTE — Progress Notes (Signed)
PHARMACY - PHYSICIAN COMMUNICATION CRITICAL VALUE ALERT - BLOOD CULTURE IDENTIFICATION (BCID)  David Joseph is an 82 y.o. male who presented to [facility] on 02/15/2023 with a chief complaint of acute cholecystitis  Assessment:  IV abx for cholecystitis/diverticulitis   Name of physician (or Provider) Contacted: Dr. Jaclynn Major  Current antibiotics: vancomycin, cefepime, metronidazole   Changes to prescribed antibiotics recommended: change vancomycin and cefepime to cefazolin   Recommendation declined by provider for now with ongoing clinical workup. Would like to defer to ID consult. Patient will continue to receive adequate coverage with current antibiotic regimen.   Results for orders placed or performed during the hospital encounter of 02/15/23  Blood Culture ID Panel (Reflexed) (Collected: 02/15/2023  7:28 AM)  Result Value Ref Range   Enterococcus faecalis NOT DETECTED NOT DETECTED   Enterococcus Faecium NOT DETECTED NOT DETECTED   Listeria monocytogenes NOT DETECTED NOT DETECTED   Staphylococcus species DETECTED (A) NOT DETECTED   Staphylococcus aureus (BCID) DETECTED (A) NOT DETECTED   Staphylococcus epidermidis NOT DETECTED NOT DETECTED   Staphylococcus lugdunensis NOT DETECTED NOT DETECTED   Streptococcus species NOT DETECTED NOT DETECTED   Streptococcus agalactiae NOT DETECTED NOT DETECTED   Streptococcus pneumoniae NOT DETECTED NOT DETECTED   Streptococcus pyogenes NOT DETECTED NOT DETECTED   A.calcoaceticus-baumannii NOT DETECTED NOT DETECTED   Bacteroides fragilis NOT DETECTED NOT DETECTED   Enterobacterales NOT DETECTED NOT DETECTED   Enterobacter cloacae complex NOT DETECTED NOT DETECTED   Escherichia coli NOT DETECTED NOT DETECTED   Klebsiella aerogenes NOT DETECTED NOT DETECTED   Klebsiella oxytoca NOT DETECTED NOT DETECTED   Klebsiella pneumoniae NOT DETECTED NOT DETECTED   Proteus species NOT DETECTED NOT DETECTED   Salmonella species NOT DETECTED NOT DETECTED    Serratia marcescens NOT DETECTED NOT DETECTED   Haemophilus influenzae NOT DETECTED NOT DETECTED   Neisseria meningitidis NOT DETECTED NOT DETECTED   Pseudomonas aeruginosa NOT DETECTED NOT DETECTED   Stenotrophomonas maltophilia NOT DETECTED NOT DETECTED   Candida albicans NOT DETECTED NOT DETECTED   Candida auris NOT DETECTED NOT DETECTED   Candida glabrata NOT DETECTED NOT DETECTED   Candida krusei NOT DETECTED NOT DETECTED   Candida parapsilosis NOT DETECTED NOT DETECTED   Candida tropicalis NOT DETECTED NOT DETECTED   Cryptococcus neoformans/gattii NOT DETECTED NOT DETECTED   Meth resistant mecA/C and MREJ NOT DETECTED NOT DETECTED    Jerry Caras, PharmD PGY2 Oncology Pharmacy Resident   02/16/2023 2:36 PM

## 2023-02-16 NOTE — Plan of Care (Signed)

## 2023-02-17 ENCOUNTER — Inpatient Hospital Stay (HOSPITAL_COMMUNITY): Payer: Medicare Other

## 2023-02-17 DIAGNOSIS — D469 Myelodysplastic syndrome, unspecified: Secondary | ICD-10-CM | POA: Diagnosis not present

## 2023-02-17 DIAGNOSIS — I38 Endocarditis, valve unspecified: Secondary | ICD-10-CM | POA: Diagnosis not present

## 2023-02-17 DIAGNOSIS — B9561 Methicillin susceptible Staphylococcus aureus infection as the cause of diseases classified elsewhere: Secondary | ICD-10-CM

## 2023-02-17 DIAGNOSIS — D649 Anemia, unspecified: Secondary | ICD-10-CM | POA: Diagnosis not present

## 2023-02-17 DIAGNOSIS — R748 Abnormal levels of other serum enzymes: Secondary | ICD-10-CM | POA: Diagnosis not present

## 2023-02-17 DIAGNOSIS — R7881 Bacteremia: Secondary | ICD-10-CM | POA: Diagnosis not present

## 2023-02-17 DIAGNOSIS — K819 Cholecystitis, unspecified: Secondary | ICD-10-CM | POA: Diagnosis not present

## 2023-02-17 LAB — HAPTOGLOBIN: Haptoglobin: 83 mg/dL (ref 34–355)

## 2023-02-17 LAB — ANA: Anti Nuclear Antibody (ANA): NEGATIVE

## 2023-02-17 LAB — CBC
HCT: 23.1 % — ABNORMAL LOW (ref 39.0–52.0)
Hemoglobin: 7.5 g/dL — ABNORMAL LOW (ref 13.0–17.0)
MCH: 38.3 pg — ABNORMAL HIGH (ref 26.0–34.0)
MCHC: 32.5 g/dL (ref 30.0–36.0)
MCV: 117.9 fL — ABNORMAL HIGH (ref 80.0–100.0)
Platelets: 38 10*3/uL — ABNORMAL LOW (ref 150–400)
RBC: 1.96 MIL/uL — ABNORMAL LOW (ref 4.22–5.81)
WBC: 6.9 10*3/uL (ref 4.0–10.5)
nRBC: 1.4 % — ABNORMAL HIGH (ref 0.0–0.2)

## 2023-02-17 LAB — COMPREHENSIVE METABOLIC PANEL
ALT: 150 U/L — ABNORMAL HIGH (ref 0–44)
AST: 72 U/L — ABNORMAL HIGH (ref 15–41)
Albumin: 2.3 g/dL — ABNORMAL LOW (ref 3.5–5.0)
Alkaline Phosphatase: 50 U/L (ref 38–126)
Anion gap: 8 (ref 5–15)
BUN: 27 mg/dL — ABNORMAL HIGH (ref 8–23)
CO2: 21 mmol/L — ABNORMAL LOW (ref 22–32)
Calcium: 7.7 mg/dL — ABNORMAL LOW (ref 8.9–10.3)
Chloride: 105 mmol/L (ref 98–111)
Creatinine, Ser: 1 mg/dL (ref 0.61–1.24)
GFR, Estimated: >60 mL/min (ref >60)
Glucose, Bld: 188 mg/dL — ABNORMAL HIGH (ref 70–99)
Potassium: 4.3 mmol/L (ref 3.5–5.1)
Sodium: 134 mmol/L — ABNORMAL LOW (ref 135–145)
Total Bilirubin: 10.2 mg/dL — ABNORMAL HIGH (ref <1.2)
Total Protein: 5.1 g/dL — ABNORMAL LOW (ref 6.5–8.1)

## 2023-02-17 LAB — ANTI-SMOOTH MUSCLE ANTIBODY, IGG: F-Actin IgG: 3 U (ref 0–19)

## 2023-02-17 LAB — ECHOCARDIOGRAM COMPLETE BUBBLE STUDY
Area-P 1/2: 4.46 cm2
Calc EF: 38.9 %
MV M vel: 4.6 m/s
MV Peak grad: 84.6 mm[Hg]
S' Lateral: 3.9 cm
Single Plane A2C EF: 32.3 %
Single Plane A4C EF: 42.1 %

## 2023-02-17 LAB — IGG: IgG (Immunoglobin G), Serum: 715 mg/dL (ref 603–1613)

## 2023-02-17 LAB — MAGNESIUM: Magnesium: 2 mg/dL (ref 1.7–2.4)

## 2023-02-17 LAB — C-REACTIVE PROTEIN: CRP: 10.1 mg/dL — ABNORMAL HIGH (ref <1.0)

## 2023-02-17 LAB — MITOCHONDRIAL ANTIBODIES: Mitochondrial M2 Ab, IgG: <20 U (ref 0.0–20.0)

## 2023-02-17 MED ORDER — GADOBUTROL 1 MMOL/ML IV SOLN
6.0000 mL | Freq: Once | INTRAVENOUS | Status: AC | PRN
  Administered 2023-02-17: 6 mL via INTRAVENOUS

## 2023-02-17 MED ORDER — CEFAZOLIN SODIUM-DEXTROSE 2-4 GM/100ML-% IV SOLN
2.0000 g | Freq: Three times a day (TID) | INTRAVENOUS | Status: DC
  Administered 2023-02-17 – 2023-02-24 (×23): 2 g via INTRAVENOUS
  Filled 2023-02-17 (×23): qty 100

## 2023-02-17 MED ORDER — CEFAZOLIN SODIUM-DEXTROSE 1-4 GM/50ML-% IV SOLN
1.0000 g | Freq: Three times a day (TID) | INTRAVENOUS | Status: DC
  Filled 2023-02-17: qty 50

## 2023-02-17 NOTE — Consult Note (Signed)
[facility]    Date of Admission:  02/15/2023   Total days of inpatient antibiotics 1        Reason for Consult: MSSA bacteremia    Principal Problem:   Cholecystitis Active Problems:   OA (osteoarthritis) of knee   Rheumatoid arthritis (HCC)   Chronic systolic congestive heart failure (HCC)   Anemia   S/P unilateral BKA (below knee amputation), right (HCC)   Severe sepsis 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  Blood cultures on admission grew 2/2 sets MSSA. - CT abdomen pelvis on admission showed concern for acute diverticulitis and gallbladder with sludge and gallbladder wall thickening.  Ultrasound equivoca. - General Surgery engaged and recommended GI consult given clinically he did not seem to have evidence of cholecystitis or diverticulitis although AST/ALT were elevated with T. bili 12.8.  GI had plans on doing a liver workup for possible underlying cirrhosis.  Both general surgery and GI recommend heme-onc Recommendations:  -Right chest port needs to come out given MSSA bacteremia - TTE need TEE -Discontinue vancomycin and cefepime  - Start cefazolin -Continue metronidazole - Patient does states that he had bumped his left stump a couple of times. could be possible portal entry of bacteremia. - Repeat blood cultures to ensure clearance  #Diabetes mellitus -Management per primary Microbiology:   Antibiotics: Cefepime 11/30-present Metronidazole 9/30-present Vancomycin 11/30-present  Cultures: Blood 11/32/2 sets MSSA Urine  Other   HPI: David Joseph is a  82 y.o. 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  Patient has had poor appetite with 2 episodes of vomiting.HE received Relozyl on 11/26 for MDS.  -Patient had temp of 101.4 admission with WBC 8.4K.  Found to have elevated AST/ALT 116/182 with bilirubin 12.8.  CT abdomen pelvis showed some concern for acute diverticulitis and gallbladder with sludge and gallbladder wall thickening.  Ultrasound equivocal.  Blood cultures grew 2 out of 2 MSSA.General surgery engaged, recommend GI consult.  GI plans of further workup of the liver to see if he has underlying cirrhosis.  Describes remote heavy drinking about 20 years ago.  Both GI and general surgery recommended heme-onc consult.  Review of Systems: Review of Systems  All other systems reviewed and are negative.   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   Diverticulitis    10 years ago   DVT (deep venous thrombosis) (HCC) 04/09/2015   left leg- recently   Heart failure (HCC)    Pneumonia 13 years ago   Presence of internal carotid stent    Prostate CA (HCC)    Rheumatoid arthritis (HCC)     Social History   Tobacco Use   Smoking status: Former  Types: Cigars    Quit date: 01/27/2012    Years since quitting: 11.0    Passive exposure: Past   Smokeless tobacco: Never   Tobacco comments:    cigars twice a week  Vaping Use   Vaping status: Never Used  Substance Use Topics   Alcohol use: Not Currently   Drug use: No    Family History  Problem Relation Age of Onset   Heart disease Mother    Anemia Father    Multiple sclerosis Brother     Scheduled Meds:  ascorbic acid  500 mg Oral Daily   Chlorhexidine Gluconate Cloth  6 each Topical Daily   cholecalciferol  1,000 Units Oral Daily   hydroxychloroquine  200 mg Oral Daily   metoprolol succinate  12.5 mg Oral BID   pantoprazole  40 mg Oral Daily   potassium chloride SA  20 mEq Oral Daily   predniSONE  15 mg Oral Daily   pregabalin  100 mg Oral TID   valACYclovir  500 mg Oral Daily   Continuous Infusions:  ceFEPime (MAXIPIME) IV Stopped (02/16/23 2046)   lactated ringers 50 mL/hr at 02/16/23 2124   metronidazole 500 mg (02/16/23 2116)   vancomycin Stopped (02/16/23 0953)   PRN Meds:.acetaminophen **OR** acetaminophen, HYDROmorphone (DILAUDID) injection, ondansetron **OR** ondansetron (ZOFRAN) IV, polyethylene glycol, sodium chloride flush Allergies  Allergen Reactions   Iodinated Contrast Media Rash    Can tolerate IV dye with steroid protocol    OBJECTIVE: Blood pressure 118/64, pulse 79, temperature 98.6 F (37 C), temperature source Oral, resp. rate 18, height 5\' 8"  (1.727 m), weight 60 kg, SpO2 100%.  Physical Exam Constitutional:      General: He is not in acute distress.    Appearance: He is normal weight. He is not toxic-appearing.  HENT:     Head: Normocephalic and atraumatic.     Right Ear: External ear normal.     Left Ear: External ear normal.     Nose: No congestion or rhinorrhea.     Mouth/Throat:     Mouth: Mucous membranes are moist.     Pharynx: Oropharynx is clear.  Eyes:     Extraocular Movements: Extraocular movements intact.     Conjunctiva/sclera: Conjunctivae normal.     Pupils: Pupils are equal, round, and reactive to light.  Cardiovascular:     Rate and Rhythm: Normal rate and regular rhythm.     Heart sounds: No murmur heard.    No friction rub. No gallop.  Pulmonary:     Effort: Pulmonary effort is normal.     Breath sounds: Normal breath sounds.  Abdominal:     General: Abdomen is flat. Bowel sounds are normal.      Palpations: Abdomen is soft.  Musculoskeletal:        General: No swelling. Normal range of motion.     Cervical back: Normal range of motion and neck supple.  Skin:    General: Skin is warm and dry.  Neurological:     General: No focal deficit present.     Mental Status: He is oriented to person, place, and time.  Psychiatric:        Mood and Affect: Mood normal.     Lab Results Lab Results  Component Value Date   WBC 5.4 02/16/2023   HGB 7.4 (L) 02/16/2023   HCT 23.2 (L) 02/16/2023   MCV 128.1 (H) 02/16/2023   PLT 48 (L) 02/16/2023    Lab Results  Component  Value Date   CREATININE 1.16 02/16/2023   BUN 20 02/16/2023   NA 138 02/16/2023   K 3.9 02/16/2023   CL 108 02/16/2023   CO2 21 (L) 02/16/2023    Lab Results  Component Value Date   ALT 143 (H) 02/16/2023   AST 68 (H) 02/16/2023   ALKPHOS 48 02/16/2023   BILITOT 10.0 (H) 02/16/2023       Danelle Earthly, MD [facility] 02/17/2023, 12:13 AM I have personally spent 84 minutes involved in face-to-face and non-face-to-face activities for this patient on the day of the visit. Professional time spent includes the following activities: Preparing to see the patient (review of tests), Obtaining and/or reviewing separately obtained history (admission/discharge record), Performing a medically appropriate examination and/or evaluation , Ordering medications/tests/procedures, referring and communicating with other health care professionals, Documenting clinical information in the EMR, Independently interpreting results (not separately reported), Communicating results to the patient/family/caregiver, Counseling and educating the patient/family/caregiver and Care coordination (not separately reported).

## 2023-02-17 NOTE — Progress Notes (Signed)
   02/17/23 1520  Mobility  Activity Dangled on edge of bed  Level of Assistance Moderate assist, patient does 50-74% (+2)  Assistive Device Other (Comment) (bedrails)  Activity Response Tolerated fair  Mobility Referral Yes  $Mobility charge 1 Mobility  Mobility Specialist Start Time (ACUTE ONLY) 1454  Mobility Specialist Stop Time (ACUTE ONLY) 1520  Mobility Specialist Time Calculation (min) (ACUTE ONLY) 26 min   Mobility Specialist: Progress Note  During Mobility: SpO2 97% RA  Pt requested mobility session - received in bed. Required ModA+2 for bed mobility using bed rails. C/o weakness and fatigue, stating "I need to lay back down." Returned to bed with all needs met - call bell within reach. Wife and RN present.  Pt is motivated to mobilize.  Barnie Mort, BS Mobility Specialist Please contact via SecureChat or Rehab office at [phone].

## 2023-02-17 NOTE — Progress Notes (Signed)
Pharmacy Antibiotic Note  David Joseph is a 82 y.o. male admitted on 02/15/2023 with bacteremia.  Pharmacy has been consulted for Cefazolin dosing. WBC 5.4 . Renal function ok.   Plan: Vancomycin and Cefepime DC'd by ID Start Cefazolin 2g IV q8h Continue Flagyl per ID  Height: 5\' 8"  (172.7 cm) Weight: 60 kg (132 lb 4.4 oz) IBW/kg (Calculated) : 68.4  Temp (24hrs), Avg:98.3 F (36.8 C), Min:97.8 F (36.6 C), Max:98.8 F (37.1 C)  Recent Labs  Lab 02/15/23 0728 02/15/23 0857 02/16/23 0755  WBC 8.5  --  5.4  CREATININE 1.00  --  1.16  LATICACIDVEN 8.0* 3.1* 1.8    Estimated Creatinine Clearance: 43.1 mL/min (by C-G formula based on SCr of 1.16 mg/dL).    Allergies  Allergen Reactions   Iodinated Contrast Media Rash    Can tolerate IV dye with steroid protocol    Abran Duke, PharmD, BCPS Clinical Pharmacist Phone: [phone]

## 2023-02-17 NOTE — Plan of Care (Signed)
  Problem: Education: Goal: Knowledge of General Education information will improve Description: Including pain rating scale, medication(s)/side effects and non-pharmacologic comfort measures Outcome: Progressing   Problem: Coping: Goal: Level of anxiety will decrease Outcome: Progressing   Problem: Elimination: Goal: Will not experience complications related to urinary retention Outcome: Progressing   Problem: Pain Management: Goal: General experience of comfort will improve Outcome: Progressing   Problem: Safety: Goal: Ability to remain free from injury will improve Outcome: Progressing   Problem: Skin Integrity: Goal: Risk for impaired skin integrity will decrease Outcome: Progressing

## 2023-02-17 NOTE — Evaluation (Signed)
Physical Therapy Evaluation Patient Details Name: David Joseph MRN: [id number] DOB: 03/19/1940 Today's Date: 02/17/2023  History of Present Illness  82 yo male [facility] on [facility]20 with generalized weakness and lower right sided abdominal pain x6 months. CT of abdomen showed possible acute diverticulitis and gallbladder with sludge and some gallbladder wall thickening. Pt also with L residual limb infection, + MSSA Bacteremia. 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  Clinical Impression   Pt presents with generalized weakness, impaired balance with recent history of falls, impaired mobility, and decreased activity tolerance. Pt to benefit from acute PT to address deficits. Pt requiring min physical assist for bed mobility tasks, unable to progress beyond this given fatigue. Patient will benefit from continued inpatient follow up therapy, <3 hours/day. PT to progress mobility as tolerated, and will continue to follow acutely.          If plan is discharge home, recommend the following: A lot of help with walking and/or transfers;A lot of help with bathing/dressing/bathroom   Can travel by private vehicle   No    Equipment Recommendations None recommended by PT  Recommendations for Other Services       Functional Status Assessment Patient has had a recent decline in their functional status and demonstrates the ability to make significant improvements in function in a reasonable and predictable amount of time.     Precautions / Restrictions Precautions Precautions: Fall Restrictions Weight Bearing Restrictions: No      Mobility  Bed Mobility Overal bed mobility: Needs Assistance Bed Mobility: Supine to Sit, Sit to Supine     Supine to sit: Min assist, HOB elevated, Used rails Sit to supine: Min assist, HOB elevated   General bed mobility comments: assist for trunk  elevation and lowering, pt tolerating long sitting x5 minutes only and declines EOB given fatigue    Transfers                        Ambulation/Gait                  Stairs            Wheelchair Mobility     Tilt Bed    Modified Rankin (Stroke Patients Only)       Balance Overall balance assessment: Needs assistance, History of Falls Sitting-balance support: No upper extremity supported, Feet supported Sitting balance-Leahy Scale: Fair Sitting balance - Comments: can long sit without PT support       Standing balance comment: nt - bilat BKA with no prostheses present (doesn't typically don them)                             Pertinent Vitals/Pain Pain Assessment Pain Assessment: No/denies pain    Home Living Family/patient expects to be discharged to:: Private residence Living Arrangements: Spouse/significant other Available Help at Discharge: Family;Available PRN/intermittently Type of Home: House Home Access: Ramped entrance       Home Layout: One level Home Equipment: Agricultural consultant (2 wheels);Wheelchair - power;Grab bars - toilet;Grab bars - tub/shower;BSC/3in1;Toilet riser      Prior Function Prior Level of Function : Needs assist             Mobility Comments: has been transfer level only to/from electric wheelchair. uses slide board for all transfers ADLs Comments: aide comes  and helps him bathe 1x/week. pt uses urinal to urinate, goes to [facility] sitting over commode for BM     Extremity/Trunk Assessment   Upper Extremity Assessment Upper Extremity Assessment: Defer to OT evaluation    Lower Extremity Assessment Lower Extremity Assessment: Generalized weakness (bilat BKA)    Cervical / Trunk Assessment Cervical / Trunk Assessment: Kyphotic  Communication   Communication Communication: Hearing impairment Cueing Techniques: Verbal cues;Gestural cues  Cognition Arousal: Alert Behavior During Therapy: WFL for  tasks assessed/performed Overall Cognitive Status: History of cognitive impairments - at baseline                                 General Comments: pleasant and appropriate during session, per chart review pt with some level of confusion at baseline        General Comments General comments (skin integrity, edema, etc.): bruising noted to R residual limb from recent fall    Exercises     Assessment/Plan    PT Assessment Patient needs continued PT services  PT Problem List Decreased strength;Decreased balance;Decreased range of motion;Decreased activity tolerance;Decreased skin integrity;Decreased safety awareness       PT Treatment Interventions DME instruction;Therapeutic activities;Patient/family education;Balance training;Therapeutic exercise;Functional mobility training    PT Goals (Current goals can be found in the Care Plan section)  Acute Rehab PT Goals Patient Stated Goal: home if possible PT Goal Formulation: With patient Time For Goal Achievement: 03/03/23 Potential to Achieve Goals: Good    Frequency Min 1X/week     Co-evaluation               AM-PAC PT "6 Clicks" Mobility  Outcome Measure Help needed turning from your back to your side while in a flat bed without using bedrails?: A Little Help needed moving from lying on your back to sitting on the side of a flat bed without using bedrails?: A Lot Help needed moving to and from a bed to a chair (including a wheelchair)?: Total Help needed standing up from a chair using your arms (e.g., wheelchair or bedside chair)?: Total Help needed to walk in hospital room?: Total Help needed climbing 3-5 steps with a railing? : Total 6 Click Score: 9    End of Session   Activity Tolerance: Patient limited by fatigue Patient left: in bed;with call bell/phone within reach;with bed alarm set Nurse Communication: Mobility status PT Visit Diagnosis: Other abnormalities of gait and mobility (R26.89)     Time: 4782-9562 PT Time Calculation (min) (ACUTE ONLY): 18 min   Charges:   PT Evaluation $PT Eval Low Complexity: 1 Low   PT General Charges $$ ACUTE PT VISIT: 1 Visit         Marye Round, PT DPT Acute Rehabilitation Services Secure Chat Preferred  Office [phone]   Truddie Coco 02/17/2023, 4:56 PM

## 2023-02-17 NOTE — Progress Notes (Signed)
  Echocardiogram 2D Echocardiogram has been performed.  David Joseph 02/17/2023, 11:32 AM

## 2023-02-17 NOTE — Consult Note (Signed)
WOC Nurse Consult Note: Reason for Consult: Consult requested for left stump wounds.  Pt is followed as an outpatient by the wound care center at [facility]. They are using Silver alginate.  Left lower full thickness wound is almost healed, pink and dry, 1X1X.1cm, no drainage. Left upper stump full thickness wound is healing; 3X2X.1cm, small amt tan drainage, dark red wound bed.  Topical treatment orders provided for bedside nurses to perform as was the previous plan of care: Cut piece of Aquacel [place] # [phone]) and tuck over left upper stump wound Q day, then cover with foam dressing.  Change foam dressing Q 3 days or PRN soiling. Pt plans to continue follow-up with the outpatient wound center after discharge.  Please re-consult if further assistance is needed.  Thank-you,  Cammie Mcgee MSN, RN, CWOCN, [place], CNS [phone]

## 2023-02-17 NOTE — Progress Notes (Signed)
[facility] Progress Note     Subjective: CC: reports mild nausea, denies abdominal pain. Significant other at bedside.  His vascular surgeon for L BKA w/ non-healing wound is at [facility] facility, so is his rheumatologist (Dr. Lorre Munroe) Has a history of a single episode of afib last year, now s/p ablation.   Objective: Vital signs in last 24 hours: Temp:  [97.8 F (36.6 C)-98.8 F (37.1 C)] 98.2 F (36.8 C) (12/02 0716) Pulse Rate:  [71-79] 74 (12/02 0716) Resp:  [16-18] 16 (12/02 0716) BP: (88-118)/(56-64) 107/57 (12/02 0716) SpO2:  [99 %-100 %] 100 % (12/02 0716) Last BM Date : 02/14/23  Intake/Output from previous day: 12/01 0701 - 12/02 0700 In: 2219.8 [P.O.:220; I.V.:925.8; Blood:374; IV Piggyback:700] Out: 700 [Urine:700] Intake/Output this shift: No intake/output data recorded.  PE: Gen:  resting comfortably, appears chronically ill, appears jaundiced  Card:  Regular rate and rhythm Pulm:  Normal effort ORA; port R chest wall without cellulitis  Abd: Soft, non-tender, non-distended Skin: warm and dry, no rashes  MSK:  LLE BKA with clean dressing.  Psych: A&Ox3   Lab Results:  Recent Labs    02/16/23 0755 02/16/23 1624 02/17/23 0159  WBC 5.4  --  6.9  HGB 6.2* 7.4* 7.5*  HCT 19.6* 23.2* 23.1*  PLT 48*  --  38*   BMET Recent Labs    02/16/23 0755 02/17/23 0159  NA 138 134*  K 3.9 4.3  CL 108 105  CO2 21* 21*  GLUCOSE 142* 188*  BUN 20 27*  CREATININE 1.16 1.00  CALCIUM 7.8* 7.7*   PT/INR Recent Labs    02/15/23 0728  LABPROT 22.0*  INR 1.9*   CMP     Component Value Date/Time   NA 134 (L) 02/17/2023 0159   K 4.3 02/17/2023 0159   CL 105 02/17/2023 0159   CO2 21 (L) 02/17/2023 0159   GLUCOSE 188 (H) 02/17/2023 0159   BUN 27 (H) 02/17/2023 0159   CREATININE 1.00 02/17/2023 0159   CALCIUM 7.7 (L) 02/17/2023 0159   PROT 5.1 (L) 02/17/2023 0159   ALBUMIN 2.3 (L) 02/17/2023 0159   AST 72 (H) 02/17/2023 0159   ALT 150 (H)  02/17/2023 0159   ALKPHOS 50 02/17/2023 0159   BILITOT 10.2 (H) 02/17/2023 0159   GFRNONAA >60 02/17/2023 0159   GFRAA >60 10/11/2015 0416   Lipase  No results found for: "LIPASE"     Studies/Results: US Abdomen Limited RUQ (LIVER/GB)  Result Date: 02/15/2023 CLINICAL DATA:  Fever.  Cholelithiasis. EXAM: ULTRASOUND ABDOMEN LIMITED RIGHT UPPER QUADRANT COMPARISON:  None Available. FINDINGS: Gallbladder: Gallbladder is completely filled with numerous echogenic, shadowing stones resulting in a wall-echo-shadow sign. The visualized gallbladder wall is mildly thickened. No sonographic Murphy sign noted by sonographer. Common bile duct: Diameter: 4 mm. Liver: No focal lesion identified. Within normal limits in parenchymal echogenicity. Portal vein is patent on color Doppler imaging with normal direction of blood flow towards the liver. Other: Right pleural effusion. IMPRESSION: 1. Cholelithiasis with mild gallbladder wall thickening. No sonographic Murphy sign noted by sonographer. Findings are equivocal for acute cholecystitis. A nuclear medicine HIDA scan could be obtained if further assessment is clinically warranted. 2. Right pleural effusion. Electronically Signed   By: Duanne Guess D.O.   On: 02/15/2023 10:26    Anti-infectives: Anti-infectives (From admission, onward)    Start     Dose/Rate Route Frequency Ordered Stop   02/17/23 0200  ceFAZolin (ANCEF) IVPB 1 g/50 mL premix  Status:  Discontinued        1 g 100 mL/hr over 30 Minutes Intravenous Every 8 hours 02/17/23 0059 02/17/23 0059   02/17/23 0200  ceFAZolin (ANCEF) IVPB 2g/100 mL premix        2 g 200 mL/hr over 30 Minutes Intravenous Every 8 hours 02/17/23 0059     02/16/23 0800  vancomycin (VANCOCIN) IVPB 1000 mg/200 mL premix  Status:  Discontinued        1,000 mg 200 mL/hr over 60 Minutes Intravenous Every 24 hours 02/15/23 0855 02/17/23 0059   02/15/23 2000  ceFEPIme (MAXIPIME) 2 g in sodium chloride 0.9 % 100 mL IVPB   Status:  Discontinued        2 g 200 mL/hr over 30 Minutes Intravenous Every 12 hours 02/15/23 0855 02/17/23 0059   02/15/23 2000  metroNIDAZOLE (FLAGYL) IVPB 500 mg        500 mg 100 mL/hr over 60 Minutes Intravenous Every 12 hours 02/15/23 1204 02/22/23 1959   02/15/23 1215  hydroxychloroquine (PLAQUENIL) tablet 200 mg        200 mg Oral Daily 02/15/23 1204     02/15/23 1215  valACYclovir (VALTREX) tablet 500 mg        500 mg Oral Daily 02/15/23 1204     02/15/23 0700  ceFEPIme (MAXIPIME) 2 g in sodium chloride 0.9 % 100 mL IVPB        2 g 200 mL/hr over 30 Minutes Intravenous  Once 02/15/23 0658 02/15/23 0856   02/15/23 0700  metroNIDAZOLE (FLAGYL) IVPB 500 mg        500 mg 100 mL/hr over 60 Minutes Intravenous  Once 02/15/23 0658 02/15/23 0856   02/15/23 0700  vancomycin (VANCOCIN) IVPB 1000 mg/200 mL premix        1,000 mg 200 mL/hr over 60 Minutes Intravenous  Once 02/15/23 0658 02/15/23 0856        Assessment/Plan  82 y/o male with MMP listed below admitted with acute on chronic anemia, elevated LFTs, and possible diverticulitis based on CT.   - benign abdominal exam so low suspicion for diverticulitis at this point.  - indirect hyperbilirubinemia, normal alk phos argues against choledocholithiasis/symptomatic cholelithiasis as source of elevated LFTs. appreciate GI eval. I do not feel HIDA is indicated as I have a very low suspicion for cholecystitis based on lack of RUQ pain, and no convincing signs of cholecystis on CT scan. He is immunosuppressed so WBC/fever not reliable. - will follow up heme-onc recommendations, consult is pending - since our team saw the patient yesterday his blood cx show a MSSA bacteremia. Will discuss port removal with my attending. May need a platelet transfusion prior to this procedure. May be able to remove port as early as tomorrow 12/3.   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  This care required moderate level of medical decision making.   Hosie Spangle, PA-C [facility] Please see Amion for pager number during day hours 7:00am-4:30pm

## 2023-02-17 NOTE — Progress Notes (Signed)
[facility]  Date of Admission:  02/15/2023      Total days of antibiotics 2   Cefazolin   Metronidazole           ASSESSMENT: David Joseph is a 82 y.o. male admitted with:   MSSA Bacteremia - Port-A-Cath in situ -  BCx (+) 11/30, (-) pending 12/01. Port being removed tomorrow 12/03 due to concern for seeding with virulent organism in blood. He will also need a TEE to investigate possibility of SBE - TTE scheduled today. Presumably from chronic wound on stump that he has been receiving treatment for at [facility] wound team.  -FU TTE - TEE scheduled for Wednesday 12/4 -Follow blood culture to maturity  -Please hold on any central line at the moment.  -Continue Cefazolin IV   Abnormal CT Abdomen -  Stranding adjacent to colon ?diverticulitis, enlarged GB with large amount of biliary sludge. Surgery team following and don't feel diverticulitis is of clinical concern. Indirecte hyperbilirubinemia may be due more to s/e from Reblozyl. GI following as well. HIDA was considered but low suspicion and lack of RUQ pain with ultrasound.  -Continue metronidazole for now.  -FU GI and Gen Surge recs  BKA Stump Wounds, Chronic non healing -  Described to be full thickness, healing and small amount of tan drainage. No significant pain.  -Follow WOC recommendations  -Presumably the initial source of entry  Generalized Discomfort / Soreness -  Patient and family requested PT evaluation to help mobilize and get him out of bed. Will place order. D/W Dr. Hanley Ben They would like to defer any analgesics at this time. Offered tylenol    PLAN: PT eval Continue Cefazolin Continue metronidazole for now  Follow TTE, TEE requested  Hold on PICC line - follow repeated BCx from 12/01   Principal Problem:   Cholecystitis Active Problems:   OA (osteoarthritis) of knee   Rheumatoid arthritis (HCC)   Chronic systolic congestive heart failure (HCC)   Anemia   S/P  unilateral BKA (below knee amputation), right (HCC)   Severe sepsis (HCC)   MSSA bacteremia    ascorbic acid  500 mg Oral Daily   Chlorhexidine Gluconate Cloth  6 each Topical Daily   cholecalciferol  1,000 Units Oral Daily   hydroxychloroquine  200 mg Oral Daily   metoprolol succinate  12.5 mg Oral BID   pantoprazole  40 mg Oral Daily   potassium chloride SA  20 mEq Oral Daily   predniSONE  15 mg Oral Daily   pregabalin  100 mg Oral TID   valACYclovir  500 mg Oral Daily    SUBJECTIVE: Feeling generally sore - no localizing symptoms but feels like it's from lying in bed in one position.   Review of Systems: Review of Systems  Constitutional:  Negative for chills, fever, malaise/fatigue and weight loss.  HENT:  Negative for sore throat.        No dental problems  Respiratory:  Negative for cough and sputum production.   Cardiovascular:  Negative for chest pain and leg swelling.  Gastrointestinal:  Negative for abdominal pain, diarrhea and vomiting.  Genitourinary:  Negative for dysuria and flank pain.  Musculoskeletal:  Negative for joint pain, myalgias and neck pain.       Generalized soreness and stiffness all over, nonfocal  Skin:  Negative for rash.  Neurological:  Negative for dizziness, tingling and headaches.  Psychiatric/Behavioral:  Negative for depression and  substance abuse. The patient is not nervous/anxious and does not have insomnia.     Allergies  Allergen Reactions   Iodinated Contrast Media Rash    Can tolerate IV dye with steroid protocol    OBJECTIVE: Vitals:   02/16/23 1656 02/16/23 2022 02/17/23 0500 02/17/23 0716  BP: 111/60 118/64 (!) 108/56 (!) 107/57  Pulse: 74 79 72 74  Resp: 18  18 16   Temp: 98.2 F (36.8 C) 98.6 F (37 C) 98.3 F (36.8 C) 98.2 F (36.8 C)  TempSrc: Oral Oral Oral Oral  SpO2: 100% 100% 99% 100%  Weight:      Height:       Body mass index is 20.11 kg/m.   Physical Exam Constitutional:      Appearance: Normal  appearance. He is not ill-appearing.  Cardiovascular:     Rate and Rhythm: Normal rate and regular rhythm.     Heart sounds: No murmur heard. Pulmonary:     Effort: Pulmonary effort is normal. No respiratory distress.     Breath sounds: Normal breath sounds.  Skin:    General: Skin is warm and dry.     Capillary Refill: Capillary refill takes less than 2 seconds.  Neurological:     Mental Status: He is alert and oriented to person, place, and time.      Lab Results Lab Results  Component Value Date   WBC 6.9 02/17/2023   HGB 7.5 (L) 02/17/2023   HCT 23.1 (L) 02/17/2023   MCV 117.9 (H) 02/17/2023   PLT 38 (L) 02/17/2023    Lab Results  Component Value Date   CREATININE 1.00 02/17/2023   BUN 27 (H) 02/17/2023   NA 134 (L) 02/17/2023   K 4.3 02/17/2023   CL 105 02/17/2023   CO2 21 (L) 02/17/2023    Lab Results  Component Value Date   ALT 150 (H) 02/17/2023   AST 72 (H) 02/17/2023   ALKPHOS 50 02/17/2023   BILITOT 10.2 (H) 02/17/2023     Microbiology: Recent Results (from the past 240 hour(s))  Blood Culture (routine x 2)     Status: Abnormal (Preliminary result)   Collection Time: 02/15/23  7:02 AM   Specimen: BLOOD LEFT FOREARM  Result Value Ref Range Status   Specimen Description   Final    BLOOD LEFT FOREARM BOTTLES DRAWN AEROBIC AND ANAEROBIC Performed at [facility], [address], [place], [place] [address]    Special Requests   Final    Blood Culture results may not be optimal due to an inadequate volume of blood received in culture bottles Performed at [facility], [address], [place], [place] [address]    Culture  Setup Time   Final    GRAM POSITIVE COCCI ANAEROBIC BOTTLE ONLY Gram Stain Report Called to,Read Back By and Verified With: S RICH AT 0752 ON [id number] BY S DALTON CRITICAL VALUE NOTED.  VALUE IS CONSISTENT WITH PREVIOUSLY REPORTED AND CALLED VALUE. Performed at [facility], [address], [place], [place] [address]    Culture  STAPHYLOCOCCUS AUREUS (A)  Final   Report Status PENDING  Incomplete  Resp panel by RT-PCR (RSV, Flu A&B, Covid) Anterior Nasal Swab     Status: None   Collection Time: 02/15/23  7:17 AM   Specimen: Anterior Nasal Swab  Result Value Ref Range Status   SARS Coronavirus 2 by RT PCR NEGATIVE NEGATIVE Final    Comment: (NOTE) SARS-CoV-2 target nucleic acids are NOT DETECTED.  The SARS-CoV-2 RNA is generally detectable in upper respiratory specimens during the acute phase of infection. The lowest concentration of SARS-CoV-2 viral copies this assay can detect is 138 copies/mL. A negative result does not preclude SARS-Cov-2 infection and should not be used as the sole basis for treatment or other patient management decisions. A negative result may occur with  improper specimen collection/handling, submission of specimen other than nasopharyngeal swab, presence of viral mutation(s) within the areas targeted by this assay, and inadequate number of viral copies(<138 copies/mL). A negative result must be combined with clinical observations, patient history, and epidemiological information. The expected result is Negative.  Fact Sheet for Patients:  BloggerCourse.com  Fact Sheet for Healthcare Providers:  SeriousBroker.it  This test is no t yet approved or cleared by the [place] FDA and  has been authorized for detection and/or diagnosis of SARS-CoV-2 by FDA under an Emergency Use Authorization (EUA). This EUA will remain  in effect (meaning this test can be used) for the duration of the COVID-19 declaration under Section 564(b)(1) of the Act, 21 U.S.C.section 360bbb-3(b)(1), unless the authorization is terminated  or revoked sooner.       Influenza A by PCR NEGATIVE NEGATIVE Final   Influenza B by PCR NEGATIVE NEGATIVE Final    Comment: (NOTE) The Xpert Xpress SARS-CoV-2/FLU/RSV plus assay is intended as an aid in the diagnosis of  influenza from Nasopharyngeal swab specimens and should not be used as a sole basis for treatment. Nasal washings and aspirates are unacceptable for Xpert Xpress SARS-CoV-2/FLU/RSV testing.  Fact Sheet for Patients: BloggerCourse.com  Fact Sheet for Healthcare Providers: SeriousBroker.it  This test is not yet approved or cleared by the [place] FDA and has been authorized for detection and/or diagnosis of SARS-CoV-2 by FDA under an Emergency Use Authorization (EUA). This EUA will remain in effect (meaning this test can be used) for the duration of the COVID-19 declaration under Section 564(b)(1) of the Act, 21 U.S.C. section 360bbb-3(b)(1), unless the authorization is terminated or revoked.     Resp Syncytial Virus by PCR NEGATIVE NEGATIVE Final    Comment: (NOTE) Fact Sheet for Patients: BloggerCourse.com  Fact Sheet for Healthcare Providers: SeriousBroker.it  This test is not yet approved or cleared by the [place] FDA and has been authorized for detection and/or diagnosis of SARS-CoV-2 by FDA under an Emergency Use Authorization (EUA). This EUA will remain in effect (meaning this test can be used) for the duration of the COVID-19 declaration under Section 564(b)(1) of the Act, 21 U.S.C. section 360bbb-3(b)(1), unless the authorization is terminated or revoked.  Performed at [facility], [address] [place], [place] [address]   Blood Culture (routine x 2)     Status: Abnormal (Preliminary result)   Collection Time: 02/15/23  7:28 AM   Specimen: BLOOD RIGHT ARM  Result Value Ref Range Status   Specimen Description   Final    BLOOD RIGHT ARM BOTTLES DRAWN AEROBIC AND ANAEROBIC Performed at [facility], [address], [place], [place] [address]    Special Requests   Final    Blood Culture adequate volume Performed at [facility], [address],  [place], [place] [address]    Culture  Setup Time   Final    GRAM POSITIVE COCCI ANAEROBIC BOTTLE ONLY Gram Stain Report Called to,Read Back By and Verified With: S RICH AT 0752 ON [id number] BY S DALTON CRITICAL RESULT CALLED TO, READ BACK BY AND VERIFIED WITH: PHARMD JESSICA MILLEN [id number] AT 1414  BY EC    Culture (A)  Final    STAPHYLOCOCCUS AUREUS SUSCEPTIBILITIES TO FOLLOW Performed at [facility], [address], [place], [place] [address]    Report Status PENDING  Incomplete  Blood Culture ID Panel (Reflexed)     Status: Abnormal   Collection Time: 02/15/23  7:28 AM  Result Value Ref Range Status   Enterococcus faecalis NOT DETECTED NOT DETECTED Final   Enterococcus Faecium NOT DETECTED NOT DETECTED Final   Listeria monocytogenes NOT DETECTED NOT DETECTED Final   Staphylococcus species DETECTED (A) NOT DETECTED Final    Comment: CRITICAL RESULT CALLED TO, READ BACK BY AND VERIFIED WITH: PHARMD JESSICA MILLEN [id number] AT 1414 BY EC    Staphylococcus aureus (BCID) DETECTED (A) NOT DETECTED Final    Comment: CRITICAL RESULT CALLED TO, READ BACK BY AND VERIFIED WITH: PAHRAM JESSIC MILLEN [id number] AT 1414 BY EC    Staphylococcus epidermidis NOT DETECTED NOT DETECTED Final   Staphylococcus lugdunensis NOT DETECTED NOT DETECTED Final   Streptococcus species NOT DETECTED NOT DETECTED Final   Streptococcus agalactiae NOT DETECTED NOT DETECTED Final   Streptococcus pneumoniae NOT DETECTED NOT DETECTED Final   Streptococcus pyogenes NOT DETECTED NOT DETECTED Final   A.calcoaceticus-baumannii NOT DETECTED NOT DETECTED Final   Bacteroides fragilis NOT DETECTED NOT DETECTED Final   Enterobacterales NOT DETECTED NOT DETECTED Final   Enterobacter cloacae complex NOT DETECTED NOT DETECTED Final   Escherichia coli NOT DETECTED NOT DETECTED Final   Klebsiella aerogenes NOT DETECTED NOT DETECTED Final   Klebsiella oxytoca NOT DETECTED NOT DETECTED Final   Klebsiella pneumoniae NOT  DETECTED NOT DETECTED Final   Proteus species NOT DETECTED NOT DETECTED Final   Salmonella species NOT DETECTED NOT DETECTED Final   Serratia marcescens NOT DETECTED NOT DETECTED Final   Haemophilus influenzae NOT DETECTED NOT DETECTED Final   Neisseria meningitidis NOT DETECTED NOT DETECTED Final   Pseudomonas aeruginosa NOT DETECTED NOT DETECTED Final   Stenotrophomonas maltophilia NOT DETECTED NOT DETECTED Final   Candida albicans NOT DETECTED NOT DETECTED Final   Candida auris NOT DETECTED NOT DETECTED Final   Candida glabrata NOT DETECTED NOT DETECTED Final   Candida krusei NOT DETECTED NOT DETECTED Final   Candida parapsilosis NOT DETECTED NOT DETECTED Final   Candida tropicalis NOT DETECTED NOT DETECTED Final   Cryptococcus neoformans/gattii NOT DETECTED NOT DETECTED Final   Meth resistant mecA/C and MREJ NOT DETECTED NOT DETECTED Final    Comment: Performed at [facility], [address] [place], [place] [address]  MRSA Next Gen by PCR, Nasal     Status: None   Collection Time: 02/15/23 11:03 AM   Specimen: Nasal Mucosa; Nasal Swab  Result Value Ref Range Status   MRSA by PCR Next Gen NOT DETECTED NOT DETECTED Final    Comment: (NOTE) The GeneXpert MRSA Assay (FDA approved for NASAL specimens only), is one component of a comprehensive MRSA colonization surveillance program. It is not intended to diagnose MRSA infection nor to guide or monitor treatment for MRSA infections. Test performance is not FDA approved in patients less than 58 years old. Performed at [facility], [address], [place], [place] [address]   Culture, blood (Routine X 2) w Reflex to ID Panel     Status: None (Preliminary result)   Collection Time: 02/17/23  5:42 AM   Specimen: BLOOD  Result Value Ref Range Status   Specimen Description BLOOD LEFT ANTECUBITAL  Final   Special Requests  Final    BOTTLES DRAWN AEROBIC AND ANAEROBIC Blood Culture results may not be optimal due to an inadequate  volume of blood received in culture bottles   Culture   Final    NO GROWTH <12 HOURS Performed at [facility], [address], [place], [place] [address]    Report Status PENDING  Incomplete  Culture, blood (Routine X 2) w Reflex to ID Panel     Status: None (Preliminary result)   Collection Time: 02/17/23  5:43 AM   Specimen: BLOOD LEFT FOREARM  Result Value Ref Range Status   Specimen Description BLOOD LEFT FOREARM  Final   Special Requests   Final    BOTTLES DRAWN AEROBIC AND ANAEROBIC Blood Culture results may not be optimal due to an inadequate volume of blood received in culture bottles   Culture   Final    NO GROWTH <12 HOURS Performed at [facility], [address], [place], [place] [address]    Report Status PENDING  Incomplete     Rexene Alberts, MSN, NP-C [facility]  [email or web address] Pager: [phone] Office: [phone] [facility] Main Line: [phone] *Secure Chat Communication Welcome

## 2023-02-17 NOTE — Progress Notes (Addendum)
PROGRESS NOTE    David Joseph  AOZ:[id number] DOB: 02-Feb-1941 DOA: 02/15/2023 PCP: Donetta Potts, MD   Brief Narrative:  82 y.o. 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  On presentation, he was febrile to 101.4 with initial lactic acid of 8 which improved to 3 after IV fluids.  AST of 116 and ALT of 182 with total bilirubin of 12.8.  Influenza/COVID-19/RSV PCR negative.  CT of abdomen showed possible acute diverticulitis and gallbladder with sludge and some gallbladder wall thickening.  Ultrasound abdomen equivocal for acute cholecystitis and negative Murphy sign.  He was started on IV antibiotics.  General surgery at [facility] recommended HIDA scan and if positive, consider cholecystostomy drain.  He was transferred to [facility] for the same.  GI and general surgery were consulted at [facility].  ID was auto consulted for MSSA bacteremia.  Assessment & Plan:   Possible acute cholecystitis with abdominal pain and hyperbilirubinemia and elevated LFTs -Imaging and labs as above.   -General surgery at [facility] recommended HIDA scan and if positive, consider cholecystostomy drain.  He was transferred to [facility] for the same.   -Continue antibiotics as per ID recommendations. -Blood cultures as below. -General Surgery evaluation appreciated: General surgery not convinced that patient has acute cholecystitis.  HIDA scan canceled by general surgery.  GI following as well.  Possible acute diverticulitis -As seen on imaging.  GI is not convinced that patient has diverticulitis.  Continue broad-spectrum antibiotics.  MSSA  bacteremia -Antibiotics have been switched to IV Ancef and Flagyl by ID.  Possible source from left stump wound.  Also has Port-A-Cath which needs removal.  Follow repeat blood cultures from today.  Follow TTE.  Lactic acidosis -Possibly from above.  Improving  Thrombocytopenia -Patient has chronic thrombocytopenia but has worsened since admission.  Platelets 38 today.  Will consult hematology oncology  Acute metabolic acidosis -Mild.  Monitor.  Diabetes mellitus type 2 with hyperglycemia -CBC with SSI.  Metformin on hold  Paroxysmal A-fib -Mild intermittent tachycardia present.  Eliquis on hold.  Heparin drip held because of anemia and thrombocytopenia.  Myelodysplastic syndrome -Patient had Reblozyl injection on 02/11/2023.  Follows with oncology at [facility].  Anemia of chronic disease -From chronic illnesses.  Hemoglobin dropped to 6.2 on 02/16/2023.  Status post 1 unit packed red cell transfusion.  Hemoglobin 7.5 today.  Will consult heme-onc.  Chronic systolic heart failure -Currently compensated.  Strict input and output and daily weights.  Continue metoprolol succinate.  Outpatient follow-up with cardiology  CPPD/RA -On Plaquenil and prednisone.  Outpatient follow-up with rheumatology  Ramsay Hunt syndrome -Continue Valtrex daily  Bilateral BKA with history of PAD    DVT prophylaxis: Heparin drip held  Code Status: Full Family Communication: Wife at bedside Disposition Plan: Status is: Inpatient Remains inpatient appropriate because: Of severity of illness   Consultants: General surgery/GI/ID. Consult Hemonc  procedures: None  Antimicrobials:  Anti-infectives (From admission, onward)    Start     Dose/Rate Route Frequency Ordered Stop   02/17/23 0200  ceFAZolin (ANCEF) IVPB 1 g/50 mL premix  Status:  Discontinued        1 g 100 mL/hr over 30 Minutes Intravenous Every 8  hours 02/17/23 0059 02/17/23 0059   02/17/23 0200  ceFAZolin (ANCEF) IVPB 2g/100 mL premix         2 g 200 mL/hr over 30 Minutes Intravenous Every 8 hours 02/17/23 0059     02/16/23 0800  vancomycin (VANCOCIN) IVPB 1000 mg/200 mL premix  Status:  Discontinued        1,000 mg 200 mL/hr over 60 Minutes Intravenous Every 24 hours 02/15/23 0855 02/17/23 0059   02/15/23 2000  ceFEPIme (MAXIPIME) 2 g in sodium chloride 0.9 % 100 mL IVPB  Status:  Discontinued        2 g 200 mL/hr over 30 Minutes Intravenous Every 12 hours 02/15/23 0855 02/17/23 0059   02/15/23 2000  metroNIDAZOLE (FLAGYL) IVPB 500 mg        500 mg 100 mL/hr over 60 Minutes Intravenous Every 12 hours 02/15/23 1204 02/22/23 1959   02/15/23 1215  hydroxychloroquine (PLAQUENIL) tablet 200 mg        200 mg Oral Daily 02/15/23 1204     02/15/23 1215  valACYclovir (VALTREX) tablet 500 mg        500 mg Oral Daily 02/15/23 1204     02/15/23 0700  ceFEPIme (MAXIPIME) 2 g in sodium chloride 0.9 % 100 mL IVPB        2 g 200 mL/hr over 30 Minutes Intravenous  Once 02/15/23 0658 02/15/23 0856   02/15/23 0700  metroNIDAZOLE (FLAGYL) IVPB 500 mg        500 mg 100 mL/hr over 60 Minutes Intravenous  Once 02/15/23 0658 02/15/23 0856   02/15/23 0700  vancomycin (VANCOCIN) IVPB 1000 mg/200 mL premix        1,000 mg 200 mL/hr over 60 Minutes Intravenous  Once 02/15/23 0658 02/15/23 0856         Subjective: Patient seen and examined at bedside.  Denies worsening abdominal pain, vomiting.  No fever, chest pain or shortness of breath reported.  Objective: Vitals:   02/16/23 1656 02/16/23 2022 02/17/23 0500 02/17/23 0716  BP: 111/60 118/64 (!) 108/56 (!) 107/57  Pulse: 74 79 72 74  Resp: 18  18 16   Temp: 98.2 F (36.8 C) 98.6 F (37 C) 98.3 F (36.8 C) 98.2 F (36.8 C)  TempSrc: Oral Oral Oral Oral  SpO2: 100% 100% 99% 100%  Weight:      Height:        Intake/Output Summary (Last 24 hours) at 02/17/2023 0824 Last data filed at 02/17/2023 0540 Gross per 24 hour  Intake 2219.83 ml  Output 700 ml  Net 1519.83 ml   Filed  Weights   02/15/23 0647  Weight: 60 kg    Examination:  General: Currently on 2 L oxygen via nasal cannula.  Remains jaundiced.  No distress.  Looks chronically ill and deconditioned. ENT/neck: No thyromegaly.  JVD is not elevated  respiratory: Decreased breath sounds at bases bilaterally with some crackles; no wheezing  CVS: S1-S2 heard, rate controlled currently Abdominal: Soft, mildly tender, slightly distended; no organomegaly, bowel sounds are heard Extremities: Bilateral BKA present  CNS: Alert; slow to respond.  Poor historian.  No focal neurologic deficit.  Lymph: No obvious lymphadenopathy Skin: No obvious ecchymosis/lesions  psych: Not agitated currently.  Flat affect mostly.   Musculoskeletal: No obvious joint swelling/deformity   Data Reviewed: I have personally reviewed following labs and imaging studies  CBC: Recent Labs  Lab 02/15/23 0728 02/16/23 0755 02/16/23 1624 02/17/23 0159  WBC 8.5 5.4  --  6.9  NEUTROABS 7.6  --   --   --   HGB 7.4* 6.2* 7.4* 7.5*  HCT 24.6* 19.6* 23.2* 23.1*  MCV 137.4* 128.1*  --  117.9*  PLT 103* 48*  --  38*   Basic Metabolic Panel: Recent Labs  Lab 02/15/23 0728 02/16/23 0755 02/17/23 0159  NA 138 138 134*  K 4.8 3.9 4.3  CL 104 108 105  CO2 18* 21* 21*  GLUCOSE 185* 142* 188*  BUN 23 20 27*  CREATININE 1.00 1.16 1.00  CALCIUM 8.6* 7.8* 7.7*  MG  --  2.0 2.0  PHOS 4.7*  --   --    GFR: Estimated Creatinine Clearance: 50 mL/min (by C-G formula based on SCr of 1 mg/dL). Liver Function Tests: Recent Labs  Lab 02/15/23 0728 02/16/23 0755 02/17/23 0159  AST 116* 68* 72*  ALT 182* 143* 150*  ALKPHOS 60 48 50  BILITOT 12.8* 10.0* 10.2*  PROT 6.1* 4.9* 5.1*  ALBUMIN 3.2* 2.4* 2.3*   No results for input(s): "LIPASE", "AMYLASE" in the last 168 hours. Recent Labs  Lab 02/15/23 0728  AMMONIA 19   Coagulation Profile: Recent Labs  Lab 02/15/23 0728  INR 1.9*   Cardiac Enzymes: No results for input(s):  "CKTOTAL", "CKMB", "CKMBINDEX", "TROPONINI" in the last 168 hours. BNP (last 3 results) No results for input(s): "PROBNP" in the last 8760 hours. HbA1C: No results for input(s): "HGBA1C" in the last 72 hours. CBG: Recent Labs  Lab 02/15/23 1315 02/15/23 1647  GLUCAP 119* 122*   Lipid Profile: No results for input(s): "CHOL", "HDL", "LDLCALC", "TRIG", "CHOLHDL", "LDLDIRECT" in the last 72 hours. Thyroid Function Tests: No results for input(s): "TSH", "T4TOTAL", "FREET4", "T3FREE", "THYROIDAB" in the last 72 hours. Anemia Panel: Recent Labs    02/15/23 1206  VITAMINB12 1,212*  FOLATE 19.8  FERRITIN 2,166*  TIBC 216*  IRON 74  RETICCTPCT 4.9*   Sepsis Labs: Recent Labs  Lab 02/15/23 0728 02/15/23 0857 02/16/23 0755  LATICACIDVEN 8.0* 3.1* 1.8    Recent Results (from the past 240 hour(s))  Blood Culture (routine x 2)     Status: Abnormal (Preliminary result)   Collection Time: 02/15/23  7:02 AM   Specimen: BLOOD LEFT FOREARM  Result Value Ref Range Status   Specimen Description   Final    BLOOD LEFT FOREARM BOTTLES DRAWN AEROBIC AND ANAEROBIC Performed at [facility], [address], [place], [place] [address]    Special Requests   Final    Blood Culture results may not be optimal due to an inadequate volume of blood received in culture bottles Performed at [facility], [address], [place], [place] [address]    Culture  Setup Time   Final    GRAM POSITIVE COCCI ANAEROBIC BOTTLE ONLY Gram Stain Report Called to,Read Back By and Verified With: S RICH AT 0752 ON [id number] BY S DALTON CRITICAL VALUE NOTED.  VALUE IS CONSISTENT WITH PREVIOUSLY REPORTED AND CALLED VALUE. Performed at [facility], [address], [place], [place] [address]    Culture STAPHYLOCOCCUS AUREUS (A)  Final   Report Status PENDING  Incomplete  Resp panel by RT-PCR (RSV, Flu A&B, Covid) Anterior Nasal Swab     Status: None   Collection Time: 02/15/23  7:17 AM   Specimen: Anterior  Nasal Swab  Result Value Ref Range Status   SARS Coronavirus 2 by RT PCR NEGATIVE NEGATIVE Final    Comment: (NOTE) SARS-CoV-2 target nucleic acids are NOT DETECTED.  The  SARS-CoV-2 RNA is generally detectable in upper respiratory specimens during the acute phase of infection. The lowest concentration of SARS-CoV-2 viral copies this assay can detect is 138 copies/mL. A negative result does not preclude SARS-Cov-2 infection and should not be used as the sole basis for treatment or other patient management decisions. A negative result may occur with  improper specimen collection/handling, submission of specimen other than nasopharyngeal swab, presence of viral mutation(s) within the areas targeted by this assay, and inadequate number of viral copies(<138 copies/mL). A negative result must be combined with clinical observations, patient history, and epidemiological information. The expected result is Negative.  Fact Sheet for Patients:  BloggerCourse.com  Fact Sheet for Healthcare Providers:  SeriousBroker.it  This test is no t yet approved or cleared by the [place] FDA and  has been authorized for detection and/or diagnosis of SARS-CoV-2 by FDA under an Emergency Use Authorization (EUA). This EUA will remain  in effect (meaning this test can be used) for the duration of the COVID-19 declaration under Section 564(b)(1) of the Act, 21 U.S.C.section 360bbb-3(b)(1), unless the authorization is terminated  or revoked sooner.       Influenza A by PCR NEGATIVE NEGATIVE Final   Influenza B by PCR NEGATIVE NEGATIVE Final    Comment: (NOTE) The Xpert Xpress SARS-CoV-2/FLU/RSV plus assay is intended as an aid in the diagnosis of influenza from Nasopharyngeal swab specimens and should not be used as a sole basis for treatment. Nasal washings and aspirates are unacceptable for Xpert Xpress SARS-CoV-2/FLU/RSV testing.  Fact Sheet for  Patients: BloggerCourse.com  Fact Sheet for Healthcare Providers: SeriousBroker.it  This test is not yet approved or cleared by the [place] FDA and has been authorized for detection and/or diagnosis of SARS-CoV-2 by FDA under an Emergency Use Authorization (EUA). This EUA will remain in effect (meaning this test can be used) for the duration of the COVID-19 declaration under Section 564(b)(1) of the Act, 21 U.S.C. section 360bbb-3(b)(1), unless the authorization is terminated or revoked.     Resp Syncytial Virus by PCR NEGATIVE NEGATIVE Final    Comment: (NOTE) Fact Sheet for Patients: BloggerCourse.com  Fact Sheet for Healthcare Providers: SeriousBroker.it  This test is not yet approved or cleared by the [place] FDA and has been authorized for detection and/or diagnosis of SARS-CoV-2 by FDA under an Emergency Use Authorization (EUA). This EUA will remain in effect (meaning this test can be used) for the duration of the COVID-19 declaration under Section 564(b)(1) of the Act, 21 U.S.C. section 360bbb-3(b)(1), unless the authorization is terminated or revoked.  Performed at [facility], [address] [place], [place] [address]   Blood Culture (routine x 2)     Status: Abnormal (Preliminary result)   Collection Time: 02/15/23  7:28 AM   Specimen: BLOOD RIGHT ARM  Result Value Ref Range Status   Specimen Description   Final    BLOOD RIGHT ARM BOTTLES DRAWN AEROBIC AND ANAEROBIC Performed at [facility], [address] [place], [place] [address]    Special Requests   Final    Blood Culture adequate volume Performed at [facility], [address], [place], [place] [address]    Culture  Setup Time   Final    GRAM POSITIVE COCCI ANAEROBIC BOTTLE ONLY Gram Stain Report Called to,Read Back By and Verified With: S RICH AT 0752 ON [id number] BY S DALTON CRITICAL RESULT  CALLED TO, READ BACK BY AND VERIFIED WITH: PHARMD JESSICA MILLEN [id number] AT 1414 BY  EC    Culture (A)  Final    STAPHYLOCOCCUS AUREUS SUSCEPTIBILITIES TO FOLLOW Performed at [facility], [address] [place], [place] [address]    Report Status PENDING  Incomplete  Blood Culture ID Panel (Reflexed)     Status: Abnormal   Collection Time: 02/15/23  7:28 AM  Result Value Ref Range Status   Enterococcus faecalis NOT DETECTED NOT DETECTED Final   Enterococcus Faecium NOT DETECTED NOT DETECTED Final   Listeria monocytogenes NOT DETECTED NOT DETECTED Final   Staphylococcus species DETECTED (A) NOT DETECTED Final    Comment: CRITICAL RESULT CALLED TO, READ BACK BY AND VERIFIED WITH: PHARMD JESSICA MILLEN [id number] AT 1414 BY EC    Staphylococcus aureus (BCID) DETECTED (A) NOT DETECTED Final    Comment: CRITICAL RESULT CALLED TO, READ BACK BY AND VERIFIED WITH: PAHRAM JESSIC MILLEN [id number] AT 1414 BY EC    Staphylococcus epidermidis NOT DETECTED NOT DETECTED Final   Staphylococcus lugdunensis NOT DETECTED NOT DETECTED Final   Streptococcus species NOT DETECTED NOT DETECTED Final   Streptococcus agalactiae NOT DETECTED NOT DETECTED Final   Streptococcus pneumoniae NOT DETECTED NOT DETECTED Final   Streptococcus pyogenes NOT DETECTED NOT DETECTED Final   A.calcoaceticus-baumannii NOT DETECTED NOT DETECTED Final   Bacteroides fragilis NOT DETECTED NOT DETECTED Final   Enterobacterales NOT DETECTED NOT DETECTED Final   Enterobacter cloacae complex NOT DETECTED NOT DETECTED Final   Escherichia coli NOT DETECTED NOT DETECTED Final   Klebsiella aerogenes NOT DETECTED NOT DETECTED Final   Klebsiella oxytoca NOT DETECTED NOT DETECTED Final   Klebsiella pneumoniae NOT DETECTED NOT DETECTED Final   Proteus species NOT DETECTED NOT DETECTED Final   Salmonella species NOT DETECTED NOT DETECTED Final   Serratia marcescens NOT DETECTED NOT DETECTED Final   Haemophilus influenzae NOT  DETECTED NOT DETECTED Final   Neisseria meningitidis NOT DETECTED NOT DETECTED Final   Pseudomonas aeruginosa NOT DETECTED NOT DETECTED Final   Stenotrophomonas maltophilia NOT DETECTED NOT DETECTED Final   Candida albicans NOT DETECTED NOT DETECTED Final   Candida auris NOT DETECTED NOT DETECTED Final   Candida glabrata NOT DETECTED NOT DETECTED Final   Candida krusei NOT DETECTED NOT DETECTED Final   Candida parapsilosis NOT DETECTED NOT DETECTED Final   Candida tropicalis NOT DETECTED NOT DETECTED Final   Cryptococcus neoformans/gattii NOT DETECTED NOT DETECTED Final   Meth resistant mecA/C and MREJ NOT DETECTED NOT DETECTED Final    Comment: Performed at [facility], [address], [place], [place] [address]  MRSA Next Gen by PCR, Nasal     Status: None   Collection Time: 02/15/23 11:03 AM   Specimen: Nasal Mucosa; Nasal Swab  Result Value Ref Range Status   MRSA by PCR Next Gen NOT DETECTED NOT DETECTED Final    Comment: (NOTE) The GeneXpert MRSA Assay (FDA approved for NASAL specimens only), is one component of a comprehensive MRSA colonization surveillance program. It is not intended to diagnose MRSA infection nor to guide or monitor treatment for MRSA infections. Test performance is not FDA approved in patients less than 75 years old. Performed at [facility], [address], [place], [place] [address]   Culture, blood (Routine X 2) w Reflex to ID Panel     Status: None (Preliminary result)   Collection Time: 02/17/23  5:42 AM   Specimen: BLOOD  Result Value Ref Range Status   Specimen Description BLOOD LEFT ANTECUBITAL  Final   Special Requests  Final    BOTTLES DRAWN AEROBIC AND ANAEROBIC Blood Culture results may not be optimal due to an inadequate volume of blood received in culture bottles   Culture   Final    NO GROWTH <12 HOURS Performed at [facility], [address] [place], [place] [address]    Report Status PENDING  Incomplete  Culture, blood  (Routine X 2) w Reflex to ID Panel     Status: None (Preliminary result)   Collection Time: 02/17/23  5:43 AM   Specimen: BLOOD LEFT FOREARM  Result Value Ref Range Status   Specimen Description BLOOD LEFT FOREARM  Final   Special Requests   Final    BOTTLES DRAWN AEROBIC AND ANAEROBIC Blood Culture results may not be optimal due to an inadequate volume of blood received in culture bottles   Culture   Final    NO GROWTH <12 HOURS Performed at [facility], [address], [place], [place] [address]    Report Status PENDING  Incomplete         Radiology Studies: US Abdomen Limited RUQ (LIVER/GB)  Result Date: 02/15/2023 CLINICAL DATA:  Fever.  Cholelithiasis. EXAM: ULTRASOUND ABDOMEN LIMITED RIGHT UPPER QUADRANT COMPARISON:  None Available. FINDINGS: Gallbladder: Gallbladder is completely filled with numerous echogenic, shadowing stones resulting in a wall-echo-shadow sign. The visualized gallbladder wall is mildly thickened. No sonographic Murphy sign noted by sonographer. Common bile duct: Diameter: 4 mm. Liver: No focal lesion identified. Within normal limits in parenchymal echogenicity. Portal vein is patent on color Doppler imaging with normal direction of blood flow towards the liver. Other: Right pleural effusion. IMPRESSION: 1. Cholelithiasis with mild gallbladder wall thickening. No sonographic Murphy sign noted by sonographer. Findings are equivocal for acute cholecystitis. A nuclear medicine HIDA scan could be obtained if further assessment is clinically warranted. 2. Right pleural effusion. Electronically Signed   By: Duanne Guess D.O.   On: 02/15/2023 10:26   CT CHEST ABDOMEN PELVIS WO CONTRAST  Result Date: 02/15/2023 CLINICAL DATA:  82 year old male with history of generalized weakness. Right lower abdominal pain for the past 6 months. History of prostate cancer. * Tracking Code: BO * EXAM: CT CHEST, ABDOMEN AND PELVIS WITHOUT CONTRAST TECHNIQUE: Multidetector CT  imaging of the chest, abdomen and pelvis was performed following the standard protocol without IV contrast. RADIATION DOSE REDUCTION: This exam was performed according to the departmental dose-optimization program which includes automated exposure control, adjustment of the mA and/or kV according to patient size and/or use of iterative reconstruction technique. COMPARISON:  None Available. FINDINGS: CT CHEST FINDINGS Cardiovascular: Heart size is borderline enlarged. Trace amount of pericardial fluid and/or thickening, unlikely to be of any hemodynamic significance at this time. No pericardial calcification. There is aortic atherosclerosis, as well as atherosclerosis of the great vessels of the mediastinum and the coronary arteries, including calcified atherosclerotic plaque in the left main, left anterior descending, left circumflex and right coronary arteries. Right internal jugular single-lumen Port-A-Cath with tip terminating in the distal superior vena cava. Extensive calcifications of the aortic valve. Mediastinum/Nodes: No pathologically enlarged mediastinal or hilar lymph nodes. Please note that accurate exclusion of hilar adenopathy is limited on noncontrast CT scans. Esophagus is unremarkable in appearance. No axillary lymphadenopathy. Lungs/Pleura: Small bilateral pleural effusions lying dependently (left-greater-than-right). No confluent consolidative airspace disease. No pleural effusions. Scattered areas of mild post infectious or inflammatory scarring, most evident in the inferior segment of the lingula. No definite suspicious appearing pulmonary nodules or masses are  noted. Musculoskeletal: Orthopedic fixation hardware in the lower cervical spine incidentally noted. There are no aggressive appearing lytic or blastic lesions noted in the visualized portions of the skeleton. CT ABDOMEN PELVIS FINDINGS Hepatobiliary: Liver has a slightly shrunken appearance and nodular contour. No definite suspicious  cystic or solid hepatic lesions are confidently identified on today's noncontrast CT examination. Gallbladder is only moderately distended but is filled with amorphous high attenuation material, presumably biliary sludge. Trace volume of pericholecystic fluid. Pancreas: No definite pancreatic mass or peripancreatic fluid collections or inflammatory changes are noted on today's noncontrast CT examination. Spleen: Unremarkable. Adrenals/Urinary Tract: 4.4 cm exophytic low-attenuation lesion in the medial aspect of the upper pole of the right kidney, incompletely characterized on today's noncontrast CT examination, but statistically likely a cyst (no imaging follow-up recommended). Unenhanced appearance of the left kidney and bilateral adrenal glands is normal. No hydroureteronephrosis. Urinary bladder is normal in appearance. Stomach/Bowel: Unenhanced appearance of the stomach is normal. No pathologic dilatation of small bowel or colon. Subtle inflammatory changes are noted adjacent to the descending colon where there are multiple diverticuli, concerning for possible acute diverticulitis. No discrete diverticular abscess or signs of frank perforation or confidently identified at this time. The appendix is not confidently identified and may be surgically absent. Regardless, there are no inflammatory changes noted adjacent to the cecum to suggest the presence of an acute appendicitis at this time. Vascular/Lymphatic: Atherosclerosis in the abdominal aorta and pelvic vasculature. No definite lymphadenopathy noted in the abdomen or pelvis. Reproductive: Status post radical prostatectomy. Other: No significant volume of ascites.  No pneumoperitoneum. Musculoskeletal: There are no aggressive appearing lytic or blastic lesions noted in the visualized portions of the skeleton. IMPRESSION: 1. Subtle inflammatory changes adjacent to the descending colon, which could indicate acute diverticulitis. No diverticular abscess or  signs of frank perforation are noted at this time. 2. Gallbladder is moderately distended and filled with a large amount of biliary sludge. Trace volume of pericholecystic fluid noted. The possibility of acute cholecystitis should be considered. If there is any clinical concern for acute cholecystitis, further evaluation with right upper quadrant abdominal ultrasound would be recommended. 3. Small bilateral pleural effusions lying dependently. 4. Aortic atherosclerosis, in addition to left main and three-vessel coronary artery disease. 5. Trace amount of pericardial fluid and/or thickening, unlikely to be of hemodynamic significance at this time. 6. There are calcifications of the aortic valve. Echocardiographic correlation for evaluation of potential valvular dysfunction may be warranted if clinically indicated. 7. Morphologic changes in the liver suggestive of underlying cirrhosis. 8. Additional incidental imaging findings, as above. Electronically Signed   By: Trudie Reed M.D.   On: 02/15/2023 09:13        Scheduled Meds:  ascorbic acid  500 mg Oral Daily   Chlorhexidine Gluconate Cloth  6 each Topical Daily   cholecalciferol  1,000 Units Oral Daily   hydroxychloroquine  200 mg Oral Daily   metoprolol succinate  12.5 mg Oral BID   pantoprazole  40 mg Oral Daily   potassium chloride SA  20 mEq Oral Daily   predniSONE  15 mg Oral Daily   pregabalin  100 mg Oral TID   valACYclovir  500 mg Oral Daily   Continuous Infusions:   ceFAZolin (ANCEF) IV Stopped (02/17/23 0404)   metronidazole 500 mg (02/17/23 7829)          Glade Lloyd, MD [facility] 02/17/2023, 8:24 AM

## 2023-02-17 NOTE — TOC Initial Note (Addendum)
Transition of Care (TOC) - Initial/Assessment Note   Spoke to patient at bedside. Confirmed face sheet information   Patient from home with wife.   PCP Glynis Smiles   Patient has electric wheelchair, shower seats, grab bars at home already.   Patient states he currently has home health [facility]. NCM left message for Artavia with [facility]. Await call back. Adele Dan confirmed he is active with [facility], she will check on what services he has .   Patient follows OP at wound care center [facility]    Await PT recommendations.  Patient Details  Name: David Joseph MRN: [id number] Date of Birth: 1941/03/16  Transition of Care [facility]) CM/SW Contact:    Kingsley Plan, RN Phone Number: 02/17/2023, 4:26 PM  Clinical Narrative:                   Expected Discharge Plan: Home w Home Health Services (await PT eval)     Patient Goals and CMS Choice Patient states their goals for this hospitalization and ongoing recovery are:: to return to home          Expected Discharge Plan and Services   Discharge Planning Services: CM Consult Post Acute Care Choice:  (await PT eval) Living arrangements for the past 2 months: Single Family Home                 DME Arranged:  (await PT eval)         HH Arranged:  (see note)          Prior Living Arrangements/Services Living arrangements for the past 2 months: Single Family Home Lives with:: Spouse Patient language and need for interpreter reviewed:: Yes Do you feel safe going back to the place where you live?: Yes      Need for Family Participation in Patient Care: Yes (Comment) Care giver support system in place?: Yes (comment) Current home services: DME Criminal Activity/Legal Involvement Pertinent to Current Situation/Hospitalization: No - Comment as needed  Activities of Daily Living   ADL Screening (condition at time of admission) Independently performs ADLs?: Yes (appropriate for developmental age) Is the  patient deaf or have difficulty hearing?: Yes Does the patient have difficulty seeing, even when wearing glasses/contacts?: Yes Does the patient have difficulty concentrating, remembering, or making decisions?: No  Permission Sought/Granted   Permission granted to share information with : Yes, Verbal Permission Granted     Permission granted to share info w AGENCY: [facility]        Emotional Assessment Appearance:: Appears stated age Attitude/Demeanor/Rapport: Engaged Affect (typically observed): Accepting Orientation: : Oriented to Self, Oriented to Place, Oriented to  Time, Oriented to Situation Alcohol / Substance Use: Not Applicable Psych Involvement: No (comment)  Admission diagnosis:  Cholecystitis [K81.9] Severe sepsis (HCC) [A41.9, R65.20] Patient Active Problem List   Diagnosis Date Noted   MSSA bacteremia 02/17/2023   Bilirubinemia 02/17/2023   Elevated liver enzymes 02/17/2023   Severe sepsis (HCC) 02/16/2023   Cholecystitis 02/15/2023   PVD (peripheral vascular disease) (HCC) 04/04/2022   Insomnia 04/04/2022   Hypotension 03/26/2022   Chronic systolic congestive heart failure (HCC) 03/22/2022   Anemia 03/22/2022   S/P unilateral BKA (below knee amputation), 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 [place] [address] Phone: [phone] Fax: [phone]  [facility] - [place], [place] - [address] [place] [address] Phone: [phone] Fax: [phone]     Social Determinants of Health (SDOH) Social History: SDOH Screenings    Food Insecurity: No Food Insecurity (02/15/2023)  Housing: Low Risk  (02/15/2023)  Transportation Needs: No Transportation Needs (02/15/2023)  Utilities: Not At Risk (02/15/2023)  Alcohol Screen: Low Risk  (10/15/2021)  Depression (PHQ2-9): Low Risk  (10/15/2021)  Financial Resource Strain: Low Risk  (05/20/2022)   [facility]  Physical Activity: Inactive (10/24/2022)   Received from [facility]  Social Connections: Socially Integrated (10/15/2021)  Stress: No Stress Concern Present (10/24/2022)   Received from [facility]  Tobacco Use: Medium Risk (02/15/2023)  Health Literacy: Medium Risk (07/29/2022)   Received from [facility]   SDOH Interventions:     Readmission Risk Interventions     No data to display

## 2023-02-17 NOTE — Consult Note (Addendum)
[facility]  [phone]) [phone] [phone]    HEMATOLOGY CONSULTATION  PURPOSE OF CONSULTATION/CHIEF COMPLAINT: Worsening Anemia  Referring MD:  Glade Lloyd, MD   HPI: [name] is an 82 year old male who presented to ED with generalized weakness.  He has a history of DVT, myelodysplastic syndrome, Afib- on Eliquis, CHF, PVD, and bil BKA's.   Pt reported that he felt ill for several days with N/V/D, cough, and abdominal pain.  Patient's wife stated that he had elevated temp at home and that she found him confused in bed covered with stool and urine. In the ED, assessment was done and labs showed low hemoglobin 7.4.   PRBC transfusion was given and Hematology consult has been requested.  It is noted that patient follows with [facility] for his MDS.   ASSESSMENT AND PLAN:  Anemia, severe; Thrombocytopenia -patient reports he has received approx 8 units of prbc prior to admission.  -anemia and thrombocytopenia likely due to infectious process -unlikely Hemolysis due to normal normal labs; hapto and retic which is somewhat elevated.  Ordered additional labs for am; immature platelets, direct Coombs -Pending results. -Hgb low 6.2 on 02/15/23. Pt received PRBC transfusion yesterday. Increased to 7.5 g/dl today. -recommend PRBC transfusion for Hgb <7.0 -recommend platelets transfusion for counts <20k or <50k with active bleeding.  -continue to monitor CBCD closely  2. MDS -patient with hx of MDS for which follows with [facility]; has been seeing Dr. Glenard Haring there.  Per wife, would like to transfer management of MDS to [facility]. This will be arranged upon discharge.  -on Reblozyl injections; last administered 02/11/23.  3. Jaundice -per patient's wife, they have previously been told that jaundice is due to MDS/Anemia.  Recommend further GI eval to determine source of jaundice. May consider liver bx after acute phase of infection has resolved. Pt and wife  agreeable to further eval.  4. Afib -pt on Eliquis at home. Has been held at this time.   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   Diverticulitis    10 years ago   DVT (deep venous thrombosis) (HCC) 04/09/2015   left leg- recently   Heart failure (HCC)    Pneumonia 13 years ago   Presence of internal carotid stent    Prostate CA (HCC)    Rheumatoid arthritis (HCC)   :  Past Surgical History:  Procedure Laterality Date   ATRIAL FIBRILLATION ABLATION  02/12/2022   BACK SURGERY  03/18/1978   lower    basal cell areas removed  Sept 2013, 2012   left lower lip and chest   CERVICAL FUSION     COLONOSCOPY  10/17/2011   EYE SURGERY  2 years ago   both cataract   JOINT REPLACEMENT  March 2012, 02/2012   right knee, redo right knee   left middle finger surgery  05/17/2010   PROSTATECTOMY  12/16/2013   at [facility]   TONSILLECTOMY  60 years ago   TOTAL KNEE ARTHROPLASTY Left 10/09/2015   Procedure: LEFT TOTAL KNEE ARTHROPLASTY;  Surgeon: Ollen Gross, MD;  Location: [facility];  Service: Orthopedics;  Laterality: Left;   TOTAL KNEE REVISION  02/19/2012   Procedure: TOTAL KNEE REVISION;  Surgeon: Loanne Drilling, MD;  Location: [facility];  Service: Orthopedics;  Laterality: Right;  :  Allergies  Allergen Reactions   Iodinated Contrast Media Rash    Can tolerate IV dye with steroid protocol  :   Family History  Problem Relation Age of Onset   Heart disease Mother    Anemia Father    Multiple sclerosis Brother   :   Social History   Socioeconomic History   Marital status: Married    Spouse name: Brooks Conner   Number of children: Not on file   Years of education: 12   Highest education level: 12th grade  Occupational History   Not on file  Tobacco Use   Smoking status: Former    Types: Cigars    Quit date:  01/27/2012    Years since quitting: 11.0    Passive exposure: Past   Smokeless tobacco: Never   Tobacco comments:    cigars twice a week  Vaping Use   Vaping status: Never Used  Substance and Sexual Activity   Alcohol use: Not Currently   Drug use: No   Sexual activity: Not Currently  Other Topics Concern   Not on file  Social History Narrative   Not on file   Social Determinants of Health   Financial Resource Strain: Low Risk  (05/20/2022)   Received from [facility]   Overall Financial Resource Strain (CARDIA)    Difficulty of Paying Living Expenses: Not hard at all  Food Insecurity: No Food Insecurity (02/15/2023)   Hunger Vital Sign    Worried About Running Out of Food in the Last Year: Never true    Ran Out of Food in the Last Year: Never true  Transportation Needs: No Transportation Needs (02/15/2023)   PRAPARE - Administrator, Civil Service (Medical): No    Lack of Transportation (Non-Medical): No  Physical Activity: Inactive (10/24/2022)   Received from [facility]   Exercise Vital Sign    Days of Exercise per Week: 0 days    Minutes of Exercise per Session: 0 min  Stress: No Stress Concern Present (10/24/2022)   Received from [facility] Occupational Health - Occupational Stress Questionnaire    Feeling of Stress : Not at all  Social Connections: Socially Integrated (10/15/2021)   Social Connection and Isolation Panel [NHANES]    Frequency of Communication with Friends and Family: More than three times a week    Frequency of Social Gatherings with Friends and Family: More than three times a week    Attends Religious Services: More than 4 times per year    Active Member of [employer] or Organizations: Yes    Attends Engineer, structural: More than 4 times per year    Marital Status: Married  Catering manager Violence: Not At Risk (02/15/2023)   Humiliation, Afraid, Rape, and Kick questionnaire     Fear of Current or Ex-Partner: No    Emotionally Abused: No    Physically Abused: No    Sexually Abused: No  :   CURRENT MEDS: Current Facility-Administered Medications  Medication Dose Route Frequency Provider Last Rate Last Admin   acetaminophen (TYLENOL) tablet 650 mg  650 mg Oral Q6H PRN Sherryll Burger, Pratik D, DO       Or   acetaminophen (TYLENOL) suppository 650 mg  650 mg Rectal Q6H PRN Sherryll Burger, Pratik D, DO       ascorbic acid (VITAMIN C) tablet 500 mg  500  mg Oral Daily Maurilio Lovely D, DO   500 mg at 02/17/23 0820   ceFAZolin (ANCEF) IVPB 2g/100 mL premix  2 g Intravenous Q8H Stevphen Rochester, RPH   Stopped at 02/17/23 0404   Chlorhexidine Gluconate Cloth 2 % PADS 6 each  6 each Topical Daily Glade Lloyd, MD   6 each at 02/17/23 5366   cholecalciferol (VITAMIN D3) 25 MCG (1000 UNIT) tablet 1,000 Units  1,000 Units Oral Daily Maurilio Lovely D, DO   1,000 Units at 02/17/23 0820   HYDROmorphone (DILAUDID) injection 0.5-1 mg  0.5-1 mg Intravenous Q2H PRN Sherryll Burger, Pratik D, DO       hydroxychloroquine (PLAQUENIL) tablet 200 mg  200 mg Oral Daily Sherryll Burger, Pratik D, DO   200 mg at 02/17/23 4403   metoprolol succinate (TOPROL-XL) 24 hr tablet 12.5 mg  12.5 mg Oral BID Maurilio Lovely D, DO   12.5 mg at 02/17/23 0820   metroNIDAZOLE (FLAGYL) IVPB 500 mg  500 mg Intravenous Q12H Sherryll Burger, Pratik D, DO 100 mL/hr at 02/17/23 0822 500 mg at 02/17/23 0822   ondansetron (ZOFRAN) tablet 4 mg  4 mg Oral Q6H PRN Sherryll Burger, Pratik D, DO       Or   ondansetron [facility]) injection 4 mg  4 mg Intravenous Q6H PRN Sherryll Burger, Pratik D, DO   4 mg at 02/17/23 0411   pantoprazole (PROTONIX) EC tablet 40 mg  40 mg Oral Daily Sherryll Burger, Pratik D, DO   40 mg at 02/17/23 0820   polyethylene glycol (MIRALAX / GLYCOLAX) packet 17 g  17 g Oral Daily PRN Sherryll Burger, Pratik D, DO       potassium chloride SA (KLOR-CON M) CR tablet 20 mEq  20 mEq Oral Daily Sherryll Burger, Pratik D, DO   20 mEq at 02/17/23 0820   predniSONE (DELTASONE) tablet 15 mg  15 mg Oral Daily Sherryll Burger,  Pratik D, DO   15 mg at 02/17/23 0820   pregabalin (LYRICA) capsule 100 mg  100 mg Oral TID Maurilio Lovely D, DO   100 mg at 02/17/23 4742   sodium chloride flush (NS) 0.9 % injection 10-40 mL  10-40 mL Intracatheter PRN Glade Lloyd, MD       valACYclovir (VALTREX) tablet 500 mg  500 mg Oral Daily Sherryll Burger, Pratik D, DO   500 mg at 02/17/23 0820    REVIEW OF SYSTEMS:   Constitutional: Denies fevers, chills or abnormal night sweats Eyes: Denies blurriness of vision, double vision or watery eyes Ears, nose, mouth, throat, and face: Denies mucositis or sore throat Respiratory: +sob, +cough, dyspnea or wheezes Cardiovascular: Denies palpitation, chest discomfort or lower extremity swelling Gastrointestinal:  Denies nausea, heartburn or change in bowel habits Skin: +jaundice, Denies abnormal skin rashes Lymphatics: Denies new lymphadenopathy or easy bruising Neurological:Denies numbness, tingling or new weaknesses Behavioral/Psych: Mood is stable, no new changes  All other systems were reviewed with the patient and are negative.  PHYSICAL EXAMINATION: ECOG PERFORMANCE STATUS: 3 - Symptomatic, >50% confined to bed  Vitals:   02/17/23 0500 02/17/23 0716  BP: (!) 108/56 (!) 107/57  Pulse: 72 74  Resp: 18 16  Temp: 98.3 F (36.8 C) 98.2 F (36.8 C)  SpO2: 99% 100%   Filed Weights   02/15/23 0647  Weight: 132 lb 4.4 oz (60 kg)    GENERAL:alert, no distress and comfortable SKIN: +jaundice, skin color, texture, turgor are normal, no rashes or significant lesions EYES: normal, conjunctiva are pink and non-injected, +scleral icterus  OROPHARYNX:no exudate,  no erythema and lips, buccal mucosa, and tongue normal  NECK: supple, thyroid normal size, non-tender, without nodularity LYMPH:  no palpable lymphadenopathy in the cervical, axillary or inguinal LUNGS: clear to auscultation and percussion with normal breathing effort HEART: regular rate & rhythm and no murmurs and no lower extremity  edema ABDOMEN:abdomen soft, non-tender and normal bowel sounds Musculoskeletal: +bil BKA, no cyanosis of digits and no clubbing  PSYCH: alert & oriented x 3 with fluent speech NEURO: no focal motor/sensory deficits   LABS:  Lab Results  Component Value Date   WBC 6.9 02/17/2023   HGB 7.5 (L) 02/17/2023   HCT 23.1 (L) 02/17/2023   PLT 38 (L) 02/17/2023   GLUCOSE 188 (H) 02/17/2023   ALT 150 (H) 02/17/2023   AST 72 (H) 02/17/2023   NA 134 (L) 02/17/2023   K 4.3 02/17/2023   CL 105 02/17/2023   CREATININE 1.00 02/17/2023   BUN 27 (H) 02/17/2023   CO2 21 (L) 02/17/2023   INR 1.9 (H) 02/15/2023   HGBA1C 6.1 (H) 03/22/2022    US Abdomen Limited RUQ (LIVER/GB)  Result Date: 02/15/2023 CLINICAL DATA:  Fever.  Cholelithiasis. EXAM: ULTRASOUND ABDOMEN LIMITED RIGHT UPPER QUADRANT COMPARISON:  None Available. FINDINGS: Gallbladder: Gallbladder is completely filled with numerous echogenic, shadowing stones resulting in a wall-echo-shadow sign. The visualized gallbladder wall is mildly thickened. No sonographic Murphy sign noted by sonographer. Common bile duct: Diameter: 4 mm. Liver: No focal lesion identified. Within normal limits in parenchymal echogenicity. Portal vein is patent on color Doppler imaging with normal direction of blood flow towards the liver. Other: Right pleural effusion. IMPRESSION: 1. Cholelithiasis with mild gallbladder wall thickening. No sonographic Murphy sign noted by sonographer. Findings are equivocal for acute cholecystitis. A nuclear medicine HIDA scan could be obtained if further assessment is clinically warranted. 2. Right pleural effusion. Electronically Signed   By: Duanne Guess D.O.   On: 02/15/2023 10:26   CT CHEST ABDOMEN PELVIS WO CONTRAST  Result Date: 02/15/2023 CLINICAL DATA:  82 year old male with history of generalized weakness. Right lower abdominal pain for the past 6 months. History of prostate cancer. * Tracking Code: BO * EXAM: CT CHEST,  ABDOMEN AND PELVIS WITHOUT CONTRAST TECHNIQUE: Multidetector CT imaging of the chest, abdomen and pelvis was performed following the standard protocol without IV contrast. RADIATION DOSE REDUCTION: This exam was performed according to the departmental dose-optimization program which includes automated exposure control, adjustment of the mA and/or kV according to patient size and/or use of iterative reconstruction technique. COMPARISON:  None Available. FINDINGS: CT CHEST FINDINGS Cardiovascular: Heart size is borderline enlarged. Trace amount of pericardial fluid and/or thickening, unlikely to be of any hemodynamic significance at this time. No pericardial calcification. There is aortic atherosclerosis, as well as atherosclerosis of the great vessels of the mediastinum and the coronary arteries, including calcified atherosclerotic plaque in the left main, left anterior descending, left circumflex and right coronary arteries. Right internal jugular single-lumen Port-A-Cath with tip terminating in the distal superior vena cava. Extensive calcifications of the aortic valve. Mediastinum/Nodes: No pathologically enlarged mediastinal or hilar lymph nodes. Please note that accurate exclusion of hilar adenopathy is limited on noncontrast CT scans. Esophagus is unremarkable in appearance. No axillary lymphadenopathy. Lungs/Pleura: Small bilateral pleural effusions lying dependently (left-greater-than-right). No confluent consolidative airspace disease. No pleural effusions. Scattered areas of mild post infectious or inflammatory scarring, most evident in the inferior segment of the lingula. No definite suspicious appearing pulmonary nodules or masses are noted. Musculoskeletal: Orthopedic  fixation hardware in the lower cervical spine incidentally noted. There are no aggressive appearing lytic or blastic lesions noted in the visualized portions of the skeleton. CT ABDOMEN PELVIS FINDINGS Hepatobiliary: Liver has a slightly  shrunken appearance and nodular contour. No definite suspicious cystic or solid hepatic lesions are confidently identified on today's noncontrast CT examination. Gallbladder is only moderately distended but is filled with amorphous high attenuation material, presumably biliary sludge. Trace volume of pericholecystic fluid. Pancreas: No definite pancreatic mass or peripancreatic fluid collections or inflammatory changes are noted on today's noncontrast CT examination. Spleen: Unremarkable. Adrenals/Urinary Tract: 4.4 cm exophytic low-attenuation lesion in the medial aspect of the upper pole of the right kidney, incompletely characterized on today's noncontrast CT examination, but statistically likely a cyst (no imaging follow-up recommended). Unenhanced appearance of the left kidney and bilateral adrenal glands is normal. No hydroureteronephrosis. Urinary bladder is normal in appearance. Stomach/Bowel: Unenhanced appearance of the stomach is normal. No pathologic dilatation of small bowel or colon. Subtle inflammatory changes are noted adjacent to the descending colon where there are multiple diverticuli, concerning for possible acute diverticulitis. No discrete diverticular abscess or signs of frank perforation or confidently identified at this time. The appendix is not confidently identified and may be surgically absent. Regardless, there are no inflammatory changes noted adjacent to the cecum to suggest the presence of an acute appendicitis at this time. Vascular/Lymphatic: Atherosclerosis in the abdominal aorta and pelvic vasculature. No definite lymphadenopathy noted in the abdomen or pelvis. Reproductive: Status post radical prostatectomy. Other: No significant volume of ascites.  No pneumoperitoneum. Musculoskeletal: There are no aggressive appearing lytic or blastic lesions noted in the visualized portions of the skeleton. IMPRESSION: 1. Subtle inflammatory changes adjacent to the descending colon, which  could indicate acute diverticulitis. No diverticular abscess or signs of frank perforation are noted at this time. 2. Gallbladder is moderately distended and filled with a large amount of biliary sludge. Trace volume of pericholecystic fluid noted. The possibility of acute cholecystitis should be considered. If there is any clinical concern for acute cholecystitis, further evaluation with right upper quadrant abdominal ultrasound would be recommended. 3. Small bilateral pleural effusions lying dependently. 4. Aortic atherosclerosis, in addition to left main and three-vessel coronary artery disease. 5. Trace amount of pericardial fluid and/or thickening, unlikely to be of hemodynamic significance at this time. 6. There are calcifications of the aortic valve. Echocardiographic correlation for evaluation of potential valvular dysfunction may be warranted if clinically indicated. 7. Morphologic changes in the liver suggestive of underlying cirrhosis. 8. Additional incidental imaging findings, as above. Electronically Signed   By: Trudie Reed M.D.   On: 02/15/2023 09:13   DG Chest Port 1 View  Result Date: 02/15/2023 CLINICAL DATA:  Questionable sepsis. EXAM: PORTABLE CHEST 1 VIEW COMPARISON:  05/30/2010 FINDINGS: Cardiopericardial enlargement and aortic tortuosity. Diffuse interstitial prominence with congested appearance of central vessels and symmetric hazy density at the bases. No visible effusion or pneumothorax. Right port with tip at the SVC. IMPRESSION: Cardiomegaly and vascular congestion. Pneumonia cannot be excluded at the bases. Electronically Signed   By: Tiburcio Pea M.D.   On: 02/15/2023 07:56       The total time spent in the appointment was 40 minutes encounter with patients including review of chart and various tests results, discussions about plan of care and coordination of care plan   All questions were answered. The patient knows to call the clinic with any problems, questions or  concerns. No barriers to learning  was detected.  Thank you for the courtesy of this consultation, Dawson Bills, NP  12/2/202412:40 PM  ADDENDUM I have seen the patient, examined him. I agree with the assessment and and plan and have edited the notes.   This is a 82 year old gentleman with past medical history of low risk MDS on Reblozyl injection, DM2, CVA, CHF, bilateral BKA, rheumatoid arthritis, interstitial lung disease, A-fib on Eliquis, was admitted for possible acute cholecystitis, MSSA bacteremia, worsening hyperbilirubinemia.  His anemia and thrombocytopenia has got worse since admission, required 1 unit of blood transfusion, no bleeding so far.   I have reviewed his chart, lab work, and his peripheral blood smear today.  There is no significant schistocytes on the smear.  Although his direct bilirubin is only around 3, with total bilirubin around 10, his recent haptoglobin was normal, reticulocyte count was not high, so I do not think he has significant hemolysis.  But I will check a Coombs test tomorrow morning.  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 I will defer to GI for his work up of hyperbilirubinemia.   I think his anemia and worsening thrombocytopenia is likely related to his bacteremia, his port will be removed tomorrow which I agree.  Please continue supportive care with blood transfusion if hemoglobin less than 7.5, or platelet less than 20K.  I will also order DIC panel tomorrow.  He did not receive heparin on November 30 in November 30 first,, but platelet count dropped right after that, the timing is not consistent with HIT, I do not have clinical suspicion for TTP.  Will monitor his blood counts, and see him back as needed.  Please call us when he is ready to be discharged, and we will set up his visit with our partner at any pain hospital.  Malachy Mood MD 02/17/2023

## 2023-02-17 NOTE — Progress Notes (Addendum)
Daily Progress Note  DOA: 02/15/2023 Hospital Day: 3  Chief Complaint:  worsening anemia and elevated LFTs  ASSESSMENT    Brief Narrative:  David Joseph is a 82 y.o. 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 Admitted on 11/30 with fever, N/V, possible cholecystitis and also anemia  GI saw in consult on 12/1  Possible acute left sided diverticulitis on CT scan..Seems unlikely in absence of abdominal pain  Getting ancef and flagyl  Abnormal gallbladder on CT scan - Surgery doesn't feel this is case of acute cholecystitis.   Acute on chronic macrocytic anemia / also concurrent progression of chronic thrombocytopenia and elevated LFTs. Hemolysis?  Has MDS Baseline hgb mid 8, presented with hgb of 7.4, down to 6.2. Got 1 u PBCs on 12/1 with improvement in hgb to 7.5. Normal B12 and folate. Platelets 38, down from baseline of 140's. Hematology to evaluate  Elevated liver chemistries, mixed pattern. Elevation of total bilirubin is predominantly indirect  raising concern for  hemolysis Today:  Liver tests about the same as yesterday. Tbili 10.2, AST 72, ALT 150. Normal alk phos. Hemolysis workup in progres  MSSA Bacteremia - staph aureus, possibly related to stump wound. Possibly 2/2 to [facility] which is scheduled from removal. Echo results pending. ID is following.   Possible cirrhosis on non-contrast CT scan. No evidence for portal hypertension on imaging    Principal Problem:   Cholecystitis Active Problems:   OA (osteoarthritis) of knee   Rheumatoid arthritis (HCC)   Chronic systolic congestive heart failure (HCC)   Anemia   S/P unilateral BKA (below knee amputation), right (HCC)   Severe sepsis (HCC)   MSSA bacteremia   PLAN   --IgG, ASMA, AMA  pending --LDH is elevated,  hjaptoglobin pending --Doesn't seem to have a primary liver cause of elevated bilirubin but with obtain MRI / MRCP to evaluate bile ducts. This may also give a better idea if liver appears cirrhotic.  --Agree with Hematology consult   Subjective   No complaints other than dry mouth.  He has no abdominal pain .    Objective   Liver disease evaluation:  HCV ab negative, HAV total ab negative, In March 2024, HBV labs negative ANA negative Ferritin 2166 TIBC 215, 24 % sat Ammonia 19  Colonoscopy through [facility] November 2022 that showed only diverticulosis in the sigmoid, descending, transverse colon.   Recent Labs    02/15/23 0728 02/16/23 0755 02/16/23 1624 02/17/23 0159  WBC 8.5 5.4  --  6.9  HGB 7.4* 6.2* 7.4* 7.5*  HCT 24.6* 19.6* 23.2* 23.1*  PLT 103* 48*  --  38*   BMET Recent Labs    02/15/23 0728 02/16/23 0755 02/17/23 0159  NA 138 138 134*  K 4.8 3.9 4.3  CL 104 108 105  CO2 18* 21* 21*  GLUCOSE 185* 142* 188*  BUN 23 20 27*  CREATININE 1.00 1.16 1.00  CALCIUM 8.6* 7.8* 7.7*   LFT Recent Labs    02/16/23 1624 02/17/23 0159  PROT  --  5.1*  ALBUMIN  --  2.3*  AST  --  72*  ALT  --  150*  ALKPHOS  --  50  BILITOT  --  10.2*  BILIDIR 3.0*  --    PT/INR Recent Labs    02/15/23 1610  LABPROT 22.0*  INR 1.9*     Imaging:  US Abdomen Limited RUQ (LIVER/GB) CLINICAL DATA:  Fever.  Cholelithiasis.  EXAM: ULTRASOUND ABDOMEN LIMITED RIGHT UPPER QUADRANT  COMPARISON:  None Available.  FINDINGS: Gallbladder:  Gallbladder is completely filled with numerous echogenic, shadowing stones resulting in a wall-echo-shadow sign. The visualized gallbladder wall is mildly thickened. No sonographic Murphy sign noted by sonographer.  Common bile duct:  Diameter: 4 mm.  Liver:  No focal lesion identified. Within normal limits in parenchymal echogenicity. Portal vein is patent on color Doppler imaging with normal direction of blood flow towards the  liver.  Other: Right pleural effusion.  IMPRESSION: 1. Cholelithiasis with mild gallbladder wall thickening. No sonographic Murphy sign noted by sonographer. Findings are equivocal for acute cholecystitis. A nuclear medicine HIDA scan could be obtained if further assessment is clinically warranted. 2. Right pleural effusion.  Electronically Signed   By: Duanne Guess D.O.   On: 02/15/2023 10:26 CT CHEST ABDOMEN PELVIS WO CONTRAST CLINICAL DATA:  82 year old male with history of generalized weakness. Right lower abdominal pain for the past 6 months. History of prostate cancer. * Tracking Code: BO *  EXAM: CT CHEST, ABDOMEN AND PELVIS WITHOUT CONTRAST  TECHNIQUE: Multidetector CT imaging of the chest, abdomen and pelvis was performed following the standard protocol without IV contrast.  RADIATION DOSE REDUCTION: This exam was performed according to the departmental dose-optimization program which includes automated exposure control, adjustment of the mA and/or kV according to patient size and/or use of iterative reconstruction technique.  COMPARISON:  None Available.  FINDINGS: CT CHEST FINDINGS  Cardiovascular: Heart size is borderline enlarged. Trace amount of pericardial fluid and/or thickening, unlikely to be of any hemodynamic significance at this time. No pericardial calcification. There is aortic atherosclerosis, as well as atherosclerosis of the great vessels of the mediastinum and the coronary arteries, including calcified atherosclerotic plaque in the left main, left anterior descending, left circumflex and right coronary arteries. Right internal jugular single-lumen Port-A-Cath with tip terminating in the distal superior vena cava. Extensive calcifications of the aortic valve.  Mediastinum/Nodes: No pathologically enlarged mediastinal or hilar lymph nodes. Please note that accurate exclusion of hilar adenopathy is limited on noncontrast CT scans. Esophagus  is unremarkable in appearance. No axillary lymphadenopathy.  Lungs/Pleura: Small bilateral pleural effusions lying dependently (left-greater-than-right). No confluent consolidative airspace disease. No pleural effusions. Scattered areas of mild post infectious or inflammatory scarring, most evident in the inferior segment of the lingula. No definite suspicious appearing pulmonary nodules or masses are noted.  Musculoskeletal: Orthopedic fixation hardware in the lower cervical spine incidentally noted. There are no aggressive appearing lytic or blastic lesions noted in the visualized portions of the skeleton.  CT ABDOMEN PELVIS FINDINGS  Hepatobiliary: Liver has a slightly shrunken appearance and nodular contour. No definite suspicious cystic or solid hepatic lesions are confidently identified on today's noncontrast CT examination. Gallbladder is only moderately distended but is filled with amorphous high attenuation material, presumably biliary sludge. Trace volume of pericholecystic fluid.  Pancreas: No definite pancreatic mass or peripancreatic fluid collections or inflammatory changes are noted on today's noncontrast CT examination.  Spleen: Unremarkable.  Adrenals/Urinary Tract: 4.4 cm exophytic low-attenuation lesion in the medial aspect of the upper pole of the right kidney, incompletely characterized on today's noncontrast CT examination, but statistically likely a cyst (no imaging follow-up recommended). Unenhanced appearance of the left kidney and bilateral adrenal glands is normal. No hydroureteronephrosis. Urinary bladder is normal in appearance.  Stomach/Bowel: Unenhanced appearance  of the stomach is normal. No pathologic dilatation of small bowel or colon. Subtle inflammatory changes are noted adjacent to the descending colon where there are multiple diverticuli, concerning for possible acute diverticulitis. No discrete diverticular abscess or signs of frank  perforation or confidently identified at this time. The appendix is not confidently identified and may be surgically absent. Regardless, there are no inflammatory changes noted adjacent to the cecum to suggest the presence of an acute appendicitis at this time.  Vascular/Lymphatic: Atherosclerosis in the abdominal aorta and pelvic vasculature. No definite lymphadenopathy noted in the abdomen or pelvis.  Reproductive: Status post radical prostatectomy.  Other: No significant volume of ascites.  No pneumoperitoneum.  Musculoskeletal: There are no aggressive appearing lytic or blastic lesions noted in the visualized portions of the skeleton.  IMPRESSION: 1. Subtle inflammatory changes adjacent to the descending colon, which could indicate acute diverticulitis. No diverticular abscess or signs of frank perforation are noted at this time. 2. Gallbladder is moderately distended and filled with a large amount of biliary sludge. Trace volume of pericholecystic fluid noted. The possibility of acute cholecystitis should be considered. If there is any clinical concern for acute cholecystitis, further evaluation with right upper quadrant abdominal ultrasound would be recommended. 3. Small bilateral pleural effusions lying dependently. 4. Aortic atherosclerosis, in addition to left main and three-vessel coronary artery disease. 5. Trace amount of pericardial fluid and/or thickening, unlikely to be of hemodynamic significance at this time. 6. There are calcifications of the aortic valve. Echocardiographic correlation for evaluation of potential valvular dysfunction may be warranted if clinically indicated. 7. Morphologic changes in the liver suggestive of underlying cirrhosis. 8. Additional incidental imaging findings, as above.  Electronically Signed   By: Trudie Reed M.D.   On: 02/15/2023 09:13 DG Chest Port 1 View CLINICAL DATA:  Questionable sepsis.  EXAM: PORTABLE CHEST 1  VIEW  COMPARISON:  05/30/2010  FINDINGS: Cardiopericardial enlargement and aortic tortuosity. Diffuse interstitial prominence with congested appearance of central vessels and symmetric hazy density at the bases. No visible effusion or pneumothorax. Right port with tip at the SVC.  IMPRESSION: Cardiomegaly and vascular congestion. Pneumonia cannot be excluded at the bases.  Electronically Signed   By: Tiburcio Pea M.D.   On: 02/15/2023 07:56     Scheduled inpatient medications:   ascorbic acid  500 mg Oral Daily   Chlorhexidine Gluconate Cloth  6 each Topical Daily   cholecalciferol  1,000 Units Oral Daily   hydroxychloroquine  200 mg Oral Daily   metoprolol succinate  12.5 mg Oral BID   pantoprazole  40 mg Oral Daily   potassium chloride SA  20 mEq Oral Daily   predniSONE  15 mg Oral Daily   pregabalin  100 mg Oral TID   valACYclovir  500 mg Oral Daily   Continuous inpatient infusions:    ceFAZolin (ANCEF) IV 2 g (02/17/23 1310)   metronidazole 500 mg (02/17/23 0822)   PRN inpatient medications: acetaminophen **OR** acetaminophen, HYDROmorphone (DILAUDID) injection, ondansetron **OR** ondansetron (ZOFRAN) IV, polyethylene glycol, sodium chloride flush  Vital signs in last 24 hours: Temp:  [98.2 F (36.8 C)-98.8 F (37.1 C)] 98.2 F (36.8 C) (12/02 0716) Pulse Rate:  [71-79] 74 (12/02 0716) Resp:  [16-18] 16 (12/02 0716) BP: (107-118)/(56-64) 107/57 (12/02 0716) SpO2:  [99 %-100 %] 100 % (12/02 0716) Last BM Date : 02/14/23  Intake/Output Summary (Last 24 hours) at 02/17/2023 1334 Last data filed at 02/17/2023 0540 Gross per 24 hour  Intake 1999.83  ml  Output 700 ml  Net 1299.83 ml    Intake/Output from previous day: 12/01 0701 - 12/02 0700 In: 2219.8 [P.O.:220; I.V.:925.8; Blood:374; IV Piggyback:700] Out: 700 [Urine:700] Intake/Output this shift: No intake/output data recorded.   Physical Exam:  General: Alert male in NAD Heart:  Regular rate.   Pulmonary: Normal respiratory effort Abdomen: Soft, nondistended, mild LLQ tenderness with moderate palpation. Normal bowel sounds. Extremities: bilateral BKA Neurologic: Alert and oriented Psych: Pleasant. Cooperative. Insight appears normal.      LOS: 2 days   Willette Cluster ,NP 02/17/2023, 1:34 PM

## 2023-02-17 NOTE — Care Management Important Message (Signed)
Important Message  Patient Details  Name: LOT WILES MRN: [id number] Date of Birth: 07/01/1940   Important Message Given:  Yes - Medicare IM     Sherilyn Banker 02/17/2023, 3:25 PM

## 2023-02-18 ENCOUNTER — Inpatient Hospital Stay (HOSPITAL_COMMUNITY): Payer: Medicare Other | Admitting: Anesthesiology

## 2023-02-18 ENCOUNTER — Other Ambulatory Visit: Payer: Self-pay

## 2023-02-18 ENCOUNTER — Encounter (HOSPITAL_COMMUNITY): Payer: Self-pay | Admitting: Internal Medicine

## 2023-02-18 ENCOUNTER — Encounter (HOSPITAL_COMMUNITY)
Admission: EM | Disposition: A | Discharge status: Skilled Nursing Facility | Payer: Self-pay | Source: Home / Self Care | Attending: Family Medicine

## 2023-02-18 DIAGNOSIS — K819 Cholecystitis, unspecified: Secondary | ICD-10-CM | POA: Diagnosis not present

## 2023-02-18 DIAGNOSIS — R748 Abnormal levels of other serum enzymes: Secondary | ICD-10-CM | POA: Diagnosis not present

## 2023-02-18 DIAGNOSIS — T827XXA Infection and inflammatory reaction due to other cardiac and vascular devices, implants and grafts, initial encounter: Secondary | ICD-10-CM

## 2023-02-18 HISTORY — PX: PORT-A-CATH REMOVAL: SHX5289

## 2023-02-18 LAB — GLUCOSE, CAPILLARY
Glucose-Capillary: 142 mg/dL — ABNORMAL HIGH (ref 70–99)
Glucose-Capillary: 156 mg/dL — ABNORMAL HIGH (ref 70–99)
Glucose-Capillary: 169 mg/dL — ABNORMAL HIGH (ref 70–99)
Glucose-Capillary: 185 mg/dL — ABNORMAL HIGH (ref 70–99)
Glucose-Capillary: 136 mg/dL — ABNORMAL HIGH (ref 70–99)

## 2023-02-18 LAB — CBC
HCT: 22.5 % — ABNORMAL LOW (ref 39.0–52.0)
Hemoglobin: 7 g/dL — ABNORMAL LOW (ref 13.0–17.0)
MCH: 37.2 pg — ABNORMAL HIGH (ref 26.0–34.0)
MCHC: 31.1 g/dL (ref 30.0–36.0)
MCV: 119.7 fL — ABNORMAL HIGH (ref 80.0–100.0)
Platelets: 35 10*3/uL — ABNORMAL LOW (ref 150–400)
RBC: 1.88 MIL/uL — ABNORMAL LOW (ref 4.22–5.81)
RDW: 26.5 % — ABNORMAL HIGH (ref 11.5–15.5)
WBC: 9.8 10*3/uL (ref 4.0–10.5)
nRBC: 0.7 % — ABNORMAL HIGH (ref 0.0–0.2)

## 2023-02-18 LAB — COMPREHENSIVE METABOLIC PANEL
ALT: 114 U/L — ABNORMAL HIGH (ref 0–44)
AST: 70 U/L — ABNORMAL HIGH (ref 15–41)
Albumin: 2.3 g/dL — ABNORMAL LOW (ref 3.5–5.0)
Alkaline Phosphatase: 47 U/L (ref 38–126)
Anion gap: 7 (ref 5–15)
BUN: 26 mg/dL — ABNORMAL HIGH (ref 8–23)
CO2: 21 mmol/L — ABNORMAL LOW (ref 22–32)
Calcium: 7.7 mg/dL — ABNORMAL LOW (ref 8.9–10.3)
Chloride: 104 mmol/L (ref 98–111)
Creatinine, Ser: 1.15 mg/dL (ref 0.61–1.24)
GFR, Estimated: >60 mL/min (ref >60)
Glucose, Bld: 186 mg/dL — ABNORMAL HIGH (ref 70–99)
Potassium: 4.3 mmol/L (ref 3.5–5.1)
Sodium: 132 mmol/L — ABNORMAL LOW (ref 135–145)
Total Bilirubin: 6.3 mg/dL — ABNORMAL HIGH (ref <1.2)
Total Protein: 5 g/dL — ABNORMAL LOW (ref 6.5–8.1)

## 2023-02-18 LAB — MAGNESIUM: Magnesium: 2 mg/dL (ref 1.7–2.4)

## 2023-02-18 LAB — PREPARE RBC (CROSSMATCH)

## 2023-02-18 LAB — DIRECT ANTIGLOBULIN TEST (NOT AT ARMC)
DAT, IgG: NEGATIVE
DAT, complement: NEGATIVE

## 2023-02-18 LAB — DIC (DISSEMINATED INTRAVASCULAR COAGULATION)PANEL
D-Dimer, Quant: 5.8 ug{FEU}/mL — ABNORMAL HIGH (ref 0.00–0.50)
Fibrinogen: 271 mg/dL (ref 210–475)
INR: 1.9 — ABNORMAL HIGH (ref 0.8–1.2)
Platelets: 36 10*3/uL — ABNORMAL LOW (ref 150–400)
Prothrombin Time: 21.7 s — ABNORMAL HIGH (ref 11.4–15.2)
Smear Review: NONE SEEN
aPTT: 42 s — ABNORMAL HIGH (ref 24–36)

## 2023-02-18 LAB — C-REACTIVE PROTEIN: CRP: 9.1 mg/dL — ABNORMAL HIGH

## 2023-02-18 LAB — IMMATURE PLATELET FRACTION: Immature Platelet Fraction: 29.8 % — ABNORMAL HIGH (ref 1.2–8.6)

## 2023-02-18 SURGERY — REMOVAL PORT-A-CATH
Anesthesia: General | Site: Chest

## 2023-02-18 MED ORDER — ONDANSETRON HCL 4 MG/2ML IJ SOLN
INTRAMUSCULAR | Status: DC | PRN
  Administered 2023-02-18: 4 mg via INTRAVENOUS

## 2023-02-18 MED ORDER — LIDOCAINE 2% (20 MG/ML) 5 ML SYRINGE
INTRAMUSCULAR | Status: DC | PRN
  Administered 2023-02-18: 20 mg via INTRAVENOUS

## 2023-02-18 MED ORDER — FENTANYL CITRATE (PF) 250 MCG/5ML IJ SOLN
INTRAMUSCULAR | Status: AC
  Filled 2023-02-18: qty 5

## 2023-02-18 MED ORDER — PROPOFOL 1000 MG/100ML IV EMUL
INTRAVENOUS | Status: AC
  Filled 2023-02-18: qty 100

## 2023-02-18 MED ORDER — SODIUM CHLORIDE 0.9% IV SOLUTION: Freq: Once | INTRAVENOUS | Status: DC

## 2023-02-18 MED ORDER — CHLORHEXIDINE GLUCONATE 0.12 % MT SOLN: 15.0000 mL | Freq: Once | OROMUCOSAL | Status: AC

## 2023-02-18 MED ORDER — CHLORHEXIDINE GLUCONATE 0.12 % MT SOLN
OROMUCOSAL | Status: AC
  Administered 2023-02-18: 15 mL via OROMUCOSAL
  Filled 2023-02-18: qty 15

## 2023-02-18 MED ORDER — BUPIVACAINE-EPINEPHRINE 0.25% -1:200000 IJ SOLN
INTRAMUSCULAR | Status: DC | PRN
  Administered 2023-02-18: 10 mL

## 2023-02-18 MED ORDER — BUPIVACAINE-EPINEPHRINE (PF) 0.25% -1:200000 IJ SOLN
INTRAMUSCULAR | Status: AC
  Filled 2023-02-18: qty 30

## 2023-02-18 MED ORDER — INSULIN ASPART 100 UNIT/ML IJ SOLN
0.0000 [IU] | Freq: Every day | INTRAMUSCULAR | Status: DC
  Administered 2023-02-20: 2 [IU] via SUBCUTANEOUS
  Administered 2023-02-21 – 2023-02-22 (×2): 3 [IU] via SUBCUTANEOUS
  Administered 2023-02-23: 4 [IU] via SUBCUTANEOUS

## 2023-02-18 MED ORDER — ONDANSETRON HCL 4 MG/2ML IJ SOLN: 4.0000 mg | Freq: Once | INTRAMUSCULAR | Status: DC | PRN

## 2023-02-18 MED ORDER — LACTATED RINGERS IV SOLN: INTRAVENOUS | Status: DC

## 2023-02-18 MED ORDER — PROPOFOL 500 MG/50ML IV EMUL
INTRAVENOUS | Status: DC | PRN
  Administered 2023-02-18: 45 ug/kg/min via INTRAVENOUS

## 2023-02-18 MED ORDER — INSULIN ASPART 100 UNIT/ML IJ SOLN
0.0000 [IU] | Freq: Three times a day (TID) | INTRAMUSCULAR | Status: DC
  Administered 2023-02-18 – 2023-02-19 (×3): 3 [IU] via SUBCUTANEOUS
  Administered 2023-02-20: 5 [IU] via SUBCUTANEOUS
  Administered 2023-02-20 (×2): 3 [IU] via SUBCUTANEOUS
  Administered 2023-02-21: 8 [IU] via SUBCUTANEOUS
  Administered 2023-02-21: 3 [IU] via SUBCUTANEOUS
  Administered 2023-02-21: 8 [IU] via SUBCUTANEOUS
  Administered 2023-02-22: 3 [IU] via SUBCUTANEOUS
  Administered 2023-02-22: 5 [IU] via SUBCUTANEOUS
  Administered 2023-02-22: 8 [IU] via SUBCUTANEOUS
  Administered 2023-02-23: 2 [IU] via SUBCUTANEOUS
  Administered 2023-02-23: 3 [IU] via SUBCUTANEOUS
  Administered 2023-02-23: 8 [IU] via SUBCUTANEOUS
  Administered 2023-02-24: 2 [IU] via SUBCUTANEOUS
  Administered 2023-02-24: 8 [IU] via SUBCUTANEOUS
  Administered 2023-02-24: 5 [IU] via SUBCUTANEOUS

## 2023-02-18 MED ORDER — OXYCODONE HCL 5 MG PO TABS: 5.0000 mg | ORAL_TABLET | Freq: Once | ORAL | Status: DC | PRN

## 2023-02-18 MED ORDER — FENTANYL CITRATE (PF) 100 MCG/2ML IJ SOLN: 25.0000 ug | INTRAMUSCULAR | Status: DC | PRN

## 2023-02-18 MED ORDER — PROPOFOL 10 MG/ML IV BOLUS
INTRAVENOUS | Status: DC | PRN
  Administered 2023-02-18: 20 mg via INTRAVENOUS

## 2023-02-18 MED ORDER — FENTANYL CITRATE (PF) 250 MCG/5ML IJ SOLN
INTRAMUSCULAR | Status: DC | PRN
  Administered 2023-02-18: 25 ug via INTRAVENOUS

## 2023-02-18 MED ORDER — ORAL CARE MOUTH RINSE: 15.0000 mL | Freq: Once | OROMUCOSAL | Status: AC

## 2023-02-18 MED ORDER — PROPOFOL 10 MG/ML IV BOLUS
INTRAVENOUS | Status: AC
  Filled 2023-02-18: qty 20

## 2023-02-18 MED ORDER — SODIUM CHLORIDE 0.9% IV SOLUTION: Freq: Once | INTRAVENOUS | Status: AC

## 2023-02-18 MED ORDER — PHENYLEPHRINE HCL-NACL 20-0.9 MG/250ML-% IV SOLN
INTRAVENOUS | Status: DC | PRN
  Administered 2023-02-18: 50 ug/min via INTRAVENOUS

## 2023-02-18 MED ORDER — ACETAMINOPHEN 10 MG/ML IV SOLN: 1000.0000 mg | Freq: Once | INTRAVENOUS | Status: DC | PRN

## 2023-02-18 MED ORDER — OXYCODONE HCL 5 MG/5ML PO SOLN: 5.0000 mg | Freq: Once | ORAL | Status: DC | PRN

## 2023-02-18 MED ORDER — INSULIN ASPART 100 UNIT/ML IJ SOLN: 0.0000 [IU] | INTRAMUSCULAR | Status: DC | PRN

## 2023-02-18 SURGICAL SUPPLY — 27 items
APPLICATOR CHLORAPREP 10.5 ORG (MISCELLANEOUS) ×1 IMPLANT
BAG COUNTER SPONGE SURGICOUNT (BAG) ×1 IMPLANT
COVER SURGICAL LIGHT HANDLE (MISCELLANEOUS) ×1 IMPLANT
DERMABOND ADVANCED .7 DNX12 (GAUZE/BANDAGES/DRESSINGS) ×1 IMPLANT
DRAPE LAPAROTOMY 100X72 PEDS (DRAPES) ×1 IMPLANT
ELECT CAUTERY BLADE 6.4 (BLADE) ×1 IMPLANT
ELECT REM PT RETURN 9FT ADLT (ELECTROSURGICAL) ×1
ELECTRODE REM PT RTRN 9FT ADLT (ELECTROSURGICAL) ×1 IMPLANT
GAUZE 4X4 16PLY ~~LOC~~+RFID DBL (SPONGE) ×1 IMPLANT
GLOVE BIO SURGEON STRL SZ7 (GLOVE) ×1 IMPLANT
GLOVE BIOGEL PI IND STRL 7.5 (GLOVE) ×1 IMPLANT
GOWN STRL REUS W/ TWL LRG LVL3 (GOWN DISPOSABLE) ×2 IMPLANT
KIT BASIN OR (CUSTOM PROCEDURE TRAY) ×1 IMPLANT
KIT TURNOVER KIT B (KITS) ×1 IMPLANT
NDL HYPO 25GX1X1/2 BEV (NEEDLE) ×1 IMPLANT
NEEDLE HYPO 25GX1X1/2 BEV (NEEDLE) ×1 IMPLANT
NS IRRIG 1000ML POUR BTL (IV SOLUTION) ×1 IMPLANT
PACK GENERAL/GYN (CUSTOM PROCEDURE TRAY) ×1 IMPLANT
PAD ARMBOARD 7.5X6 YLW CONV (MISCELLANEOUS) ×2 IMPLANT
PENCIL SMOKE EVACUATOR (MISCELLANEOUS) ×1 IMPLANT
SPIKE FLUID TRANSFER (MISCELLANEOUS) ×1 IMPLANT
SUT MNCRL AB 4-0 PS2 18 (SUTURE) ×1 IMPLANT
SUT VIC AB 2-0 SH 27XBRD (SUTURE) IMPLANT
SUT VIC AB 3-0 SH 27X BRD (SUTURE) ×1 IMPLANT
SYR CONTROL 10ML LL (SYRINGE) ×1 IMPLANT
TOWEL GREEN STERILE (TOWEL DISPOSABLE) ×1 IMPLANT
TOWEL GREEN STERILE FF (TOWEL DISPOSABLE) ×1 IMPLANT

## 2023-02-18 NOTE — Plan of Care (Signed)
Pt is alert and oriented x 4. Vitals stable. Pt had 2 runs of 4 and 5 beats of vtach. Johann Capers NP aware. Pt asymptomatic. Pt remains NPO this am for procedure. MRI completed at approx 2220.  Problem: Education: Goal: Knowledge of General Education information will improve Description: Including pain rating scale, medication(s)/side effects and non-pharmacologic comfort measures Outcome: Progressing   Problem: Health Behavior/Discharge Planning: Goal: Ability to manage health-related needs will improve Outcome: Progressing   Problem: Clinical Measurements: Goal: Ability to maintain clinical measurements within normal limits will improve Outcome: Progressing Goal: Will remain free from infection Outcome: Progressing Goal: Diagnostic test results will improve Outcome: Progressing Goal: Respiratory complications will improve Outcome: Progressing Goal: Cardiovascular complication will be avoided Outcome: Progressing   Problem: Activity: Goal: Risk for activity intolerance will decrease Outcome: Progressing   Problem: Nutrition: Goal: Adequate nutrition will be maintained Outcome: Progressing   Problem: Coping: Goal: Level of anxiety will decrease Outcome: Progressing   Problem: Elimination: Goal: Will not experience complications related to bowel motility Outcome: Progressing Goal: Will not experience complications related to urinary retention Outcome: Progressing   Problem: Pain Management: Goal: General experience of comfort will improve Outcome: Progressing   Problem: Safety: Goal: Ability to remain free from injury will improve Outcome: Progressing   Problem: Skin Integrity: Goal: Risk for impaired skin integrity will decrease Outcome: Progressing

## 2023-02-18 NOTE — Anesthesia Postprocedure Evaluation (Signed)
Anesthesia Post Note  Patient: David Joseph  Procedure(s) Performed: REMOVAL PORT-A-CATH (Chest)     Patient location during evaluation: PACU Anesthesia Type: General Level of consciousness: awake and alert Pain management: pain level controlled Vital Signs Assessment: post-procedure vital signs reviewed and stable Respiratory status: spontaneous breathing, nonlabored ventilation, respiratory function stable and patient connected to nasal cannula oxygen Cardiovascular status: stable and blood pressure returned to baseline Postop Assessment: no apparent nausea or vomiting Anesthetic complications: no   No notable events documented.  Last Vitals:  Vitals:   02/18/23 1100 02/18/23 1114  BP: 122/65 112/63  Pulse: 66 66  Resp: 18 20  Temp:  36.4 C  SpO2: 100% 97%    Last Pain:  Vitals:   02/18/23 1100  TempSrc:   PainSc: 0-No pain                 Mariann Barter

## 2023-02-18 NOTE — Progress Notes (Signed)
OT Cancellation Note  Patient Details Name: David Joseph MRN: [id number] DOB: [birth year]   Cancelled Treatment:    Reason Eval/Treat Not Completed: Patient at procedure or test/ unavailable  Mateo Flow 02/18/2023, 8:37 AM

## 2023-02-18 NOTE — Progress Notes (Signed)
   [facility] has been requested to perform a transesophageal echocardiogram on David Joseph for bacteremia.      The patient has: Thrombocytopenia (Platelet count <50,000)  Cased reviewed with Dr Bjorn Pippin, will cancel TEE tomorrow. Please call back if PLT improves >50k.     Signed, Cyndi Bender, NP  02/18/2023 4:12 PM

## 2023-02-18 NOTE — Progress Notes (Signed)
CCC Pre-op Review  Pre-op checklist:  incomplete  Labs:      12/3 0304 Na 132  glu 186  bun 26  H/H  7/22.5 ( crosmmatched)** Plts  35 **   Consent:  consented   H&P:   needs H&P  Vitals:  no temp recorded 0355 12/3 p72 r 17  102/54(69)  99%  O2 requirements:  Room Air  MAR/PTA review:   IV:     24g L hand + 20g RFA  Floor nurse name:   Loetta Rough LPN  Additional info:   On Flagyl 500mg  IV q12 + Ancef 2g q8

## 2023-02-18 NOTE — Plan of Care (Signed)

## 2023-02-18 NOTE — Anesthesia Preprocedure Evaluation (Signed)
Anesthesia Evaluation  Patient identified by MRN, date of birth, ID band Patient awake    Reviewed: Allergy & Precautions, NPO status , Patient's Chart, lab work & pertinent test results, reviewed documented beta blocker date and time   History of Anesthesia Complications Negative for: history of anesthetic complications  Airway Mallampati: III  TM Distance: >3 FB     Dental no notable dental hx.    Pulmonary neg shortness of breath, neg sleep apnea, pneumonia, former smoker, neg PE   breath sounds clear to auscultation       Cardiovascular Exercise Tolerance: Poor + Peripheral Vascular Disease and +CHF  (-) CAD and (-) Past MI  Rhythm:Regular Rate:Normal  EF35-40 with mod RV dysfunction, mod MR, ASD with L>R shunt   Neuro/Psych CVA, Residual Symptoms    GI/Hepatic ,neg GERD  ,,  Endo/Other  diabetes    Renal/GU      Musculoskeletal  (+) Arthritis ,    Abdominal   Peds  Hematology  (+) Blood dyscrasia, anemia Anemia, thrombocytopenia; plts 35k   Anesthesia Other Findings   Reproductive/Obstetrics                              Anesthesia Physical Anesthesia Plan  ASA: 3  Anesthesia Plan: General   Post-op Pain Management:    Induction: Intravenous  PONV Risk Score and Plan: 2 and Ondansetron  Airway Management Planned: LMA  Additional Equipment:   Intra-op Plan:   Post-operative Plan: Extubation in OR  Informed Consent: I have reviewed the patients History and Physical, chart, labs and discussed the procedure including the risks, benefits and alternatives for the proposed anesthesia with the patient or authorized representative who has indicated his/her understanding and acceptance.     Dental advisory given  Plan Discussed with: CRNA  Anesthesia Plan Comments:          Anesthesia Quick Evaluation

## 2023-02-18 NOTE — Op Note (Signed)
   Patient: David Joseph (01-Sep-1940, [id number])  Date of Surgery: 02/18/2023  Preoperative Diagnosis: Bactermia   Postoperative Diagnosis: Bactermia   Surgical Procedure: REMOVAL PORT-A-CATH: 40347 (CPT)   Operative Team Members:  Surgeons and Role:    * Vegas Fritze, Hyman Hopes, MD - Primary   Anesthesiologist: Mariann Barter, MD CRNA: Ardean Larsen, CRNA   Anesthesia: General   Fluids:  Total I/O In: 100 [IV Piggyback:100] Out: -   Complications: None  Drains:  none   Specimen: * No specimens in log *   Disposition:  PACU - hemodynamically stable.  Plan of Care:  Continued inpatient care    Indications for Procedure: David Joseph is a 82 y.o. male who presented with multiple complaints.  Found to have bacteremia so port removal was recommended by infectious disease team..  The procedure itself as well as its risks, benefits and alternatives were discussed.  The risks discussed included but were not limited to the risk of infection, bleeding, damage to nearby structures, and hematoma.  After a full discussion and all questions answered the patient granted consent to proceed.  Findings: port removed   Description of Procedure:   On the date stated above the patient was taken the operating room suite and placed in supine position.  Monitored anesthesia care was induced.  A timeout was completed verifying the correct patient, procedure, position, and equipment needed for the case.  The patient's right chest was prepped and draped in usual sterile fashion.  I made an incision along the previous port placement incision and dissected out the port using electrocautery.  The port was grasped and elevated using a penetrating towel clamp.  3 Prolene sutures were divided to release the port from the pectoral fascia.  The port was then removed and passed off the field.  Hemostasis was obtained by suturing the tunnel from the port catheter closed using Vicryl suture.  The  subcutaneous tissues were reapproximated using 2-0 Vicryl suture.  The skin was closed using 4-0 Monocryl and Dermabond.  The patient tolerated the procedure well.  At the end of the case we reviewed the infection status of the case. Patient: [name] Patient Case: Urgent Infection Present At Time Of Surgery (PATOS):  Bacteremia  Ivar Drape, MD General, Bariatric, & Minimally Invasive Surgery [facility], [place]

## 2023-02-18 NOTE — Progress Notes (Signed)
Daily Progress Note  DOA: 02/15/2023 Hospital Day: 4  Chief Complaint: worsening anemia and elevated LFTs   ASSESSMENT    Brief Narrative:  David Joseph is a 82 y.o. 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 Admitted on 11/30 with fever, N/V, possible cholecystitis and also anemia  GI saw in consult on 12/1   Acute on chronic macrocytic anemia requiring transfusion / also concurrent progression of chronic thrombocytopenia Hematology following and thinks infection is cause for acute drop in counts and hemolysis unlikely with normal haptoglobin.   Today:  Hgb 7, not much improvement in hgb after 2 units of PBCs. Platelets 35.   MDS On Rebiozyl injections  Elevated liver chemistries, mixed pattern. Bilirubin elevation  predominantly indirect, no findings of biliary obstruction on MRCP.  Cause not clear. Hepatic serologic workup negative. May be maybe multifactorial ( ? infection, ? Heart failure,  ? DILI ). Improvement in bilirubin overnight from 10 >> 6.3    Possible cirrhosis on non-contrast CT scan. No evidence for portal hypertension on imaging   Suggestion of acute left sided diverticulitis on CT scan..Seems unlikely in absence of abdominal pain  On day 4 of antibiotics ( current getting ancef and flagyl)   Abnormal gallbladder on CT scan  Clinically doesn't appear to have acute cholecystitis.    MSSA Bacteremia - Staph aureus, possibly related to stump wound. Possibly 2/2 to [employer]  which was removed today. For TEE tomorrow. .     Principal Problem:   Cholecystitis Active Problems:   OA (osteoarthritis) of knee   Rheumatoid arthritis (HCC)   Chronic systolic congestive heart failure (HCC)   Anemia   S/P unilateral BKA (below knee amputation), right (HCC)   Severe sepsis  (HCC)   MSSA bacteremia   Bilirubinemia   Elevated liver enzymes   PLAN  --Continue to monitor LFTs for improvement as infection improves on antibiotics.  --No overt GI bleeding Hematology following. Tranfuse as needed.    Subjective   Feels very weak but no specific complaints.   Objective     Recent Labs    02/16/23 0755 02/16/23 1624 02/17/23 0159 02/18/23 0304  WBC 5.4  --  6.9 9.8  HGB 6.2* 7.4* 7.5* 7.0*  HCT 19.6* 23.2* 23.1* 22.5*  PLT 48*  --  38* 36*  35*   BMET Recent Labs    02/16/23 0755 02/17/23 0159 02/18/23 0304  NA 138 134* 132*  K 3.9 4.3 4.3  CL 108 105 104  CO2 21* 21* 21*  GLUCOSE 142* 188* 186*  BUN 20 27* 26*  CREATININE 1.16 1.00 1.15  CALCIUM 7.8* 7.7* 7.7*   LFT Recent Labs    02/16/23 1624 02/17/23 0159 02/18/23 0304  PROT  --    < > 5.0*  ALBUMIN  --    < > 2.3*  AST  --    < > 70*  ALT  --    < > 114*  ALKPHOS  --    < > 47  BILITOT  --    < > 6.3*  BILIDIR 3.0*  --   --    < > = values in this interval not displayed.   PT/INR Recent Labs    02/18/23 0304  LABPROT 21.7*  INR 1.9*     Imaging:  MR 3D Recon At Scanner CLINICAL DATA:  Jaundice, cholelithiasis  EXAM: MRI ABDOMEN WITHOUT AND WITH CONTRAST (INCLUDING MRCP)  TECHNIQUE: Multiplanar multisequence MR imaging of the abdomen was performed both before and after the administration of intravenous contrast. Heavily T2-weighted images of the biliary and pancreatic ducts were obtained, and three-dimensional MRCP images were rendered by post processing.  CONTRAST:  6mL GADAVIST GADOBUTROL 1 MMOL/ML IV SOLN  COMPARISON:  CT chest abdomen pelvis, 02/15/2023, right upper quadrant ultrasound, 02/15/2023  FINDINGS: Lower chest: Cardiomegaly.  Small bilateral pleural effusions.  Hepatobiliary: No solid liver abnormality is seen. Hepatic signal inversion on in and opposed phase imaging as well as intrinsic T2 hypointensity of the liver. The gallbladder is  filled with small gallstones (series 5, image 20). No gallbladder wall thickening or pericholecystic fluid. No biliary ductal dilatation.  Pancreas: Unremarkable. No pancreatic ductal dilatation or surrounding inflammatory changes.  Spleen: Normal in size. Splenic signal inversion on in and opposed phase imaging as well as intrinsic T2 hypointensity of the spleen.  Adrenals/Urinary Tract: Adrenal glands are unremarkable. Simple, benign bilateral renal cortical cysts, for which no further follow-up or characterization is required. Kidneys are otherwise normal, without obvious renal calculi, solid lesion, or hydronephrosis.  Stomach/Bowel: Stomach is within normal limits. No evidence of bowel wall thickening, distention, or inflammatory changes.  Vascular/Lymphatic: Aortic atherosclerosis. No enlarged abdominal lymph nodes.  Other: No abdominal wall hernia.  Anasarca.  No ascites.  Musculoskeletal: No acute or significant osseous findings.  IMPRESSION: 1. Cholelithiasis without evidence of acute cholecystitis. 2. No biliary ductal dilatation or choledocholithiasis. 3. Hepatic and splenic signal characteristics consistent with iron deposition. 4. Cardiomegaly. 5. Small bilateral pleural effusions. 6. Anasarca.  Aortic Atherosclerosis (ICD10-I70.0).  Electronically Signed   By: Jearld Lesch M.D.   On: 02/18/2023 08:28 MR ABDOMEN MRCP W WO CONTAST CLINICAL DATA:  Jaundice, cholelithiasis  EXAM: MRI ABDOMEN WITHOUT AND WITH CONTRAST (INCLUDING MRCP)  TECHNIQUE: Multiplanar multisequence MR imaging of the abdomen was performed both before and after the administration of intravenous contrast. Heavily T2-weighted images of the biliary and pancreatic ducts were obtained, and three-dimensional MRCP images were rendered by post processing.  CONTRAST:  6mL GADAVIST GADOBUTROL 1 MMOL/ML IV SOLN  COMPARISON:  CT chest abdomen pelvis, 02/15/2023, right upper quadrant  ultrasound, 02/15/2023  FINDINGS: Lower chest: Cardiomegaly.  Small bilateral pleural effusions.  Hepatobiliary: No solid liver abnormality is seen. Hepatic signal inversion on in and opposed phase imaging as well as intrinsic T2 hypointensity of the liver. The gallbladder is filled with small gallstones (series 5, image 20). No gallbladder wall thickening or pericholecystic fluid. No biliary ductal dilatation.  Pancreas: Unremarkable. No pancreatic ductal dilatation or surrounding inflammatory changes.  Spleen: Normal in size. Splenic signal inversion on in and opposed phase imaging as well as intrinsic T2 hypointensity of the spleen.  Adrenals/Urinary Tract: Adrenal glands are unremarkable. Simple, benign bilateral renal cortical cysts, for which no further follow-up or characterization is required. Kidneys are otherwise normal, without obvious renal calculi, solid lesion, or hydronephrosis.  Stomach/Bowel: Stomach is within normal limits. No evidence of bowel wall thickening, distention, or inflammatory changes.  Vascular/Lymphatic: Aortic atherosclerosis. No enlarged abdominal lymph nodes.  Other: No abdominal wall hernia.  Anasarca.  No ascites.  Musculoskeletal: No acute or significant osseous findings.  IMPRESSION: 1. Cholelithiasis without evidence of acute cholecystitis. 2. No biliary ductal dilatation or choledocholithiasis. 3. Hepatic and splenic signal characteristics consistent with iron deposition. 4. Cardiomegaly. 5. Small bilateral pleural effusions. 6. Anasarca.  Aortic Atherosclerosis (ICD10-I70.0).  Electronically Signed   By: Jearld Lesch M.D.   On: 02/18/2023 08:28     Scheduled inpatient medications:   sodium chloride   Intravenous Once   sodium chloride   Intravenous Once   ascorbic acid  500 mg Oral Daily   Chlorhexidine Gluconate Cloth  6 each Topical Daily   cholecalciferol  1,000 Units Oral Daily   hydroxychloroquine  200 mg Oral  Daily   insulin aspart  0-15 Units Subcutaneous TID WC   insulin aspart  0-5 Units Subcutaneous QHS   metoprolol succinate  12.5 mg Oral BID   pantoprazole  40 mg Oral Daily   potassium chloride SA  20 mEq Oral Daily   predniSONE  15 mg Oral Daily   pregabalin  100 mg Oral TID   valACYclovir  500 mg Oral Daily   Continuous inpatient infusions:    ceFAZolin (ANCEF) IV 2 g (02/18/23 0519)   metronidazole 500 mg (02/17/23 2329)   PRN inpatient medications: acetaminophen **OR** acetaminophen, HYDROmorphone (DILAUDID) injection, ondansetron **OR** ondansetron (ZOFRAN) IV, polyethylene glycol, sodium chloride flush  Vital signs in last 24 hours: Temp:  [97.6 F (36.4 C)-99 F (37.2 C)] 98.1 F (36.7 C) (12/03 1135) Pulse Rate:  [66-77] 66 (12/03 1135) Resp:  [16-20] 18 (12/03 1135) BP: (95-122)/(53-66) 110/61 (12/03 1135) SpO2:  [95 %-100 %] 100 % (12/03 1135) Weight:  [76.8 kg] 76.8 kg (12/03 0357) Last BM Date : 02/14/23  Intake/Output Summary (Last 24 hours) at 02/18/2023 1430 Last data filed at 02/18/2023 1052 Gross per 24 hour  Intake 474 ml  Output 650 ml  Net -176 ml    Intake/Output from previous day: 12/02 0701 - 12/03 0700 In: 120 [P.O.:120] Out: 650 [Urine:650] Intake/Output this shift: Total I/O In: 354 [Blood:254; IV Piggyback:100] Out: -    Physical Exam:  General: Alert male in NAD Heart:  Regular rate and rhythm.  Pulmonary: Normal respiratory effort Abdomen: Soft, nondistended, nontender. Normal bowel sounds. Neurologic: Alert and oriented Psych: Pleasant. Cooperative. Insight appears normal.      LOS: 3 days   Willette Cluster ,NP 02/18/2023, 2:30 PM

## 2023-02-18 NOTE — Progress Notes (Signed)
PROGRESS NOTE    David Joseph  BMW:[id number] DOB: [birth year] DOA: 02/15/2023 PCP: Donetta Potts, MD   Brief Narrative:  82 y.o. 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  On presentation, he was febrile to 101.4 with initial lactic acid of 8 which improved to 3 after IV fluids.  AST of 116 and ALT of 182 with total bilirubin of 12.8.  Influenza/COVID-19/RSV PCR negative.  CT of abdomen showed possible acute diverticulitis and gallbladder with sludge and some gallbladder wall thickening.  Ultrasound abdomen equivocal for acute cholecystitis and negative Murphy sign.  He was started on IV antibiotics.  General surgery at [facility] recommended HIDA scan and if positive, consider cholecystostomy drain.  He was transferred to [facility] for the same.  GI and general surgery were consulted at [facility].  ID was auto consulted for MSSA bacteremia.  Heme-onc was consulted on 02/17/2023.  Assessment & Plan:   Abdominal pain and hyperbilirubinemia and elevated LFTs -Imaging and labs as above.   -General surgery at [facility] recommended HIDA scan and if positive, consider cholecystostomy drain.  He was transferred to [facility] for the same.   -Continue antibiotics as per ID recommendations. -Blood cultures as below. -General Surgery following: General surgery not convinced that patient has acute cholecystitis.  HIDA scan canceled by general surgery.  GI following as well.  MRCP pending. -LFTs improving as well including total bilirubin.  Possible acute diverticulitis -As seen on imaging.  GI is not convinced that patient has  diverticulitis.  Continue current antibiotics.  MSSA bacteremia -Antibiotics have been switched to IV Ancef and Flagyl by ID.  Possible source from left stump wound.  Also has Port-A-Cath which needs removal.  General surgery planning for port removal today.  Follow repeat blood cultures from 02/17/2023.  Cardiology planning for TEE tomorrow.  Lactic acidosis -Possibly from above.  Improving  Thrombocytopenia -Patient has chronic thrombocytopenia but has worsened since admission.  Platelets 35 today.  Heme-onc following.  Acute metabolic acidosis -Mild.  Monitor.  Diabetes mellitus type 2 with hyperglycemia -CBC with SSI.  Metformin on hold  Paroxysmal A-fib -Rate currently controlled.  Eliquis on hold.  Heparin drip held because of anemia and thrombocytopenia.  Myelodysplastic syndrome -Patient had Reblozyl injection on 02/11/2023.  Follows with oncology at [facility].  Anemia of chronic disease -From chronic illnesses.  Hemoglobin dropped to 6.2 on 02/16/2023.  Status post 1 unit packed red cell transfusion.  Hemoglobin 7 today.  Transfuse 1 unit packed red cells.  Heme-onc following.  Chronic systolic heart failure -Currently compensated.  Strict input and output and daily weights.  Continue metoprolol succinate.  Outpatient follow-up with cardiology  CPPD/RA -On Plaquenil and prednisone.  Outpatient follow-up with rheumatology  Ramsay Hunt syndrome -Continue Valtrex daily  Bilateral BKA with history of PAD    DVT prophylaxis: Heparin drip held  Code Status: Full Family Communication: Wife at bedside Disposition Plan: Status is: Inpatient Remains inpatient appropriate because: Of severity of illness   Consultants: General surgery/GI/ID. Hemonc  procedures: None  Antimicrobials:  Anti-infectives (From admission, onward)    Start     Dose/Rate Route Frequency Ordered Stop   02/17/23 0200  ceFAZolin (ANCEF) IVPB 1  g/50 mL premix  Status:  Discontinued        1 g 100  mL/hr over 30 Minutes Intravenous Every 8 hours 02/17/23 0059 02/17/23 0059   02/17/23 0200  ceFAZolin (ANCEF) IVPB 2g/100 mL premix        2 g 200 mL/hr over 30 Minutes Intravenous Every 8 hours 02/17/23 0059     02/16/23 0800  vancomycin (VANCOCIN) IVPB 1000 mg/200 mL premix  Status:  Discontinued        1,000 mg 200 mL/hr over 60 Minutes Intravenous Every 24 hours 02/15/23 0855 02/17/23 0059   02/15/23 2000  ceFEPIme (MAXIPIME) 2 g in sodium chloride 0.9 % 100 mL IVPB  Status:  Discontinued        2 g 200 mL/hr over 30 Minutes Intravenous Every 12 hours 02/15/23 0855 02/17/23 0059   02/15/23 2000  metroNIDAZOLE (FLAGYL) IVPB 500 mg        500 mg 100 mL/hr over 60 Minutes Intravenous Every 12 hours 02/15/23 1204 02/22/23 1959   02/15/23 1215  hydroxychloroquine (PLAQUENIL) tablet 200 mg        200 mg Oral Daily 02/15/23 1204     02/15/23 1215  valACYclovir (VALTREX) tablet 500 mg        500 mg Oral Daily 02/15/23 1204     02/15/23 0700  ceFEPIme (MAXIPIME) 2 g in sodium chloride 0.9 % 100 mL IVPB        2 g 200 mL/hr over 30 Minutes Intravenous  Once 02/15/23 0658 02/15/23 0856   02/15/23 0700  metroNIDAZOLE (FLAGYL) IVPB 500 mg        500 mg 100 mL/hr over 60 Minutes Intravenous  Once 02/15/23 0658 02/15/23 0856   02/15/23 0700  vancomycin (VANCOCIN) IVPB 1000 mg/200 mL premix        1,000 mg 200 mL/hr over 60 Minutes Intravenous  Once 02/15/23 1610 02/15/23 0856         Subjective: Patient seen and examined at bedside.  Denies worsening abdominal, fever.  No chest pain or shortness of breath reported. Objective: Vitals:   02/17/23 1606 02/17/23 2006 02/18/23 0355 02/18/23 0357  BP: (!) 95/57 (!) 110/53 (!) 102/54   Pulse: 74 76 72   Resp: 16 18 17    Temp: 99 F (37.2 C) 98.5 F (36.9 C) 98.4 F (36.9 C)   TempSrc: Oral Oral Oral   SpO2: 97% 95% 99%   Weight:    76.8 kg  Height:        Intake/Output Summary (Last 24 hours) at 02/18/2023 0803 Last data filed at  02/17/2023 2329 Gross per 24 hour  Intake 120 ml  Output 650 ml  Net -530 ml   Filed Weights   02/15/23 0647 02/18/23 0357  Weight: 60 kg 76.8 kg    Examination:  General: No acute distress.  On room air.  Looks chronically ill and deconditioned. ENT/neck: No JVD elevation or palpable neck masses noted  respiratory: Bilateral decreased breath sounds at bases with scattered crackles CVS: Rate currently controlled; S1 and S2 are heard Abdominal: Soft, slightly tender and distended; no organomegaly, bowel sounds are heard normally Extremities: Has bilateral BKA CNS: Awake; still slow to respond.  Poor historian.  No focal deficits noted  lymph: No palpable lymphadenopathy Skin: No obvious rashes/petechiae  psych: Affect is mostly flat.  Currently not agitated. Musculoskeletal: No obvious other joint/tenderness   Data Reviewed: I have personally reviewed following labs and imaging studies  CBC: Recent Labs  Lab 02/15/23 0728 02/16/23 0755 02/16/23 1624 02/17/23 0159 02/18/23 0304  WBC 8.5 5.4  --  6.9 9.8  NEUTROABS 7.6  --   --   --   --   HGB 7.4* 6.2* 7.4* 7.5* 7.0*  HCT 24.6* 19.6* 23.2* 23.1* 22.5*  MCV 137.4* 128.1*  --  117.9* 119.7*  PLT 103* 48*  --  38* 36*  35*   Basic Metabolic Panel: Recent Labs  Lab 02/15/23 0728 02/16/23 0755 02/17/23 0159 02/18/23 0304  NA 138 138 134* 132*  K 4.8 3.9 4.3 4.3  CL 104 108 105 104  CO2 18* 21* 21* 21*  GLUCOSE 185* 142* 188* 186*  BUN 23 20 27* 26*  CREATININE 1.00 1.16 1.00 1.15  CALCIUM 8.6* 7.8* 7.7* 7.7*  MG  --  2.0 2.0 2.0  PHOS 4.7*  --   --   --    GFR: Estimated Creatinine Clearance: 49.6 mL/min (by C-G formula based on SCr of 1.15 mg/dL). Liver Function Tests: Recent Labs  Lab 02/15/23 0728 02/16/23 0755 02/17/23 0159 02/18/23 0304  AST 116* 68* 72* 70*  ALT 182* 143* 150* 114*  ALKPHOS 60 48 50 47  BILITOT 12.8* 10.0* 10.2* 6.3*  PROT 6.1* 4.9* 5.1* 5.0*  ALBUMIN 3.2* 2.4* 2.3* 2.3*    No results for input(s): "LIPASE", "AMYLASE" in the last 168 hours. Recent Labs  Lab 02/15/23 0728  AMMONIA 19   Coagulation Profile: Recent Labs  Lab 02/15/23 0728 02/18/23 0304  INR 1.9* 1.9*   Cardiac Enzymes: No results for input(s): "CKTOTAL", "CKMB", "CKMBINDEX", "TROPONINI" in the last 168 hours. BNP (last 3 results) No results for input(s): "PROBNP" in the last 8760 hours. HbA1C: No results for input(s): "HGBA1C" in the last 72 hours. CBG: Recent Labs  Lab 02/15/23 1315 02/15/23 1647  GLUCAP 119* 122*   Lipid Profile: No results for input(s): "CHOL", "HDL", "LDLCALC", "TRIG", "CHOLHDL", "LDLDIRECT" in the last 72 hours. Thyroid Function Tests: No results for input(s): "TSH", "T4TOTAL", "FREET4", "T3FREE", "THYROIDAB" in the last 72 hours. Anemia Panel: Recent Labs    02/15/23 1206  VITAMINB12 1,212*  FOLATE 19.8  FERRITIN 2,166*  TIBC 216*  IRON 74  RETICCTPCT 4.9*   Sepsis Labs: Recent Labs  Lab 02/15/23 0728 02/15/23 0857 02/16/23 0755  LATICACIDVEN 8.0* 3.1* 1.8    Recent Results (from the past 240 hour(s))  Blood Culture (routine x 2)     Status: Abnormal (Preliminary result)   Collection Time: 02/15/23  7:02 AM   Specimen: BLOOD LEFT FOREARM  Result Value Ref Range Status   Specimen Description   Final    BLOOD LEFT FOREARM BOTTLES DRAWN AEROBIC AND ANAEROBIC Performed at [facility], [address], [place], [place] [address]    Special Requests   Final    Blood Culture results may not be optimal due to an inadequate volume of blood received in culture bottles Performed at [facility], [address] [place], [place] [address]    Culture  Setup Time   Final    GRAM POSITIVE COCCI ANAEROBIC BOTTLE ONLY Gram Stain Report Called to,Read Back By and Verified With: S RICH AT 0752 ON [id number] BY S DALTON CRITICAL VALUE NOTED.  VALUE IS CONSISTENT WITH PREVIOUSLY REPORTED AND CALLED VALUE. Performed at [facility], [address], [place], [place] [address]    Culture STAPHYLOCOCCUS AUREUS (A)  Final   Report Status PENDING  Incomplete  Resp panel by RT-PCR (RSV, Flu A&B,  Covid) Anterior Nasal Swab     Status: None   Collection Time: 02/15/23  7:17 AM   Specimen: Anterior Nasal Swab  Result Value Ref Range Status   SARS Coronavirus 2 by RT PCR NEGATIVE NEGATIVE Final    Comment: (NOTE) SARS-CoV-2 target nucleic acids are NOT DETECTED.  The SARS-CoV-2 RNA is generally detectable in upper respiratory specimens during the acute phase of infection. The lowest concentration of SARS-CoV-2 viral copies this assay can detect is 138 copies/mL. A negative result does not preclude SARS-Cov-2 infection and should not be used as the sole basis for treatment or other patient management decisions. A negative result may occur with  improper specimen collection/handling, submission of specimen other than nasopharyngeal swab, presence of viral mutation(s) within the areas targeted by this assay, and inadequate number of viral copies(<138 copies/mL). A negative result must be combined with clinical observations, patient history, and epidemiological information. The expected result is Negative.  Fact Sheet for Patients:  BloggerCourse.com  Fact Sheet for Healthcare Providers:  SeriousBroker.it  This test is no t yet approved or cleared by the [place] FDA and  has been authorized for detection and/or diagnosis of SARS-CoV-2 by FDA under an Emergency Use Authorization (EUA). This EUA will remain  in effect (meaning this test can be used) for the duration of the COVID-19 declaration under Section 564(b)(1) of the Act, 21 U.S.C.section 360bbb-3(b)(1), unless the authorization is terminated  or revoked sooner.       Influenza A by PCR NEGATIVE NEGATIVE Final   Influenza B by PCR NEGATIVE NEGATIVE Final    Comment: (NOTE) The Xpert Xpress SARS-CoV-2/FLU/RSV plus assay  is intended as an aid in the diagnosis of influenza from Nasopharyngeal swab specimens and should not be used as a sole basis for treatment. Nasal washings and aspirates are unacceptable for Xpert Xpress SARS-CoV-2/FLU/RSV testing.  Fact Sheet for Patients: BloggerCourse.com  Fact Sheet for Healthcare Providers: SeriousBroker.it  This test is not yet approved or cleared by the [place] FDA and has been authorized for detection and/or diagnosis of SARS-CoV-2 by FDA under an Emergency Use Authorization (EUA). This EUA will remain in effect (meaning this test can be used) for the duration of the COVID-19 declaration under Section 564(b)(1) of the Act, 21 U.S.C. section 360bbb-3(b)(1), unless the authorization is terminated or revoked.     Resp Syncytial Virus by PCR NEGATIVE NEGATIVE Final    Comment: (NOTE) Fact Sheet for Patients: BloggerCourse.com  Fact Sheet for Healthcare Providers: SeriousBroker.it  This test is not yet approved or cleared by the [place] FDA and has been authorized for detection and/or diagnosis of SARS-CoV-2 by FDA under an Emergency Use Authorization (EUA). This EUA will remain in effect (meaning this test can be used) for the duration of the COVID-19 declaration under Section 564(b)(1) of the Act, 21 U.S.C. section 360bbb-3(b)(1), unless the authorization is terminated or revoked.  Performed at [facility], [address] [place], [place] [address]   Blood Culture (routine x 2)     Status: Abnormal (Preliminary result)   Collection Time: 02/15/23  7:28 AM   Specimen: BLOOD RIGHT ARM  Result Value Ref Range Status   Specimen Description   Final    BLOOD RIGHT ARM BOTTLES DRAWN AEROBIC AND ANAEROBIC Performed at [facility], [address] [place], [place] [address]    Special Requests   Final    Blood Culture adequate volume Performed at  [facility], [address], [place], [place] [address]  Culture  Setup Time   Final    GRAM POSITIVE COCCI ANAEROBIC BOTTLE ONLY Gram Stain Report Called to,Read Back By and Verified With: S RICH AT 0752 ON [id number] BY S DALTON CRITICAL RESULT CALLED TO, READ BACK BY AND VERIFIED WITH: PHARMD JESSICA MILLEN [id number] AT 1414 BY EC    Culture (A)  Final    STAPHYLOCOCCUS AUREUS SUSCEPTIBILITIES TO FOLLOW Performed at [facility], [address], [place], [place] [address]    Report Status PENDING  Incomplete  Blood Culture ID Panel (Reflexed)     Status: Abnormal   Collection Time: 02/15/23  7:28 AM  Result Value Ref Range Status   Enterococcus faecalis NOT DETECTED NOT DETECTED Final   Enterococcus Faecium NOT DETECTED NOT DETECTED Final   Listeria monocytogenes NOT DETECTED NOT DETECTED Final   Staphylococcus species DETECTED (A) NOT DETECTED Final    Comment: CRITICAL RESULT CALLED TO, READ BACK BY AND VERIFIED WITH: PHARMD JESSICA MILLEN [id number] AT 1414 BY EC    Staphylococcus aureus (BCID) DETECTED (A) NOT DETECTED Final    Comment: CRITICAL RESULT CALLED TO, READ BACK BY AND VERIFIED WITH: PAHRAM JESSIC MILLEN [id number] AT 1414 BY EC    Staphylococcus epidermidis NOT DETECTED NOT DETECTED Final   Staphylococcus lugdunensis NOT DETECTED NOT DETECTED Final   Streptococcus species NOT DETECTED NOT DETECTED Final   Streptococcus agalactiae NOT DETECTED NOT DETECTED Final   Streptococcus pneumoniae NOT DETECTED NOT DETECTED Final   Streptococcus pyogenes NOT DETECTED NOT DETECTED Final   A.calcoaceticus-baumannii NOT DETECTED NOT DETECTED Final   Bacteroides fragilis NOT DETECTED NOT DETECTED Final   Enterobacterales NOT DETECTED NOT DETECTED Final   Enterobacter cloacae complex NOT DETECTED NOT DETECTED Final   Escherichia coli NOT DETECTED NOT DETECTED Final   Klebsiella aerogenes NOT DETECTED NOT DETECTED Final   Klebsiella oxytoca NOT DETECTED NOT DETECTED Final    Klebsiella pneumoniae NOT DETECTED NOT DETECTED Final   Proteus species NOT DETECTED NOT DETECTED Final   Salmonella species NOT DETECTED NOT DETECTED Final   Serratia marcescens NOT DETECTED NOT DETECTED Final   Haemophilus influenzae NOT DETECTED NOT DETECTED Final   Neisseria meningitidis NOT DETECTED NOT DETECTED Final   Pseudomonas aeruginosa NOT DETECTED NOT DETECTED Final   Stenotrophomonas maltophilia NOT DETECTED NOT DETECTED Final   Candida albicans NOT DETECTED NOT DETECTED Final   Candida auris NOT DETECTED NOT DETECTED Final   Candida glabrata NOT DETECTED NOT DETECTED Final   Candida krusei NOT DETECTED NOT DETECTED Final   Candida parapsilosis NOT DETECTED NOT DETECTED Final   Candida tropicalis NOT DETECTED NOT DETECTED Final   Cryptococcus neoformans/gattii NOT DETECTED NOT DETECTED Final   Meth resistant mecA/C and MREJ NOT DETECTED NOT DETECTED Final    Comment: Performed at [facility], [address], [place], [place] [address]  MRSA Next Gen by PCR, Nasal     Status: None   Collection Time: 02/15/23 11:03 AM   Specimen: Nasal Mucosa; Nasal Swab  Result Value Ref Range Status   MRSA by PCR Next Gen NOT DETECTED NOT DETECTED Final    Comment: (NOTE) The GeneXpert MRSA Assay (FDA approved for NASAL specimens only), is one component of a comprehensive MRSA colonization surveillance program. It is not intended to diagnose MRSA infection nor to guide or monitor treatment for MRSA infections. Test performance is not FDA approved in patients less than 36 years old. Performed at [facility], [address], [place], [place] [address]  Culture, blood (Routine X 2) w Reflex to ID Panel     Status: None (Preliminary result)   Collection Time: 02/17/23  5:42 AM   Specimen: BLOOD  Result Value Ref Range Status   Specimen Description BLOOD LEFT ANTECUBITAL  Final   Special Requests   Final    BOTTLES DRAWN AEROBIC AND ANAEROBIC Blood Culture results may not be  optimal due to an inadequate volume of blood received in culture bottles   Culture   Final    NO GROWTH 1 DAY Performed at [facility], [address] [place], [place] [address]    Report Status PENDING  Incomplete  Culture, blood (Routine X 2) w Reflex to ID Panel     Status: None (Preliminary result)   Collection Time: 02/17/23  5:43 AM   Specimen: BLOOD LEFT FOREARM  Result Value Ref Range Status   Specimen Description BLOOD LEFT FOREARM  Final   Special Requests   Final    BOTTLES DRAWN AEROBIC AND ANAEROBIC Blood Culture results may not be optimal due to an inadequate volume of blood received in culture bottles   Culture   Final    NO GROWTH 1 DAY Performed at [facility], [address], [place], [place] [address]    Report Status PENDING  Incomplete         Radiology Studies: ECHOCARDIOGRAM COMPLETE BUBBLE STUDY  Result Date: 02/17/2023    ECHOCARDIOGRAM REPORT   Patient Name:   David Joseph Date of Exam: 02/17/2023 Medical Rec #:  [id number]       Height:       68.0 in Accession #:    [id number]      Weight:       132.3 lb Date of Birth:  September 22, 1940       BSA:          1.714 m Patient Age:    82 years        BP:           107/57 mmHg Patient Gender: M               HR:           71 bpm. Exam Location:  Inpatient Procedure: 2D Echo, Cardiac Doppler and Color Doppler Indications:    Endocarditis  History:        Patient has no prior history of Echocardiogram examinations.                 CHF, Stroke and PAD, Arrythmias:Atrial Fibrillation; Risk                 Factors:Diabetes and Former Smoker. Myelodysplastic syndrome,                 ILD.  Sonographer:    Milda Smart Referring Phys: [facility] IMPRESSIONS  1. Left ventricular ejection fraction, by estimation, is 35 to 40%. Left ventricular ejection fraction by 2D MOD biplane is 38.9 %. The left ventricle has moderately decreased function. The left ventricle demonstrates global hypokinesis. Left ventricular  diastolic parameters are consistent with Grade III diastolic dysfunction (restrictive). Elevated left ventricular end-diastolic pressure.  2. Right ventricular systolic function is moderately reduced. The right ventricular size is normal. There is normal pulmonary artery systolic pressure. The estimated right ventricular systolic pressure is 21.5 mmHg.  3. Left atrial size was mildly dilated.  4. (Noted prior transseptal atrial puncture for Afib ablation at [facility] in 01/2022). Evidence  of atrial level shunting detected by color flow Doppler. 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  5. The mitral valve is abnormal. Mild to moderate mitral valve regurgitation.  6. The aortic valve is tricuspid. Aortic valve regurgitation is not visualized.  7. The aortic root is calcified. Aortic dilatation noted. There is borderline dilatation of the ascending aorta, measuring 38 mm.  8. The inferior vena cava is dilated in size with >50% respiratory variability, suggesting right atrial pressure of 8 mmHg. Comparison(s): Prior images unable to be directly viewed, comparison made by report only. Changes from prior study are noted. 12/03/2021 [facility]): LVEF 40-45%. Conclusion(s)/Recommendation(s): No evidence of valvular vegetations on this transthoracic echocardiogram. Consider a transesophageal echocardiogram to exclude infective endocarditis if clinically indicated. FINDINGS  Left Ventricle: Left ventricular ejection fraction, by estimation, is 35 to 40%. Left ventricular ejection fraction by 2D MOD biplane is 38.9 %. The left ventricle has moderately decreased function. The left ventricle demonstrates global hypokinesis. The left ventricular internal cavity size was normal in size. There is borderline concentric left ventricular hypertrophy. Left ventricular diastolic parameters are consistent with Grade III diastolic dysfunction (restrictive). Elevated  left ventricular end-diastolic pressure. Right Ventricle: The right ventricular size is normal. No increase in right ventricular wall thickness. Right ventricular systolic function is moderately reduced. There is normal pulmonary artery systolic pressure. The tricuspid regurgitant velocity is 1.84 m/s, and with an assumed right atrial pressure of 8 mmHg, the estimated right ventricular systolic pressure is 21.5 mmHg. Left Atrium: Left atrial size was mildly dilated. Right Atrium: Right atrial size was normal in size. Pericardium: There is no evidence of pericardial effusion. Mitral Valve: The mitral valve is abnormal. There is mild thickening of the mitral valve leaflet(s). Mild to moderate mitral annular calcification. Mild to moderate mitral valve regurgitation, with centrally-directed jet. Tricuspid Valve: The tricuspid valve is grossly normal. Tricuspid valve regurgitation is trivial. Aortic Valve: The aortic valve is tricuspid. Aortic valve regurgitation is not visualized. Pulmonic Valve: The pulmonic valve was grossly normal. Pulmonic valve regurgitation is trivial. Aorta: The aortic root is calcified. Aortic dilatation noted. There is borderline dilatation of the ascending aorta, measuring 38 mm. Venous: The inferior vena cava is dilated in size with greater than 50% respiratory variability, suggesting right atrial pressure of 8 mmHg. IAS/Shunts: Evidence of atrial level shunting detected by color flow Doppler. 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  LEFT VENTRICLE PLAX 2D                        Biplane EF (MOD) LVIDd:         4.70 cm         LV Biplane EF:   Left LVIDs:         3.90 cm                          ventricular LV PW:         1.50 cm                          ejection LV IVS:        1.30 cm  fraction by LVOT diam:     2.00 cm                          2D MOD LV SV:         42                                biplane is LV SV Index:   25                               38.9 %. LVOT Area:     3.14 cm                                Diastology                                LV e' medial:    4.12 cm/s LV Volumes (MOD)               LV E/e' medial:  25.7 LV vol d, MOD    76.1 ml       LV e' lateral:   5.17 cm/s A2C:                           LV E/e' lateral: 20.5 LV vol d, MOD    90.0 ml A4C: LV vol s, MOD    51.5 ml A2C: LV vol s, MOD    52.1 ml A4C: LV SV MOD A2C:   24.6 ml LV SV MOD A4C:   90.0 ml LV SV MOD BP:    33.7 ml RIGHT VENTRICLE            IVC RV S prime:     7.62 cm/s  IVC diam: 1.90 cm TAPSE (M-mode): 1.8 cm LEFT ATRIUM             Index        RIGHT ATRIUM           Index LA diam:        3.60 cm 2.10 cm/m   RA Area:     18.60 cm LA Vol (A2C):   76.0 ml 44.33 ml/m  RA Volume:   48.90 ml  28.52 ml/m LA Vol (A4C):   51.9 ml 30.27 ml/m LA Biplane Vol: 62.4 ml 36.40 ml/m  AORTIC VALVE LVOT Vmax:   71.50 cm/s LVOT Vmean:  51.000 cm/s LVOT VTI:    0.134 m  AORTA Ao Root diam: 3.30 cm Ao Asc diam:  3.80 cm MITRAL VALVE                TRICUSPID VALVE MV Area (PHT): 4.46 cm     TR Peak grad:   13.5 mmHg MV Decel Time: 170 msec     TR Vmax:        184.00 cm/s MR Peak grad: 84.6 mmHg MR Mean grad: 53.5 mmHg     SHUNTS MR Vmax:      460.00 cm/s   Systemic VTI:  0.13 m MR Vmean:     345.0 cm/s    Systemic Diam: 2.00 cm MV E velocity: 106.00 cm/s MV A velocity: 35.60 cm/s MV E/A  ratio:  2.98 Zoila Shutter MD Electronically signed by Zoila Shutter MD Signature Date/Time: 02/17/2023/2:34:49 PM    Final         Scheduled Meds:  sodium chloride   Intravenous Once   ascorbic acid  500 mg Oral Daily   Chlorhexidine Gluconate Cloth  6 each Topical Daily   cholecalciferol  1,000 Units Oral Daily   hydroxychloroquine  200 mg Oral Daily   insulin aspart  0-15 Units Subcutaneous TID WC   insulin aspart  0-5 Units Subcutaneous QHS   metoprolol succinate  12.5 mg Oral BID   pantoprazole  40 mg Oral Daily    potassium chloride SA  20 mEq Oral Daily   predniSONE  15 mg Oral Daily   pregabalin  100 mg Oral TID   valACYclovir  500 mg Oral Daily   Continuous Infusions:   ceFAZolin (ANCEF) IV 2 g (02/18/23 0519)   metronidazole 500 mg (02/17/23 2329)          Glade Lloyd, MD [facility] 02/18/2023, 8:03 AM

## 2023-02-18 NOTE — Progress Notes (Signed)
Plan for port removal today.  We discussed the risks, benefits and alternatives again in preop and he consented to proceed.  Will proceed as scheduled.  Quentin Ore, MD General, Bariatric and Minimally Invasive Surgery [facility]

## 2023-02-18 NOTE — TOC Progression Note (Signed)
Transition of Care [facility]) - Progression Note    Patient Details  Name: David Joseph MRN: [id number] Date of Birth: 11/27/1940  [facility]) CM/SW Contact  Carley Hammed, LCSW Phone Number: 02/18/2023, 4:11 PM  Clinical Narrative:    CSW was advised that pt's spouse is requesting a call to discuss disposition. CSW spoke with Okey Regal who noted pt was in [facility] in January and they had a good experience there. CSW advised that previously PT has recommended SNF, not CIR but PT will need to see pt again after procedure. Spouse agreeable to skilled rehab at either venue. CSW advised that at this time pt is still undergoing a medical workup and TOC will continue to follow to assist with disposition. TOC will continue to assist with pt advancement.    Expected Discharge Plan: Home w Home Health Services (await PT eval)    Expected Discharge Plan and Services   Discharge Planning Services: CM Consult Post Acute Care Choice:  (await PT eval) Living arrangements for the past 2 months: Single Family Home                 DME Arranged:  (await PT eval)         HH Arranged:  (see note)           Social Determinants of Health (SDOH) Interventions SDOH Screenings   Food Insecurity: No Food Insecurity (02/15/2023)  Housing: Low Risk  (02/15/2023)  Transportation Needs: No Transportation Needs (02/15/2023)  Utilities: Not At Risk (02/15/2023)  Alcohol Screen: Low Risk  (10/15/2021)  Depression (PHQ2-9): Low Risk  (10/15/2021)  Financial Resource Strain: Low Risk  (05/20/2022)   Received from [facility], [facility]  Physical Activity: Inactive (10/24/2022)   Received from [facility]  Social Connections: Socially Integrated (10/15/2021)  Stress: No Stress Concern Present (10/24/2022)   Received from [facility]  Tobacco Use: Medium Risk (02/18/2023)  Health Literacy: Medium Risk (07/29/2022)   Received from [facility]    Readmission Risk Interventions     No  data to display

## 2023-02-18 NOTE — Progress Notes (Signed)
PT Cancellation Note  Patient Details Name: David Joseph MRN: [id number] DOB: [birth year]   Cancelled Treatment:    Reason Eval/Treat Not Completed: Other (comment) (fatigue from surgery today) Pt at procedure earlier today and upon returning to unit, feeling fatigued and requesting time to rest. Will follow up as schedule allows.    Marlana Salvage Zaunegger Blima Rich PT, DPT 02/18/2023, 1:57 PM

## 2023-02-18 NOTE — Progress Notes (Signed)
Report given to Marissa, RN in short stay. Notified about 1 unit RBC ordered. Also instructed by RN to give metoprolol per appropriate vs. Metoprolol give BP 120/59 Pulse 72

## 2023-02-18 NOTE — Plan of Care (Signed)
  Problem: Pain Management: Goal: General experience of comfort will improve Outcome: Progressing   Problem: Safety: Goal: Ability to remain free from injury will improve Outcome: Progressing

## 2023-02-18 NOTE — Transfer of Care (Signed)
Immediate Anesthesia Transfer of Care Note  Patient: David Joseph  Procedure(s) Performed: REMOVAL PORT-A-CATH (Chest)  Patient Location: PACU  Anesthesia Type:MAC  Level of Consciousness: sedated  Airway & Oxygen Therapy: Patient Spontanous Breathing  Post-op Assessment: Report given to RN  Post vital signs: Reviewed and stable  Last Vitals:  Vitals Value Taken Time  BP 104/57 02/18/23 1036  Temp    Pulse 69 02/18/23 1038  Resp 18 02/18/23 1038  SpO2 98 % 02/18/23 1038  Vitals shown include unfiled device data.  Last Pain:  Vitals:   02/18/23 0902  TempSrc: Oral  PainSc:       Patients Stated Pain Goal: 2 (02/18/23 0834)  Complications: No notable events documented.

## 2023-02-19 ENCOUNTER — Encounter (HOSPITAL_COMMUNITY): Payer: Self-pay | Admitting: Surgery

## 2023-02-19 ENCOUNTER — Encounter (HOSPITAL_COMMUNITY)
Admission: EM | Disposition: A | Discharge status: Skilled Nursing Facility | Payer: Self-pay | Source: Home / Self Care | Attending: Family Medicine

## 2023-02-19 DIAGNOSIS — K819 Cholecystitis, unspecified: Secondary | ICD-10-CM | POA: Diagnosis not present

## 2023-02-19 LAB — CBC
HCT: 28 % — ABNORMAL LOW (ref 39.0–52.0)
Hemoglobin: 9.1 g/dL — ABNORMAL LOW (ref 13.0–17.0)
MCH: 35.7 pg — ABNORMAL HIGH (ref 26.0–34.0)
MCHC: 32.5 g/dL (ref 30.0–36.0)
MCV: 109.8 fL — ABNORMAL HIGH (ref 80.0–100.0)
Platelets: 34 10*3/uL — ABNORMAL LOW (ref 150–400)
RBC: 2.55 MIL/uL — ABNORMAL LOW (ref 4.22–5.81)
WBC: 11.8 10*3/uL — ABNORMAL HIGH (ref 4.0–10.5)
nRBC: 0.8 % — ABNORMAL HIGH (ref 0.0–0.2)

## 2023-02-19 LAB — BPAM RBC
Blood Product Expiration Date: 202412212359
Blood Product Expiration Date: 202412192359
ISSUE DATE / TIME: 202412030830
ISSUE DATE / TIME: 202412011055
Unit Type and Rh: 6200
Unit Type and Rh: 6200

## 2023-02-19 LAB — COMPREHENSIVE METABOLIC PANEL
ALT: 50 U/L — ABNORMAL HIGH (ref 0–44)
AST: 64 U/L — ABNORMAL HIGH (ref 15–41)
Albumin: 2.2 g/dL — ABNORMAL LOW (ref 3.5–5.0)
Alkaline Phosphatase: 50 U/L (ref 38–126)
Anion gap: 5 (ref 5–15)
BUN: 24 mg/dL — ABNORMAL HIGH (ref 8–23)
CO2: 21 mmol/L — ABNORMAL LOW (ref 22–32)
Calcium: 7.6 mg/dL — ABNORMAL LOW (ref 8.9–10.3)
Chloride: 107 mmol/L (ref 98–111)
Creatinine, Ser: 1.07 mg/dL (ref 0.61–1.24)
GFR, Estimated: >60 mL/min (ref >60)
Glucose, Bld: 103 mg/dL — ABNORMAL HIGH (ref 70–99)
Potassium: 4 mmol/L (ref 3.5–5.1)
Sodium: 133 mmol/L — ABNORMAL LOW (ref 135–145)
Total Bilirubin: 6.2 mg/dL — ABNORMAL HIGH (ref <1.2)
Total Protein: 4.8 g/dL — ABNORMAL LOW (ref 6.5–8.1)

## 2023-02-19 LAB — CULTURE, BLOOD (ROUTINE X 2): Special Requests: ADEQUATE

## 2023-02-19 LAB — TYPE AND SCREEN
ABO/RH(D): A POS
Antibody Screen: NEGATIVE
Unit division: 0
Unit division: 0

## 2023-02-19 LAB — GLUCOSE, CAPILLARY
Glucose-Capillary: 90 mg/dL (ref 70–99)
Glucose-Capillary: 101 mg/dL — ABNORMAL HIGH (ref 70–99)
Glucose-Capillary: 161 mg/dL — ABNORMAL HIGH (ref 70–99)
Glucose-Capillary: 159 mg/dL — ABNORMAL HIGH (ref 70–99)

## 2023-02-19 LAB — C-REACTIVE PROTEIN: CRP: 9.7 mg/dL — ABNORMAL HIGH (ref <1.0)

## 2023-02-19 LAB — PROTIME-INR
INR: 1.9 — ABNORMAL HIGH (ref 0.8–1.2)
Prothrombin Time: 22 s — ABNORMAL HIGH (ref 11.4–15.2)

## 2023-02-19 LAB — HEMOGLOBIN A1C
Hgb A1c MFr Bld: 5.6 % (ref 4.8–5.6)
Mean Plasma Glucose: 114.02 mg/dL

## 2023-02-19 LAB — MAGNESIUM: Magnesium: 1.9 mg/dL (ref 1.7–2.4)

## 2023-02-19 SURGERY — TRANSESOPHAGEAL ECHOCARDIOGRAM (TEE) (CATHLAB)
Anesthesia: Monitor Anesthesia Care

## 2023-02-19 MED ORDER — METOPROLOL SUCCINATE ER 25 MG PO TB24
12.5000 mg | ORAL_TABLET | Freq: Two times a day (BID) | ORAL | Status: DC
  Administered 2023-02-19 – 2023-02-24 (×5): 12.5 mg via ORAL
  Filled 2023-02-19 (×9): qty 1

## 2023-02-19 NOTE — TOC Progression Note (Signed)
Transition of Care [facility]) - Progression Note    Patient Details  Name: David Joseph MRN: [id number] Date of Birth: 18-Apr-1940  Transition of Care [facility]) CM/SW Contact  Carley Hammed, LCSW Phone Number: 02/19/2023, 3:09 PM  Clinical Narrative:     CSW received a call from pt's spouse noting pt's procedure had been canceled for today due to ongoing medical concerns. She notes a continued interest in SNF at [facility]. CSW continues to follow and will pursue rehab when more appropriate.  Expected Discharge Plan: Home w Home Health Services (await PT eval)    Expected Discharge Plan and Services   Discharge Planning Services: CM Consult Post Acute Care Choice:  (await PT eval) Living arrangements for the past 2 months: Single Family Home                 DME Arranged:  (await PT eval)         HH Arranged:  (see note)           Social Determinants of Health (SDOH) Interventions SDOH Screenings   Food Insecurity: No Food Insecurity (02/15/2023)  Housing: Low Risk  (02/15/2023)  Transportation Needs: No Transportation Needs (02/15/2023)  Utilities: Not At Risk (02/15/2023)  Alcohol Screen: Low Risk  (10/15/2021)  Depression (PHQ2-9): Low Risk  (10/15/2021)  Financial Resource Strain: Low Risk  (05/20/2022)   Received from [facility]  Physical Activity: Inactive (10/24/2022)   Received from [facility]  Social Connections: Socially Integrated (10/15/2021)  Stress: No Stress Concern Present (10/24/2022)   Received from [facility]  Tobacco Use: Medium Risk (02/18/2023)  Health Literacy: Medium Risk (07/29/2022)   Received from [facility]    Readmission Risk Interventions     No data to display

## 2023-02-19 NOTE — Progress Notes (Signed)
[facility] Progress Note  1 Day Post-Op  Subjective: CC:  NAEO. Resting comfortably.  Objective: Vital signs in last 24 hours: Temp:  [97.6 F (36.4 C)-99.7 F (37.6 C)] 98.9 F (37.2 C) (12/04 0809) Pulse Rate:  [66-80] 72 (12/04 0809) Resp:  [16-20] 16 (12/04 0809) BP: (96-122)/(51-89) 96/51 (12/04 0809) SpO2:  [92 %-100 %] 96 % (12/04 0809) Weight:  [75.8 kg] 75.8 kg (12/04 0359) Last BM Date : 02/14/23  Intake/Output from previous day: 12/03 0701 - 12/04 0700 In: 354 [Blood:254; IV Piggyback:100] Out: 900 [Urine:900] Intake/Output this shift: No intake/output data recorded.  PE: Gen:  Alert, NAD, pleasant Card:  Regular rate and rhythm Pulm:  Normal effort ORA Surgical incision over R chest wall c/d/I, mild ecchymosis, no large hematoma.  Abd: Soft, non-tender, non-distended Skin: warm and dry, no rashes  Psych: A&Ox3   Lab Results:  Recent Labs    02/18/23 0304 02/19/23 0625  WBC 9.8 11.8*  HGB 7.0* 9.1*  HCT 22.5* 28.0*  PLT 36*  35* 34*   BMET Recent Labs    02/18/23 0304 02/19/23 0625  NA 132* 133*  K 4.3 4.0  CL 104 107  CO2 21* 21*  GLUCOSE 186* 103*  BUN 26* 24*  CREATININE 1.15 1.07  CALCIUM 7.7* 7.6*   PT/INR Recent Labs    02/18/23 0304 02/19/23 0625  LABPROT 21.7* 22.0*  INR 1.9* 1.9*   CMP     Component Value Date/Time   NA 133 (L) 02/19/2023 0625   K 4.0 02/19/2023 0625   CL 107 02/19/2023 0625   CO2 21 (L) 02/19/2023 0625   GLUCOSE 103 (H) 02/19/2023 0625   BUN 24 (H) 02/19/2023 0625   CREATININE 1.07 02/19/2023 0625   CALCIUM 7.6 (L) 02/19/2023 0625   PROT 4.8 (L) 02/19/2023 0625   ALBUMIN 2.2 (L) 02/19/2023 0625   AST 64 (H) 02/19/2023 0625   ALT 50 (H) 02/19/2023 0625   ALKPHOS 50 02/19/2023 0625   BILITOT 6.2 (H) 02/19/2023 0625   GFRNONAA >60 02/19/2023 0625   GFRAA >60 10/11/2015 0416   Lipase  No results found for: "LIPASE"     Studies/Results: MR ABDOMEN MRCP W WO CONTAST  Result  Date: 02/18/2023 CLINICAL DATA:  Jaundice, cholelithiasis EXAM: MRI ABDOMEN WITHOUT AND WITH CONTRAST (INCLUDING MRCP) TECHNIQUE: Multiplanar multisequence MR imaging of the abdomen was performed both before and after the administration of intravenous contrast. Heavily T2-weighted images of the biliary and pancreatic ducts were obtained, and three-dimensional MRCP images were rendered by post processing. CONTRAST:  6mL GADAVIST GADOBUTROL 1 MMOL/ML IV SOLN COMPARISON:  CT chest abdomen pelvis, 02/15/2023, right upper quadrant ultrasound, 02/15/2023 FINDINGS: Lower chest: Cardiomegaly.  Small bilateral pleural effusions. Hepatobiliary: No solid liver abnormality is seen. Hepatic signal inversion on in and opposed phase imaging as well as intrinsic T2 hypointensity of the liver. The gallbladder is filled with small gallstones (series 5, image 20). No gallbladder wall thickening or pericholecystic fluid. No biliary ductal dilatation. Pancreas: Unremarkable. No pancreatic ductal dilatation or surrounding inflammatory changes. Spleen: Normal in size. Splenic signal inversion on in and opposed phase imaging as well as intrinsic T2 hypointensity of the spleen. Adrenals/Urinary Tract: Adrenal glands are unremarkable. Simple, benign bilateral renal cortical cysts, for which no further follow-up or characterization is required. Kidneys are otherwise normal, without obvious renal calculi, solid lesion, or hydronephrosis. Stomach/Bowel: Stomach is within normal limits. No evidence of bowel wall thickening, distention, or inflammatory changes. Vascular/Lymphatic: Aortic atherosclerosis. No  enlarged abdominal lymph nodes. Other: No abdominal wall hernia.  Anasarca.  No ascites. Musculoskeletal: No acute or significant osseous findings. IMPRESSION: 1. Cholelithiasis without evidence of acute cholecystitis. 2. No biliary ductal dilatation or choledocholithiasis. 3. Hepatic and splenic signal characteristics consistent with iron  deposition. 4. Cardiomegaly. 5. Small bilateral pleural effusions. 6. Anasarca. Aortic Atherosclerosis (ICD10-I70.0). Electronically Signed   By: Jearld Lesch M.D.   On: 02/18/2023 08:28   MR 3D Recon At Scanner  Result Date: 02/18/2023 CLINICAL DATA:  Jaundice, cholelithiasis EXAM: MRI ABDOMEN WITHOUT AND WITH CONTRAST (INCLUDING MRCP) TECHNIQUE: Multiplanar multisequence MR imaging of the abdomen was performed both before and after the administration of intravenous contrast. Heavily T2-weighted images of the biliary and pancreatic ducts were obtained, and three-dimensional MRCP images were rendered by post processing. CONTRAST:  6mL GADAVIST GADOBUTROL 1 MMOL/ML IV SOLN COMPARISON:  CT chest abdomen pelvis, 02/15/2023, right upper quadrant ultrasound, 02/15/2023 FINDINGS: Lower chest: Cardiomegaly.  Small bilateral pleural effusions. Hepatobiliary: No solid liver abnormality is seen. Hepatic signal inversion on in and opposed phase imaging as well as intrinsic T2 hypointensity of the liver. The gallbladder is filled with small gallstones (series 5, image 20). No gallbladder wall thickening or pericholecystic fluid. No biliary ductal dilatation. Pancreas: Unremarkable. No pancreatic ductal dilatation or surrounding inflammatory changes. Spleen: Normal in size. Splenic signal inversion on in and opposed phase imaging as well as intrinsic T2 hypointensity of the spleen. Adrenals/Urinary Tract: Adrenal glands are unremarkable. Simple, benign bilateral renal cortical cysts, for which no further follow-up or characterization is required. Kidneys are otherwise normal, without obvious renal calculi, solid lesion, or hydronephrosis. Stomach/Bowel: Stomach is within normal limits. No evidence of bowel wall thickening, distention, or inflammatory changes. Vascular/Lymphatic: Aortic atherosclerosis. No enlarged abdominal lymph nodes. Other: No abdominal wall hernia.  Anasarca.  No ascites. Musculoskeletal: No acute or  significant osseous findings. IMPRESSION: 1. Cholelithiasis without evidence of acute cholecystitis. 2. No biliary ductal dilatation or choledocholithiasis. 3. Hepatic and splenic signal characteristics consistent with iron deposition. 4. Cardiomegaly. 5. Small bilateral pleural effusions. 6. Anasarca. Aortic Atherosclerosis (ICD10-I70.0). Electronically Signed   By: Jearld Lesch M.D.   On: 02/18/2023 08:28   ECHOCARDIOGRAM COMPLETE BUBBLE STUDY  Result Date: 02/17/2023    ECHOCARDIOGRAM REPORT   Patient Name:   David Joseph Date of Exam: 02/17/2023 Medical Rec #:  [id number]       Height:       68.0 in Accession #:    [id number]      Weight:       132.3 lb Date of Birth:  03-27-1940       BSA:          1.714 m Patient Age:    82 years        BP:           107/57 mmHg Patient Gender: M               HR:           71 bpm. Exam Location:  Inpatient Procedure: 2D Echo, Cardiac Doppler and Color Doppler Indications:    Endocarditis  History:        Patient has no prior history of Echocardiogram examinations.                 CHF, Stroke and PAD, Arrythmias:Atrial Fibrillation; Risk                 Factors:Diabetes and Former Smoker. Myelodysplastic syndrome,  ILD.  Sonographer:    Milda Smart Referring Phys: Gwyneth Sprout [facility] [place] IMPRESSIONS  1. Left ventricular ejection fraction, by estimation, is 35 to 40%. Left ventricular ejection fraction by 2D MOD biplane is 38.9 %. The left ventricle has moderately decreased function. The left ventricle demonstrates global hypokinesis. Left ventricular diastolic parameters are consistent with Grade III diastolic dysfunction (restrictive). Elevated left ventricular end-diastolic pressure.  2. Right ventricular systolic function is moderately reduced. The right ventricular size is normal. There is normal pulmonary artery systolic pressure. The estimated right ventricular systolic pressure is 21.5 mmHg.  3. Left atrial size was mildly dilated.  4. (Noted prior  transseptal atrial puncture for Afib ablation at [facility] in 01/2022). Evidence of atrial level shunting detected by color flow Doppler. 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  5. The mitral valve is abnormal. Mild to moderate mitral valve regurgitation.  6. The aortic valve is tricuspid. Aortic valve regurgitation is not visualized.  7. The aortic root is calcified. Aortic dilatation noted. There is borderline dilatation of the ascending aorta, measuring 38 mm.  8. The inferior vena cava is dilated in size with >50% respiratory variability, suggesting right atrial pressure of 8 mmHg. Comparison(s): Prior images unable to be directly viewed, comparison made by report only. Changes from prior study are noted. 12/03/2021 [facility]): LVEF 40-45%. Conclusion(s)/Recommendation(s): No evidence of valvular vegetations on this transthoracic echocardiogram. Consider a transesophageal echocardiogram to exclude infective endocarditis if clinically indicated. FINDINGS  Left Ventricle: Left ventricular ejection fraction, by estimation, is 35 to 40%. Left ventricular ejection fraction by 2D MOD biplane is 38.9 %. The left ventricle has moderately decreased function. The left ventricle demonstrates global hypokinesis. The left ventricular internal cavity size was normal in size. There is borderline concentric left ventricular hypertrophy. Left ventricular diastolic parameters are consistent with Grade III diastolic dysfunction (restrictive). Elevated left ventricular end-diastolic pressure. Right Ventricle: The right ventricular size is normal. No increase in right ventricular wall thickness. Right ventricular systolic function is moderately reduced. There is normal pulmonary artery systolic pressure. The tricuspid regurgitant velocity is 1.84 m/s, and with an assumed right atrial pressure of 8 mmHg, the estimated right ventricular systolic pressure is  21.5 mmHg. Left Atrium: Left atrial size was mildly dilated. Right Atrium: Right atrial size was normal in size. Pericardium: There is no evidence of pericardial effusion. Mitral Valve: The mitral valve is abnormal. There is mild thickening of the mitral valve leaflet(s). Mild to moderate mitral annular calcification. Mild to moderate mitral valve regurgitation, with centrally-directed jet. Tricuspid Valve: The tricuspid valve is grossly normal. Tricuspid valve regurgitation is trivial. Aortic Valve: The aortic valve is tricuspid. Aortic valve regurgitation is not visualized. Pulmonic Valve: The pulmonic valve was grossly normal. Pulmonic valve regurgitation is trivial. Aorta: The aortic root is calcified. Aortic dilatation noted. There is borderline dilatation of the ascending aorta, measuring 38 mm. Venous: The inferior vena cava is dilated in size with greater than 50% respiratory variability, suggesting right atrial pressure of 8 mmHg. IAS/Shunts: Evidence of atrial level shunting detected by color flow Doppler. 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  LEFT VENTRICLE PLAX 2D                        Biplane EF (MOD) LVIDd:         4.70 cm  LV Biplane EF:   Left LVIDs:         3.90 cm                          ventricular LV PW:         1.50 cm                          ejection LV IVS:        1.30 cm                          fraction by LVOT diam:     2.00 cm                          2D MOD LV SV:         42                               biplane is LV SV Index:   25                               38.9 %. LVOT Area:     3.14 cm                                Diastology                                LV e' medial:    4.12 cm/s LV Volumes (MOD)               LV E/e' medial:  25.7 LV vol d, MOD    76.1 ml       LV e' lateral:   5.17 cm/s A2C:                           LV E/e' lateral: 20.5 LV vol d, MOD    90.0 ml A4C: LV vol s,  MOD    51.5 ml A2C: LV vol s, MOD    52.1 ml A4C: LV SV MOD A2C:   24.6 ml LV SV MOD A4C:   90.0 ml LV SV MOD BP:    33.7 ml RIGHT VENTRICLE            IVC RV S prime:     7.62 cm/s  IVC diam: 1.90 cm TAPSE (M-mode): 1.8 cm LEFT ATRIUM             Index        RIGHT ATRIUM           Index LA diam:        3.60 cm 2.10 cm/m   RA Area:     18.60 cm LA Vol (A2C):   76.0 ml 44.33 ml/m  RA Volume:   48.90 ml  28.52 ml/m LA Vol (A4C):   51.9 ml 30.27 ml/m LA Biplane Vol: 62.4 ml 36.40 ml/m  AORTIC VALVE LVOT Vmax:   71.50 cm/s LVOT Vmean:  51.000 cm/s LVOT VTI:    0.134 m  AORTA Ao Root diam: 3.30 cm Ao Asc  diam:  3.80 cm MITRAL VALVE                TRICUSPID VALVE MV Area (PHT): 4.46 cm     TR Peak grad:   13.5 mmHg MV Decel Time: 170 msec     TR Vmax:        184.00 cm/s MR Peak grad: 84.6 mmHg MR Mean grad: 53.5 mmHg     SHUNTS MR Vmax:      460.00 cm/s   Systemic VTI:  0.13 m MR Vmean:     345.0 cm/s    Systemic Diam: 2.00 cm MV E velocity: 106.00 cm/s MV A velocity: 35.60 cm/s MV E/A ratio:  2.98 Zoila Shutter MD Electronically signed by Zoila Shutter MD Signature Date/Time: 02/17/2023/2:34:49 PM    Final     Anti-infectives: Anti-infectives (From admission, onward)    Start     Dose/Rate Route Frequency Ordered Stop   02/17/23 0200  ceFAZolin (ANCEF) IVPB 1 g/50 mL premix  Status:  Discontinued        1 g 100 mL/hr over 30 Minutes Intravenous Every 8 hours 02/17/23 0059 02/17/23 0059   02/17/23 0200  ceFAZolin (ANCEF) IVPB 2g/100 mL premix        2 g 200 mL/hr over 30 Minutes Intravenous Every 8 hours 02/17/23 0059     02/16/23 0800  vancomycin (VANCOCIN) IVPB 1000 mg/200 mL premix  Status:  Discontinued        1,000 mg 200 mL/hr over 60 Minutes Intravenous Every 24 hours 02/15/23 0855 02/17/23 0059   02/15/23 2000  ceFEPIme (MAXIPIME) 2 g in sodium chloride 0.9 % 100 mL IVPB  Status:  Discontinued        2 g 200 mL/hr over 30 Minutes Intravenous Every 12 hours 02/15/23 0855 02/17/23 0059    02/15/23 2000  metroNIDAZOLE (FLAGYL) IVPB 500 mg        500 mg 100 mL/hr over 60 Minutes Intravenous Every 12 hours 02/15/23 1204 02/22/23 1959   02/15/23 1215  hydroxychloroquine (PLAQUENIL) tablet 200 mg        200 mg Oral Daily 02/15/23 1204     02/15/23 1215  valACYclovir (VALTREX) tablet 500 mg        500 mg Oral Daily 02/15/23 1204     02/15/23 0700  ceFEPIme (MAXIPIME) 2 g in sodium chloride 0.9 % 100 mL IVPB        2 g 200 mL/hr over 30 Minutes Intravenous  Once 02/15/23 0658 02/15/23 0856   02/15/23 0700  metroNIDAZOLE (FLAGYL) IVPB 500 mg        500 mg 100 mL/hr over 60 Minutes Intravenous  Once 02/15/23 0658 02/15/23 0856   02/15/23 0700  vancomycin (VANCOCIN) IVPB 1000 mg/200 mL premix        1,000 mg 200 mL/hr over 60 Minutes Intravenous  Once 02/15/23 1610 02/15/23 0856        Assessment/Plan  MSSA bacteremia  S/p port removal 12/3 Dr. Dossie Der  Surgical site looks good this morning - no signs of bleeding or cellulitis.  No symptoms consistent with cholecystitis or diverticulitis.  Mgmt of bacteremia per TRH and ID. I saw GI's note regarding hyperbilirubinemia.   Surgery will sign off, call as needed. No role for outpatient follow up. Can call as needed if concerns about port removal site.    LOS: 4 days   I reviewed nursing notes, Consultant GI, ID notes, hospitalist notes, last 24 h vitals and pain scores,  last 48 h intake and output, last 24 h labs and trends, and last 24 h imaging results.    Hosie Spangle, PA-C [facility] Please see Amion for pager number during day hours 7:00am-4:30pm

## 2023-02-19 NOTE — Progress Notes (Signed)
Physical Therapy Treatment Patient Details Name: David Joseph MRN: [id number] DOB: 1941-03-16 Today's Date: 02/19/2023   History of Present Illness 82 yo male presents to [facility] on 11/20 with generalized weakness and lower right sided abdominal pain x6 months. CT of abdomen showed possible acute diverticulitis and gallbladder with sludge and some gallbladder wall thickening. Pt also with L residual limb infection, + MSSA Bacteremia. Staph aureus, possibly related to stump wound or to [facility] -A-Cath which was removed 12/3. Had planned for TEE 12/4 but cancelled due to platelet count <50,000. 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    PT Comments  Pt received in supine, agreeable to therapy session with encouragement, with emphasis on supine exercise instruction for ROM/strengthening (avoiding resistive movements due to pt thrombocytopenia) and seated balance activity. 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 Pt needing up to +2 maxA for transfer training, not yet safe to work on slide board transfers this session due to pt fatigue and low platelets. Plan to work on OOB transfers next session if labs more stable and pt able to tolerate more intensive activity. Pt will continue to benefit from skilled rehab in a post acute setting < 3 hours/day to maximize functional gains before returning home.     If plan is discharge home, recommend the following: A lot of help with bathing/dressing/bathroom;Two people to help with walking and/or transfers;Assistance with cooking/housework;Help with stairs or ramp for entrance;Assist for transportation   Can travel by private vehicle     No  Equipment Recommendations  None recommended by PT    Recommendations for Other Services       Precautions / Restrictions Precautions Precautions:  Fall;Other (comment) Precaution Comments: Thrombocytopenia (platelets <50k 12/4) no resistive exercise, L elbow bruising/discomfort, mild scrotal edema and skin breakdown around residual limbs Restrictions Weight Bearing Restrictions: No     Mobility  Bed Mobility Overal bed mobility: Needs Assistance Bed Mobility: Rolling, Sidelying to Sit, Sit to Sidelying Rolling: +2 for safety/equipment, +2 for physical assistance, Mod assist, Used rails Sidelying to sit: Max assist, +2 for physical assistance, HOB elevated, Used rails     Sit to sidelying: Mod assist, +2 for physical assistance General bed mobility comments: Cues for self-assist, pt moving BLE well toward EOB but needs heavy lift to elevate trunk fully upright, pt has difficulty using bed rail effectively. Log roll for pt comfort returning to supine toward his R side.  Rolling to L/R for removal of sweaty blanket from under him and placement of new bed pad.    Transfers Overall transfer level: Needs assistance   Transfers: Bed to chair/wheelchair/BSC            Lateral/Scoot Transfers: Max assist, +2 physical assistance General transfer comment: lateral scooting toward HOB, pt has difficulty lifting hips upright with chair push-up body mechanics and weakness/fatigue. +2 maxA via bed pad toward R side (HOB), small propulsion scoots x4 attempts    Ambulation/Gait             Pre-gait activities: non-amb at baseline     Stairs             Wheelchair Mobility     Tilt Bed    Modified Rankin (Stroke Patients Only)       Balance Overall balance assessment: Needs assistance, History of Falls  Cognition Arousal: Alert Behavior During Therapy: Anxious, WFL for tasks assessed/performed Overall Cognitive Status: History of cognitive impairments - at baseline                                 General Comments: Pt more alert in PM  session compared with OT session in AM, and reports he has been uncomfortable all day but this afternoon feels a little better than in AM.        Exercises Amputee Exercises Quad Sets: AROM, Both, 10 reps, Supine Towel Squeeze: AROM, Both, 5 reps, Supine Hip ABduction/ADduction: AROM, Both, 5 reps, Supine Hip Flexion/Marching: AROM, Both, 5 reps, Supine Chair Push Up: 5 reps, Seated (cues for technique while pt attempting lateral scoot, to deweight his hips)    General Comments General comments (skin integrity, edema, etc.): skin breakdown around both limbs and posterior RLE appears reddened as well as pt's L elbow redness/edema. yellowish tinge to his skin, pt c/o discomfort/itching severe around lower back so area washed and dried while pt sitting upright and barrier ointment applied. Pt encouraged to roll Q2H and written on board for RN. PTA discussed technique for scrotal edema mgmt/positioning      Pertinent Vitals/Pain Pain Assessment Pain Assessment: Faces Faces Pain Scale: Hurts even more Pain Location: L elbow, lateral RLE, back, scrotal region Pain Descriptors / Indicators: Guarding, Grimacing, Sore, Tender Pain Intervention(s): Limited activity within patient's tolerance, Monitored during session, Repositioned (pillows under limbs and LUE for pressure relief)    Home Living                          Prior Function            PT Goals (current goals can now be found in the care plan section) Acute Rehab PT Goals Patient Stated Goal: To feel better and move around on my own PT Goal Formulation: With patient Time For Goal Achievement: 03/03/23 Progress towards PT goals: Progressing toward goals    Frequency    Min 1X/week      PT Plan      Co-evaluation              AM-PAC PT "6 Clicks" Mobility   Outcome Measure  Help needed turning from your back to your side while in a flat bed without using bedrails?: A Lot Help needed moving from lying  on your back to sitting on the side of a flat bed without using bedrails?: A Lot Help needed moving to and from a bed to a chair (including a wheelchair)?: Total Help needed standing up from a chair using your arms (e.g., wheelchair or bedside chair)?: Total Help needed to walk in hospital room?: Total Help needed climbing 3-5 steps with a railing? : Total 6 Click Score: 8    End of Session Equipment Utilized During Treatment: Other (comment) (bed pads) Activity Tolerance: Patient tolerated treatment well;Patient limited by fatigue Patient left: in bed;with call bell/phone within reach;with bed alarm set;Other (comment) (bed in chair posture, pt water refilled for him, pillows to offload distal limbs and reduce pressure) Nurse Communication: Mobility status;Other (comment) (roll q2h) PT Visit Diagnosis: Other abnormalities of gait and mobility (R26.89)     Time: 5638-7564 PT Time Calculation (min) (ACUTE ONLY): 32 min  Charges:    $Therapeutic Exercise: 8-22 mins $Therapeutic Activity: 8-22 mins PT General Charges $$ ACUTE PT VISIT: 1  Visit                     Florina Ou., PTA Acute Rehabilitation Services Secure Chat Preferred 9a-5:30pm Office: [phone]    Dorathy Kinsman [facility] 02/19/2023, 5:59 PM

## 2023-02-19 NOTE — Progress Notes (Signed)
David Joseph   DOB:October 21, 1940   UX#:[id number]   GUR#:[id number]  Hematology follow-up note  Subjective: Patient still complains of extreme fatigue.  He was initially scheduled for TEE today, but it was canceled by cardiology service due to thrombocytopenia.  He does not have extensive ecchymosis, but no other active bleeding.   Objective:  Vitals:   02/19/23 0809 02/19/23 1542  BP: (!) 96/51 (!) 97/58  Pulse: 72 64  Resp: 16 16  Temp: 98.9 F (37.2 C) 98.1 F (36.7 C)  SpO2: 96% 99%    Body mass index is 25.41 kg/m. No intake or output data in the 24 hours ending 02/19/23 1728   Sclerae icteric  Oropharynx clear  No peripheral adenopathy  Lungs clear -- no rales or rhonchi  Heart regular rate and rhythm  Abdomen benign  MSK no focal spinal tenderness, no peripheral edema  Neuro nonfocal   CBG (last 3)  Recent Labs    02/19/23 0831 02/19/23 1147 02/19/23 1702  GLUCAP 90 101* 161*     Labs:    Urine Studies No results for input(s): "UHGB", "CRYS" in the last 72 hours.  Invalid input(s): "UACOL", "UAPR", "USPG", "UPH", "UTP", "UGL", "UKET", "UBIL", "UNIT", "UROB", "ULEU", "UEPI", "UWBC", "URBC", "UBAC", "CAST", "UCOM", "BILUA"  Basic Metabolic Panel: Recent Labs  Lab 02/15/23 0728 02/16/23 0755 02/17/23 0159 02/18/23 0304 02/19/23 0625  NA 138 138 134* 132* 133*  K 4.8 3.9 4.3 4.3 4.0  CL 104 108 105 104 107  CO2 18* 21* 21* 21* 21*  GLUCOSE 185* 142* 188* 186* 103*  BUN 23 20 27* 26* 24*  CREATININE 1.00 1.16 1.00 1.15 1.07  CALCIUM 8.6* 7.8* 7.7* 7.7* 7.6*  MG  --  2.0 2.0 2.0 1.9  PHOS 4.7*  --   --   --   --    GFR Estimated Creatinine Clearance: 53.3 mL/min (by C-G formula based on SCr of 1.07 mg/dL). Liver Function Tests: Recent Labs  Lab 02/15/23 0728 02/16/23 0755 02/17/23 0159 02/18/23 0304 02/19/23 0625  AST 116* 68* 72* 70* 64*  ALT 182* 143* 150* 114* 50*  ALKPHOS 60 48 50 47 50  BILITOT 12.8* 10.0* 10.2* 6.3* 6.2*  PROT 6.1*  4.9* 5.1* 5.0* 4.8*  ALBUMIN 3.2* 2.4* 2.3* 2.3* 2.2*   No results for input(s): "LIPASE", "AMYLASE" in the last 168 hours. Recent Labs  Lab 02/15/23 0728  AMMONIA 19   Coagulation profile Recent Labs  Lab 02/15/23 0728 02/18/23 0304 02/19/23 0625  INR 1.9* 1.9* 1.9*    CBC: Recent Labs  Lab 02/15/23 0728 02/16/23 0755 02/16/23 1624 02/17/23 0159 02/18/23 0304 02/19/23 0625  WBC 8.5 5.4  --  6.9 9.8 11.8*  NEUTROABS 7.6  --   --   --   --   --   HGB 7.4* 6.2* 7.4* 7.5* 7.0* 9.1*  HCT 24.6* 19.6* 23.2* 23.1* 22.5* 28.0*  MCV 137.4* 128.1*  --  117.9* 119.7* 109.8*  PLT 103* 48*  --  38* 36*  35* 34*   Cardiac Enzymes: No results for input(s): "CKTOTAL", "CKMB", "CKMBINDEX", "TROPONINI" in the last 168 hours. BNP: Invalid input(s): "POCBNP" CBG: Recent Labs  Lab 02/18/23 1631 02/18/23 2054 02/19/23 0831 02/19/23 1147 02/19/23 1702  GLUCAP 185* 136* 90 101* 161*   D-Dimer Recent Labs    02/18/23 0304  DDIMER 5.80*   Hgb A1c Recent Labs    02/19/23 0625  HGBA1C 5.6   Lipid Profile No results for input(s): "CHOL", "HDL", "  LDLCALC", "TRIG", "CHOLHDL", "LDLDIRECT" in the last 72 hours. Thyroid function studies No results for input(s): "TSH", "T4TOTAL", "T3FREE", "THYROIDAB" in the last 72 hours.  Invalid input(s): "FREET3" Anemia work up No results for input(s): "VITAMINB12", "FOLATE", "FERRITIN", "TIBC", "IRON", "RETICCTPCT" in the last 72 hours. Microbiology Recent Results (from the past 240 hour(s))  Blood Culture (routine x 2)     Status: Abnormal   Collection Time: 02/15/23  7:02 AM   Specimen: BLOOD LEFT FOREARM  Result Value Ref Range Status   Specimen Description   Final    BLOOD LEFT FOREARM BOTTLES DRAWN AEROBIC AND ANAEROBIC Performed at [facility], [address], [place], [place] [address]    Special Requests   Final    Blood Culture results may not be optimal due to an inadequate volume of blood received in culture  bottles Performed at [facility], [address], [place], [place] [address]    Culture  Setup Time   Final    GRAM POSITIVE COCCI IN BOTH AEROBIC AND ANAEROBIC BOTTLES Gram Stain Report Called to,Read Back By and Verified With: S RICH AT 0752 ON [id number] BY S DALTON CRITICAL VALUE NOTED.  VALUE IS CONSISTENT WITH PREVIOUSLY REPORTED AND CALLED VALUE. CRITICAL RESULT CALLED TO, READ BACK BY AND VERIFIED WITH: [place] [facility] 2440 [phone], Darrin Nipper Performed at [facility], [address], [place], [place] [address]    Culture (A)  Final    STAPHYLOCOCCUS AUREUS SUSCEPTIBILITIES PERFORMED ON PREVIOUS CULTURE WITHIN THE LAST 5 DAYS. Performed at [facility], [address] [place], [place] [address]    Report Status 02/18/2023 FINAL  Final  Resp panel by RT-PCR (RSV, Flu A&B, Covid) Anterior Nasal Swab     Status: None   Collection Time: 02/15/23  7:17 AM   Specimen: Anterior Nasal Swab  Result Value Ref Range Status   SARS Coronavirus 2 by RT PCR NEGATIVE NEGATIVE Final    Comment: (NOTE) SARS-CoV-2 target nucleic acids are NOT DETECTED.  The SARS-CoV-2 RNA is generally detectable in upper respiratory specimens during the acute phase of infection. The lowest concentration of SARS-CoV-2 viral copies this assay can detect is 138 copies/mL. A negative result does not preclude SARS-Cov-2 infection and should not be used as the sole basis for treatment or other patient management decisions. A negative result may occur with  improper specimen collection/handling, submission of specimen other than nasopharyngeal swab, presence of viral mutation(s) within the areas targeted by this assay, and inadequate number of viral copies(<138 copies/mL). A negative result must be combined with clinical observations, patient history, and epidemiological information. The expected result is Negative.  Fact Sheet for Patients:  BloggerCourse.com  Fact Sheet for  Healthcare Providers:  SeriousBroker.it  This test is no t yet approved or cleared by the [place] FDA and  has been authorized for detection and/or diagnosis of SARS-CoV-2 by FDA under an Emergency Use Authorization (EUA). This EUA will remain  in effect (meaning this test can be used) for the duration of the COVID-19 declaration under Section 564(b)(1) of the Act, 21 U.S.C.section 360bbb-3(b)(1), unless the authorization is terminated  or revoked sooner.       Influenza A by PCR NEGATIVE NEGATIVE Final   Influenza B by PCR NEGATIVE NEGATIVE Final    Comment: (NOTE) The Xpert Xpress SARS-CoV-2/FLU/RSV plus assay is intended as an aid in the diagnosis of influenza from Nasopharyngeal swab specimens and should not be used as a sole basis for treatment. Nasal  washings and aspirates are unacceptable for Xpert Xpress SARS-CoV-2/FLU/RSV testing.  Fact Sheet for Patients: BloggerCourse.com  Fact Sheet for Healthcare Providers: SeriousBroker.it  This test is not yet approved or cleared by the [place] FDA and has been authorized for detection and/or diagnosis of SARS-CoV-2 by FDA under an Emergency Use Authorization (EUA). This EUA will remain in effect (meaning this test can be used) for the duration of the COVID-19 declaration under Section 564(b)(1) of the Act, 21 U.S.C. section 360bbb-3(b)(1), unless the authorization is terminated or revoked.     Resp Syncytial Virus by PCR NEGATIVE NEGATIVE Final    Comment: (NOTE) Fact Sheet for Patients: BloggerCourse.com  Fact Sheet for Healthcare Providers: SeriousBroker.it  This test is not yet approved or cleared by the [place] FDA and has been authorized for detection and/or diagnosis of SARS-CoV-2 by FDA under an Emergency Use Authorization (EUA). This EUA will remain in effect (meaning this  test can be used) for the duration of the COVID-19 declaration under Section 564(b)(1) of the Act, 21 U.S.C. section 360bbb-3(b)(1), unless the authorization is terminated or revoked.  Performed at [facility], [address], [place], [place] [address]   Blood Culture (routine x 2)     Status: Abnormal   Collection Time: 02/15/23  7:28 AM   Specimen: BLOOD RIGHT ARM  Result Value Ref Range Status   Specimen Description   Final    BLOOD RIGHT ARM BOTTLES DRAWN AEROBIC AND ANAEROBIC Performed at [facility], [address], [place], [place] [address]    Special Requests   Final    Blood Culture adequate volume Performed at [facility], [address], [place], [place] [address]    Culture  Setup Time   Final    GRAM POSITIVE COCCI IN BOTH AEROBIC AND ANAEROBIC BOTTLES Gram Stain Report Called to,Read Back By and Verified With: S RICH AT 0752 ON [id number] BY S DALTON CRITICAL RESULT CALLED TO, READ BACK BY AND VERIFIED WITH: PHARMD JESSICA MILLEN [id number] AT 1414 BY EC Performed at [facility], [address], [place], [place] [address]    Culture STAPHYLOCOCCUS AUREUS (A)  Final   Report Status 02/18/2023 FINAL  Final   Organism ID, Bacteria STAPHYLOCOCCUS AUREUS  Final      Susceptibility   Staphylococcus aureus - MIC*    CIPROFLOXACIN <=0.5 SENSITIVE Sensitive     ERYTHROMYCIN <=0.25 SENSITIVE Sensitive     GENTAMICIN <=0.5 SENSITIVE Sensitive     OXACILLIN 0.5 SENSITIVE Sensitive     TETRACYCLINE <=1 SENSITIVE Sensitive     VANCOMYCIN <=0.5 SENSITIVE Sensitive     TRIMETH/SULFA <=10 SENSITIVE Sensitive     CLINDAMYCIN <=0.25 SENSITIVE Sensitive     RIFAMPIN <=0.5 SENSITIVE Sensitive     Inducible Clindamycin NEGATIVE Sensitive     LINEZOLID 2 SENSITIVE Sensitive     * STAPHYLOCOCCUS AUREUS  Blood Culture ID Panel (Reflexed)     Status: Abnormal   Collection Time: 02/15/23  7:28 AM  Result Value Ref Range Status   Enterococcus faecalis NOT DETECTED NOT DETECTED Final    Enterococcus Faecium NOT DETECTED NOT DETECTED Final   Listeria monocytogenes NOT DETECTED NOT DETECTED Final   Staphylococcus species DETECTED (A) NOT DETECTED Final    Comment: CRITICAL RESULT CALLED TO, READ BACK BY AND VERIFIED WITH: PHARMD JESSICA MILLEN [id number] AT 1414 BY EC    Staphylococcus aureus (BCID) DETECTED (A) NOT DETECTED Final    Comment: CRITICAL RESULT CALLED TO, READ BACK BY AND VERIFIED  WITH: PAHRAM JESSIC MILLEN [id number] AT 1414 BY EC    Staphylococcus epidermidis NOT DETECTED NOT DETECTED Final   Staphylococcus lugdunensis NOT DETECTED NOT DETECTED Final   Streptococcus species NOT DETECTED NOT DETECTED Final   Streptococcus agalactiae NOT DETECTED NOT DETECTED Final   Streptococcus pneumoniae NOT DETECTED NOT DETECTED Final   Streptococcus pyogenes NOT DETECTED NOT DETECTED Final   A.calcoaceticus-baumannii NOT DETECTED NOT DETECTED Final   Bacteroides fragilis NOT DETECTED NOT DETECTED Final   Enterobacterales NOT DETECTED NOT DETECTED Final   Enterobacter cloacae complex NOT DETECTED NOT DETECTED Final   Escherichia coli NOT DETECTED NOT DETECTED Final   Klebsiella aerogenes NOT DETECTED NOT DETECTED Final   Klebsiella oxytoca NOT DETECTED NOT DETECTED Final   Klebsiella pneumoniae NOT DETECTED NOT DETECTED Final   Proteus species NOT DETECTED NOT DETECTED Final   Salmonella species NOT DETECTED NOT DETECTED Final   Serratia marcescens NOT DETECTED NOT DETECTED Final   Haemophilus influenzae NOT DETECTED NOT DETECTED Final   Neisseria meningitidis NOT DETECTED NOT DETECTED Final   Pseudomonas aeruginosa NOT DETECTED NOT DETECTED Final   Stenotrophomonas maltophilia NOT DETECTED NOT DETECTED Final   Candida albicans NOT DETECTED NOT DETECTED Final   Candida auris NOT DETECTED NOT DETECTED Final   Candida glabrata NOT DETECTED NOT DETECTED Final   Candida krusei NOT DETECTED NOT DETECTED Final   Candida parapsilosis NOT DETECTED NOT DETECTED Final    Candida tropicalis NOT DETECTED NOT DETECTED Final   Cryptococcus neoformans/gattii NOT DETECTED NOT DETECTED Final   Meth resistant mecA/C and MREJ NOT DETECTED NOT DETECTED Final    Comment: Performed at [facility], [address] [place], [place] [address]  MRSA Next Gen by PCR, Nasal     Status: None   Collection Time: 02/15/23 11:03 AM   Specimen: Nasal Mucosa; Nasal Swab  Result Value Ref Range Status   MRSA by PCR Next Gen NOT DETECTED NOT DETECTED Final    Comment: (NOTE) The GeneXpert MRSA Assay (FDA approved for NASAL specimens only), is one component of a comprehensive MRSA colonization surveillance program. It is not intended to diagnose MRSA infection nor to guide or monitor treatment for MRSA infections. Test performance is not FDA approved in patients less than 62 years old. Performed at [facility], [address], [place], [place] [address]   Culture, blood (Routine X 2) w Reflex to ID Panel     Status: None (Preliminary result)   Collection Time: 02/17/23  5:42 AM   Specimen: BLOOD  Result Value Ref Range Status   Specimen Description BLOOD LEFT ANTECUBITAL  Final   Special Requests   Final    BOTTLES DRAWN AEROBIC AND ANAEROBIC Blood Culture results may not be optimal due to an inadequate volume of blood received in culture bottles   Culture   Final    NO GROWTH 2 DAYS Performed at [facility], [address], [place], [place] [address]    Report Status PENDING  Incomplete  Culture, blood (Routine X 2) w Reflex to ID Panel     Status: None (Preliminary result)   Collection Time: 02/17/23  5:43 AM   Specimen: BLOOD LEFT FOREARM  Result Value Ref Range Status   Specimen Description BLOOD LEFT FOREARM  Final   Special Requests   Final    BOTTLES DRAWN AEROBIC AND ANAEROBIC Blood Culture results may not be optimal due to an inadequate volume of blood received in culture bottles   Culture  Final    NO GROWTH 2 DAYS Performed at [facility], [address], [place], [place] [address]    Report Status PENDING  Incomplete      Studies:  MR ABDOMEN MRCP W WO CONTAST  Result Date: 02/18/2023 CLINICAL DATA:  Jaundice, cholelithiasis EXAM: MRI ABDOMEN WITHOUT AND WITH CONTRAST (INCLUDING MRCP) TECHNIQUE: Multiplanar multisequence MR imaging of the abdomen was performed both before and after the administration of intravenous contrast. Heavily T2-weighted images of the biliary and pancreatic ducts were obtained, and three-dimensional MRCP images were rendered by post processing. CONTRAST:  6mL GADAVIST GADOBUTROL 1 MMOL/ML IV SOLN COMPARISON:  CT chest abdomen pelvis, 02/15/2023, right upper quadrant ultrasound, 02/15/2023 FINDINGS: Lower chest: Cardiomegaly.  Small bilateral pleural effusions. Hepatobiliary: No solid liver abnormality is seen. Hepatic signal inversion on in and opposed phase imaging as well as intrinsic T2 hypointensity of the liver. The gallbladder is filled with small gallstones (series 5, image 20). No gallbladder wall thickening or pericholecystic fluid. No biliary ductal dilatation. Pancreas: Unremarkable. No pancreatic ductal dilatation or surrounding inflammatory changes. Spleen: Normal in size. Splenic signal inversion on in and opposed phase imaging as well as intrinsic T2 hypointensity of the spleen. Adrenals/Urinary Tract: Adrenal glands are unremarkable. Simple, benign bilateral renal cortical cysts, for which no further follow-up or characterization is required. Kidneys are otherwise normal, without obvious renal calculi, solid lesion, or hydronephrosis. Stomach/Bowel: Stomach is within normal limits. No evidence of bowel wall thickening, distention, or inflammatory changes. Vascular/Lymphatic: Aortic atherosclerosis. No enlarged abdominal lymph nodes. Other: No abdominal wall hernia.  Anasarca.  No ascites. Musculoskeletal: No acute or significant osseous findings. IMPRESSION: 1. Cholelithiasis without evidence of acute  cholecystitis. 2. No biliary ductal dilatation or choledocholithiasis. 3. Hepatic and splenic signal characteristics consistent with iron deposition. 4. Cardiomegaly. 5. Small bilateral pleural effusions. 6. Anasarca. Aortic Atherosclerosis (ICD10-I70.0). Electronically Signed   By: Jearld Lesch M.D.   On: 02/18/2023 08:28   MR 3D Recon At Scanner  Result Date: 02/18/2023 CLINICAL DATA:  Jaundice, cholelithiasis EXAM: MRI ABDOMEN WITHOUT AND WITH CONTRAST (INCLUDING MRCP) TECHNIQUE: Multiplanar multisequence MR imaging of the abdomen was performed both before and after the administration of intravenous contrast. Heavily T2-weighted images of the biliary and pancreatic ducts were obtained, and three-dimensional MRCP images were rendered by post processing. CONTRAST:  6mL GADAVIST GADOBUTROL 1 MMOL/ML IV SOLN COMPARISON:  CT chest abdomen pelvis, 02/15/2023, right upper quadrant ultrasound, 02/15/2023 FINDINGS: Lower chest: Cardiomegaly.  Small bilateral pleural effusions. Hepatobiliary: No solid liver abnormality is seen. Hepatic signal inversion on in and opposed phase imaging as well as intrinsic T2 hypointensity of the liver. The gallbladder is filled with small gallstones (series 5, image 20). No gallbladder wall thickening or pericholecystic fluid. No biliary ductal dilatation. Pancreas: Unremarkable. No pancreatic ductal dilatation or surrounding inflammatory changes. Spleen: Normal in size. Splenic signal inversion on in and opposed phase imaging as well as intrinsic T2 hypointensity of the spleen. Adrenals/Urinary Tract: Adrenal glands are unremarkable. Simple, benign bilateral renal cortical cysts, for which no further follow-up or characterization is required. Kidneys are otherwise normal, without obvious renal calculi, solid lesion, or hydronephrosis. Stomach/Bowel: Stomach is within normal limits. No evidence of bowel wall thickening, distention, or inflammatory changes. Vascular/Lymphatic: Aortic  atherosclerosis. No enlarged abdominal lymph nodes. Other: No abdominal wall hernia.  Anasarca.  No ascites. Musculoskeletal: No acute or significant osseous findings. IMPRESSION: 1. Cholelithiasis without evidence of acute cholecystitis. 2. No biliary ductal dilatation or choledocholithiasis. 3. Hepatic  and splenic signal characteristics consistent with iron deposition. 4. Cardiomegaly. 5. Small bilateral pleural effusions. 6. Anasarca. Aortic Atherosclerosis (ICD10-I70.0). Electronically Signed   By: Jearld Lesch M.D.   On: 02/18/2023 08:28    Assessment: 82 y.o. male   MSSA bacteremia Transaminitis and hyperbilirubinemia Anemia and thrombocytopenia MDS Type 2 diabetes A-fib Chronic systolic heart failure CPPD and rheumatoid arthritis PAD and bilateral BKA   Plan:  -Lab reviewed, thrombocytopenia overall stable, he received additional blood transfusion yesterday, and hemoglobin improved -His total bilirubin has trended down significantly, likely related to treated for bacteremia -No strong evidence of hemolysis, Coombs test was negative -Continue blood transfusion if hemoglobin less than 7.5.  If he needs TEE, please give 1 unit of platelet transfusion before procedure -He previously failed ESA, on Reblozyl injections as outpatient, no need to [facility] in the hospital. -I will f/u as needed, please call us when he is ready to be discharged, and now discussed with his wife to see if he wants to transfer his hematology care to any pain hospital.   Malachy Mood, MD 02/19/2023  5:28 PM

## 2023-02-19 NOTE — Evaluation (Signed)
Occupational Therapy Evaluation Patient Details Name: David Joseph MRN: [id number] DOB: 09-Mar-1941 Today's Date: 02/19/2023   History of Present Illness 82 yo male presents to [facility] on 11/20 with generalized weakness and lower right sided abdominal pain x6 months. CT of abdomen showed possible acute diverticulitis and gallbladder with sludge and some gallbladder wall thickening. Pt also with L residual limb infection, + MSSA Bacteremia. 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   Clinical Impression   Prior to this admission, patient requiring assist for all transfers (sliding board) and assist to complete ADLs. Patient no longer drives and has assist for all IADL management. Currently, patient presenting with lethargy, potential increased confusion, and need for significant assist to complete bed mobility and ADLs. Patient requiring frequent redirection in order to maintain alert affect, and multi-modal cues in order to complete bed mobility. Patient is currently a mod-max A of 2 to complete bed mobility, and mod-max A in order to complete ADLs. OT recommending rehab at a lesser intensity venue < 3 hours in order to return to prior level of function. However, due to current level, OT would welcome goals of care meeting to promote best quality of life.       If plan is discharge home, recommend the following: Two people to help with walking and/or transfers;A lot of help with bathing/dressing/bathroom;Assistance with cooking/housework;Assistance with feeding;Direct supervision/assist for medications management;Direct supervision/assist for financial management;Assist for transportation;Help with stairs or ramp for entrance;Supervision due to cognitive status    Functional Status Assessment  Patient has had a recent decline in their functional status and demonstrates the ability to make significant  improvements in function in a reasonable and predictable amount of time.  Equipment Recommendations  Other (comment) (Defer to next venue)    Recommendations for Other Services Other (comment) ([facility])     Precautions / Restrictions Precautions Precautions: Fall Restrictions Weight Bearing Restrictions: No      Mobility Bed Mobility Overal bed mobility: Needs Assistance Bed Mobility: Rolling Rolling: Max assist, +2 for safety/equipment, +2 for physical assistance, Mod assist         General bed mobility comments: unable to come into sitting and requiring increased assist to roll    Transfers                   General transfer comment: unable to attempt due to lethargy and increased need for assist for bed level mobility      Balance Overall balance assessment: Needs assistance, History of Falls                                         ADL either performed or assessed with clinical judgement   ADL Overall ADL's : Needs assistance/impaired Eating/Feeding: NPO   Grooming: Moderate assistance;Bed level   Upper Body Bathing: Moderate assistance;Bed level   Lower Body Bathing: Total assistance;Bed level   Upper Body Dressing : Moderate assistance;Bed level   Lower Body Dressing: Total assistance;Bed level   Toilet Transfer: Maximal assistance;+2 for physical assistance;+2 for safety/equipment   Toileting- Clothing Manipulation and Hygiene: Maximal assistance;Bed level;+2 for physical assistance;+2 for safety/equipment       Functional mobility during ADLs: Moderate assistance;+2 for physical assistance;+2 for safety/equipment;Cueing for safety;Cueing for sequencing General ADL Comments: Patient presenting with lethargy, potential increased confusion, and need  for significant assist to complete bed mobility and ADLs. Patient requiring frequent redirection in order to maintain alert affect, and multi-modal cues in order to complete bed  mobility. Patient is currently a mod-max A of 2 to complete bed mobility, and mod-max A in order to complete ADLs. OT recommending rehab at a lesser intensity venue < 3 hours in order to return to prior level of function. However, due to current level, OT would welcome goals of care meeting to promote best quality of life.     Vision Baseline Vision/History: 1 Wears glasses Ability to See in Adequate Light: 0 Adequate Patient Visual Report: No change from baseline Vision Assessment?: No apparent visual deficits Additional Comments: Will continue to assess when more alert     Perception Perception: Not tested       Praxis Praxis: Not tested       Pertinent Vitals/Pain Pain Assessment Pain Assessment: Faces Faces Pain Scale: Hurts whole lot Pain Location: generalized, could not specify Pain Descriptors / Indicators: Guarding, Grimacing Pain Intervention(s): Limited activity within patient's tolerance, Monitored during session, Repositioned     Extremity/Trunk Assessment Upper Extremity Assessment Upper Extremity Assessment: Generalized weakness   Lower Extremity Assessment Lower Extremity Assessment: Defer to PT evaluation   Cervical / Trunk Assessment Cervical / Trunk Assessment: Kyphotic   Communication Communication Communication: Hearing impairment Cueing Techniques: Verbal cues;Tactile cues;Visual cues   Cognition Arousal: Lethargic Behavior During Therapy: Restless Overall Cognitive Status: History of cognitive impairments - at baseline                                 General Comments: Appears to be more confused than his baseline, significant difficulty maintaining attention to basic questions, could not keep eyes open.     General Comments       Exercises     Shoulder Instructions      Home Living Family/patient expects to be discharged to:: Private residence Living Arrangements: Spouse/significant other Available Help at Discharge:  Family;Available PRN/intermittently Type of Home: House Home Access: Ramped entrance     Home Layout: One level     Bathroom Shower/Tub: Walk-in shower         Home Equipment: Agricultural consultant (2 wheels);Wheelchair - power;Grab bars - toilet;Grab bars - tub/shower;BSC/3in1;Toilet riser          Prior Functioning/Environment Prior Level of Function : Needs assist             Mobility Comments: has been transfer level only to/from electric wheelchair. uses slide board for all transfers ADLs Comments: aide comes and helps him bathe 1x/week. pt uses urinal to urinate, goes to [facility] sitting over commode for BM        OT Problem List: Decreased strength;Decreased activity tolerance;Decreased range of motion;Impaired balance (sitting and/or standing);Decreased coordination;Decreased cognition;Decreased safety awareness;Decreased knowledge of use of DME or AE;Pain      OT Treatment/Interventions: Therapeutic exercise;Self-care/ADL training;Energy conservation;DME and/or AE instruction;Manual therapy;Therapeutic activities;Cognitive remediation/compensation;Balance training;Patient/family education    OT Goals(Current goals can be found in the care plan section) Acute Rehab OT Goals Patient Stated Goal: unable OT Goal Formulation: Patient unable to participate in goal setting Time For Goal Achievement: 03/05/23 Potential to Achieve Goals: Fair ADL Goals Pt Will Perform Lower Body Bathing: with mod assist;sitting/lateral leans Pt Will Perform Lower Body Dressing: with mod assist;sitting/lateral leans Pt Will Transfer to Toilet: with transfer board;with mod assist;bedside commode Pt Will Perform Toileting -  Clothing Manipulation and hygiene: with mod assist;sitting/lateral leans Additional ADL Goal #1: Patient will complete bed mobility at min A level as a precursor to OOB activity. Additional ADL Goal #2: Patient will be able to follow 2 step commands without cues to recall in order  to promote increased cognitive awareness.  OT Frequency: Min 1X/week    Co-evaluation              AM-PAC OT "6 Clicks" Daily Activity     Outcome Measure Help from another person eating meals?: Total (NPO) Help from another person taking care of personal grooming?: A Little Help from another person toileting, which includes using toliet, bedpan, or urinal?: Total Help from another person bathing (including washing, rinsing, drying)?: A Lot Help from another person to put on and taking off regular upper body clothing?: A Lot Help from another person to put on and taking off regular lower body clothing?: A Lot 6 Click Score: 11   End of Session Nurse Communication: Mobility status  Activity Tolerance: Patient limited by fatigue;Patient limited by lethargy;Patient limited by pain Patient left: in bed;with call bell/phone within reach;with bed alarm set  OT Visit Diagnosis: Unsteadiness on feet (R26.81);Other abnormalities of gait and mobility (R26.89);Repeated falls (R29.6);Muscle weakness (generalized) (M62.81);History of falling (Z91.81);Other symptoms and signs involving cognitive function;Pain;Adult, failure to thrive (R62.7)                Time: 5284-1324 OT Time Calculation (min): 15 min Charges:  OT General Charges $OT Visit: 1 Visit OT Evaluation $OT Eval Moderate Complexity: 1 Mod  David Joseph, OTR/L Acute Rehabilitation Services [phone]   Cherlyn Cushing 02/19/2023, 11:22 AM

## 2023-02-19 NOTE — Progress Notes (Signed)
[facility]  PROGRESS NOTE  David Joseph:[id number] DOB: [birth year] DOA: 02/15/2023 PCP: Donetta Potts, MD   Brief HPI:   82 y.o. 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  On presentation, he was febrile to 101.4 with initial lactic acid of 8 which improved to 3 after IV fluids.  AST of 116 and ALT of 182 with total bilirubin of 12.8.  Influenza/COVID-19/RSV PCR negative.  CT of abdomen showed possible acute diverticulitis and gallbladder with sludge and some gallbladder wall thickening.  Ultrasound abdomen equivocal for acute cholecystitis and negative Murphy sign.  He was started on IV antibiotics.  General surgery at [facility] recommended HIDA scan and if positive, consider cholecystostomy drain.  He was transferred to [facility] for the same.  GI and general surgery were consulted at [facility].  ID was auto consulted for MSSA bacteremia.  Heme-onc was consulted on 02/17/2023.     Assessment/Plan:   Abdominal pain and hyperbilirubinemia and elevated LFTs -Imaging and labs as above.   -General surgery at [facility] recommended HIDA scan and if positive, consider cholecystostomy drain.  He was transferred to [facility] for the same.   -Continue antibiotics as per ID recommendations. -Blood cultures as below. -General Surgery following: General surgery not convinced that patient has acute cholecystitis.  HIDA scan canceled by general surgery.  GI following as well.  MRCP showed cholelithiasis without evidence of acute cholecystitis -LFTs improving as well including total bilirubin.   Possible acute diverticulitis -As  seen on imaging.  GI is not convinced that patient has diverticulitis.  Continue current antibiotics.   MSSA bacteremia -Antibiotics have been switched to IV Ancef and Flagyl by ID.  Possible source from left stump wound.   Also had Port-A-Cath which was removed by general surgery yesterday  Follow repeat blood cultures from 02/17/2023.   Cardiology cancel TEE due to thrombocytopenia, platelet count should be greater than 50,000 before procedure    Lactic acidosis -Possibly from above.  Improving   Thrombocytopenia -Patient has chronic thrombocytopenia but has worsened since admission.  Platelets 34 today.  Heme-onc following. -Recommend to give 1 unit of platelets before procedure   Acute metabolic acidosis -Mild.  Monitor.   Diabetes mellitus type 2 with hyperglycemia -Continue with with SSI.   -CBG well-controlled   Paroxysmal A-fib -Rate currently controlled.  Eliquis on hold.  Heparin drip held because of anemia and thrombocytopenia.   Myelodysplastic syndrome -Patient had Reblozyl injection on 02/11/2023.   Follows with oncology at [facility].   Anemia of chronic disease -From chronic illness.  Hemoglobin dropped to 6.2 on 02/16/2023.  Status post 2 unit packed red cell transfusion.   -Hemoglobin is 9.1 today   Chronic systolic heart failure -Currently compensated.  Strict input and output and daily weights.  Continue metoprolol succinate.  Outpatient follow-up with cardiology   CPPD/RA -On Plaquenil and prednisone.   Outpatient follow-up with rheumatology   Ramsay Hunt syndrome -Continue Valtrex daily   Bilateral BKA with history of PAD    Medications     sodium chloride   Intravenous Once   ascorbic acid  500 mg Oral Daily   Chlorhexidine Gluconate Cloth  6 each Topical Daily  cholecalciferol  1,000 Units Oral Daily   hydroxychloroquine  200 mg Oral Daily   insulin aspart  0-15 Units Subcutaneous TID WC   insulin aspart  0-5 Units Subcutaneous QHS    metoprolol succinate  12.5 mg Oral BID   pantoprazole  40 mg Oral Daily   potassium chloride SA  20 mEq Oral Daily   predniSONE  15 mg Oral Daily   pregabalin  100 mg Oral TID   valACYclovir  500 mg Oral Daily     Data Reviewed:   CBG:  Recent Labs  Lab 02/18/23 1131 02/18/23 1631 02/18/23 2054 02/19/23 0831 02/19/23 1147  GLUCAP 169* 185* 136* 90 101*    SpO2: 96 % O2 Flow Rate (L/min): 6 L/min FiO2 (%): (!) 0 %    Vitals:   02/18/23 2353 02/19/23 0359 02/19/23 0400 02/19/23 0809  BP: (!) 104/51  104/89 (!) 96/51  Pulse: 80  78 72  Resp: 16  18 16   Temp: 98 F (36.7 C)  99.7 F (37.6 C) 98.9 F (37.2 C)  TempSrc: Oral  Oral Oral  SpO2: 92%  100% 96%  Weight:  75.8 kg    Height:          Data Reviewed:  Basic Metabolic Panel: Recent Labs  Lab 02/15/23 0728 02/16/23 0755 02/17/23 0159 02/18/23 0304 02/19/23 0625  NA 138 138 134* 132* 133*  K 4.8 3.9 4.3 4.3 4.0  CL 104 108 105 104 107  CO2 18* 21* 21* 21* 21*  GLUCOSE 185* 142* 188* 186* 103*  BUN 23 20 27* 26* 24*  CREATININE 1.00 1.16 1.00 1.15 1.07  CALCIUM 8.6* 7.8* 7.7* 7.7* 7.6*  MG  --  2.0 2.0 2.0 1.9  PHOS 4.7*  --   --   --   --     CBC: Recent Labs  Lab 02/15/23 0728 02/16/23 0755 02/16/23 1624 02/17/23 0159 02/18/23 0304 02/19/23 0625  WBC 8.5 5.4  --  6.9 9.8 11.8*  NEUTROABS 7.6  --   --   --   --   --   HGB 7.4* 6.2* 7.4* 7.5* 7.0* 9.1*  HCT 24.6* 19.6* 23.2* 23.1* 22.5* 28.0*  MCV 137.4* 128.1*  --  117.9* 119.7* 109.8*  PLT 103* 48*  --  38* 36*  35* 34*    LFT Recent Labs  Lab 02/15/23 0728 02/16/23 0755 02/17/23 0159 02/18/23 0304 02/19/23 0625  AST 116* 68* 72* 70* 64*  ALT 182* 143* 150* 114* 50*  ALKPHOS 60 48 50 47 50  BILITOT 12.8* 10.0* 10.2* 6.3* 6.2*  PROT 6.1* 4.9* 5.1* 5.0* 4.8*  ALBUMIN 3.2* 2.4* 2.3* 2.3* 2.2*     Antibiotics: Anti-infectives (From admission, onward)    Start     Dose/Rate Route Frequency Ordered Stop   02/17/23 0200   ceFAZolin (ANCEF) IVPB 1 g/50 mL premix  Status:  Discontinued        1 g 100 mL/hr over 30 Minutes Intravenous Every 8 hours 02/17/23 0059 02/17/23 0059   02/17/23 0200  ceFAZolin (ANCEF) IVPB 2g/100 mL premix        2 g 200 mL/hr over 30 Minutes Intravenous Every 8 hours 02/17/23 0059     02/16/23 0800  vancomycin (VANCOCIN) IVPB 1000 mg/200 mL premix  Status:  Discontinued        1,000 mg 200 mL/hr over 60 Minutes Intravenous Every 24 hours 02/15/23 0855 02/17/23 0059   02/15/23 2000  ceFEPIme (MAXIPIME) 2 g in  sodium chloride 0.9 % 100 mL IVPB  Status:  Discontinued        2 g 200 mL/hr over 30 Minutes Intravenous Every 12 hours 02/15/23 0855 02/17/23 0059   02/15/23 2000  metroNIDAZOLE (FLAGYL) IVPB 500 mg        500 mg 100 mL/hr over 60 Minutes Intravenous Every 12 hours 02/15/23 1204 02/22/23 1959   02/15/23 1215  hydroxychloroquine (PLAQUENIL) tablet 200 mg        200 mg Oral Daily 02/15/23 1204     02/15/23 1215  valACYclovir (VALTREX) tablet 500 mg        500 mg Oral Daily 02/15/23 1204     02/15/23 0700  ceFEPIme (MAXIPIME) 2 g in sodium chloride 0.9 % 100 mL IVPB        2 g 200 mL/hr over 30 Minutes Intravenous  Once 02/15/23 0658 02/15/23 0856   02/15/23 0700  metroNIDAZOLE (FLAGYL) IVPB 500 mg        500 mg 100 mL/hr over 60 Minutes Intravenous  Once 02/15/23 0658 02/15/23 0856   02/15/23 0700  vancomycin (VANCOCIN) IVPB 1000 mg/200 mL premix        1,000 mg 200 mL/hr over 60 Minutes Intravenous  Once 02/15/23 0658 02/15/23 0856        DVT prophylaxis: Heparin held due to thrombocytopenia  Code Status: Full code  Family Communication: No family at bedside   CONSULTS ID, general surgery, gastroenterology, oncology   Subjective   Denies any complaints   Objective    Physical Examination:   General-appears in no acute distress Heart-S1-S2, regular, no murmur auscultated Lungs-clear to auscultation bilaterally, no wheezing or crackles  auscultated Abdomen-soft, nontender, no organomegaly Extremities-bilateral BKA Neuro-alert, oriented x3, no focal deficit noted   Status is: Inpatient:      Pressure Injury 02/18/23 Coccyx Medial Stage 1 -  Intact skin with non-blanchable redness of a localized area usually over a bony prominence. (Active)  02/18/23 1854  Location: Coccyx  Location Orientation: Medial  Staging: Stage 1 -  Intact skin with non-blanchable redness of a localized area usually over a bony prominence.  Wound Description (Comments):   Present on Admission:         David Joseph   [facility] If 7PM-7AM, please contact night-coverage at [email or web address], Office  [phone]   02/19/2023, 12:24 PM  LOS: 4 days

## 2023-02-20 ENCOUNTER — Other Ambulatory Visit: Payer: Self-pay

## 2023-02-20 DIAGNOSIS — R7881 Bacteremia: Secondary | ICD-10-CM

## 2023-02-20 DIAGNOSIS — R748 Abnormal levels of other serum enzymes: Secondary | ICD-10-CM

## 2023-02-20 DIAGNOSIS — M069 Rheumatoid arthritis, unspecified: Secondary | ICD-10-CM

## 2023-02-20 DIAGNOSIS — Z89511 Acquired absence of right leg below knee: Secondary | ICD-10-CM | POA: Diagnosis not present

## 2023-02-20 DIAGNOSIS — D649 Anemia, unspecified: Secondary | ICD-10-CM

## 2023-02-20 DIAGNOSIS — K819 Cholecystitis, unspecified: Secondary | ICD-10-CM | POA: Diagnosis not present

## 2023-02-20 DIAGNOSIS — B9561 Methicillin susceptible Staphylococcus aureus infection as the cause of diseases classified elsewhere: Secondary | ICD-10-CM | POA: Diagnosis not present

## 2023-02-20 LAB — BASIC METABOLIC PANEL
Anion gap: 10 (ref 5–15)
BUN: 30 mg/dL — ABNORMAL HIGH (ref 8–23)
CO2: 19 mmol/L — ABNORMAL LOW (ref 22–32)
Calcium: 7.6 mg/dL — ABNORMAL LOW (ref 8.9–10.3)
Chloride: 103 mmol/L (ref 98–111)
Creatinine, Ser: 0.92 mg/dL (ref 0.61–1.24)
GFR, Estimated: >60 mL/min (ref >60)
Glucose, Bld: 135 mg/dL — ABNORMAL HIGH (ref 70–99)
Potassium: 4.9 mmol/L (ref 3.5–5.1)
Sodium: 132 mmol/L — ABNORMAL LOW (ref 135–145)

## 2023-02-20 LAB — CBC
HCT: 29.6 % — ABNORMAL LOW (ref 39.0–52.0)
Hemoglobin: 9.8 g/dL — ABNORMAL LOW (ref 13.0–17.0)
MCH: 36.2 pg — ABNORMAL HIGH (ref 26.0–34.0)
MCHC: 33.1 g/dL (ref 30.0–36.0)
MCV: 109.2 fL — ABNORMAL HIGH (ref 80.0–100.0)
Platelets: 22 10*3/uL — CL (ref 150–400)
RBC: 2.71 MIL/uL — ABNORMAL LOW (ref 4.22–5.81)
RDW: 26.6 % — ABNORMAL HIGH (ref 11.5–15.5)
WBC: 12.7 10*3/uL — ABNORMAL HIGH (ref 4.0–10.5)
nRBC: 0.4 % — ABNORMAL HIGH (ref 0.0–0.2)

## 2023-02-20 LAB — GLUCOSE, CAPILLARY
Glucose-Capillary: 247 mg/dL — ABNORMAL HIGH (ref 70–99)
Glucose-Capillary: 170 mg/dL — ABNORMAL HIGH (ref 70–99)
Glucose-Capillary: 187 mg/dL — ABNORMAL HIGH (ref 70–99)
Glucose-Capillary: 216 mg/dL — ABNORMAL HIGH (ref 70–99)

## 2023-02-20 MED ORDER — HYDROXYZINE HCL 10 MG PO TABS
10.0000 mg | ORAL_TABLET | Freq: Three times a day (TID) | ORAL | Status: DC | PRN
  Administered 2023-02-21: 10 mg via ORAL
  Filled 2023-02-20: qty 1

## 2023-02-20 MED ORDER — HYDROCORTISONE 1 % EX OINT
TOPICAL_OINTMENT | CUTANEOUS | Status: DC | PRN
  Administered 2023-02-22: 1 via TOPICAL
  Filled 2023-02-20 (×2): qty 28

## 2023-02-20 NOTE — Progress Notes (Signed)
   02/20/23 1115  Mobility  Activity Turned to left side;Turned to right side;Turned to back - supine  Level of Assistance Maximum assist, patient does 25-49%  Assistive Device Other (Comment) (bed rails)  Range of Motion/Exercises Active  Activity Response Tolerated fair  Mobility Referral Yes  Mobility visit 1 Mobility  Mobility Specialist Start Time (ACUTE ONLY) 1111  Mobility Specialist Stop Time (ACUTE ONLY) 1123  Mobility Specialist Time Calculation (min) (ACUTE ONLY) 12 min   Mobility Specialist: Progress Note  Pt agreeable to mobility session - received in bed - assisted NT with bathing care. Required MaxA+2 using bedrails. Pt was asymptomatic throughout session with no complaints. Returned to supine in bed with all needs met - call bell within reach. Wife and RN present.  Barnie Mort, BS Mobility Specialist Please contact via SecureChat or Rehab office at [phone].

## 2023-02-20 NOTE — Plan of Care (Signed)
  Problem: Education: Goal: Knowledge of General Education information will improve Description Including pain rating scale, medication(s)/side effects and non-pharmacologic comfort measures Outcome: Progressing   

## 2023-02-20 NOTE — Progress Notes (Signed)
[facility]  PROGRESS NOTE  David Joseph YQI:[id number] DOB: [birth year] DOA: 02/15/2023 PCP: Donetta Potts, MD   Brief HPI:   82 y.o. 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  On presentation, he was febrile to 101.4 with initial lactic acid of 8 which improved to 3 after IV fluids.  AST of 116 and ALT of 182 with total bilirubin of 12.8.  Influenza/COVID-19/RSV PCR negative.  CT of abdomen showed possible acute diverticulitis and gallbladder with sludge and some gallbladder wall thickening.  Ultrasound abdomen equivocal for acute cholecystitis and negative Murphy sign.  He was started on IV antibiotics.  General surgery at [facility] recommended HIDA scan and if positive, consider cholecystostomy drain.  He was transferred to [facility] for the same.  GI and general surgery were consulted at [facility].  ID was auto consulted for MSSA bacteremia.  Heme-onc was consulted on 02/17/2023.     Assessment/Plan:   Abdominal pain and hyperbilirubinemia and elevated LFTs -Imaging and labs as above.   -General surgery at [facility] recommended HIDA scan and if positive, consider cholecystostomy drain.  He was transferred to [facility] for the same.   -Continue antibiotics as per ID recommendations. -Blood cultures as below. -General Surgery following: General surgery not convinced that patient has acute cholecystitis.  HIDA scan canceled by general surgery.  GI following as well.  MRCP showed cholelithiasis without evidence of acute cholecystitis -LFTs improving as well including total bilirubin.   Possible acute diverticulitis -As  seen on imaging.  GI is not convinced that patient has diverticulitis.  Continue current antibiotics.   MSSA bacteremia -Antibiotics have been switched to IV Ancef and Flagyl by ID.  Possible source from left stump wound.   Also had Port-A-Cath which was removed by general surgery yesterday  Follow repeat blood cultures from 02/17/2023.   Cardiology cancelled TEE due to thrombocytopenia, platelet count should be greater than 50,000 before procedure  -Discussed with ID, patient will be on 6 weeks of IV antibiotics.  After discussion with patient and his wife, ID has decided not to pursue with TEE.   Lactic acidosis -Possibly from above.  Improving   Thrombocytopenia -Patient has chronic thrombocytopenia but has worsened since admission.  Platelets 22000 today.  Heme-onc following. -Will give 1 unit of platelet transfusion for platelet count less than 20,000   Acute metabolic acidosis -Mild.  Monitor.   Diabetes mellitus type 2 with hyperglycemia -Continue with with SSI.   -CBG well-controlled   Paroxysmal A-fib -Rate currently controlled.  Eliquis on hold.  Heparin drip held because of anemia and thrombocytopenia. -Hold metoprolol for SBP less than 110   Myelodysplastic syndrome -Patient had Reblozyl injection on 02/11/2023.   Follows with oncology at [facility].   Anemia of chronic disease -From chronic illness.  Hemoglobin dropped to 6.2 on 02/16/2023.  Status post 2 unit packed red cell transfusion.   -Hemoglobin is 9.8 today   Chronic systolic heart failure -Currently compensated.  Strict input and output and daily weights.  Continue metoprolol succinate.  Outpatient follow-up with cardiology   CPPD/RA -On Plaquenil and prednisone.   Outpatient follow-up with rheumatology   Ramsay Hunt syndrome -Continue Valtrex daily   Bilateral  BKA with history of PAD    Medications     ascorbic acid  500 mg Oral Daily   Chlorhexidine Gluconate Cloth  6 each Topical Daily    cholecalciferol  1,000 Units Oral Daily   hydroxychloroquine  200 mg Oral Daily   insulin aspart  0-15 Units Subcutaneous TID WC   insulin aspart  0-5 Units Subcutaneous QHS   metoprolol succinate  12.5 mg Oral BID   pantoprazole  40 mg Oral Daily   potassium chloride SA  20 mEq Oral Daily   predniSONE  15 mg Oral Daily   pregabalin  100 mg Oral TID   valACYclovir  500 mg Oral Daily     Data Reviewed:   CBG:  Recent Labs  Lab 02/19/23 0831 02/19/23 1147 02/19/23 1702 02/19/23 2119 02/20/23 0810  GLUCAP 90 101* 161* 159* 247*    SpO2: 100 % O2 Flow Rate (L/min): 6 L/min FiO2 (%): (!) 0 %    Vitals:   02/20/23 0324 02/20/23 0456 02/20/23 0812 02/20/23 0909  BP: (!) 104/58  (!) 101/55 (!) 101/55  Pulse: 63  61   Resp: 18  19   Temp: 97.7 F (36.5 C)     TempSrc: Oral     SpO2: 100%  100%   Weight:  77.1 kg    Height:          Data Reviewed:  Basic Metabolic Panel: Recent Labs  Lab 02/15/23 0728 02/16/23 0755 02/17/23 0159 02/18/23 0304 02/19/23 0625 02/20/23 0534  NA 138 138 134* 132* 133* 132*  K 4.8 3.9 4.3 4.3 4.0 4.9  CL 104 108 105 104 107 103  CO2 18* 21* 21* 21* 21* 19*  GLUCOSE 185* 142* 188* 186* 103* 135*  BUN 23 20 27* 26* 24* 30*  CREATININE 1.00 1.16 1.00 1.15 1.07 0.92  CALCIUM 8.6* 7.8* 7.7* 7.7* 7.6* 7.6*  MG  --  2.0 2.0 2.0 1.9  --   PHOS 4.7*  --   --   --   --   --     CBC: Recent Labs  Lab 02/15/23 0728 02/16/23 0755 02/16/23 1624 02/17/23 0159 02/18/23 0304 02/19/23 0625 02/20/23 0534  WBC 8.5 5.4  --  6.9 9.8 11.8* 12.7*  NEUTROABS 7.6  --   --   --   --   --   --   HGB 7.4* 6.2* 7.4* 7.5* 7.0* 9.1* 9.8*  HCT 24.6* 19.6* 23.2* 23.1* 22.5* 28.0* 29.6*  MCV 137.4* 128.1*  --  117.9* 119.7* 109.8* 109.2*  PLT 103* 48*  --  38* 36*  35* 34* 22*    LFT Recent Labs  Lab 02/15/23 0728 02/16/23 0755 02/17/23 0159 02/18/23 0304 02/19/23 0625  AST 116* 68* 72* 70* 64*  ALT 182* 143* 150* 114* 50*  ALKPHOS 60  48 50 47 50  BILITOT 12.8* 10.0* 10.2* 6.3* 6.2*  PROT 6.1* 4.9* 5.1* 5.0* 4.8*  ALBUMIN 3.2* 2.4* 2.3* 2.3* 2.2*     Antibiotics: Anti-infectives (From admission, onward)    Start     Dose/Rate Route Frequency Ordered Stop   02/17/23 0200  ceFAZolin (ANCEF) IVPB 1 g/50 mL premix  Status:  Discontinued        1 g 100 mL/hr over 30 Minutes Intravenous Every 8 hours 02/17/23 0059 02/17/23 0059   02/17/23 0200  ceFAZolin (ANCEF) IVPB 2g/100 mL premix        2 g 200 mL/hr over 30 Minutes Intravenous Every 8  hours 02/17/23 0059     02/16/23 0800  vancomycin (VANCOCIN) IVPB 1000 mg/200 mL premix  Status:  Discontinued        1,000 mg 200 mL/hr over 60 Minutes Intravenous Every 24 hours 02/15/23 0855 02/17/23 0059   02/15/23 2000  ceFEPIme (MAXIPIME) 2 g in sodium chloride 0.9 % 100 mL IVPB  Status:  Discontinued        2 g 200 mL/hr over 30 Minutes Intravenous Every 12 hours 02/15/23 0855 02/17/23 0059   02/15/23 2000  metroNIDAZOLE (FLAGYL) IVPB 500 mg        500 mg 100 mL/hr over 60 Minutes Intravenous Every 12 hours 02/15/23 1204 02/22/23 1959   02/15/23 1215  hydroxychloroquine (PLAQUENIL) tablet 200 mg        200 mg Oral Daily 02/15/23 1204     02/15/23 1215  valACYclovir (VALTREX) tablet 500 mg        500 mg Oral Daily 02/15/23 1204     02/15/23 0700  ceFEPIme (MAXIPIME) 2 g in sodium chloride 0.9 % 100 mL IVPB        2 g 200 mL/hr over 30 Minutes Intravenous  Once 02/15/23 0658 02/15/23 0856   02/15/23 0700  metroNIDAZOLE (FLAGYL) IVPB 500 mg        500 mg 100 mL/hr over 60 Minutes Intravenous  Once 02/15/23 0658 02/15/23 0856   02/15/23 0700  vancomycin (VANCOCIN) IVPB 1000 mg/200 mL premix        1,000 mg 200 mL/hr over 60 Minutes Intravenous  Once 02/15/23 0658 02/15/23 0856        DVT prophylaxis: Heparin held due to thrombocytopenia  Code Status: Full code  Family Communication: No family at bedside   CONSULTS ID, general surgery, gastroenterology,  oncology   Subjective   Platelet count is down to 22000.  Denies any complaints.   Objective    Physical Examination:   General-appears in no acute distress Heart-S1-S2, regular, no murmur auscultated Lungs-clear to auscultation bilaterally, no wheezing or crackles auscultated Abdomen-soft, nontender, no organomegaly Extremities-no edema in the lower extremities Neuro-alert, oriented x3, no focal deficit noted   Status is: Inpatient:      Pressure Injury 02/18/23 Coccyx Medial Stage 1 -  Intact skin with non-blanchable redness of a localized area usually over a bony prominence. (Active)  02/18/23 1854  Location: Coccyx  Location Orientation: Medial  Staging: Stage 1 -  Intact skin with non-blanchable redness of a localized area usually over a bony prominence.  Wound Description (Comments):   Present on Admission:         Junetta Hearn S Kaneesha Constantino   [facility] If 7PM-7AM, please contact night-coverage at [email or web address], Office  [phone]   02/20/2023, 9:41 AM  LOS: 5 days

## 2023-02-20 NOTE — Progress Notes (Signed)
PHARMACY CONSULT NOTE FOR:  OUTPATIENT  PARENTERAL ANTIBIOTIC THERAPY (OPAT)  Indication: MSSA bacteremia, foregoing TEE Regimen: Cefazolin 2g IV every 8 hours End date: 04/01/23 (6 weeks from [facility] removal 12/3)  IV antibiotic discharge orders are pended. To discharging provider:  please sign these orders via discharge navigator,  Select New Orders & click on the button choice - Manage This Unsigned Work.     Thank you for allowing pharmacy to be a part of this patient's care.  Georgina Pillion, PharmD, BCPS, BCIDP Infectious Diseases Clinical Pharmacist 02/20/2023 10:58 AM   **Pharmacist phone directory can now be found on [email or web address] ([email or web address]).  Listed under [facility].

## 2023-02-20 NOTE — [place] FL2 (Signed)
[place] MEDICAID FL2 LEVEL OF CARE FORM     IDENTIFICATION  Patient Name: David Joseph Birthdate: February 04, 1941 Sex: male Admission Date (Current Location): 02/15/2023  [facility] and [place] Number:  Producer, television/film/video and Address:  [facility] [place], [address] [place], [place] [address]      Provider Number: [id number]  Attending Physician Name and Address:  Meredeth Ide, MD  Relative Name and Phone Number:       Current Level of Care: Hospital Recommended Level of Care: Skilled Nursing Facility Prior Approval Number:    Date Approved/Denied:   PASRR Number: [id number]  Discharge Plan: SNF    Current Diagnoses: Patient Active Problem List   Diagnosis Date Noted   MSSA bacteremia 02/17/2023   Bilirubinemia 02/17/2023   Elevated liver enzymes 02/17/2023   Severe sepsis (HCC) 02/16/2023   Cholecystitis 02/15/2023   PVD (peripheral vascular disease) (HCC) 04/04/2022   Insomnia 04/04/2022   Hypotension 03/26/2022   Chronic systolic congestive heart failure (HCC) 03/22/2022   Anemia 03/22/2022   S/P unilateral BKA (below knee amputation), right (HCC) 03/22/2022   Unilateral complete BKA, right, subsequent encounter (HCC) 03/22/2022   Olecranon bursitis of left elbow 03/21/2022   Rheumatoid arthritis (HCC) 01/22/2016   OA (osteoarthritis) of knee 10/09/2015   Acute blood loss anemia (ABLA) 02/21/2012   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/19/2012    Orientation RESPIRATION BLADDER Height & Weight     Self, Time, Situation, Place  Normal Incontinent Weight: 169 lb 15.6 oz (77.1 kg) Height:  5\' 8"  (172.7 cm)  BEHAVIORAL SYMPTOMS/MOOD NEUROLOGICAL BOWEL NUTRITION STATUS      Incontinent Diet (See DC summary)  AMBULATORY STATUS COMMUNICATION OF NEEDS Skin   Total Care (Bilateral BKA) Verbally PU Stage and Appropriate Care, Skin abrasions (Bilateral BKA, L stump non pressure wound, Chest inc, PI ST1 coccyx)                        Personal Care Assistance Level of Assistance  Bathing, Feeding, Dressing Bathing Assistance: Maximum assistance Feeding assistance: Limited assistance Dressing Assistance: Maximum assistance     Functional Limitations Info  Sight, Hearing, Speech Sight Info: Impaired Hearing Info: Impaired Speech Info: Adequate    SPECIAL CARE FACTORS FREQUENCY  PT (By licensed PT), OT (By licensed OT)     PT Frequency: 5x week OT Frequency: 5x week            Contractures Contractures Info: Not present    Additional Factors Info  Code Status, Allergies Code Status Info: Full Allergies Info: Iodinated Contrast Media           Current Medications (02/20/2023):  This is the current hospital active medication list Current Facility-Administered Medications  Medication Dose Route Frequency Provider Last Rate Last Admin   acetaminophen (TYLENOL) tablet 650 mg  650 mg Oral Q6H PRN Sherryll Burger, Pratik D, DO       Or   acetaminophen (TYLENOL) suppository 650 mg  650 mg Rectal Q6H PRN Sherryll Burger, Pratik D, DO       ascorbic acid (VITAMIN C) tablet 500 mg  500 mg Oral Daily Sherryll Burger, Pratik D, DO   500 mg at 02/20/23 0910   ceFAZolin (ANCEF) IVPB 2g/100 mL premix  2 g Intravenous Q8H Stevphen Rochester, RPH   Stopped at 02/20/23 1133   Chlorhexidine Gluconate Cloth 2 % PADS 6 each  6 each Topical Daily Glade Lloyd, MD  6 each at 02/20/23 1010   cholecalciferol (VITAMIN D3) 25 MCG (1000 UNIT) tablet 1,000 Units  1,000 Units Oral Daily Sherryll Burger, Pratik D, DO   1,000 Units at 02/20/23 2725   HYDROmorphone (DILAUDID) injection 0.5-1 mg  0.5-1 mg Intravenous Q2H PRN Maurilio Lovely D, DO   1 mg at 02/19/23 1025   hydroxychloroquine (PLAQUENIL) tablet 200 mg  200 mg Oral Daily Sherryll Burger, Pratik D, DO   200 mg at 02/20/23 0909   insulin aspart (novoLOG) injection 0-15 Units  0-15 Units Subcutaneous TID WC Alekh, Kshitiz, MD   3 Units at 02/20/23 1227   insulin aspart (novoLOG) injection 0-5 Units  0-5 Units  Subcutaneous QHS Alekh, Kshitiz, MD       metoprolol succinate (TOPROL-XL) 24 hr tablet 12.5 mg  12.5 mg Oral BID Meredeth Ide, MD   12.5 mg at 02/20/23 0909   ondansetron (ZOFRAN) tablet 4 mg  4 mg Oral Q6H PRN Sherryll Burger, Pratik D, DO       Or   ondansetron (ZOFRAN) injection 4 mg  4 mg Intravenous Q6H PRN Sherryll Burger, Pratik D, DO   4 mg at 02/17/23 0411   pantoprazole (PROTONIX) EC tablet 40 mg  40 mg Oral Daily Sherryll Burger, Pratik D, DO   40 mg at 02/20/23 0910   polyethylene glycol (MIRALAX / GLYCOLAX) packet 17 g  17 g Oral Daily PRN Maurilio Lovely D, DO   17 g at 02/20/23 3664   predniSONE (DELTASONE) tablet 15 mg  15 mg Oral Daily Sherryll Burger, Pratik D, DO   15 mg at 02/20/23 4034   pregabalin (LYRICA) capsule 100 mg  100 mg Oral TID Maurilio Lovely D, DO   100 mg at 02/20/23 7425   sodium chloride flush (NS) 0.9 % injection 10-40 mL  10-40 mL Intracatheter PRN Glade Lloyd, MD       valACYclovir (VALTREX) tablet 500 mg  500 mg Oral Daily Sherryll Burger, Pratik D, DO   500 mg at 02/20/23 9563     Discharge Medications: Please see discharge summary for a list of discharge medications.  Relevant Imaging Results:  Relevant Lab Results:   Additional Information SS# [id number], [place]

## 2023-02-20 NOTE — Progress Notes (Signed)
[facility]  Date of Admission:  02/15/2023      Total days of antibiotics 6  Cefazolin          ASSESSMENT: David Joseph is a 82 y.o. male admitted with:    MSSA Bacteremia - C/B Port-A-Cath in situ (removed 12/4) -  BCx (+) 11/30, (-) pending 12/01. Port removed 12/03. Superficial stump wounds appear to be dry and non-tender.  TTE is normal, only some mild thickening of MV without any MR.  -Defer TEE with severe thrombocytopenia - discussion with David Joseph and his wife and will do 6 weeks of IV Cefazolin presumptively   BKA Stump Wounds, Chronic non healing -  All 3 open sores look dry and w/o much erythema. Suspect these were the initial portal of entry. No TTP.  -No further work up necessary. Will be well treated with   Abnormal CT Abdomen -  Stranding adjacent to colon ?diverticulitis, enlarged GB with large amount of biliary sludge. Surgery team following and don't feel diverticulitis is of clinical concern. Indirecte hyperbilirubinemia may be due more to s/e from Reblozyl vs sepsis --> improving. -stop metronidazole  -OP follow up with his GI team   Thrombocytopenia -  As above don't feel the risks of associated TEE are worth the risks. Defer TEE.  Hematology following.    Vascular Access -  -Can place PICC line after port out for >48 hours (will place order for 12/6)  Discharge Planning / Coordination of Care -  -Outpatient antibiotics set -Discussed with David Joseph, ID pharmacy and primary   Medication Monitoring -  -Safety labs ordered and detailed below to be followed in OPAT clinic   ID will sign off - please call back with any questions/concerns or if we can be of further assistance.    PLAN: Stop metronidazole  PICC ordered for 12/6 OPAT as outlined below     OPAT ORDERS:  Diagnosis: Bacteremia, infected port and Presumptive endocarditis   Culture Result: MSSA  Allergies  Allergen Reactions   Iodinated  Contrast Media Rash    Can tolerate IV dye with steroid protocol     Discharge antibiotics to be given via PICC line:  Cefazolin 2 gm IV TID   Duration: 6 weeks  End Date: 04/01/23   [name]: Home health RN for IV administration and teaching, line care and labs.    Labs weekly while on IV antibiotics: _x_ CBC with differential __ BMP **TWICE WEEKLY ON VANCOMYCIN  _x_ CMP _x_ CRP _x_ ESR __ Vancomycin trough TWICE WEEKLY __ CK  _x_ Please pull PIC at completion of IV antibiotics __ Please leave PIC in place until doctor has seen patient or been notified  Fax weekly labs to [phone]  Clinic Follow Up Appt: 03/20/23 @ 9:30 am with Dr. Luciana Joseph    Principal Problem:   Cholecystitis Active Problems:   OA (osteoarthritis) of knee   Rheumatoid arthritis (HCC)   Chronic systolic congestive heart failure (HCC)   Anemia   S/P unilateral BKA (below knee amputation), right (HCC)   Severe sepsis (HCC)   MSSA bacteremia   Bilirubinemia   Elevated liver enzymes    ascorbic acid  500 mg Oral Daily   Chlorhexidine Gluconate Cloth  6 each Topical Daily   cholecalciferol  1,000 Units Oral Daily   hydroxychloroquine  200 mg Oral Daily   insulin aspart  0-15 Units Subcutaneous  TID WC   insulin aspart  0-5 Units Subcutaneous QHS   metoprolol succinate  12.5 mg Oral BID   pantoprazole  40 mg Oral Daily   predniSONE  15 mg Oral Daily   pregabalin  100 mg Oral TID   valACYclovir  500 mg Oral Daily    SUBJECTIVE: Doing OK. No new concerns outside of thrombocytopenia and ongoing down trend of plt levels. His wife joins [place] at the bedside.    Review of Systems: Review of Systems  Constitutional:  Negative for chills and fever.  Respiratory: Negative.    Cardiovascular: Negative.   Musculoskeletal:  Positive for falls. Negative for joint pain.  Skin:        Scattered abrasions   Neurological: Negative.   Psychiatric/Behavioral: Negative.        Allergies  Allergen Reactions   Iodinated Contrast Media Rash    Can tolerate IV dye with steroid protocol    OBJECTIVE: Vitals:   02/20/23 0324 02/20/23 0456 02/20/23 0812 02/20/23 0909  BP: (!) 104/58  (!) 101/55 (!) 101/55  Pulse: 63  61   Resp: 18  19   Temp: 97.7 F (36.5 C)     TempSrc: Oral     SpO2: 100%  100%   Weight:  77.1 kg    Height:       Body mass index is 25.84 kg/m.  Physical Exam Constitutional:      Appearance: Normal appearance. He is not ill-appearing.  HENT:     Mouth/Throat:     Mouth: Mucous membranes are moist.     Pharynx: Oropharynx is clear.  Eyes:     General: Scleral icterus present.  Cardiovascular:     Rate and Rhythm: Normal rate and regular rhythm.  Pulmonary:     Effort: Pulmonary effort is normal.     Breath sounds: Normal breath sounds.  Abdominal:     General: Bowel sounds are normal.     Palpations: Abdomen is soft.  Musculoskeletal:       Legs:     Comments: Healed callous on right BKA incision - no drainage or erythema or tenderness Left anterior shin with some scabbed full thickness wound from previous fall. No TTP, no drainage. Shallow skin loss over lower anterior shin w/o scabbing.   Skin:    General: Skin is warm and dry.  Neurological:     Mental Status: He is alert and oriented to person, place, and time.     Lab Results Lab Results  Component Value Date   WBC 12.7 (H) 02/20/2023   HGB 9.8 (L) 02/20/2023   HCT 29.6 (L) 02/20/2023   MCV 109.2 (H) 02/20/2023   PLT 22 (LL) 02/20/2023    Lab Results  Component Value Date   CREATININE 0.92 02/20/2023   BUN 30 (H) 02/20/2023   NA 132 (L) 02/20/2023   K 4.9 02/20/2023   CL 103 02/20/2023   CO2 19 (L) 02/20/2023    Lab Results  Component Value Date   ALT 50 (H) 02/19/2023   AST 64 (H) 02/19/2023   ALKPHOS 50 02/19/2023   BILITOT 6.2 (H) 02/19/2023     Microbiology: Recent Results (from the past 240 hour(s))  Blood Culture (routine x 2)      Status: Abnormal   Collection Time: 02/15/23  7:02 AM   Specimen: BLOOD LEFT FOREARM  Result Value Ref Range Status   Specimen Description   Final    BLOOD LEFT FOREARM BOTTLES DRAWN AEROBIC  AND ANAEROBIC Performed at [facility], [address], [place], [place] [address]    Special Requests   Final    Blood Culture results may not be optimal due to an inadequate volume of blood received in culture bottles Performed at [facility], [address], [place], [place] [address]    Culture  Setup Time   Final    GRAM POSITIVE COCCI IN BOTH AEROBIC AND ANAEROBIC BOTTLES Gram Stain Report Called to,Read Back By and Verified With: S RICH AT 0752 ON [id number] BY S DALTON CRITICAL VALUE NOTED.  VALUE IS CONSISTENT WITH PREVIOUSLY REPORTED AND CALLED VALUE. CRITICAL RESULT CALLED TO, READ BACK BY AND VERIFIED WITH: Lihue MEDINA @ [facility] 7829 [phone], Darrin Nipper Performed at [facility], [address], [place], [place] [address]    Culture (A)  Final    STAPHYLOCOCCUS AUREUS SUSCEPTIBILITIES PERFORMED ON PREVIOUS CULTURE WITHIN THE LAST 5 DAYS. Performed at [facility], [address] [place], [place] [address]    Report Status 02/18/2023 FINAL  Final  Resp panel by RT-PCR (RSV, Flu A&B, Covid) Anterior Nasal Swab     Status: None   Collection Time: 02/15/23  7:17 AM   Specimen: Anterior Nasal Swab  Result Value Ref Range Status   SARS Coronavirus 2 by RT PCR NEGATIVE NEGATIVE Final    Comment: (NOTE) SARS-CoV-2 target nucleic acids are NOT DETECTED.  The SARS-CoV-2 RNA is generally detectable in upper respiratory specimens during the acute phase of infection. The lowest concentration of SARS-CoV-2 viral copies this assay can detect is 138 copies/mL. A negative result does not preclude SARS-Cov-2 infection and should not be used as the sole basis for treatment or other patient management decisions. A negative result may occur with  improper specimen collection/handling, submission of  specimen other than nasopharyngeal swab, presence of viral mutation(s) within the areas targeted by this assay, and inadequate number of viral copies(<138 copies/mL). A negative result must be combined with clinical observations, patient history, and epidemiological information. The expected result is Negative.  Fact Sheet for Patients:  BloggerCourse.com  Fact Sheet for Healthcare Providers:  SeriousBroker.it  This test is no t yet approved or cleared by the [place] FDA and  has been authorized for detection and/or diagnosis of SARS-CoV-2 by FDA under an Emergency Use Authorization (EUA). This EUA will remain  in effect (meaning this test can be used) for the duration of the COVID-19 declaration under Section 564(b)(1) of the Act, 21 U.S.C.section 360bbb-3(b)(1), unless the authorization is terminated  or revoked sooner.       Influenza A by PCR NEGATIVE NEGATIVE Final   Influenza B by PCR NEGATIVE NEGATIVE Final    Comment: (NOTE) The Xpert Xpress SARS-CoV-2/FLU/RSV plus assay is intended as an aid in the diagnosis of influenza from Nasopharyngeal swab specimens and should not be used as a sole basis for treatment. Nasal washings and aspirates are unacceptable for Xpert Xpress SARS-CoV-2/FLU/RSV testing.  Fact Sheet for Patients: BloggerCourse.com  Fact Sheet for Healthcare Providers: SeriousBroker.it  This test is not yet approved or cleared by the [place] FDA and has been authorized for detection and/or diagnosis of SARS-CoV-2 by FDA under an Emergency Use Authorization (EUA). This EUA will remain in effect (meaning this test can be used) for the duration of the COVID-19 declaration under Section 564(b)(1) of the Act, 21 U.S.C. section 360bbb-3(b)(1), unless the authorization is terminated or revoked.     Resp Syncytial Virus by PCR NEGATIVE NEGATIVE Final  Comment: (NOTE) Fact Sheet for Patients: BloggerCourse.com  Fact Sheet for Healthcare Providers: SeriousBroker.it  This test is not yet approved or cleared by the [place] FDA and has been authorized for detection and/or diagnosis of SARS-CoV-2 by FDA under an Emergency Use Authorization (EUA). This EUA will remain in effect (meaning this test can be used) for the duration of the COVID-19 declaration under Section 564(b)(1) of the Act, 21 U.S.C. section 360bbb-3(b)(1), unless the authorization is terminated or revoked.  Performed at [facility], [address], [place], [place] [address]   Blood Culture (routine x 2)     Status: Abnormal   Collection Time: 02/15/23  7:28 AM   Specimen: BLOOD RIGHT ARM  Result Value Ref Range Status   Specimen Description   Final    BLOOD RIGHT ARM BOTTLES DRAWN AEROBIC AND ANAEROBIC Performed at [facility], [address] [place], [place] [address]    Special Requests   Final    Blood Culture adequate volume Performed at [facility], [address], [place], [place] [address]    Culture  Setup Time   Final    GRAM POSITIVE COCCI IN BOTH AEROBIC AND ANAEROBIC BOTTLES Gram Stain Report Called to,Read Back By and Verified With: S RICH AT 0752 ON [id number] BY S DALTON CRITICAL RESULT CALLED TO, READ BACK BY AND VERIFIED WITH: PHARMD JESSICA MILLEN [id number] AT 1414 BY EC Performed at [facility], [address], [place], [place] [address]    Culture STAPHYLOCOCCUS AUREUS (A)  Final   Report Status 02/18/2023 FINAL  Final   Organism ID, Bacteria STAPHYLOCOCCUS AUREUS  Final      Susceptibility   Staphylococcus aureus - MIC*    CIPROFLOXACIN <=0.5 SENSITIVE Sensitive     ERYTHROMYCIN <=0.25 SENSITIVE Sensitive     GENTAMICIN <=0.5 SENSITIVE Sensitive     OXACILLIN 0.5 SENSITIVE Sensitive     TETRACYCLINE <=1 SENSITIVE Sensitive     VANCOMYCIN <=0.5 SENSITIVE Sensitive     TRIMETH/SULFA  <=10 SENSITIVE Sensitive     CLINDAMYCIN <=0.25 SENSITIVE Sensitive     RIFAMPIN <=0.5 SENSITIVE Sensitive     Inducible Clindamycin NEGATIVE Sensitive     LINEZOLID 2 SENSITIVE Sensitive     * STAPHYLOCOCCUS AUREUS  Blood Culture ID Panel (Reflexed)     Status: Abnormal   Collection Time: 02/15/23  7:28 AM  Result Value Ref Range Status   Enterococcus faecalis NOT DETECTED NOT DETECTED Final   Enterococcus Faecium NOT DETECTED NOT DETECTED Final   Listeria monocytogenes NOT DETECTED NOT DETECTED Final   Staphylococcus species DETECTED (A) NOT DETECTED Final    Comment: CRITICAL RESULT CALLED TO, READ BACK BY AND VERIFIED WITH: PHARMD JESSICA MILLEN [id number] AT 1414 BY EC    Staphylococcus aureus (BCID) DETECTED (A) NOT DETECTED Final    Comment: CRITICAL RESULT CALLED TO, READ BACK BY AND VERIFIED WITH: PAHRAM JESSIC MILLEN [id number] AT 1414 BY EC    Staphylococcus epidermidis NOT DETECTED NOT DETECTED Final   Staphylococcus lugdunensis NOT DETECTED NOT DETECTED Final   Streptococcus species NOT DETECTED NOT DETECTED Final   Streptococcus agalactiae NOT DETECTED NOT DETECTED Final   Streptococcus pneumoniae NOT DETECTED NOT DETECTED Final   Streptococcus pyogenes NOT DETECTED NOT DETECTED Final   A.calcoaceticus-baumannii NOT DETECTED NOT DETECTED Final   Bacteroides fragilis NOT DETECTED NOT DETECTED Final   Enterobacterales NOT DETECTED NOT DETECTED Final   Enterobacter cloacae complex NOT DETECTED NOT DETECTED Final   Escherichia coli NOT DETECTED NOT DETECTED  Final   Klebsiella aerogenes NOT DETECTED NOT DETECTED Final   Klebsiella oxytoca NOT DETECTED NOT DETECTED Final   Klebsiella pneumoniae NOT DETECTED NOT DETECTED Final   Proteus species NOT DETECTED NOT DETECTED Final   Salmonella species NOT DETECTED NOT DETECTED Final   Serratia marcescens NOT DETECTED NOT DETECTED Final   Haemophilus influenzae NOT DETECTED NOT DETECTED Final   Neisseria meningitidis NOT DETECTED  NOT DETECTED Final   Pseudomonas aeruginosa NOT DETECTED NOT DETECTED Final   Stenotrophomonas maltophilia NOT DETECTED NOT DETECTED Final   Candida albicans NOT DETECTED NOT DETECTED Final   Candida auris NOT DETECTED NOT DETECTED Final   Candida glabrata NOT DETECTED NOT DETECTED Final   Candida krusei NOT DETECTED NOT DETECTED Final   Candida parapsilosis NOT DETECTED NOT DETECTED Final   Candida tropicalis NOT DETECTED NOT DETECTED Final   Cryptococcus neoformans/gattii NOT DETECTED NOT DETECTED Final   Meth resistant mecA/C and MREJ NOT DETECTED NOT DETECTED Final    Comment: Performed at [facility], [address], [place], [place] [address]  MRSA Next Gen by PCR, Nasal     Status: None   Collection Time: 02/15/23 11:03 AM   Specimen: Nasal Mucosa; Nasal Swab  Result Value Ref Range Status   MRSA by PCR Next Gen NOT DETECTED NOT DETECTED Final    Comment: (NOTE) The GeneXpert MRSA Assay (FDA approved for NASAL specimens only), is one component of a comprehensive MRSA colonization surveillance program. It is not intended to diagnose MRSA infection nor to guide or monitor treatment for MRSA infections. Test performance is not FDA approved in patients less than 78 years old. Performed at [facility], [address], [place], [place] [address]   Culture, blood (Routine X 2) w Reflex to ID Panel     Status: None (Preliminary result)   Collection Time: 02/17/23  5:42 AM   Specimen: BLOOD  Result Value Ref Range Status   Specimen Description BLOOD LEFT ANTECUBITAL  Final   Special Requests   Final    BOTTLES DRAWN AEROBIC AND ANAEROBIC Blood Culture results may not be optimal due to an inadequate volume of blood received in culture bottles   Culture   Final    NO GROWTH 3 DAYS Performed at [facility], [address], [place], [place] [address]    Report Status PENDING  Incomplete  Culture, blood (Routine X 2) w Reflex to ID Panel     Status: None (Preliminary  result)   Collection Time: 02/17/23  5:43 AM   Specimen: BLOOD LEFT FOREARM  Result Value Ref Range Status   Specimen Description BLOOD LEFT FOREARM  Final   Special Requests   Final    BOTTLES DRAWN AEROBIC AND ANAEROBIC Blood Culture results may not be optimal due to an inadequate volume of blood received in culture bottles   Culture   Final    NO GROWTH 3 DAYS Performed at [facility], [address] [place], [place] [address]    Report Status PENDING  Incomplete  Culture, blood (Routine X 2) w Reflex to ID Panel     Status: None (Preliminary result)   Collection Time: 02/19/23 12:11 PM   Specimen: BLOOD  Result Value Ref Range Status   Specimen Description BLOOD SITE NOT SPECIFIED  Final   Special Requests   Final    BOTTLES DRAWN AEROBIC AND ANAEROBIC Blood Culture results may not be optimal due to an inadequate volume of blood  received in culture bottles   Culture   Final    NO GROWTH < 24 HOURS Performed at [facility], [address], [place], [place] [address]    Report Status PENDING  Incomplete  Culture, blood (Routine X 2) w Reflex to ID Panel     Status: None (Preliminary result)   Collection Time: 02/19/23 12:30 PM   Specimen: BLOOD LEFT HAND  Result Value Ref Range Status   Specimen Description BLOOD LEFT HAND  Final   Special Requests   Final    BOTTLES DRAWN AEROBIC AND ANAEROBIC Blood Culture results may not be optimal due to an inadequate volume of blood received in culture bottles   Culture   Final    NO GROWTH < 24 HOURS Performed at [facility], [address], [place], [place] [address]    Report Status PENDING  Incomplete    Rexene Alberts, MSN, NP-C [facility]  [email or web address] Pager: [phone] Office: [phone] [facility] Main Line: [phone] *Secure Chat Communication Welcome

## 2023-02-21 DIAGNOSIS — K819 Cholecystitis, unspecified: Secondary | ICD-10-CM | POA: Diagnosis not present

## 2023-02-21 LAB — GLUCOSE, CAPILLARY
Glucose-Capillary: 193 mg/dL — ABNORMAL HIGH (ref 70–99)
Glucose-Capillary: 243 mg/dL — ABNORMAL HIGH (ref 70–99)
Glucose-Capillary: 272 mg/dL — ABNORMAL HIGH (ref 70–99)
Glucose-Capillary: 274 mg/dL — ABNORMAL HIGH (ref 70–99)

## 2023-02-21 LAB — CBC
HCT: 27.6 % — ABNORMAL LOW (ref 39.0–52.0)
Hemoglobin: 9.1 g/dL — ABNORMAL LOW (ref 13.0–17.0)
MCH: 36.1 pg — ABNORMAL HIGH (ref 26.0–34.0)
MCHC: 33 g/dL (ref 30.0–36.0)
MCV: 109.5 fL — ABNORMAL HIGH (ref 80.0–100.0)
Platelets: 56 10*3/uL — ABNORMAL LOW (ref 150–400)
RBC: 2.52 MIL/uL — ABNORMAL LOW (ref 4.22–5.81)
RDW: 26 % — ABNORMAL HIGH (ref 11.5–15.5)
WBC: 10.3 10*3/uL (ref 4.0–10.5)
nRBC: 1.6 % — ABNORMAL HIGH (ref 0.0–0.2)

## 2023-02-21 MED ORDER — MELATONIN 5 MG PO TABS
5.0000 mg | ORAL_TABLET | Freq: Every evening | ORAL | Status: DC | PRN
  Administered 2023-02-21: 5 mg via ORAL
  Filled 2023-02-21: qty 1

## 2023-02-21 MED ORDER — SODIUM CHLORIDE 0.9% FLUSH: 10.0000 mL | INTRAVENOUS | Status: DC | PRN

## 2023-02-21 NOTE — Progress Notes (Signed)
Per CCMD pt had 6 beats of VTach while working with PT. Notified Dr. Mauro Kaufmann.

## 2023-02-21 NOTE — Progress Notes (Signed)
Physical Therapy Treatment Patient Details Name: David Joseph MRN: [id number] DOB: [birth year] Today's Date: 02/21/2023   History of Present Illness 82 yo male presents to [facility] on 11/20 with generalized weakness and lower right sided abdominal pain x6 months. CT of abdomen showed possible acute diverticulitis and gallbladder with sludge and some gallbladder wall thickening. Pt also with L residual limb infection, + MSSA Bacteremia. Staph aureus, possibly related to stump wound or to [facility] -A-Cath which was removed 12/3. Had planned for TEE 12/4 but cancelled due to platelet count <50,000. 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    PT Comments  Pt in bed upon arrival and agreeable to PT session. Worked on bed mobility, LE ROM/strength, pre-transfer training, and postural control in today's session. Pt was fatigued in today's session and required a supine rest break half way through the session. While seated at EOB, pt would occasionally lean to the left requiring cues for upright posture. Did not attempt slide board transfer at this time due to high levels of pain in B hands while performing chair push. Pt was unable to clear bottom with chair push exercise at this time. Pt is progressing towards goals. Acute PT to follow.      If plan is discharge home, recommend the following: A lot of help with bathing/dressing/bathroom;Two people to help with walking and/or transfers;Assistance with cooking/housework;Help with stairs or ramp for entrance;Assist for transportation   Can travel by private vehicle     No  Equipment Recommendations  None recommended by PT       Precautions / Restrictions Precautions Precautions: Fall;Other (comment) Precaution Comments: L elbow bruising/discomfort, mild scrotal edema and skin breakdown around residual limbs Restrictions Weight Bearing Restrictions: No      Mobility  Bed Mobility Overal bed mobility: Needs Assistance Bed Mobility: Rolling, Sidelying to Sit, Sit to Sidelying Rolling: +2 for safety/equipment, Mod assist, Used rails Sidelying to sit: +2 for physical assistance, HOB elevated, Used rails, Mod assist Supine to sit: Max assist, +2 for physical assistance   Sit to sidelying: Mod assist, +2 for physical assistance (x2) General bed mobility comments: pt intially w/ discomfort in low back needing helicopter method to get to EOB with MaxAx2. Pt returned to supine for rest break after exercises. On second attempt, pt was able to roll and push to seated position with ModAx2 for trunk elevation and completion of LE's off EOB. Pt prefers to roll to the left to sit EOB    Transfers  General transfer comment: pt was unable to lift hips off EOB, deferred transfer as pt was having pain in UE and wanted to return to supine          Balance Overall balance assessment: Needs assistance, History of Falls Sitting-balance support: Bilateral upper extremity supported, Feet unsupported Sitting balance-Leahy Scale: Fair Sitting balance - Comments: needs CGA to MinA while performing dynamic activities Postural control: Left lateral lean   Cognition Arousal: Alert Behavior During Therapy: Anxious, WFL for tasks assessed/performed Overall Cognitive Status: History of cognitive impairments - at baseline      Exercises General Exercises - Lower Extremity Long Arc Quad: AROM, Both, 10 reps, Seated Amputee Exercises Hip ABduction/ADduction: AROM, 5 reps, 10 reps, Seated Hip Flexion/Marching: AROM, Both, 10 reps, Seated Chair Push Up: Seated, 10 reps (pt unable to lift hips off EOB) Other Exercises Other Exercises: anterior/posterior trunk raise x5 w/ UE hold to pull  Other Exercises: lateral lean onto forearm w/ push on elbow, Bx5 seated    General Comments General comments (skin integrity, edema, etc.): skin breakdown around both limbs with  redness on posterior RLE. L elbow redness/edema. Pt has itchiness and discomfort on low back w/ RN in room applying barrier cream once seated at EOB.      Pertinent Vitals/Pain Pain Assessment Pain Assessment: Faces Faces Pain Scale: Hurts even more Pain Location: B hands, back, scrotal region Pain Descriptors / Indicators: Guarding, Grimacing, Sore, Tender Pain Intervention(s): Limited activity within patient's tolerance, Monitored during session, Premedicated before session, Repositioned     PT Goals (current goals can now be found in the care plan section) Acute Rehab PT Goals Patient Stated Goal: To feel better and move around on my own PT Goal Formulation: With patient Time For Goal Achievement: 03/03/23 Potential to Achieve Goals: Good Progress towards PT goals: Progressing toward goals    Frequency    Min 1X/week       AM-PAC PT "6 Clicks" Mobility   Outcome Measure  Help needed turning from your back to your side while in a flat bed without using bedrails?: A Lot Help needed moving from lying on your back to sitting on the side of a flat bed without using bedrails?: A Lot Help needed moving to and from a bed to a chair (including a wheelchair)?: Total Help needed standing up from a chair using your arms (e.g., wheelchair or bedside chair)?: Total Help needed to walk in hospital room?: Total Help needed climbing 3-5 steps with a railing? : Total 6 Click Score: 8    End of Session   Activity Tolerance: Patient limited by fatigue Patient left: in bed;with call bell/phone within reach;with bed alarm set Nurse Communication: Mobility status PT Visit Diagnosis: Other abnormalities of gait and mobility (R26.89)     Time: 1610-9604 PT Time Calculation (min) (ACUTE ONLY): 32 min  Charges:    $Therapeutic Exercise: 8-22 mins $Therapeutic Activity: 8-22 mins PT General Charges $$ ACUTE PT VISIT: 1 Visit           Hilton Cork, PT, DPT Secure Chat Preferred  Rehab  Office [phone]    Arturo Morton Brion Aliment 02/21/2023, 4:10 PM

## 2023-02-21 NOTE — Progress Notes (Addendum)
[facility]  PROGRESS NOTE  NAZIAH URDA BMW:[id number] DOB: 08-31-1940 DOA: 02/15/2023 PCP: Donetta Potts, MD   Brief HPI:   82 y.o. 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  On presentation, he was febrile to 101.4 with initial lactic acid of 8 which improved to 3 after IV fluids.  AST of 116 and ALT of 182 with total bilirubin of 12.8.  Influenza/COVID-19/RSV PCR negative.  CT of abdomen showed possible acute diverticulitis and gallbladder with sludge and some gallbladder wall thickening.  Ultrasound abdomen equivocal for acute cholecystitis and negative Murphy sign.  He was started on IV antibiotics.  General surgery at [facility] recommended HIDA scan and if positive, consider cholecystostomy drain.  He was transferred to [facility] for the same.  GI and general surgery were consulted at [facility].  ID was auto consulted for MSSA bacteremia.  Heme-onc was consulted on 02/17/2023.     Assessment/Plan:   Abdominal pain and hyperbilirubinemia and elevated LFTs -Imaging and labs as above.   -General surgery at [facility] recommended HIDA scan and if positive, consider cholecystostomy drain.  He was transferred to [facility] for the same.   -Continue antibiotics as per ID recommendations. -Blood cultures as below. -General Surgery following: General surgery not convinced that patient has acute cholecystitis.  HIDA scan canceled by general surgery.  GI following as well.  MRCP showed cholelithiasis without evidence of acute cholecystitis -LFTs improving as well including total bilirubin.   Possible acute diverticulitis -As  seen on imaging.  GI is not convinced that patient has diverticulitis.  Continue current antibiotics.   MSSA bacteremia -Antibiotics have been switched to IV Ancef and Flagyl by ID.  Possible source from left stump wound.   Also had Port-A-Cath which was removed by general surgery   Follow repeat blood cultures from 02/17/2023.   Cardiology cancelled TEE due to thrombocytopenia, platelet count should be greater than 50,000 before procedure  -Discussed with ID, patient will be on 6 weeks of IV antibiotics.  After discussion with patient and his wife, ID has decided not to pursue with TEE.   Lactic acidosis -Possibly from above.  Improving   Thrombocytopenia -Patient has chronic thrombocytopenia but has worsened since admission.   Platelets 22000 on 02/20/2023 -Significantly improved this morning with platelet count 53,000. -Will give 1 unit of platelet transfusion for platelet count less than 20,000   Acute metabolic acidosis -Mild.  Monitor.   Diabetes mellitus type 2 with hyperglycemia -Continue with with SSI.   -CBG well-controlled   Paroxysmal A-fib -Rate currently controlled.  Eliquis on hold.   Heparin drip held because of anemia and thrombocytopenia. -Hold metoprolol for SBP less than 110   Myelodysplastic syndrome -Patient had Reblozyl injection on 02/11/2023.   Follows with oncology at [facility].   Anemia of chronic disease -From chronic illness.   Hemoglobin dropped to 6.2 on 02/16/2023.  Status post 2 unit packed red cell transfusion.   -Hemoglobin is 9.1 today   Chronic systolic heart failure -Currently compensated.  Strict input and output and daily weights.  Continue metoprolol succinate.  Outpatient follow-up with cardiology   CPPD/RA -On Plaquenil and prednisone.   Outpatient follow-up with rheumatology  Ramsay Hunt syndrome -Continue Valtrex daily   Bilateral BKA with history of PAD  Nonsustained V. Tach -RN called that patient had 6 beat NSVT -Potassium  level was within normal limits, will check serum magnesium level -Continue monitoring on telemetry  Medications     ascorbic acid  500 mg Oral Daily   Chlorhexidine Gluconate Cloth  6 each Topical Daily   cholecalciferol  1,000 Units Oral Daily   hydroxychloroquine  200 mg Oral Daily   insulin aspart  0-15 Units Subcutaneous TID WC   insulin aspart  0-5 Units Subcutaneous QHS   metoprolol succinate  12.5 mg Oral BID   pantoprazole  40 mg Oral Daily   predniSONE  15 mg Oral Daily   pregabalin  100 mg Oral TID   valACYclovir  500 mg Oral Daily     Data Reviewed:   CBG:  Recent Labs  Lab 02/20/23 0810 02/20/23 1127 02/20/23 1607 02/20/23 2104 02/21/23 0736  GLUCAP 247* 170* 187* 216* 193*    SpO2: 100 % O2 Flow Rate (L/min): 6 L/min FiO2 (%): (!) 0 %    Vitals:   02/21/23 0329 02/21/23 0330 02/21/23 0734 02/21/23 0948  BP: (!) 98/50  (!) 97/51 (!) 97/51  Pulse: 65  63 63  Resp: 19  17   Temp: 98 F (36.7 C)  (!) 97.5 F (36.4 C)   TempSrc: Oral  Oral   SpO2: 100%  100%   Weight:  76.8 kg    Height:          Data Reviewed:  Basic Metabolic Panel: Recent Labs  Lab 02/15/23 0728 02/16/23 0755 02/17/23 0159 02/18/23 0304 02/19/23 0625 02/20/23 0534  NA 138 138 134* 132* 133* 132*  K 4.8 3.9 4.3 4.3 4.0 4.9  CL 104 108 105 104 107 103  CO2 18* 21* 21* 21* 21* 19*  GLUCOSE 185* 142* 188* 186* 103* 135*  BUN 23 20 27* 26* 24* 30*  CREATININE 1.00 1.16 1.00 1.15 1.07 0.92  CALCIUM 8.6* 7.8* 7.7* 7.7* 7.6* 7.6*  MG  --  2.0 2.0 2.0 1.9  --   PHOS 4.7*  --   --   --   --   --     CBC: Recent Labs  Lab 02/15/23 0728 02/16/23 0755 02/17/23 0159 02/18/23 0304 02/19/23 0625 02/20/23 0534 02/21/23 0604  WBC 8.5   < > 6.9 9.8 11.8* 12.7* 10.3  NEUTROABS 7.6  --   --   --   --   --   --   HGB 7.4*   < > 7.5* 7.0* 9.1* 9.8* 9.1*  HCT 24.6*   < > 23.1* 22.5* 28.0* 29.6* 27.6*  MCV 137.4*   < > 117.9* 119.7* 109.8* 109.2* 109.5*  PLT 103*   < > 38*  36*  35* 34* 22* 56*   < > = values in this interval not displayed.    LFT Recent Labs  Lab 02/15/23 0728 02/16/23 0755 02/17/23 0159 02/18/23 0304 02/19/23 0625  AST 116* 68* 72* 70* 64*  ALT 182* 143* 150* 114* 50*  ALKPHOS 60 48 50 47 50  BILITOT 12.8* 10.0* 10.2* 6.3* 6.2*  PROT 6.1* 4.9* 5.1* 5.0* 4.8*  ALBUMIN 3.2* 2.4* 2.3* 2.3* 2.2*     Antibiotics: Anti-infectives (From admission, onward)    Start     Dose/Rate Route Frequency Ordered Stop   02/17/23 0200  ceFAZolin (ANCEF) IVPB 1 g/50 mL premix  Status:  Discontinued  1 g 100 mL/hr over 30 Minutes Intravenous Every 8 hours 02/17/23 0059 02/17/23 0059   02/17/23 0200  ceFAZolin (ANCEF) IVPB 2g/100 mL premix        2 g 200 mL/hr over 30 Minutes Intravenous Every 8 hours 02/17/23 0059     02/16/23 0800  vancomycin (VANCOCIN) IVPB 1000 mg/200 mL premix  Status:  Discontinued        1,000 mg 200 mL/hr over 60 Minutes Intravenous Every 24 hours 02/15/23 0855 02/17/23 0059   02/15/23 2000  ceFEPIme (MAXIPIME) 2 g in sodium chloride 0.9 % 100 mL IVPB  Status:  Discontinued        2 g 200 mL/hr over 30 Minutes Intravenous Every 12 hours 02/15/23 0855 02/17/23 0059   02/15/23 2000  metroNIDAZOLE (FLAGYL) IVPB 500 mg  Status:  Discontinued        500 mg 100 mL/hr over 60 Minutes Intravenous Every 12 hours 02/15/23 1204 02/20/23 1134   02/15/23 1215  hydroxychloroquine (PLAQUENIL) tablet 200 mg        200 mg Oral Daily 02/15/23 1204     02/15/23 1215  valACYclovir (VALTREX) tablet 500 mg        500 mg Oral Daily 02/15/23 1204     02/15/23 0700  ceFEPIme (MAXIPIME) 2 g in sodium chloride 0.9 % 100 mL IVPB        2 g 200 mL/hr over 30 Minutes Intravenous  Once 02/15/23 0658 02/15/23 0856   02/15/23 0700  metroNIDAZOLE (FLAGYL) IVPB 500 mg        500 mg 100 mL/hr over 60 Minutes Intravenous  Once 02/15/23 0658 02/15/23 0856   02/15/23 0700  vancomycin (VANCOCIN) IVPB 1000 mg/200 mL premix        1,000 mg 200 mL/hr  over 60 Minutes Intravenous  Once 02/15/23 0658 02/15/23 0856        DVT prophylaxis: Heparin held due to thrombocytopenia  Code Status: Full code  Family Communication: No family at bedside   CONSULTS ID, general surgery, gastroenterology, oncology   Subjective   Awaiting for PICC line placement today.  Platelet counts are up to 50,000   Objective    Physical Examination:   General-appears in no acute distress Heart-S1-S2, regular, no murmur auscultated Lungs-clear to auscultation bilaterally, no wheezing or crackles auscultated Abdomen-soft, nontender, no organomegaly Extremities-bilateral BKA Neuro-alert, oriented x3, no focal deficit noted   Status is: Inpatient:      Pressure Injury 02/18/23 Coccyx Medial Stage 1 -  Intact skin with non-blanchable redness of a localized area usually over a bony prominence. (Active)  02/18/23 1854  Location: Coccyx  Location Orientation: Medial  Staging: Stage 1 -  Intact skin with non-blanchable redness of a localized area usually over a bony prominence.  Wound Description (Comments):   Present on Admission:         Kienan Doublin S Kendrix Orman   [facility] If 7PM-7AM, please contact night-coverage at [email or web address], Office  [phone]   02/21/2023, 10:03 AM  LOS: 6 days

## 2023-02-21 NOTE — Plan of Care (Signed)
  Problem: Education: Goal: Knowledge of General Education information will improve Description Including pain rating scale, medication(s)/side effects and non-pharmacologic comfort measures Outcome: Progressing   

## 2023-02-21 NOTE — Progress Notes (Signed)
Brief Hematology note  I received a message from social worker regarding his Reblozyl injections.  He received his last dose on February 11, 2023, and will be due for next dose on March 04, 2023.  If patient will be discharged to a rehab, he can schedule follow-up appointment with his hematologist at the [facility] on March 04, 2023 for follow-up and Reblozyl injections in office.  If transportation cannot be arranged by the rehab, postpone the injection for a few weeks would be okay.  Please repeat his CBC in 1-2 weeks after discharge.  Malachy Mood MD 02/21/2023

## 2023-02-21 NOTE — Progress Notes (Signed)
Peripherally Inserted Central Catheter Placement  The IV Nurse has discussed with the patient and/or persons authorized to consent for the patient, the purpose of this procedure and the potential benefits and risks involved with this procedure.  The benefits include less needle sticks, lab draws from the catheter, and the patient may be discharged home with the catheter. Risks include, but not limited to, infection, bleeding, blood clot (thrombus formation), and puncture of an artery; nerve damage and irregular heartbeat and possibility to perform a PICC exchange if needed/ordered by physician.  Alternatives to this procedure were also discussed.  Bard Power PICC patient education guide, fact sheet on infection prevention and patient information card has been provided to patient /or left at bedside.    PICC Placement Documentation  PICC Single Lumen 02/21/23 Left Basilic 44 cm 0 cm (Active)  Indication for Insertion or Continuance of Line Home intravenous therapies (PICC only) 02/21/23 1057  Exposed Catheter (cm) 0 cm 02/21/23 1057  Site Assessment Clean, Dry, Intact;Other (Comment) 02/21/23 1057  Line Status Flushed;Saline locked;Blood return noted 02/21/23 1057  Dressing Type Transparent;Securing device 02/21/23 1057  Dressing Status Antimicrobial disc in place;Clean, Dry, Intact 02/21/23 1057  Line Care Connections checked and tightened 02/21/23 1057  Line Adjustment (NICU/IV Team Only) No 02/21/23 1057  Dressing Intervention New dressing;Adhesive placed at insertion site (IV team only) 02/21/23 1057  Dressing Change Due 02/28/23 02/21/23 1057       Reginia Forts Albarece 02/21/2023, 10:59 AM

## 2023-02-21 NOTE — Plan of Care (Signed)

## 2023-02-21 NOTE — TOC Progression Note (Addendum)
Transition of Care [facility]) - Progression Note    Patient Details  Name: David Joseph MRN: [id number] Date of Birth: 04-Oct-1940  Transition of Care [facility]) CM/SW Contact  Carley Hammed, LCSW Phone Number: 02/21/2023, 12:41 PM  Clinical Narrative:    CSW spoke with several facilities who noted concern over the cost of a medication pt receives through infusion once a month, Roblozyl. CSW spoke with spouse who confirmed this is given by the cancer center and during pt's last rehab stay, they chose to skip the infusion until the following month. Spouse states a preference for [facility] rehab or [facility]. CSW spoke with [facility] rehab who noted they cannot take pt's with any cancer needs as a facility and would have to decline. [facility] has DON reviewing and are currently considering. TOC will continue to follow for DC needs.   2:00 [facility] is willing to offer on pt but they stated they need documentation, in the DC summary is fine, that it is ok for this pt to miss his infusion next month. CSW notified medical team who consulted Oncology to confirm if this is an option. TOC will continue to follow.   3:30 Oncology noting it is ok to defer infusion until after rehab or transport to OP if able, will put in documentation. CSW notified facility and spouse to update. TOC will continue to follow.   Expected Discharge Plan: Home w Home Health Services (await PT eval)    Expected Discharge Plan and Services   Discharge Planning Services: CM Consult Post Acute Care Choice:  (await PT eval) Living arrangements for the past 2 months: Single Family Home                 DME Arranged:  (await PT eval)         HH Arranged:  (see note)           Social Determinants of Health (SDOH) Interventions SDOH Screenings   Food Insecurity: No Food Insecurity (02/15/2023)  Housing: Low Risk  (02/15/2023)  Transportation Needs: No Transportation Needs (02/15/2023)  Utilities: Not At Risk  (02/15/2023)  Alcohol Screen: Low Risk  (10/15/2021)  Depression (PHQ2-9): Low Risk  (10/15/2021)  Financial Resource Strain: Low Risk  (05/20/2022)   [facility] from [facility]  Physical Activity: Inactive (10/24/2022)   Received from [facility]  Social Connections: Socially Integrated (10/15/2021)  Stress: No Stress Concern Present (10/24/2022)   [facility] from [facility]  Tobacco Use: Medium Risk (02/18/2023)  Health Literacy: Medium Risk (07/29/2022)   Received from [facility]    Readmission Risk Interventions     No data to display

## 2023-02-22 DIAGNOSIS — R748 Abnormal levels of other serum enzymes: Secondary | ICD-10-CM | POA: Diagnosis not present

## 2023-02-22 DIAGNOSIS — D649 Anemia, unspecified: Secondary | ICD-10-CM | POA: Diagnosis not present

## 2023-02-22 DIAGNOSIS — K819 Cholecystitis, unspecified: Secondary | ICD-10-CM | POA: Diagnosis not present

## 2023-02-22 LAB — COMPREHENSIVE METABOLIC PANEL
ALT: 11 U/L (ref 0–44)
AST: 32 U/L (ref 15–41)
Albumin: 2 g/dL — ABNORMAL LOW (ref 3.5–5.0)
Alkaline Phosphatase: 53 U/L (ref 38–126)
Anion gap: 10 (ref 5–15)
BUN: 20 mg/dL (ref 8–23)
CO2: 23 mmol/L (ref 22–32)
Calcium: 7.8 mg/dL — ABNORMAL LOW (ref 8.9–10.3)
Chloride: 102 mmol/L (ref 98–111)
Creatinine, Ser: 0.8 mg/dL (ref 0.61–1.24)
GFR, Estimated: >60 mL/min (ref >60)
Glucose, Bld: 261 mg/dL — ABNORMAL HIGH (ref 70–99)
Potassium: 4.4 mmol/L (ref 3.5–5.1)
Sodium: 135 mmol/L (ref 135–145)
Total Bilirubin: 3.2 mg/dL — ABNORMAL HIGH (ref <1.2)
Total Protein: 4.5 g/dL — ABNORMAL LOW (ref 6.5–8.1)

## 2023-02-22 LAB — GLUCOSE, CAPILLARY
Glucose-Capillary: 189 mg/dL — ABNORMAL HIGH (ref 70–99)
Glucose-Capillary: 218 mg/dL — ABNORMAL HIGH (ref 70–99)
Glucose-Capillary: 254 mg/dL — ABNORMAL HIGH (ref 70–99)
Glucose-Capillary: 263 mg/dL — ABNORMAL HIGH (ref 70–99)

## 2023-02-22 LAB — CULTURE, BLOOD (ROUTINE X 2)
Culture: NO GROWTH
Culture: NO GROWTH

## 2023-02-22 LAB — CBC
HCT: 28.7 % — ABNORMAL LOW (ref 39.0–52.0)
Hemoglobin: 9 g/dL — ABNORMAL LOW (ref 13.0–17.0)
MCH: 36.9 pg — ABNORMAL HIGH (ref 26.0–34.0)
MCHC: 31.4 g/dL (ref 30.0–36.0)
MCV: 117.6 fL — ABNORMAL HIGH (ref 80.0–100.0)
Platelets: 68 10*3/uL — ABNORMAL LOW (ref 150–400)
RBC: 2.44 MIL/uL — ABNORMAL LOW (ref 4.22–5.81)
RDW: 25.9 % — ABNORMAL HIGH (ref 11.5–15.5)
WBC: 8.1 10*3/uL (ref 4.0–10.5)
nRBC: 2.1 % — ABNORMAL HIGH (ref 0.0–0.2)

## 2023-02-22 NOTE — Plan of Care (Signed)

## 2023-02-22 NOTE — Progress Notes (Signed)
[facility]  PROGRESS NOTE  David Joseph OZH:[id number] DOB: 1940-11-18 DOA: 02/15/2023 PCP: Donetta Potts, MD   Brief HPI:   82 y.o. 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  On presentation, he was febrile to 101.4 with initial lactic acid of 8 which improved to 3 after IV fluids.  AST of 116 and ALT of 182 with total bilirubin of 12.8.  Influenza/COVID-19/RSV PCR negative.  CT of abdomen showed possible acute diverticulitis and gallbladder with sludge and some gallbladder wall thickening.  Ultrasound abdomen equivocal for acute cholecystitis and negative Murphy sign.  He was started on IV antibiotics.  General surgery at [facility] recommended HIDA scan and if positive, consider cholecystostomy drain.  He was transferred to [facility] for the same.  GI and general surgery were consulted at [facility].  ID was auto consulted for MSSA bacteremia.  Heme-onc was consulted on 02/17/2023.     Assessment/Plan:   Abdominal pain and hyperbilirubinemia and elevated LFTs -Imaging and labs as above.   -General surgery at [facility] recommended HIDA scan and if positive, consider cholecystostomy drain.  He was transferred to [facility] for the same.   -Continue antibiotics as per ID recommendations. -Blood cultures as below. -General Surgery following: General surgery not convinced that patient has acute cholecystitis.  HIDA scan canceled by general surgery.  GI following as well.  MRCP showed cholelithiasis without evidence of acute cholecystitis -LFTs improving as well including total bilirubin.   Possible acute diverticulitis -As  seen on imaging.  GI is not convinced that patient has diverticulitis.  Continue current antibiotics.   MSSA bacteremia -Antibiotics have been switched to IV Ancef and Flagyl by ID.  Possible source from left stump wound.   Also had Port-A-Cath which was removed by general surgery   Follow repeat blood cultures from 02/17/2023.   Cardiology cancelled TEE due to thrombocytopenia, platelet count should be greater than 50,000 before procedure  -Discussed with ID, patient will be on 6 weeks of IV antibiotics.  After discussion with patient and his wife, ID has decided not to pursue with TEE. -PICC line placed yesterday   Lactic acidosis -Possibly from above.  Improving   Thrombocytopenia -Patient has chronic thrombocytopenia but has worsened since admission.   Platelets 22000 on 02/20/2023 -Significantly improved this morning with platelet count 68,000 -Will give 1 unit of platelet transfusion for platelet count less than 20,000   Acute metabolic acidosis -Mild.  Monitor.   Diabetes mellitus type 2 with hyperglycemia -Continue with with SSI.   -CBG well-controlled   Paroxysmal A-fib -Rate currently controlled.  Eliquis on hold.   Heparin drip held because of anemia and thrombocytopenia. -Hold metoprolol for SBP less than 110   Myelodysplastic syndrome -Patient had Reblozyl injection on 02/11/2023.   Follows with oncology at [facility].   Anemia of chronic disease -From chronic illness.   Hemoglobin dropped to 6.2 on 02/16/2023.  Status post 2 unit packed red cell transfusion.   -Hemoglobin is 9.1 today   Chronic systolic heart failure -Currently compensated.  Strict input and output and daily weights.  Continue metoprolol succinate.  Outpatient follow-up with cardiology   CPPD/RA -On Plaquenil and prednisone.   Outpatient follow-up  with rheumatology   Ramsay Hunt syndrome -Continue Valtrex daily   Bilateral BKA with history of PAD  Nonsustained V. Tach -RN called that patient  had 6 beat NSVT -Potassium level was within normal limits, will check serum magnesium level -Continue monitoring on telemetry  Medications     ascorbic acid  500 mg Oral Daily   Chlorhexidine Gluconate Cloth  6 each Topical Daily   cholecalciferol  1,000 Units Oral Daily   hydroxychloroquine  200 mg Oral Daily   insulin aspart  0-15 Units Subcutaneous TID WC   insulin aspart  0-5 Units Subcutaneous QHS   metoprolol succinate  12.5 mg Oral BID   pantoprazole  40 mg Oral Daily   predniSONE  15 mg Oral Daily   pregabalin  100 mg Oral TID   valACYclovir  500 mg Oral Daily     Data Reviewed:   CBG:  Recent Labs  Lab 02/21/23 1216 02/21/23 1558 02/21/23 1958 02/22/23 0749 02/22/23 1110  GLUCAP 243* 272* 274* 189* 218*    SpO2: 97 % O2 Flow Rate (L/min): 6 L/min FiO2 (%): (!) 0 %    Vitals:   02/21/23 1957 02/22/23 0410 02/22/23 0533 02/22/23 0751  BP: (!) 88/62  102/64 93/64  Pulse: 63  65 66  Resp: 18  17 20   Temp: 97.7 F (36.5 C)  97.8 F (36.6 C) 97.6 F (36.4 C)  TempSrc: Oral  Oral Axillary  SpO2: 98%  98% 97%  Weight:  75.5 kg    Height:          Data Reviewed:  Basic Metabolic Panel: Recent Labs  Lab 02/16/23 0755 02/17/23 0159 02/18/23 0304 02/19/23 0625 02/20/23 0534 02/22/23 0114  NA 138 134* 132* 133* 132* 135  K 3.9 4.3 4.3 4.0 4.9 4.4  CL 108 105 104 107 103 102  CO2 21* 21* 21* 21* 19* 23  GLUCOSE 142* 188* 186* 103* 135* 261*  BUN 20 27* 26* 24* 30* 20  CREATININE 1.16 1.00 1.15 1.07 0.92 0.80  CALCIUM 7.8* 7.7* 7.7* 7.6* 7.6* 7.8*  MG 2.0 2.0 2.0 1.9  --   --     CBC: Recent Labs  Lab 02/18/23 0304 02/19/23 0625 02/20/23 0534 02/21/23 0604 02/22/23 0114  WBC 9.8 11.8* 12.7* 10.3 8.1  HGB 7.0* 9.1* 9.8* 9.1* 9.0*  HCT 22.5* 28.0* 29.6* 27.6* 28.7*  MCV 119.7* 109.8* 109.2* 109.5* 117.6*  PLT 36*  35* 34* 22* 56* 68*    LFT Recent Labs  Lab 02/16/23 0755 02/17/23 0159 02/18/23 0304 02/19/23 0625  02/22/23 0114  AST 68* 72* 70* 64* 32  ALT 143* 150* 114* 50* 11  ALKPHOS 48 50 47 50 53  BILITOT 10.0* 10.2* 6.3* 6.2* 3.2*  PROT 4.9* 5.1* 5.0* 4.8* 4.5*  ALBUMIN 2.4* 2.3* 2.3* 2.2* 2.0*     Antibiotics: Anti-infectives (From admission, onward)    Start     Dose/Rate Route Frequency Ordered Stop   02/17/23 0200  ceFAZolin (ANCEF) IVPB 1 g/50 mL premix  Status:  Discontinued        1 g 100 mL/hr over 30 Minutes Intravenous Every 8 hours 02/17/23 0059 02/17/23 0059   02/17/23 0200  ceFAZolin (ANCEF) IVPB 2g/100 mL premix        2 g 200 mL/hr over 30 Minutes Intravenous Every 8 hours 02/17/23 0059     02/16/23 0800  vancomycin (VANCOCIN) IVPB 1000 mg/200 mL premix  Status:  Discontinued  1,000 mg 200 mL/hr over 60 Minutes Intravenous Every 24 hours 02/15/23 0855 02/17/23 0059   02/15/23 2000  ceFEPIme (MAXIPIME) 2 g in sodium chloride 0.9 % 100 mL IVPB  Status:  Discontinued        2 g 200 mL/hr over 30 Minutes Intravenous Every 12 hours 02/15/23 0855 02/17/23 0059   02/15/23 2000  metroNIDAZOLE (FLAGYL) IVPB 500 mg  Status:  Discontinued        500 mg 100 mL/hr over 60 Minutes Intravenous Every 12 hours 02/15/23 1204 02/20/23 1134   02/15/23 1215  hydroxychloroquine (PLAQUENIL) tablet 200 mg        200 mg Oral Daily 02/15/23 1204     02/15/23 1215  valACYclovir (VALTREX) tablet 500 mg        500 mg Oral Daily 02/15/23 1204     02/15/23 0700  ceFEPIme (MAXIPIME) 2 g in sodium chloride 0.9 % 100 mL IVPB        2 g 200 mL/hr over 30 Minutes Intravenous  Once 02/15/23 0658 02/15/23 0856   02/15/23 0700  metroNIDAZOLE (FLAGYL) IVPB 500 mg        500 mg 100 mL/hr over 60 Minutes Intravenous  Once 02/15/23 0658 02/15/23 0856   02/15/23 0700  vancomycin (VANCOCIN) IVPB 1000 mg/200 mL premix        1,000 mg 200 mL/hr over 60 Minutes Intravenous  Once 02/15/23 0658 02/15/23 0856        DVT prophylaxis: Heparin held due to thrombocytopenia  Code Status: Full  code  Family Communication: No family at bedside   CONSULTS ID, general surgery, gastroenterology, oncology   Subjective    Denies any complaints, platelet count is improved to 68,000.  PICC line was placed yesterday.  Objective    Physical Examination:   Appears in no acute distress S1-S2, regular Lungs clear to auscultation bilaterally Abdomen is soft, nontender, no organomegaly Extremities bilateral BKA   Status is: Inpatient:      Pressure Injury 02/18/23 Coccyx Medial Stage 1 -  Intact skin with non-blanchable redness of a localized area usually over a bony prominence. (Active)  02/18/23 1854  Location: Coccyx  Location Orientation: Medial  Staging: Stage 1 -  Intact skin with non-blanchable redness of a localized area usually over a bony prominence.  Wound Description (Comments):   Present on Admission:         Karis Emig S Clatie Kessen   [facility] If 7PM-7AM, please contact night-coverage at [email or web address], Office  [phone]   02/22/2023, 2:10 PM  LOS: 7 days

## 2023-02-23 DIAGNOSIS — R748 Abnormal levels of other serum enzymes: Secondary | ICD-10-CM | POA: Diagnosis not present

## 2023-02-23 DIAGNOSIS — K819 Cholecystitis, unspecified: Secondary | ICD-10-CM | POA: Diagnosis not present

## 2023-02-23 DIAGNOSIS — D649 Anemia, unspecified: Secondary | ICD-10-CM | POA: Diagnosis not present

## 2023-02-23 DIAGNOSIS — I5022 Chronic systolic (congestive) heart failure: Secondary | ICD-10-CM | POA: Diagnosis not present

## 2023-02-23 LAB — CBC
HCT: 32.3 % — ABNORMAL LOW (ref 39.0–52.0)
Hemoglobin: 10.3 g/dL — ABNORMAL LOW (ref 13.0–17.0)
MCH: 36.8 pg — ABNORMAL HIGH (ref 26.0–34.0)
MCHC: 31.9 g/dL (ref 30.0–36.0)
MCV: 115.4 fL — ABNORMAL HIGH (ref 80.0–100.0)
Platelets: 83 10*3/uL — ABNORMAL LOW (ref 150–400)
RBC: 2.8 MIL/uL — ABNORMAL LOW (ref 4.22–5.81)
RDW: 26.3 % — ABNORMAL HIGH (ref 11.5–15.5)
WBC: 7.2 10*3/uL (ref 4.0–10.5)
nRBC: 6 % — ABNORMAL HIGH (ref 0.0–0.2)

## 2023-02-23 LAB — GLUCOSE, CAPILLARY
Glucose-Capillary: 157 mg/dL — ABNORMAL HIGH (ref 70–99)
Glucose-Capillary: 180 mg/dL — ABNORMAL HIGH (ref 70–99)
Glucose-Capillary: 270 mg/dL — ABNORMAL HIGH (ref 70–99)
Glucose-Capillary: 337 mg/dL — ABNORMAL HIGH (ref 70–99)

## 2023-02-23 NOTE — Progress Notes (Signed)
[facility]  PROGRESS NOTE  David Joseph WGN:[id number] DOB: 09-10-1940 DOA: 02/15/2023 PCP: Donetta Potts, MD   Brief HPI:   82 y.o. 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  On presentation, he was febrile to 101.4 with initial lactic acid of 8 which improved to 3 after IV fluids.  AST of 116 and ALT of 182 with total bilirubin of 12.8.  Influenza/COVID-19/RSV PCR negative.  CT of abdomen showed possible acute diverticulitis and gallbladder with sludge and some gallbladder wall thickening.  Ultrasound abdomen equivocal for acute cholecystitis and negative Murphy sign.  He was started on IV antibiotics.  General surgery at [facility] recommended HIDA scan and if positive, consider cholecystostomy drain.  He was transferred to [facility] for the same.  GI and general surgery were consulted at [facility]  ID was auto consulted for MSSA bacteremia.  Heme-onc was consulted on 02/17/2023.     Assessment/Plan:   Abdominal pain and hyperbilirubinemia and elevated LFTs -Imaging and labs as above.   -General surgery at [facility] recommended HIDA scan and if positive, consider cholecystostomy drain.  He was transferred to [facility] for the same.   -Continue antibiotics as per ID recommendations. -Blood cultures as below. -General Surgery following: General surgery not convinced that patient has acute cholecystitis.  HIDA scan canceled by general surgery.  GI following as well.  MRCP showed cholelithiasis without evidence of acute cholecystitis -LFTs improving as well including total bilirubin.  Bilateral thigh edema -Likely in  setting of hypoalbuminemia, albumin level 2.0 -Will consult ideation for nutrition   Possible acute diverticulitis -As seen on imaging.  GI is not convinced that patient has diverticulitis.  Continue current antibiotics.   MSSA bacteremia -Antibiotics have been switched to IV Ancef and Flagyl by ID.  Possible source from left stump wound.   Also had Port-A-Cath which was removed by general surgery   Follow repeat blood cultures from 02/17/2023.   Cardiology cancelled TEE due to thrombocytopenia, platelet count should be greater than 50,000 before procedure  -Discussed with ID, patient will be on 6 weeks of IV antibiotics.  After discussion with patient and his wife, ID has decided not to pursue with TEE. -PICC line placed yesterday   Lactic acidosis -Possibly from above.  Improving   Thrombocytopenia -Patient has chronic thrombocytopenia but has worsened since admission.   Platelets 22000 on 02/20/2023 -Significantly improved this morning with platelet count 83,000 -Will give 1 unit of platelet transfusion for platelet count less than 20,000   Acute metabolic acidosis -Mild.  Monitor.   Diabetes mellitus type 2 with hyperglycemia -Continue with with SSI.   -CBG well-controlled   Paroxysmal A-fib -Rate currently controlled.  Eliquis on hold.   Heparin drip held because of anemia and thrombocytopenia. -Hold metoprolol for SBP less than 110   Myelodysplastic syndrome -Patient had Reblozyl injection on 02/11/2023.   Follows with oncology at [facility].   Anemia of chronic disease -From chronic illness.   Hemoglobin dropped to 6.2 on 02/16/2023.  Status post 2 unit packed red cell transfusion.   -Hemoglobin is 9.1 today   Chronic systolic heart failure -Currently compensated.  Strict input and output and daily weights.  Continue metoprolol  succinate.  Outpatient follow-up with cardiology   CPPD/RA -On Plaquenil and prednisone.   Outpatient follow-up with rheumatology   Ramsay  Hunt syndrome -Continue Valtrex daily   Bilateral BKA with history of PAD  Nonsustained V. Tach -RN called that patient had 6 beat NSVT -Potassium level was within normal limits, will check serum magnesium level -Continue monitoring on telemetry  Medications     ascorbic acid  500 mg Oral Daily   Chlorhexidine Gluconate Cloth  6 each Topical Daily   cholecalciferol  1,000 Units Oral Daily   hydroxychloroquine  200 mg Oral Daily   insulin aspart  0-15 Units Subcutaneous TID WC   insulin aspart  0-5 Units Subcutaneous QHS   metoprolol succinate  12.5 mg Oral BID   pantoprazole  40 mg Oral Daily   predniSONE  15 mg Oral Daily   pregabalin  100 mg Oral TID   valACYclovir  500 mg Oral Daily     Data Reviewed:   CBG:  Recent Labs  Lab 02/22/23 0749 02/22/23 1110 02/22/23 1656 02/22/23 2215 02/23/23 0810  GLUCAP 189* 218* 254* 263* 157*    SpO2: 97 % O2 Flow Rate (L/min): 6 L/min FiO2 (%): (!) 0 %    Vitals:   02/23/23 0500 02/23/23 0529 02/23/23 0808 02/23/23 0850  BP:  106/65 100/63 100/63  Pulse:  69 75 75  Resp:  18 18   Temp:  98.1 F (36.7 C) 97.8 F (36.6 C)   TempSrc:  Oral Oral   SpO2:  96% 97%   Weight: 74.5 kg     Height:          Data Reviewed:  Basic Metabolic Panel: Recent Labs  Lab 02/17/23 0159 02/18/23 0304 02/19/23 0625 02/20/23 0534 02/22/23 0114  NA 134* 132* 133* 132* 135  K 4.3 4.3 4.0 4.9 4.4  CL 105 104 107 103 102  CO2 21* 21* 21* 19* 23  GLUCOSE 188* 186* 103* 135* 261*  BUN 27* 26* 24* 30* 20  CREATININE 1.00 1.15 1.07 0.92 0.80  CALCIUM 7.7* 7.7* 7.6* 7.6* 7.8*  MG 2.0 2.0 1.9  --   --     CBC: Recent Labs  Lab 02/19/23 0625 02/20/23 0534 02/21/23 0604 02/22/23 0114 02/23/23 0533  WBC 11.8* 12.7* 10.3 8.1 7.2  HGB 9.1* 9.8* 9.1* 9.0* 10.3*  HCT 28.0* 29.6* 27.6* 28.7* 32.3*  MCV 109.8* 109.2* 109.5* 117.6* 115.4*  PLT 34* 22* 56* 68* 83*    LFT Recent Labs  Lab 02/17/23 0159 02/18/23 0304  02/19/23 0625 02/22/23 0114  AST 72* 70* 64* 32  ALT 150* 114* 50* 11  ALKPHOS 50 47 50 53  BILITOT 10.2* 6.3* 6.2* 3.2*  PROT 5.1* 5.0* 4.8* 4.5*  ALBUMIN 2.3* 2.3* 2.2* 2.0*     Antibiotics: Anti-infectives (From admission, onward)    Start     Dose/Rate Route Frequency Ordered Stop   02/17/23 0200  ceFAZolin (ANCEF) IVPB 1 g/50 mL premix  Status:  Discontinued        1 g 100 mL/hr over 30 Minutes Intravenous Every 8 hours 02/17/23 0059 02/17/23 0059   02/17/23 0200  ceFAZolin (ANCEF) IVPB 2g/100 mL premix        2 g 200 mL/hr over 30 Minutes Intravenous Every 8 hours 02/17/23 0059     02/16/23 0800  vancomycin (VANCOCIN) IVPB 1000 mg/200 mL premix  Status:  Discontinued        1,000 mg 200 mL/hr over 60 Minutes  Intravenous Every 24 hours 02/15/23 0855 02/17/23 0059   02/15/23 2000  ceFEPIme (MAXIPIME) 2 g in sodium chloride 0.9 % 100 mL IVPB  Status:  Discontinued        2 g 200 mL/hr over 30 Minutes Intravenous Every 12 hours 02/15/23 0855 02/17/23 0059   02/15/23 2000  metroNIDAZOLE (FLAGYL) IVPB 500 mg  Status:  Discontinued        500 mg 100 mL/hr over 60 Minutes Intravenous Every 12 hours 02/15/23 1204 02/20/23 1134   02/15/23 1215  hydroxychloroquine (PLAQUENIL) tablet 200 mg        200 mg Oral Daily 02/15/23 1204     02/15/23 1215  valACYclovir (VALTREX) tablet 500 mg        500 mg Oral Daily 02/15/23 1204     02/15/23 0700  ceFEPIme (MAXIPIME) 2 g in sodium chloride 0.9 % 100 mL IVPB        2 g 200 mL/hr over 30 Minutes Intravenous  Once 02/15/23 0658 02/15/23 0856   02/15/23 0700  metroNIDAZOLE (FLAGYL) IVPB 500 mg        500 mg 100 mL/hr over 60 Minutes Intravenous  Once 02/15/23 0658 02/15/23 0856   02/15/23 0700  vancomycin (VANCOCIN) IVPB 1000 mg/200 mL premix        1,000 mg 200 mL/hr over 60 Minutes Intravenous  Once 02/15/23 0658 02/15/23 0856        DVT prophylaxis: Heparin held due to thrombocytopenia  Code Status: Full code  Family  Communication: No family at bedside   CONSULTS ID, general surgery, gastroenterology, oncology   Subjective   Complains of swelling in the lower extremities   Objective    Physical Examination:   General-appears in no acute distress Heart-S1-S2, regular, no murmur auscultated Lungs-clear to auscultation bilaterally, no wheezing or crackles auscultated Abdomen-soft, nontender, no organomegaly Extremities-trace edema in bilateral thighs, status post BKA Neuro-alert, oriented x3, no focal deficit noted   Status is: Inpatient:      Pressure Injury 02/18/23 Coccyx Medial Stage 1 -  Intact skin with non-blanchable redness of a localized area usually over a bony prominence. (Active)  02/18/23 1854  Location: Coccyx  Location Orientation: Medial  Staging: Stage 1 -  Intact skin with non-blanchable redness of a localized area usually over a bony prominence.  Wound Description (Comments):   Present on Admission:         Honest Safranek S Myer Bohlman   [facility] If 7PM-7AM, please contact night-coverage at [email or web address], Office  [phone]   02/23/2023, 11:33 AM  LOS: 8 days

## 2023-02-23 NOTE — Progress Notes (Signed)
Pt's spouse notified of rash on pt's back that wasn't there previously. I applied hydrocortisone cream and notified MD. Will continue to monitor.

## 2023-02-23 NOTE — Plan of Care (Signed)

## 2023-02-24 DIAGNOSIS — E1142 Type 2 diabetes mellitus with diabetic polyneuropathy: Secondary | ICD-10-CM | POA: Diagnosis not present

## 2023-02-24 DIAGNOSIS — I5020 Unspecified systolic (congestive) heart failure: Secondary | ICD-10-CM | POA: Diagnosis not present

## 2023-02-24 DIAGNOSIS — R7989 Other specified abnormal findings of blood chemistry: Secondary | ICD-10-CM | POA: Diagnosis not present

## 2023-02-24 DIAGNOSIS — C4492 Squamous cell carcinoma of skin, unspecified: Secondary | ICD-10-CM | POA: Diagnosis not present

## 2023-02-24 DIAGNOSIS — I4819 Other persistent atrial fibrillation: Secondary | ICD-10-CM | POA: Diagnosis not present

## 2023-02-24 DIAGNOSIS — K2100 Gastro-esophageal reflux disease with esophagitis, without bleeding: Secondary | ICD-10-CM | POA: Diagnosis not present

## 2023-02-24 DIAGNOSIS — Z8546 Personal history of malignant neoplasm of prostate: Secondary | ICD-10-CM | POA: Diagnosis not present

## 2023-02-24 DIAGNOSIS — Z87891 Personal history of nicotine dependence: Secondary | ICD-10-CM | POA: Diagnosis not present

## 2023-02-24 DIAGNOSIS — L97129 Non-pressure chronic ulcer of left thigh with unspecified severity: Secondary | ICD-10-CM | POA: Diagnosis not present

## 2023-02-24 DIAGNOSIS — R6520 Severe sepsis without septic shock: Secondary | ICD-10-CM | POA: Diagnosis not present

## 2023-02-24 DIAGNOSIS — Z7401 Bed confinement status: Secondary | ICD-10-CM | POA: Diagnosis not present

## 2023-02-24 DIAGNOSIS — L97822 Non-pressure chronic ulcer of other part of left lower leg with fat layer exposed: Secondary | ICD-10-CM | POA: Diagnosis not present

## 2023-02-24 DIAGNOSIS — Z7952 Long term (current) use of systemic steroids: Secondary | ICD-10-CM | POA: Diagnosis not present

## 2023-02-24 DIAGNOSIS — D849 Immunodeficiency, unspecified: Secondary | ICD-10-CM | POA: Diagnosis not present

## 2023-02-24 DIAGNOSIS — J99 Respiratory disorders in diseases classified elsewhere: Secondary | ICD-10-CM | POA: Diagnosis not present

## 2023-02-24 DIAGNOSIS — M069 Rheumatoid arthritis, unspecified: Secondary | ICD-10-CM | POA: Diagnosis not present

## 2023-02-24 DIAGNOSIS — Z993 Dependence on wheelchair: Secondary | ICD-10-CM | POA: Diagnosis not present

## 2023-02-24 DIAGNOSIS — C7989 Secondary malignant neoplasm of other specified sites: Secondary | ICD-10-CM | POA: Diagnosis not present

## 2023-02-24 DIAGNOSIS — Z8673 Personal history of transient ischemic attack (TIA), and cerebral infarction without residual deficits: Secondary | ICD-10-CM | POA: Diagnosis not present

## 2023-02-24 DIAGNOSIS — I4811 Longstanding persistent atrial fibrillation: Secondary | ICD-10-CM | POA: Diagnosis not present

## 2023-02-24 DIAGNOSIS — E11622 Type 2 diabetes mellitus with other skin ulcer: Secondary | ICD-10-CM | POA: Diagnosis not present

## 2023-02-24 DIAGNOSIS — D472 Monoclonal gammopathy: Secondary | ICD-10-CM | POA: Diagnosis not present

## 2023-02-24 DIAGNOSIS — T8744 Infection of amputation stump, left lower extremity: Secondary | ICD-10-CM | POA: Diagnosis not present

## 2023-02-24 DIAGNOSIS — D630 Anemia in neoplastic disease: Secondary | ICD-10-CM | POA: Diagnosis not present

## 2023-02-24 DIAGNOSIS — D693 Immune thrombocytopenic purpura: Secondary | ICD-10-CM | POA: Diagnosis not present

## 2023-02-24 DIAGNOSIS — B0221 Postherpetic geniculate ganglionitis: Secondary | ICD-10-CM | POA: Diagnosis not present

## 2023-02-24 DIAGNOSIS — M1120 Other chondrocalcinosis, unspecified site: Secondary | ICD-10-CM | POA: Diagnosis not present

## 2023-02-24 DIAGNOSIS — E1151 Type 2 diabetes mellitus with diabetic peripheral angiopathy without gangrene: Secondary | ICD-10-CM | POA: Diagnosis not present

## 2023-02-24 DIAGNOSIS — M1180 Other specified crystal arthropathies, unspecified site: Secondary | ICD-10-CM | POA: Diagnosis not present

## 2023-02-24 DIAGNOSIS — I5022 Chronic systolic (congestive) heart failure: Secondary | ICD-10-CM | POA: Diagnosis not present

## 2023-02-24 DIAGNOSIS — E118 Type 2 diabetes mellitus with unspecified complications: Secondary | ICD-10-CM | POA: Diagnosis not present

## 2023-02-24 DIAGNOSIS — M6281 Muscle weakness (generalized): Secondary | ICD-10-CM | POA: Diagnosis not present

## 2023-02-24 DIAGNOSIS — R531 Weakness: Secondary | ICD-10-CM | POA: Diagnosis not present

## 2023-02-24 DIAGNOSIS — Z89511 Acquired absence of right leg below knee: Secondary | ICD-10-CM | POA: Diagnosis not present

## 2023-02-24 DIAGNOSIS — R5381 Other malaise: Secondary | ICD-10-CM | POA: Diagnosis not present

## 2023-02-24 DIAGNOSIS — B9561 Methicillin susceptible Staphylococcus aureus infection as the cause of diseases classified elsewhere: Secondary | ICD-10-CM | POA: Diagnosis not present

## 2023-02-24 DIAGNOSIS — K219 Gastro-esophageal reflux disease without esophagitis: Secondary | ICD-10-CM | POA: Diagnosis not present

## 2023-02-24 DIAGNOSIS — K819 Cholecystitis, unspecified: Secondary | ICD-10-CM | POA: Diagnosis not present

## 2023-02-24 DIAGNOSIS — M1711 Unilateral primary osteoarthritis, right knee: Secondary | ICD-10-CM | POA: Diagnosis not present

## 2023-02-24 DIAGNOSIS — Z7901 Long term (current) use of anticoagulants: Secondary | ICD-10-CM | POA: Diagnosis not present

## 2023-02-24 DIAGNOSIS — Z7984 Long term (current) use of oral hypoglycemic drugs: Secondary | ICD-10-CM | POA: Diagnosis not present

## 2023-02-24 DIAGNOSIS — Z89512 Acquired absence of left leg below knee: Secondary | ICD-10-CM | POA: Diagnosis not present

## 2023-02-24 DIAGNOSIS — A419 Sepsis, unspecified organism: Secondary | ICD-10-CM | POA: Diagnosis not present

## 2023-02-24 DIAGNOSIS — R41841 Cognitive communication deficit: Secondary | ICD-10-CM | POA: Diagnosis not present

## 2023-02-24 DIAGNOSIS — S81811A Laceration without foreign body, right lower leg, initial encounter: Secondary | ICD-10-CM | POA: Diagnosis not present

## 2023-02-24 DIAGNOSIS — D469 Myelodysplastic syndrome, unspecified: Secondary | ICD-10-CM | POA: Diagnosis not present

## 2023-02-24 DIAGNOSIS — Z7982 Long term (current) use of aspirin: Secondary | ICD-10-CM | POA: Diagnosis not present

## 2023-02-24 DIAGNOSIS — M5020 Other cervical disc displacement, unspecified cervical region: Secondary | ICD-10-CM | POA: Diagnosis not present

## 2023-02-24 DIAGNOSIS — I679 Cerebrovascular disease, unspecified: Secondary | ICD-10-CM | POA: Diagnosis not present

## 2023-02-24 DIAGNOSIS — Z89611 Acquired absence of right leg above knee: Secondary | ICD-10-CM | POA: Diagnosis not present

## 2023-02-24 DIAGNOSIS — R7881 Bacteremia: Secondary | ICD-10-CM | POA: Diagnosis not present

## 2023-02-24 DIAGNOSIS — R278 Other lack of coordination: Secondary | ICD-10-CM | POA: Diagnosis not present

## 2023-02-24 DIAGNOSIS — J849 Interstitial pulmonary disease, unspecified: Secondary | ICD-10-CM | POA: Diagnosis not present

## 2023-02-24 LAB — CULTURE, BLOOD (ROUTINE X 2)
Culture: NO GROWTH
Culture: NO GROWTH

## 2023-02-24 LAB — CBC
HCT: 32.2 % — ABNORMAL LOW (ref 39.0–52.0)
Hemoglobin: 10 g/dL — ABNORMAL LOW (ref 13.0–17.0)
MCH: 36.4 pg — ABNORMAL HIGH (ref 26.0–34.0)
MCHC: 31.1 g/dL (ref 30.0–36.0)
MCV: 117.1 fL — ABNORMAL HIGH (ref 80.0–100.0)
Platelets: 93 10*3/uL — ABNORMAL LOW (ref 150–400)
RBC: 2.75 MIL/uL — ABNORMAL LOW (ref 4.22–5.81)
RDW: 26.5 % — ABNORMAL HIGH (ref 11.5–15.5)
WBC: 7.4 10*3/uL (ref 4.0–10.5)
nRBC: 4.2 % — ABNORMAL HIGH (ref 0.0–0.2)

## 2023-02-24 LAB — GLUCOSE, CAPILLARY
Glucose-Capillary: 148 mg/dL — ABNORMAL HIGH (ref 70–99)
Glucose-Capillary: 224 mg/dL — ABNORMAL HIGH (ref 70–99)
Glucose-Capillary: 255 mg/dL — ABNORMAL HIGH (ref 70–99)

## 2023-02-24 LAB — COMPREHENSIVE METABOLIC PANEL
ALT: 11 U/L (ref 0–44)
AST: 24 U/L (ref 15–41)
Albumin: 2 g/dL — ABNORMAL LOW (ref 3.5–5.0)
Alkaline Phosphatase: 53 U/L (ref 38–126)
Anion gap: 5 (ref 5–15)
BUN: 16 mg/dL (ref 8–23)
CO2: 26 mmol/L (ref 22–32)
Calcium: 7.7 mg/dL — ABNORMAL LOW (ref 8.9–10.3)
Chloride: 106 mmol/L (ref 98–111)
Creatinine, Ser: 0.74 mg/dL (ref 0.61–1.24)
GFR, Estimated: >60 mL/min (ref >60)
Glucose, Bld: 166 mg/dL — ABNORMAL HIGH (ref 70–99)
Potassium: 3.8 mmol/L (ref 3.5–5.1)
Sodium: 137 mmol/L (ref 135–145)
Total Bilirubin: 3.2 mg/dL — ABNORMAL HIGH (ref <1.2)
Total Protein: 4.6 g/dL — ABNORMAL LOW (ref 6.5–8.1)

## 2023-02-24 MED ORDER — CEFAZOLIN IV (FOR PTA / DISCHARGE USE ONLY): 2.0000 g | Freq: Three times a day (TID) | INTRAVENOUS | Status: AC

## 2023-02-24 MED ORDER — APIXABAN 5 MG PO TABS
5.0000 mg | ORAL_TABLET | Freq: Two times a day (BID) | ORAL | Status: DC
  Administered 2023-02-24: 5 mg via ORAL
  Filled 2023-02-24: qty 1

## 2023-02-24 MED ORDER — CEFAZOLIN IV (FOR PTA / DISCHARGE USE ONLY): 2.0000 g | Freq: Three times a day (TID) | INTRAVENOUS | 0 refills | Status: DC

## 2023-02-24 MED ORDER — INSULIN ASPART 100 UNIT/ML IJ SOLN: 0.0000 [IU] | Freq: Three times a day (TID) | INTRAMUSCULAR | Status: DC

## 2023-02-24 MED ORDER — INSULIN GLARGINE-YFGN 100 UNIT/ML ~~LOC~~ SOLN
10.0000 [IU] | Freq: Every day | SUBCUTANEOUS | Status: DC
  Administered 2023-02-24: 10 [IU] via SUBCUTANEOUS
  Filled 2023-02-24: qty 0.1

## 2023-02-24 MED ORDER — PREGABALIN 100 MG PO CAPS: 100.0000 mg | ORAL_CAPSULE | Freq: Three times a day (TID) | ORAL | 0 refills | Status: DC

## 2023-02-24 MED ORDER — HYDROXYZINE HCL 10 MG PO TABS: 10.0000 mg | ORAL_TABLET | Freq: Three times a day (TID) | ORAL | Status: DC | PRN

## 2023-02-24 NOTE — Plan of Care (Signed)

## 2023-02-24 NOTE — Discharge Summary (Addendum)
Physician Discharge Summary   Patient: David Joseph MRN: [id number] DOB: 08/09/1940  Admit date:     02/15/2023  Discharge date: 02/24/23  Discharge Physician: Meredeth Ide   PCP: Donetta Potts, MD   Recommendations at discharge:   Follow-up gastroenterology at [facility] as outpatient Repeat CBC in 1 week Follow-up oncology as outpatient As per patient's oncologist Dr. Mosetta Putt -regarding Reblozyl injections, it is ok for the patient not to receive the injection while in the nursing facility and can pursue once discharged.   Continue antibiotics till 04/01/2023  Discharge Diagnoses: Principal Problem:   Cholecystitis Active Problems:   OA (osteoarthritis) of knee   Rheumatoid arthritis (HCC)   Chronic systolic congestive heart failure (HCC)   Anemia   S/P unilateral BKA (below knee amputation), right (HCC)   Severe sepsis (HCC)   MSSA bacteremia   Bilirubinemia   Elevated liver enzymes  Resolved Problems:   * No resolved hospital problems. *  Hospital Course: 82 y.o. 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  On presentation, he was febrile to 101.4 with initial lactic acid of 8 which improved to 3 after IV fluids.  AST of 116 and ALT of 182 with total bilirubin of 12.8.  Influenza/COVID-19/RSV PCR negative.  CT of abdomen showed possible acute diverticulitis and gallbladder with sludge and some gallbladder wall thickening.  Ultrasound abdomen equivocal for acute cholecystitis and negative Murphy sign.  He was started on IV antibiotics.  General surgery at [facility] recommended HIDA scan and if positive, consider cholecystostomy drain.  He was  transferred to [facility] for the same.  GI and general surgery were consulted at [facility].  ID was auto consulted for MSSA bacteremia.  Heme-onc was consulted on 02/17/2023.    Assessment and Plan:  Abdominal pain and hyperbilirubinemia and elevated LFTs -Patient was transferred from [facility] for HIDA scan and surgical evaluation -General Surgery was consulted : General surgery not convinced that patient has acute cholecystitis.  HIDA scan canceled by general surgery.  GI following as well.  MRCP showed cholelithiasis without evidence of acute cholecystitis -LFTs improving as well including total bilirubin. -Statin has been discontinued   Bilateral thigh edema -Likely in setting of hypoalbuminemia, albumin level 2.0 -Continue nutrition supplementation   Possible acute diverticulitis -As seen on imaging.  GI is not convinced that patient has diverticulitis.     MSSA bacteremia -Antibiotics have been switched to IV Ancef and Flagyl by ID.  Possible source from left stump wound.   Also had Port-A-Cath which was removed by general surgery   Follow repeat blood cultures from 02/17/2023.   Cardiology cancelled TEE due to thrombocytopenia, platelet count should be greater than 50,000 before procedure  -Discussed with ID, patient will be on 6 weeks of IV antibiotics.  After discussion with patient and his wife, ID has decided not to pursue with TEE. -PICC line placed yesterday -Continue IV cefazolin till 04/01/2023   Thrombocytopenia -Patient has chronic thrombocytopenia but has worsened since admission.   Platelets 22000 on 02/20/2023 -Significantly improved this morning with platelet count 93,000    Acute metabolic acidosis -Resolved   Diabetes mellitus type 2 with hyperglycemia -Continue with with SSI.   -  CBG well-controlled -Continue glipizide   Paroxysmal A-fib -Rate currently controlled.  Eliquis on hold.   Eliquis has been restarted, continue  low-dose metoprolol 12.5 mg p.o. twice daily   Myelodysplastic syndrome -Patient had Reblozyl injection on 02/11/2023.   Follows with oncology as outpatient - As per Dr Mosetta Putt A Reblozyl injections.  it is ok for the patient not to receive the injection while in the nursing facility and can pursue once discharged.   -Check CBC in 1 to 2 weeks after discharge   Anemia of chronic disease -From chronic illness.   Hemoglobin dropped to 6.2 on 02/16/2023.  Status post 2 unit packed red cell transfusion.   -Hemoglobin is 10.0 today   Chronic systolic heart failure -Currently compensated.  Strict input and output and daily weights.  Continue metoprolol succinate.  Outpatient follow-up with cardiology   CPPD/RA -On Plaquenil and prednisone.   Outpatient follow-up with rheumatology   Ramsay Hunt syndrome -Continue Valtrex daily   Bilateral BKA with history of PAD          Consultants: General Surgery, infectious disease, gastroenterology Procedures performed:  Disposition: Skilled nursing facility Diet recommendation:  Discharge Diet Orders (From admission, onward)     Start     Ordered   02/24/23 0000  Diet - low sodium heart healthy        02/24/23 1144           Regular diet DISCHARGE MEDICATION: Allergies as of 02/24/2023       Reactions   Iodinated Contrast Media Rash   Can tolerate IV dye with steroid protocol        Medication List     STOP taking these medications    atorvastatin 80 MG tablet Commonly known as: LIPITOR   oxycodone 5 MG capsule Commonly known as: OXY-IR       TAKE these medications    ceFAZolin IVPB Commonly known as: ANCEF Inject 2 g into the vein every 8 (eight) hours. Indication:  MSSA bacteremia First Dose: Yes Last Day of Therapy:  04/01/23 Labs - Once weekly:  CBC/D and BMP, Labs - Once weekly: ESR and CRP Method of administration: IVPB Method of administration may be changed at the discretion of home infusion pharmacist  based upon assessment of the patient and/or caregiver's ability to self-administer the medication ordered.   Eliquis 5 MG Tabs tablet Generic drug: apixaban Take 5 mg by mouth 2 (two) times daily.   glipiZIDE 5 MG tablet Commonly known as: GLUCOTROL Take 5 mg by mouth daily.   hydroxychloroquine 200 MG tablet Commonly known as: PLAQUENIL Take 200 mg by mouth daily.   hydrOXYzine 10 MG tablet Commonly known as: ATARAX Take 1 tablet (10 mg total) by mouth 3 (three) times daily as needed for itching.   insulin aspart 100 UNIT/ML injection Commonly known as: novoLOG Inject 0-15 Units into the skin 3 (three) times daily with meals. Sliding scale insulin Less than 70 initiate hypoglycemia protocol 70-120  0 units 120-150 2unit 151-200 3 units 201-250 5units 251-300 8 units 301-350 11 units 351-400 15 units Greater than 400 call MD   lactose free nutrition Liqd Take 237 mLs by mouth daily.   metoprolol succinate 25 MG 24 hr tablet Commonly known as: TOPROL-XL Take 0.5 tablets (12.5 mg total) by mouth 2 (two) times daily.   pantoprazole 40 MG tablet Commonly known as: PROTONIX Take 1 tablet (40 mg total) by mouth daily.   polyethylene glycol 17 g packet Commonly known  as: MIRALAX / GLYCOLAX Take 17 g by mouth daily as needed.   predniSONE 5 MG tablet Commonly known as: DELTASONE Take 15 mg by mouth daily.   pregabalin 100 MG capsule Commonly known as: LYRICA Take 1 capsule (100 mg total) by mouth 3 (three) times daily. What changed: when to take this   REBLOZYL [place] Inject into the skin every 21 ( twenty-one) days.   valACYclovir 500 MG tablet Commonly known as: VALTREX Take 500 mg by mouth daily.   Vitamin D3 1000 units Caps Take 1 tablet by mouth daily.               Discharge Care Instructions  (From admission, onward)           Start     Ordered   02/24/23 0000  Change dressing on IV access line weekly and PRN  (Home infusion instructions - [facility] )        02/24/23 1144   02/24/23 0000  Discharge wound care:       Comments: Wound care  Daily      Comments: Cut piece of Aquacel [place] # [id number]) and tuck over left upper stump wound Q day, then cover with foam dressing.  Change foam dressing Q 3 days or PRN soiling   02/24/23 1144            Contact information for follow-up providers     Comer, Belia Heman, MD Follow up on 03/20/2023.   Specialty: Infectious Diseases Why: Hospital Discharge Follow Up 9:30 am Contact information: [address] [place] [address] [phone]              Contact information for after-discharge care     Destination     [facility] .   Service: Skilled Nursing Contact information: [address] [place] [address] [phone]                    Discharge Exam: Ceasar Mons Weights   02/22/23 0410 02/23/23 0500 02/24/23 0440  Weight: 75.5 kg 74.5 kg 77.4 kg   General-appears in no acute distress Heart-S1-S2, regular, no murmur auscultated Lungs-clear to auscultation bilaterally, no wheezing or crackles auscultated Abdomen-soft, nontender, no organomegaly Extremities-no edema in the lower extremities Neuro-alert, oriented x3, no focal deficit noted  Condition at discharge: good  The results of significant diagnostics from this hospitalization (including imaging, microbiology, ancillary and laboratory) are listed below for reference.   Imaging Studies: [place] EKG SITE RITE  Result Date: 02/20/2023 If Site Rite image not attached, placement could not be confirmed due to current cardiac rhythm.  MR ABDOMEN MRCP W WO CONTAST  Result Date: 02/18/2023 CLINICAL DATA:  Jaundice, cholelithiasis EXAM: MRI ABDOMEN WITHOUT AND WITH CONTRAST (INCLUDING MRCP) TECHNIQUE: Multiplanar multisequence MR imaging of the abdomen was performed both before and after the administration of intravenous contrast. Heavily T2-weighted  images of the biliary and pancreatic ducts were obtained, and three-dimensional MRCP images were rendered by post processing. CONTRAST:  6mL GADAVIST GADOBUTROL 1 MMOL/ML IV SOLN COMPARISON:  CT chest abdomen pelvis, 02/15/2023, right upper quadrant ultrasound, 02/15/2023 FINDINGS: Lower chest: Cardiomegaly.  Small bilateral pleural effusions. Hepatobiliary: No solid liver abnormality is seen. Hepatic signal inversion on in and opposed phase imaging as well as intrinsic T2 hypointensity of the liver. The gallbladder is filled with small gallstones (series 5, image 20). No gallbladder wall thickening or pericholecystic fluid. No biliary ductal dilatation. Pancreas:  Unremarkable. No pancreatic ductal dilatation or surrounding inflammatory changes. Spleen: Normal in size. Splenic signal inversion on in and opposed phase imaging as well as intrinsic T2 hypointensity of the spleen. Adrenals/Urinary Tract: Adrenal glands are unremarkable. Simple, benign bilateral renal cortical cysts, for which no further follow-up or characterization is required. Kidneys are otherwise normal, without obvious renal calculi, solid lesion, or hydronephrosis. Stomach/Bowel: Stomach is within normal limits. No evidence of bowel wall thickening, distention, or inflammatory changes. Vascular/Lymphatic: Aortic atherosclerosis. No enlarged abdominal lymph nodes. Other: No abdominal wall hernia.  Anasarca.  No ascites. Musculoskeletal: No acute or significant osseous findings. IMPRESSION: 1. Cholelithiasis without evidence of acute cholecystitis. 2. No biliary ductal dilatation or choledocholithiasis. 3. Hepatic and splenic signal characteristics consistent with iron deposition. 4. Cardiomegaly. 5. Small bilateral pleural effusions. 6. Anasarca. Aortic Atherosclerosis (ICD10-I70.0). Electronically Signed   By: Jearld Lesch M.D.   On: 02/18/2023 08:28   MR 3D Recon At Scanner  Result Date: 02/18/2023 CLINICAL DATA:  Jaundice, cholelithiasis  EXAM: MRI ABDOMEN WITHOUT AND WITH CONTRAST (INCLUDING MRCP) TECHNIQUE: Multiplanar multisequence MR imaging of the abdomen was performed both before and after the administration of intravenous contrast. Heavily T2-weighted images of the biliary and pancreatic ducts were obtained, and three-dimensional MRCP images were rendered by post processing. CONTRAST:  6mL GADAVIST GADOBUTROL 1 MMOL/ML IV SOLN COMPARISON:  CT chest abdomen pelvis, 02/15/2023, right upper quadrant ultrasound, 02/15/2023 FINDINGS: Lower chest: Cardiomegaly.  Small bilateral pleural effusions. Hepatobiliary: No solid liver abnormality is seen. Hepatic signal inversion on in and opposed phase imaging as well as intrinsic T2 hypointensity of the liver. The gallbladder is filled with small gallstones (series 5, image 20). No gallbladder wall thickening or pericholecystic fluid. No biliary ductal dilatation. Pancreas: Unremarkable. No pancreatic ductal dilatation or surrounding inflammatory changes. Spleen: Normal in size. Splenic signal inversion on in and opposed phase imaging as well as intrinsic T2 hypointensity of the spleen. Adrenals/Urinary Tract: Adrenal glands are unremarkable. Simple, benign bilateral renal cortical cysts, for which no further follow-up or characterization is required. Kidneys are otherwise normal, without obvious renal calculi, solid lesion, or hydronephrosis. Stomach/Bowel: Stomach is within normal limits. No evidence of bowel wall thickening, distention, or inflammatory changes. Vascular/Lymphatic: Aortic atherosclerosis. No enlarged abdominal lymph nodes. Other: No abdominal wall hernia.  Anasarca.  No ascites. Musculoskeletal: No acute or significant osseous findings. IMPRESSION: 1. Cholelithiasis without evidence of acute cholecystitis. 2. No biliary ductal dilatation or choledocholithiasis. 3. Hepatic and splenic signal characteristics consistent with iron deposition. 4. Cardiomegaly. 5. Small bilateral pleural  effusions. 6. Anasarca. Aortic Atherosclerosis (ICD10-I70.0). Electronically Signed   By: Jearld Lesch M.D.   On: 02/18/2023 08:28   ECHOCARDIOGRAM COMPLETE BUBBLE STUDY  Result Date: 02/17/2023    ECHOCARDIOGRAM REPORT   Patient Name:   David Joseph Date of Exam: 02/17/2023 Medical Rec #:  [id number]       Height:       68.0 in Accession #:    [id number]      Weight:       132.3 lb Date of Birth:  [birth year]       BSA:          1.714 m Patient Age:    82 years        BP:           107/57 mmHg Patient Gender: M               HR:  71 bpm. Exam Location:  Inpatient Procedure: 2D Echo, Cardiac Doppler and Color Doppler Indications:    Endocarditis  History:        Patient has no prior history of Echocardiogram examinations.                 CHF, Stroke and PAD, Arrythmias:Atrial Fibrillation; Risk                 Factors:Diabetes and Former Smoker. Myelodysplastic syndrome,                 ILD.  Sonographer:    Milda Smart Referring Phys: Gwyneth Sprout [facility] IMPRESSIONS  1. Left ventricular ejection fraction, by estimation, is 35 to 40%. Left ventricular ejection fraction by 2D MOD biplane is 38.9 %. The left ventricle has moderately decreased function. The left ventricle demonstrates global hypokinesis. Left ventricular diastolic parameters are consistent with Grade III diastolic dysfunction (restrictive). Elevated left ventricular end-diastolic pressure.  2. Right ventricular systolic function is moderately reduced. The right ventricular size is normal. There is normal pulmonary artery systolic pressure. The estimated right ventricular systolic pressure is 21.5 mmHg.  3. Left atrial size was mildly dilated.  4. (Noted prior transseptal atrial puncture for Afib ablation at [facility] in 01/2022). Evidence of atrial level shunting detected by color flow Doppler. 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  5. The mitral valve is  abnormal. Mild to moderate mitral valve regurgitation.  6. The aortic valve is tricuspid. Aortic valve regurgitation is not visualized.  7. The aortic root is calcified. Aortic dilatation noted. There is borderline dilatation of the ascending aorta, measuring 38 mm.  8. The inferior vena cava is dilated in size with >50% respiratory variability, suggesting right atrial pressure of 8 mmHg. Comparison(s): Prior images unable to be directly viewed, comparison made by report only. Changes from prior study are noted. 12/03/2021 [facility]): LVEF 40-45%. Conclusion(s)/Recommendation(s): No evidence of valvular vegetations on this transthoracic echocardiogram. Consider a transesophageal echocardiogram to exclude infective endocarditis if clinically indicated. FINDINGS  Left Ventricle: Left ventricular ejection fraction, by estimation, is 35 to 40%. Left ventricular ejection fraction by 2D MOD biplane is 38.9 %. The left ventricle has moderately decreased function. The left ventricle demonstrates global hypokinesis. The left ventricular internal cavity size was normal in size. There is borderline concentric left ventricular hypertrophy. Left ventricular diastolic parameters are consistent with Grade III diastolic dysfunction (restrictive). Elevated left ventricular end-diastolic pressure. Right Ventricle: The right ventricular size is normal. No increase in right ventricular wall thickness. Right ventricular systolic function is moderately reduced. There is normal pulmonary artery systolic pressure. The tricuspid regurgitant velocity is 1.84 m/s, and with an assumed right atrial pressure of 8 mmHg, the estimated right ventricular systolic pressure is 21.5 mmHg. Left Atrium: Left atrial size was mildly dilated. Right Atrium: Right atrial size was normal in size. Pericardium: There is no evidence of pericardial effusion. Mitral Valve: The mitral valve is abnormal. There is mild thickening of the mitral valve leaflet(s). Mild to  moderate mitral annular calcification. Mild to moderate mitral valve regurgitation, with centrally-directed jet. Tricuspid Valve: The tricuspid valve is grossly normal. Tricuspid valve regurgitation is trivial. Aortic Valve: The aortic valve is tricuspid. Aortic valve regurgitation is not visualized. Pulmonic Valve: The pulmonic valve was grossly normal. Pulmonic valve regurgitation is trivial. Aorta: The aortic root is calcified. Aortic dilatation noted. There is borderline dilatation of the ascending aorta, measuring 38  mm. Venous: The inferior vena cava is dilated in size with greater than 50% respiratory variability, suggesting right atrial pressure of 8 mmHg. IAS/Shunts: Evidence of atrial level shunting detected by color flow Doppler. 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  LEFT VENTRICLE PLAX 2D                        Biplane EF (MOD) LVIDd:         4.70 cm         LV Biplane EF:   Left LVIDs:         3.90 cm                          ventricular LV PW:         1.50 cm                          ejection LV IVS:        1.30 cm                          fraction by LVOT diam:     2.00 cm                          2D MOD LV SV:         42                               biplane is LV SV Index:   25                               38.9 %. LVOT Area:     3.14 cm                                Diastology                                LV e' medial:    4.12 cm/s LV Volumes (MOD)               LV E/e' medial:  25.7 LV vol d, MOD    76.1 ml       LV e' lateral:   5.17 cm/s A2C:                           LV E/e' lateral: 20.5 LV vol d, MOD    90.0 ml A4C: LV vol s, MOD    51.5 ml A2C: LV vol s, MOD    52.1 ml A4C: LV SV MOD A2C:   24.6 ml LV SV MOD A4C:   90.0 ml LV SV MOD BP:    33.7 ml RIGHT VENTRICLE            IVC RV S prime:     7.62 cm/s  IVC diam: 1.90 cm TAPSE (M-mode): 1.8 cm LEFT ATRIUM             Index  RIGHT ATRIUM            Index LA diam:        3.60 cm 2.10 cm/m   RA Area:     18.60 cm LA Vol (A2C):   76.0 ml 44.33 ml/m  RA Volume:   48.90 ml  28.52 ml/m LA Vol (A4C):   51.9 ml 30.27 ml/m LA Biplane Vol: 62.4 ml 36.40 ml/m  AORTIC VALVE LVOT Vmax:   71.50 cm/s LVOT Vmean:  51.000 cm/s LVOT VTI:    0.134 m  AORTA Ao Root diam: 3.30 cm Ao Asc diam:  3.80 cm MITRAL VALVE                TRICUSPID VALVE MV Area (PHT): 4.46 cm     TR Peak grad:   13.5 mmHg MV Decel Time: 170 msec     TR Vmax:        184.00 cm/s MR Peak grad: 84.6 mmHg MR Mean grad: 53.5 mmHg     SHUNTS MR Vmax:      460.00 cm/s   Systemic VTI:  0.13 m MR Vmean:     345.0 cm/s    Systemic Diam: 2.00 cm MV E velocity: 106.00 cm/s MV A velocity: 35.60 cm/s MV E/A ratio:  2.98 Zoila Shutter MD Electronically signed by Zoila Shutter MD Signature Date/Time: 02/17/2023/2:34:49 PM    Final    US Abdomen Limited RUQ (LIVER/GB)  Result Date: 02/15/2023 CLINICAL DATA:  Fever.  Cholelithiasis. EXAM: ULTRASOUND ABDOMEN LIMITED RIGHT UPPER QUADRANT COMPARISON:  None Available. FINDINGS: Gallbladder: Gallbladder is completely filled with numerous echogenic, shadowing stones resulting in a wall-echo-shadow sign. The visualized gallbladder wall is mildly thickened. No sonographic Murphy sign noted by sonographer. Common bile duct: Diameter: 4 mm. Liver: No focal lesion identified. Within normal limits in parenchymal echogenicity. Portal vein is patent on color Doppler imaging with normal direction of blood flow towards the liver. Other: Right pleural effusion. IMPRESSION: 1. Cholelithiasis with mild gallbladder wall thickening. No sonographic Murphy sign noted by sonographer. Findings are equivocal for acute cholecystitis. A nuclear medicine HIDA scan could be obtained if further assessment is clinically warranted. 2. Right pleural effusion. Electronically Signed   By: Duanne Guess D.O.   On: 02/15/2023 10:26   CT CHEST ABDOMEN PELVIS WO CONTRAST  Result Date:  02/15/2023 CLINICAL DATA:  82 year old male with history of generalized weakness. Right lower abdominal pain for the past 6 months. History of prostate cancer. * Tracking Code: BO * EXAM: CT CHEST, ABDOMEN AND PELVIS WITHOUT CONTRAST TECHNIQUE: Multidetector CT imaging of the chest, abdomen and pelvis was performed following the standard protocol without IV contrast. RADIATION DOSE REDUCTION: This exam was performed according to the departmental dose-optimization program which includes automated exposure control, adjustment of the mA and/or kV according to patient size and/or use of iterative reconstruction technique. COMPARISON:  None Available. FINDINGS: CT CHEST FINDINGS Cardiovascular: Heart size is borderline enlarged. Trace amount of pericardial fluid and/or thickening, unlikely to be of any hemodynamic significance at this time. No pericardial calcification. There is aortic atherosclerosis, as well as atherosclerosis of the great vessels of the mediastinum and the coronary arteries, including calcified atherosclerotic plaque in the left main, left anterior descending, left circumflex and right coronary arteries. Right internal jugular single-lumen Port-A-Cath with tip terminating in the distal superior vena cava. Extensive calcifications of the aortic valve. Mediastinum/Nodes: No pathologically enlarged mediastinal or hilar lymph nodes. Please note that accurate exclusion of hilar adenopathy is limited  on noncontrast CT scans. Esophagus is unremarkable in appearance. No axillary lymphadenopathy. Lungs/Pleura: Small bilateral pleural effusions lying dependently (left-greater-than-right). No confluent consolidative airspace disease. No pleural effusions. Scattered areas of mild post infectious or inflammatory scarring, most evident in the inferior segment of the lingula. No definite suspicious appearing pulmonary nodules or masses are noted. Musculoskeletal: Orthopedic fixation hardware in the lower cervical  spine incidentally noted. There are no aggressive appearing lytic or blastic lesions noted in the visualized portions of the skeleton. CT ABDOMEN PELVIS FINDINGS Hepatobiliary: Liver has a slightly shrunken appearance and nodular contour. No definite suspicious cystic or solid hepatic lesions are confidently identified on today's noncontrast CT examination. Gallbladder is only moderately distended but is filled with amorphous high attenuation material, presumably biliary sludge. Trace volume of pericholecystic fluid. Pancreas: No definite pancreatic mass or peripancreatic fluid collections or inflammatory changes are noted on today's noncontrast CT examination. Spleen: Unremarkable. Adrenals/Urinary Tract: 4.4 cm exophytic low-attenuation lesion in the medial aspect of the upper pole of the right kidney, incompletely characterized on today's noncontrast CT examination, but statistically likely a cyst (no imaging follow-up recommended). Unenhanced appearance of the left kidney and bilateral adrenal glands is normal. No hydroureteronephrosis. Urinary bladder is normal in appearance. Stomach/Bowel: Unenhanced appearance of the stomach is normal. No pathologic dilatation of small bowel or colon. Subtle inflammatory changes are noted adjacent to the descending colon where there are multiple diverticuli, concerning for possible acute diverticulitis. No discrete diverticular abscess or signs of frank perforation or confidently identified at this time. The appendix is not confidently identified and may be surgically absent. Regardless, there are no inflammatory changes noted adjacent to the cecum to suggest the presence of an acute appendicitis at this time. Vascular/Lymphatic: Atherosclerosis in the abdominal aorta and pelvic vasculature. No definite lymphadenopathy noted in the abdomen or pelvis. Reproductive: Status post radical prostatectomy. Other: No significant volume of ascites.  No pneumoperitoneum.  Musculoskeletal: There are no aggressive appearing lytic or blastic lesions noted in the visualized portions of the skeleton. IMPRESSION: 1. Subtle inflammatory changes adjacent to the descending colon, which could indicate acute diverticulitis. No diverticular abscess or signs of frank perforation are noted at this time. 2. Gallbladder is moderately distended and filled with a large amount of biliary sludge. Trace volume of pericholecystic fluid noted. The possibility of acute cholecystitis should be considered. If there is any clinical concern for acute cholecystitis, further evaluation with right upper quadrant abdominal ultrasound would be recommended. 3. Small bilateral pleural effusions lying dependently. 4. Aortic atherosclerosis, in addition to left main and three-vessel coronary artery disease. 5. Trace amount of pericardial fluid and/or thickening, unlikely to be of hemodynamic significance at this time. 6. There are calcifications of the aortic valve. Echocardiographic correlation for evaluation of potential valvular dysfunction may be warranted if clinically indicated. 7. Morphologic changes in the liver suggestive of underlying cirrhosis. 8. Additional incidental imaging findings, as above. Electronically Signed   By: Trudie Reed M.D.   On: 02/15/2023 09:13   DG Chest Port 1 View  Result Date: 02/15/2023 CLINICAL DATA:  Questionable sepsis. EXAM: PORTABLE CHEST 1 VIEW COMPARISON:  05/30/2010 FINDINGS: Cardiopericardial enlargement and aortic tortuosity. Diffuse interstitial prominence with congested appearance of central vessels and symmetric hazy density at the bases. No visible effusion or pneumothorax. Right port with tip at the SVC. IMPRESSION: Cardiomegaly and vascular congestion. Pneumonia cannot be excluded at the bases. Electronically Signed   By: Tiburcio Pea M.D.   On: 02/15/2023 07:56  Microbiology: Results for orders placed or performed during the hospital encounter of  02/15/23  Blood Culture (routine x 2)     Status: Abnormal   Collection Time: 02/15/23  7:02 AM   Specimen: BLOOD LEFT FOREARM  Result Value Ref Range Status   Specimen Description   Final    BLOOD LEFT FOREARM BOTTLES DRAWN AEROBIC AND ANAEROBIC Performed at [facility], [address] [place], [place] [address]    Special Requests   Final    Blood Culture results may not be optimal due to an inadequate volume of blood received in culture bottles Performed at [facility], [address], [place], [place] [address]    Culture  Setup Time   Final    GRAM POSITIVE COCCI IN BOTH AEROBIC AND ANAEROBIC BOTTLES Gram Stain Report Called to,Read Back By and Verified With: S RICH AT 0752 ON [id number] BY S DALTON CRITICAL VALUE NOTED.  VALUE IS CONSISTENT WITH PREVIOUSLY REPORTED AND CALLED VALUE. CRITICAL RESULT CALLED TO, READ BACK BY AND VERIFIED WITH: Rutherford MEDINA @ [facility] 7829 [phone], Darrin Nipper Performed at [facility], [address], [place], [place] [address]    Culture (A)  Final    STAPHYLOCOCCUS AUREUS SUSCEPTIBILITIES PERFORMED ON PREVIOUS CULTURE WITHIN THE LAST 5 DAYS. Performed at [facility], [address] [place], [place] [address]    Report Status 02/18/2023 FINAL  Final  Resp panel by RT-PCR (RSV, Flu A&B, Covid) Anterior Nasal Swab     Status: None   Collection Time: 02/15/23  7:17 AM   Specimen: Anterior Nasal Swab  Result Value Ref Range Status   SARS Coronavirus 2 by RT PCR NEGATIVE NEGATIVE Final    Comment: (NOTE) SARS-CoV-2 target nucleic acids are NOT DETECTED.  The SARS-CoV-2 RNA is generally detectable in upper respiratory specimens during the acute phase of infection. The lowest concentration of SARS-CoV-2 viral copies this assay can detect is 138 copies/mL. A negative result does not preclude SARS-Cov-2 infection and should not be used as the sole basis for treatment or other patient management decisions. A negative result may occur with  improper  specimen collection/handling, submission of specimen other than nasopharyngeal swab, presence of viral mutation(s) within the areas targeted by this assay, and inadequate number of viral copies(<138 copies/mL). A negative result must be combined with clinical observations, patient history, and epidemiological information. The expected result is Negative.  Fact Sheet for Patients:  BloggerCourse.com  Fact Sheet for Healthcare Providers:  SeriousBroker.it  This test is no t yet approved or cleared by the [place] FDA and  has been authorized for detection and/or diagnosis of SARS-CoV-2 by FDA under an Emergency Use Authorization (EUA). This EUA will remain  in effect (meaning this test can be used) for the duration of the COVID-19 declaration under Section 564(b)(1) of the Act, 21 U.S.C.section 360bbb-3(b)(1), unless the authorization is terminated  or revoked sooner.       Influenza A by PCR NEGATIVE NEGATIVE Final   Influenza B by PCR NEGATIVE NEGATIVE Final    Comment: (NOTE) The Xpert Xpress SARS-CoV-2/FLU/RSV plus assay is intended as an aid in the diagnosis of influenza from Nasopharyngeal swab specimens and should not be used as a sole basis for treatment. Nasal washings and aspirates are unacceptable for Xpert Xpress SARS-CoV-2/FLU/RSV testing.  Fact Sheet for Patients: BloggerCourse.com  Fact Sheet for Healthcare Providers: SeriousBroker.it  This test is not yet approved or cleared by the [place] and has been authorized for  detection and/or diagnosis of SARS-CoV-2 by FDA under an Emergency Use Authorization (EUA). This EUA will remain in effect (meaning this test can be used) for the duration of the COVID-19 declaration under Section 564(b)(1) of the Act, 21 U.S.C. section 360bbb-3(b)(1), unless the authorization is terminated or revoked.     Resp  Syncytial Virus by PCR NEGATIVE NEGATIVE Final    Comment: (NOTE) Fact Sheet for Patients: BloggerCourse.com  Fact Sheet for Healthcare Providers: SeriousBroker.it  This test is not yet approved or cleared by the [place] FDA and has been authorized for detection and/or diagnosis of SARS-CoV-2 by FDA under an Emergency Use Authorization (EUA). This EUA will remain in effect (meaning this test can be used) for the duration of the COVID-19 declaration under Section 564(b)(1) of the Act, 21 U.S.C. section 360bbb-3(b)(1), unless the authorization is terminated or revoked.  Performed at [facility], [address] [place], [place] [address]   Blood Culture (routine x 2)     Status: Abnormal   Collection Time: 02/15/23  7:28 AM   Specimen: BLOOD RIGHT ARM  Result Value Ref Range Status   Specimen Description   Final    BLOOD RIGHT ARM BOTTLES DRAWN AEROBIC AND ANAEROBIC Performed at [facility], [address], [place], [place] [address]    Special Requests   Final    Blood Culture adequate volume Performed at [facility], [address] [place], [place] [address]    Culture  Setup Time   Final    GRAM POSITIVE COCCI IN BOTH AEROBIC AND ANAEROBIC BOTTLES Gram Stain Report Called to,Read Back By and Verified With: S RICH AT 0752 ON [id number] BY S DALTON CRITICAL RESULT CALLED TO, READ BACK BY AND VERIFIED WITH: PHARMD JESSICA MILLEN [id number] AT 1414 BY EC Performed at [facility], [address], [place], [place] [address]    Culture STAPHYLOCOCCUS AUREUS (A)  Final   Report Status 02/18/2023 FINAL  Final   Organism ID, Bacteria STAPHYLOCOCCUS AUREUS  Final      Susceptibility   Staphylococcus aureus - MIC*    CIPROFLOXACIN <=0.5 SENSITIVE Sensitive     ERYTHROMYCIN <=0.25 SENSITIVE Sensitive     GENTAMICIN <=0.5 SENSITIVE Sensitive     OXACILLIN 0.5 SENSITIVE Sensitive     TETRACYCLINE <=1 SENSITIVE Sensitive      VANCOMYCIN <=0.5 SENSITIVE Sensitive     TRIMETH/SULFA <=10 SENSITIVE Sensitive     CLINDAMYCIN <=0.25 SENSITIVE Sensitive     RIFAMPIN <=0.5 SENSITIVE Sensitive     Inducible Clindamycin NEGATIVE Sensitive     LINEZOLID 2 SENSITIVE Sensitive     * STAPHYLOCOCCUS AUREUS  Blood Culture ID Panel (Reflexed)     Status: Abnormal   Collection Time: 02/15/23  7:28 AM  Result Value Ref Range Status   Enterococcus faecalis NOT DETECTED NOT DETECTED Final   Enterococcus Faecium NOT DETECTED NOT DETECTED Final   Listeria monocytogenes NOT DETECTED NOT DETECTED Final   Staphylococcus species DETECTED (A) NOT DETECTED Final    Comment: CRITICAL RESULT CALLED TO, READ BACK BY AND VERIFIED WITH: PHARMD JESSICA MILLEN [id number] AT 1414 BY EC    Staphylococcus aureus (BCID) DETECTED (A) NOT DETECTED Final    Comment: CRITICAL RESULT CALLED TO, READ BACK BY AND VERIFIED WITH: PAHRAM JESSIC MILLEN [id number] AT 1414 BY EC    Staphylococcus epidermidis NOT DETECTED NOT DETECTED Final   Staphylococcus lugdunensis NOT DETECTED NOT DETECTED Final   Streptococcus species NOT DETECTED NOT DETECTED Final   Streptococcus agalactiae NOT  DETECTED NOT DETECTED Final   Streptococcus pneumoniae NOT DETECTED NOT DETECTED Final   Streptococcus pyogenes NOT DETECTED NOT DETECTED Final   A.calcoaceticus-baumannii NOT DETECTED NOT DETECTED Final   Bacteroides fragilis NOT DETECTED NOT DETECTED Final   Enterobacterales NOT DETECTED NOT DETECTED Final   Enterobacter cloacae complex NOT DETECTED NOT DETECTED Final   Escherichia coli NOT DETECTED NOT DETECTED Final   Klebsiella aerogenes NOT DETECTED NOT DETECTED Final   Klebsiella oxytoca NOT DETECTED NOT DETECTED Final   Klebsiella pneumoniae NOT DETECTED NOT DETECTED Final   Proteus species NOT DETECTED NOT DETECTED Final   Salmonella species NOT DETECTED NOT DETECTED Final   Serratia marcescens NOT DETECTED NOT DETECTED Final   Haemophilus influenzae NOT DETECTED NOT  DETECTED Final   Neisseria meningitidis NOT DETECTED NOT DETECTED Final   Pseudomonas aeruginosa NOT DETECTED NOT DETECTED Final   Stenotrophomonas maltophilia NOT DETECTED NOT DETECTED Final   Candida albicans NOT DETECTED NOT DETECTED Final   Candida auris NOT DETECTED NOT DETECTED Final   Candida glabrata NOT DETECTED NOT DETECTED Final   Candida krusei NOT DETECTED NOT DETECTED Final   Candida parapsilosis NOT DETECTED NOT DETECTED Final   Candida tropicalis NOT DETECTED NOT DETECTED Final   Cryptococcus neoformans/gattii NOT DETECTED NOT DETECTED Final   Meth resistant mecA/C and MREJ NOT DETECTED NOT DETECTED Final    Comment: Performed at [facility], [address], [place], [place] [address]  MRSA Next Gen by PCR, Nasal     Status: None   Collection Time: 02/15/23 11:03 AM   Specimen: Nasal Mucosa; Nasal Swab  Result Value Ref Range Status   MRSA by PCR Next Gen NOT DETECTED NOT DETECTED Final    Comment: (NOTE) The GeneXpert MRSA Assay (FDA approved for NASAL specimens only), is one component of a comprehensive MRSA colonization surveillance program. It is not intended to diagnose MRSA infection nor to guide or monitor treatment for MRSA infections. Test performance is not FDA approved in patients less than 67 years old. Performed at [facility], [address], [place], [place] [address]   Culture, blood (Routine X 2) w Reflex to ID Panel     Status: None   Collection Time: 02/17/23  5:42 AM   Specimen: BLOOD  Result Value Ref Range Status   Specimen Description BLOOD LEFT ANTECUBITAL  Final   Special Requests   Final    BOTTLES DRAWN AEROBIC AND ANAEROBIC Blood Culture results may not be optimal due to an inadequate volume of blood received in culture bottles   Culture   Final    NO GROWTH 5 DAYS Performed at [facility], [address], [place], [place] [address]    Report Status 02/22/2023 FINAL  Final  Culture, blood (Routine X 2) w Reflex to ID  Panel     Status: None   Collection Time: 02/17/23  5:43 AM   Specimen: BLOOD LEFT FOREARM  Result Value Ref Range Status   Specimen Description BLOOD LEFT FOREARM  Final   Special Requests   Final    BOTTLES DRAWN AEROBIC AND ANAEROBIC Blood Culture results may not be optimal due to an inadequate volume of blood received in culture bottles   Culture   Final    NO GROWTH 5 DAYS Performed at [facility], [address] [place], [place] [address]    Report Status 02/22/2023 FINAL  Final  Culture, blood (Routine X 2) w Reflex to ID Panel  Status: None   Collection Time: 02/19/23 12:11 PM   Specimen: BLOOD  Result Value Ref Range Status   Specimen Description BLOOD SITE NOT SPECIFIED  Final   Special Requests   Final    BOTTLES DRAWN AEROBIC AND ANAEROBIC Blood Culture results may not be optimal due to an inadequate volume of blood received in culture bottles   Culture   Final    NO GROWTH 5 DAYS Performed at [facility], [address] [place], [place] [address]    Report Status 02/24/2023 FINAL  Final  Culture, blood (Routine X 2) w Reflex to ID Panel     Status: None   Collection Time: 02/19/23 12:30 PM   Specimen: BLOOD LEFT HAND  Result Value Ref Range Status   Specimen Description BLOOD LEFT HAND  Final   Special Requests   Final    BOTTLES DRAWN AEROBIC AND ANAEROBIC Blood Culture results may not be optimal due to an inadequate volume of blood received in culture bottles   Culture   Final    NO GROWTH 5 DAYS Performed at [facility], [address] [place], [place] [address]    Report Status 02/24/2023 FINAL  Final    Labs: CBC: Recent Labs  Lab 02/20/23 0534 02/21/23 0604 02/22/23 0114 02/23/23 0533 02/24/23 0345  WBC 12.7* 10.3 8.1 7.2 7.4  HGB 9.8* 9.1* 9.0* 10.3* 10.0*  HCT 29.6* 27.6* 28.7* 32.3* 32.2*  MCV 109.2* 109.5* 117.6* 115.4* 117.1*  PLT 22* 56* 68* 83* 93*   Basic Metabolic Panel: Recent Labs  Lab 02/18/23 0304  02/19/23 0625 02/20/23 0534 02/22/23 0114 02/24/23 0345  NA 132* 133* 132* 135 137  K 4.3 4.0 4.9 4.4 3.8  CL 104 107 103 102 106  CO2 21* 21* 19* 23 26  GLUCOSE 186* 103* 135* 261* 166*  BUN 26* 24* 30* 20 16  CREATININE 1.15 1.07 0.92 0.80 0.74  CALCIUM 7.7* 7.6* 7.6* 7.8* 7.7*  MG 2.0 1.9  --   --   --    Liver Function Tests: Recent Labs  Lab 02/18/23 0304 02/19/23 0625 02/22/23 0114 02/24/23 0345  AST 70* 64* 32 24  ALT 114* 50* 11 11  ALKPHOS 47 50 53 53  BILITOT 6.3* 6.2* 3.2* 3.2*  PROT 5.0* 4.8* 4.5* 4.6*  ALBUMIN 2.3* 2.2* 2.0* 2.0*   CBG: Recent Labs  Lab 02/23/23 1141 02/23/23 1714 02/23/23 2053 02/24/23 0745 02/24/23 1122  GLUCAP 180* 270* 337* 148* 224*    Discharge time spent: greater than 30 minutes.  Signed: Meredeth Ide, MD [facility] 02/24/2023

## 2023-02-24 NOTE — Progress Notes (Signed)
Arrived to patient room for line care assessment. Notified patient has been DC to facility with PICC line. Tomasita Morrow, RN VAST

## 2023-02-24 NOTE — Progress Notes (Signed)
Per outbound call to [phone] [facility] s/w Gearldine Bienenstock, RN gave transfer report, answered all questions. Roseanne Kaufman my direct number for any further questions.

## 2023-02-24 NOTE — Plan of Care (Signed)

## 2023-02-24 NOTE — TOC Transition Note (Signed)
Transition of Care [facility]) - CM/SW Discharge Note   Patient Details  Name: David Joseph MRN: [id number] Date of Birth: 30-Mar-1940  [facility] CM/SW Contact:  Carley Hammed, LCSW Phone Number: 02/24/2023, 10:19 AM   Clinical Narrative:     Pt to be transported to [facility] via [facility]. Nurse to call report to [phone]. Rm# [phone]  Final next level of care: Skilled Nursing Facility Barriers to Discharge: Barriers Resolved   Patient Goals and CMS Choice      Discharge Placement                Patient chooses bed at: [facility] Patient to be transferred to facility by: [facility] Name of family member notified: Spouse Patient and family notified of of transfer: 02/24/23  Discharge Plan and Services Additional resources added to the After Visit Summary for     Discharge Planning Services: CM Consult Post Acute Care Choice:  (await PT eval)          DME Arranged:  (await PT eval)         HH Arranged:  (see note)          Social Determinants of Health (SDOH) Interventions SDOH Screenings   Food Insecurity: No Food Insecurity (02/15/2023)  Housing: Low Risk  (02/15/2023)  Transportation Needs: No Transportation Needs (02/15/2023)  Utilities: Not At Risk (02/15/2023)  Alcohol Screen: Low Risk  (10/15/2021)  Depression (PHQ2-9): Low Risk  (10/15/2021)  Financial Resource Strain: Low Risk  (05/20/2022)   Received from [facility], [facility]  Physical Activity: Inactive (10/24/2022)   Received from [facility]  Social Connections: Socially Integrated (10/15/2021)  Stress: No Stress Concern Present (10/24/2022)   Received from [facility]  Tobacco Use: Medium Risk (02/18/2023)  Health Literacy: Medium Risk (07/29/2022)   Received from [facility]     Readmission Risk Interventions     No data to display

## 2023-02-24 NOTE — Progress Notes (Signed)
Pt's scrotum is swollen, skin is dry and flaky. He complained of irritation. Changed primo, provided peri care and applied barrier cream to irritated area. Will continue to monitor. Pt states "it feels so much better."

## 2023-02-24 NOTE — Plan of Care (Signed)
Problem: Education: Goal: Knowledge of General Education information will improve Description: Including pain rating scale, medication(s)/side effects and non-pharmacologic comfort measures 02/24/2023 1846 by Loetta Rough, LPN Outcome: Adequate for Discharge 02/24/2023 1231 by Loetta Rough, LPN Outcome: Adequate for Discharge   Problem: Health Behavior/Discharge Planning: Goal: Ability to manage health-related needs will improve 02/24/2023 1846 by Loetta Rough, LPN Outcome: Adequate for Discharge 02/24/2023 1231 by Loetta Rough, LPN Outcome: Adequate for Discharge   Problem: Clinical Measurements: Goal: Ability to maintain clinical measurements within normal limits will improve 02/24/2023 1846 by Loetta Rough, LPN Outcome: Adequate for Discharge 02/24/2023 1231 by Loetta Rough, LPN Outcome: Adequate for Discharge Goal: Will remain free from infection 02/24/2023 1846 by Loetta Rough, LPN Outcome: Adequate for Discharge 02/24/2023 1231 by Loetta Rough, LPN Outcome: Adequate for Discharge Goal: Diagnostic test results will improve 02/24/2023 1846 by Loetta Rough, LPN Outcome: Adequate for Discharge 02/24/2023 1231 by Loetta Rough, LPN Outcome: Adequate for Discharge Goal: Respiratory complications will improve 02/24/2023 1846 by Loetta Rough, LPN Outcome: Adequate for Discharge 02/24/2023 1231 by Loetta Rough, LPN Outcome: Adequate for Discharge Goal: Cardiovascular complication will be avoided 02/24/2023 1846 by Loetta Rough, LPN Outcome: Adequate for Discharge 02/24/2023 1231 by Loetta Rough, LPN Outcome: Adequate for Discharge   Problem: Activity: Goal: Risk for activity intolerance will decrease 02/24/2023 1846 by Loetta Rough, LPN Outcome: Adequate for Discharge 02/24/2023 1231 by Loetta Rough, LPN Outcome: Adequate for Discharge   Problem: Nutrition: Goal: Adequate nutrition will be maintained 02/24/2023 1846 by  Loetta Rough, LPN Outcome: Adequate for Discharge 02/24/2023 1231 by Loetta Rough, LPN Outcome: Adequate for Discharge   Problem: Coping: Goal: Level of anxiety will decrease 02/24/2023 1846 by Loetta Rough, LPN Outcome: Adequate for Discharge 02/24/2023 1231 by Loetta Rough, LPN Outcome: Adequate for Discharge   Problem: Elimination: Goal: Will not experience complications related to bowel motility 02/24/2023 1846 by Loetta Rough, LPN Outcome: Adequate for Discharge 02/24/2023 1231 by Loetta Rough, LPN Outcome: Adequate for Discharge Goal: Will not experience complications related to urinary retention 02/24/2023 1846 by Loetta Rough, LPN Outcome: Adequate for Discharge 02/24/2023 1231 by Loetta Rough, LPN Outcome: Adequate for Discharge   Problem: Pain Management: Goal: General experience of comfort will improve 02/24/2023 1846 by Loetta Rough, LPN Outcome: Adequate for Discharge 02/24/2023 1231 by Loetta Rough, LPN Outcome: Adequate for Discharge   Problem: Safety: Goal: Ability to remain free from injury will improve 02/24/2023 1846 by Loetta Rough, LPN Outcome: Adequate for Discharge 02/24/2023 1231 by Loetta Rough, LPN Outcome: Adequate for Discharge   Problem: Skin Integrity: Goal: Risk for impaired skin integrity will decrease 02/24/2023 1846 by Loetta Rough, LPN Outcome: Adequate for Discharge 02/24/2023 1231 by Loetta Rough, LPN Outcome: Adequate for Discharge   Problem: Education: Goal: Ability to describe self-care measures that may prevent or decrease complications (Diabetes Survival Skills Education) will improve 02/24/2023 1846 by Loetta Rough, LPN Outcome: Adequate for Discharge 02/24/2023 1231 by Loetta Rough, LPN Outcome: Adequate for Discharge Goal: Individualized Educational Video(s) 02/24/2023 1846 by Loetta Rough, LPN Outcome: Adequate for Discharge 02/24/2023 1231 by Loetta Rough,  LPN Outcome: Adequate for Discharge   Problem: Coping: Goal: Ability to adjust to condition or change in health will improve 02/24/2023 1846 by Loetta Rough, LPN Outcome: Adequate for Discharge 02/24/2023 1231 by Loetta Rough, LPN Outcome: Adequate for Discharge   Problem: Fluid Volume: Goal: Ability to maintain a balanced intake and output will improve 02/24/2023 1846 by Loetta Rough, LPN Outcome: Adequate for Discharge 02/24/2023 1231 by Loetta Rough,  LPN Outcome: Adequate for Discharge   Problem: Health Behavior/Discharge Planning: Goal: Ability to identify and utilize available resources and services will improve 02/24/2023 1846 by Loetta Rough, LPN Outcome: Adequate for Discharge 02/24/2023 1231 by Loetta Rough, LPN Outcome: Adequate for Discharge Goal: Ability to manage health-related needs will improve 02/24/2023 1846 by Loetta Rough, LPN Outcome: Adequate for Discharge 02/24/2023 1231 by Loetta Rough, LPN Outcome: Adequate for Discharge   Problem: Metabolic: Goal: Ability to maintain appropriate glucose levels will improve 02/24/2023 1846 by Loetta Rough, LPN Outcome: Adequate for Discharge 02/24/2023 1231 by Loetta Rough, LPN Outcome: Adequate for Discharge   Problem: Nutritional: Goal: Maintenance of adequate nutrition will improve 02/24/2023 1846 by Loetta Rough, LPN Outcome: Adequate for Discharge 02/24/2023 1231 by Loetta Rough, LPN Outcome: Adequate for Discharge Goal: Progress toward achieving an optimal weight will improve 02/24/2023 1846 by Loetta Rough, LPN Outcome: Adequate for Discharge 02/24/2023 1231 by Loetta Rough, LPN Outcome: Adequate for Discharge   Problem: Skin Integrity: Goal: Risk for impaired skin integrity will decrease 02/24/2023 1846 by Loetta Rough, LPN Outcome: Adequate for Discharge 02/24/2023 1231 by Loetta Rough, LPN Outcome: Adequate for Discharge   Problem: Tissue  Perfusion: Goal: Adequacy of tissue perfusion will improve 02/24/2023 1846 by Loetta Rough, LPN Outcome: Adequate for Discharge 02/24/2023 1231 by Loetta Rough, LPN Outcome: Adequate for Discharge

## 2023-02-24 NOTE — Progress Notes (Signed)
Physical Therapy Treatment Patient Details Name: David Joseph MRN: [id number] DOB: 10-13-1940 Today's Date: 02/24/2023   History of Present Illness 82 y.o. male admitted 02/15/23 with generalized weakness and abdominal pain. Workup for possible diverticulitis, L residual limb infection with MSSA bacteremia, staph auerus, acute metabolic acidosis. Port-a-cath removed 12/3. TEE cancelled due to low platelet count. PMH includes bilateral BKA, CHF, PVD, chronic inguinal hernias, DM2, CVA, ILD, afib, DVT.   PT Comments  Pt progressing with mobility. Today's session focused on sitting EOB tasks; pt still having difficulty with scooting transfers, requiring maxA+2 for lateral scoot to recliner. Pt remains limited by generalized weakness, decreased activity tolerance, and impaired balance strategies/postural reactions. Continue to recommend SNF-level therapies to maximize functional mobility and independence prior to return home.     If plan is discharge home, recommend the following: A lot of help with bathing/dressing/bathroom;Two people to help with walking and/or transfers;Assistance with cooking/housework;Help with stairs or ramp for entrance;Assist for transportation   Can travel by private vehicle     No  Equipment Recommendations  None recommended by PT    Recommendations for Other Services       Precautions / Restrictions Precautions Precautions: Fall;Other (comment) Precaution Comments: scrotal edema; h/o bilateral BKA Restrictions Weight Bearing Restrictions: No     Mobility  Bed Mobility Overal bed mobility: Needs Assistance Bed Mobility: Rolling, Sidelying to Sit Rolling: Min assist, Used rails Sidelying to sit: Mod assist, HOB elevated, Used rails       General bed mobility comments: pt attempting to sit on L-side bed by pulling on R-side rail and powering up to long sitting, unsuccessful. cues for sequencing, pt able to roll towards L with BUE rail support and minA,  modA for trunk elevation and scooting hips to EOB    Transfers Overall transfer level: Needs assistance   Transfers: Bed to chair/wheelchair/BSC            Lateral/Scoot Transfers: Max assist, +2 physical assistance General transfer comment: maxA to scoot along EOB with use of bed pad, pt assisting with LUE rail support; maxA+2 for lateral scoot from bed to drop-arm recliner with use of bed pad    Ambulation/Gait                   Stairs             Wheelchair Mobility     Tilt Bed    Modified Rankin (Stroke Patients Only)       Balance Overall balance assessment: Needs assistance   Sitting balance-Leahy Scale: Fair Sitting balance - Comments: prolonged static sitting EOB with improving core control, able to prop on R elbow then return to midline, standby assist for balance                                    Cognition Arousal: Alert Behavior During Therapy: WFL for tasks assessed/performed Overall Cognitive Status: History of cognitive impairments - at baseline                                 General Comments: improved cognition; pleasant and cooperative, following simple commands; increased time problem solving with intermittent cues for best ways to attempt mobility. limited by [facility]        Exercises General Exercises - Lower Extremity Long Arc Quad: AROM, Both, 10 reps,  Seated    General Comments General comments (skin integrity, edema, etc.): pt's wife present and supportive. pt endorses limited by scrotal edema with mobility but very happy to be out of bed to chair; will limit time in recliner due to buttocks/sacral discomfort      Pertinent Vitals/Pain Pain Assessment Pain Assessment: Faces Faces Pain Scale: Hurts little more Pain Location: scrotum Pain Descriptors / Indicators: Discomfort, Grimacing, Guarding Pain Intervention(s): Monitored during session, Limited activity within patient's tolerance,  Repositioned    Home Living                          Prior Function            PT Goals (current goals can now be found in the care plan section) Progress towards PT goals: Progressing toward goals    Frequency    Min 1X/week      PT Plan      Co-evaluation              AM-PAC PT "6 Clicks" Mobility   Outcome Measure  Help needed turning from your back to your side while in a flat bed without using bedrails?: A Lot Help needed moving from lying on your back to sitting on the side of a flat bed without using bedrails?: A Lot Help needed moving to and from a bed to a chair (including a wheelchair)?: Total Help needed standing up from a chair using your arms (e.g., wheelchair or bedside chair)?: Total Help needed to walk in hospital room?: Total Help needed climbing 3-5 steps with a railing? : Total 6 Click Score: 8    End of Session   Activity Tolerance: Patient tolerated treatment well Patient left: in chair;with call bell/phone within reach;with chair alarm set;with family/visitor present Nurse Communication: Mobility status PT Visit Diagnosis: Other abnormalities of gait and mobility (R26.89)     Time: 0347-4259 PT Time Calculation (min) (ACUTE ONLY): 25 min  Charges:    $Therapeutic Activity: 23-37 mins PT General Charges $$ ACUTE PT VISIT: 1 Visit                      Ina Homes, PT, DPT Acute Rehabilitation Services  Personal: Secure Chat Rehab Office: [phone]  Malachy Chamber 02/24/2023, 11:08 AM

## 2023-02-24 NOTE — Progress Notes (Signed)
Physical Therapy Treatment Patient Details Name: David Joseph MRN: [id number] DOB: 02/16/1941 Today's Date: 02/24/2023   History of Present Illness 82 y.o. male admitted 02/15/23 with generalized weakness and abdominal pain. Workup for possible diverticulitis, L residual limb infection with MSSA bacteremia, staph auerus, acute metabolic acidosis. Port-a-cath removed 12/3. TEE cancelled due to low platelet count. PMH includes bilateral BKA, CHF, PVD, chronic inguinal hernias, DM2, CVA, ILD, afib, DVT.   PT Comments  Pt seen for additional session, including transfer from recliner to bed with maxA+2 for lateral scooting. Pt endorses increased sacral/buttocks discomfort after sitting in recliner for ~75 min. Once supine, pt mod indep rolling onto R side for pressure relief to buttocks; educ on pressure relief strategies and importance of doing so throughout day. Per chart, pt preparing for d/c to SNF rehab today. If to remain admitted, will continue to follow acutely to address established goals.    If plan is discharge home, recommend the following: A lot of help with bathing/dressing/bathroom;Two people to help with walking and/or transfers;Assistance with cooking/housework;Help with stairs or ramp for entrance;Assist for transportation   Can travel by private vehicle     No  Equipment Recommendations  None recommended by PT    Recommendations for Other Services       Precautions / Restrictions Precautions Precautions: Fall;Other (comment) Precaution Comments: scrotal edema; buttock/sacral pain with prolonged sitting; h/o bilateral BKA Restrictions Weight Bearing Restrictions: No     Mobility  Bed Mobility Overal bed mobility: Needs Assistance Bed Mobility: Sit to Supine, Rolling Rolling: Modified independent (Device/Increase time), Used rails Sidelying to sit: Mod assist, HOB elevated, Used rails   Sit to supine: Max assist   General bed mobility comments: cues for sequencing  for return to supine, pt with difficulty scooting hips backwards at edge of bed requiring mod-maxA for BLE management return to supine; pt able to roll self to R-side mod indep with bed rail    Transfers Overall transfer level: Needs assistance   Transfers: Bed to chair/wheelchair/BSC            Lateral/Scoot Transfers: Max assist, +2 physical assistance General transfer comment: maxA+2 for scoot from drop arm recliner to bed with use of bed pad, pt able to assist with LUE holding onto bed rail to help pull; pt attributes limited ability to help due to scrotal pain/swelling    Ambulation/Gait                   Stairs             Wheelchair Mobility     Tilt Bed    Modified Rankin (Stroke Patients Only)       Balance Overall balance assessment: Needs assistance Sitting-balance support: No upper extremity supported, Feet unsupported, Single extremity supported Sitting balance-Leahy Scale: Fair Sitting balance - Comments: pt able to perform multiple anterior weight shifts from recliner back without UE support                                    Cognition Arousal: Alert Behavior During Therapy: WFL for tasks assessed/performed Overall Cognitive Status: History of cognitive impairments - at baseline                                 General Comments: pleasant and cooperative, following simple commands; increased time problem solving  with intermittent cues for best ways to attempt mobility. limited by [facility]        Exercises General Exercises - Lower Extremity Long Arc Quad: AROM, Both, 10 reps, Seated    General Comments General comments (skin integrity, edema, etc.): pt asking if he can remain sidelying to offload painful buttocks/sacrum - educ on importance of rolling to boths sides frequently throughout day for pressure relief, pt mod indep to roll with bed rails when bed flat; pillow placed for trunk support      Pertinent  Vitals/Pain Pain Assessment Pain Assessment: Faces Faces Pain Scale: Hurts little more Pain Location: buttocks from sitting, scrotum Pain Descriptors / Indicators: Discomfort, Grimacing, Guarding Pain Intervention(s): Monitored during session, Limited activity within patient's tolerance, Repositioned    Home Living                          Prior Function            PT Goals (current goals can now be found in the care plan section) Progress towards PT goals: Progressing toward goals    Frequency    Min 1X/week      PT Plan      Co-evaluation              AM-PAC PT "6 Clicks" Mobility   Outcome Measure  Help needed turning from your back to your side while in a flat bed without using bedrails?: A Lot Help needed moving from lying on your back to sitting on the side of a flat bed without using bedrails?: A Lot Help needed moving to and from a bed to a chair (including a wheelchair)?: Total Help needed standing up from a chair using your arms (e.g., wheelchair or bedside chair)?: Total Help needed to walk in hospital room?: Total Help needed climbing 3-5 steps with a railing? : Total 6 Click Score: 8    End of Session   Activity Tolerance: Patient tolerated treatment well;Patient limited by pain Patient left: in bed;with call bell/phone within reach;with bed alarm set Nurse Communication: Mobility status;Need for lift equipment PT Visit Diagnosis: Other abnormalities of gait and mobility (R26.89)     Time: 5284-1324 PT Time Calculation (min) (ACUTE ONLY): 14 min  Charges:    $Therapeutic Activity: 8-22 mins PT General Charges $$ ACUTE PT VISIT: 1 Visit                      Ina Homes, PT, DPT Acute Rehabilitation Services  Personal: Secure Chat Rehab Office: [phone]  Malachy Chamber 02/24/2023, 1:28 PM

## 2023-02-24 NOTE — Progress Notes (Signed)
Patient discharge in good condition

## 2023-02-24 NOTE — Progress Notes (Signed)
Initial Nutrition Assessment  DOCUMENTATION CODES:   Not applicable  INTERVENTION:   - Liberalize diet to Promote PO intake   If patient does not discharge, recommend:   - Ensure Enlive BID, each supplement provides 350 kcal and 20 grams of protein.  - Prosource Plus BID, each supplement provides 100 kcal and 15 gm protein   - Magic cup TID with meals, each supplement provides 290 kcal and 9 grams of protein   NUTRITION DIAGNOSIS:   Inadequate oral intake related to acute illness as evidenced by meal completion < 50%.   GOAL:   Patient will meet greater than or equal to 90% of their needs   MONITOR:   PO intake  REASON FOR ASSESSMENT:   Consult Assessment of nutrition requirement/status  ASSESSMENT:  82 y.o M with PMH of diverticulitis, anemia, DVT, Prostate cancer, CPPD, Ramsay Hunt syndrome, myelodysplastic syndrome, T2DM, CVA, HFrEFRA, interstitial lung disease  bilateral BKA, Afib, PAD. [facility] from [facility] with complaints of weakness and lower abdominal pain. Admitted for evaluation of cholecystitis and possible diverticulitis.  - GI not convinced pt has diverticulitis - General surgery not convinced pt has acute cholecystitis  History obtained from EHR. PTA pt had very poor appetite along with N/V that began 11/28.  Per documented meal history pt was eating 100% of his meals until yesterday where his meal intake dropped to 75%. Today, no breakfast was documented and lunch and dinner ranged from 25-50% consumed.   Weight has been trending up since January. Pt has had swelling in lower extremities, documented yesterday. Pt to be D/C today, NFPE deferred.   Admit weight: 76.8 kg Current weight: 77.4 kg    02/24/23 77.4 kg  04/08/22 60.5 kg   Average Meal Intake: 12/5-12/8: 85% intake x 8 recorded meals  Nutritionally Relevant Medications: Scheduled Meds:  apixaban  5 mg Oral BID   ascorbic acid  500 mg Oral Daily   Chlorhexidine Gluconate Cloth   6 each Topical Daily   cholecalciferol  1,000 Units Oral Daily   hydroxychloroquine  200 mg Oral Daily   insulin aspart  0-15 Units Subcutaneous TID WC   insulin aspart  0-5 Units Subcutaneous QHS   insulin glargine-yfgn  10 Units Subcutaneous Daily   metoprolol succinate  12.5 mg Oral BID   pantoprazole  40 mg Oral Daily   predniSONE  15 mg Oral Daily   pregabalin  100 mg Oral TID   valACYclovir  500 mg Oral Daily   Continuous Infusions:   ceFAZolin (ANCEF) IV Stopped (02/24/23 1417)   Labs Reviewed: Calcium 7.7, Phosphorus 4.7 (11/30)  CBG ranges from 157-337 mg/dL over the last 24 hours HgbA1c 5.6  Diet Order:   Diet Order             Diet regular Room service appropriate? Yes; Fluid consistency: Thin  Diet effective now           Diet - low sodium heart healthy                   EDUCATION NEEDS:   No education needs have been identified at this time  Skin:  Skin Assessment: Skin Integrity Issues: Skin Integrity Issues:: Stage I, Other (Comment), Incisions Stage I: Coccyx Incisions: Closed chest Other: 41 month old non pressure injury wound to knee from leg brace  Last BM:  02/23/2023, type 1, small  Height:   Ht Readings from Last 1 Encounters:  02/15/23 5\' 8"  (1.727 m)  Weight:   Wt Readings from Last 1 Encounters:  02/24/23 77.4 kg     BMI:  Body mass index is 25.95 kg/m.  Estimated Nutritional Needs:   Kcal:  2100-2300 kcal  Protein:  110-125 gm  Fluid:  >2L   Elliot Dally, RD Registered Dietitian  See Amion for more information

## 2023-02-25 DIAGNOSIS — C7989 Secondary malignant neoplasm of other specified sites: Secondary | ICD-10-CM | POA: Diagnosis not present

## 2023-02-25 DIAGNOSIS — K819 Cholecystitis, unspecified: Secondary | ICD-10-CM | POA: Diagnosis not present

## 2023-02-25 DIAGNOSIS — M069 Rheumatoid arthritis, unspecified: Secondary | ICD-10-CM | POA: Diagnosis not present

## 2023-02-25 DIAGNOSIS — I4819 Other persistent atrial fibrillation: Secondary | ICD-10-CM | POA: Diagnosis not present

## 2023-02-25 DIAGNOSIS — J849 Interstitial pulmonary disease, unspecified: Secondary | ICD-10-CM | POA: Diagnosis not present

## 2023-02-25 DIAGNOSIS — I679 Cerebrovascular disease, unspecified: Secondary | ICD-10-CM | POA: Diagnosis not present

## 2023-02-25 DIAGNOSIS — C4492 Squamous cell carcinoma of skin, unspecified: Secondary | ICD-10-CM | POA: Diagnosis not present

## 2023-02-25 DIAGNOSIS — L97129 Non-pressure chronic ulcer of left thigh with unspecified severity: Secondary | ICD-10-CM | POA: Diagnosis not present

## 2023-02-25 DIAGNOSIS — Z89511 Acquired absence of right leg below knee: Secondary | ICD-10-CM | POA: Diagnosis not present

## 2023-02-25 DIAGNOSIS — E1151 Type 2 diabetes mellitus with diabetic peripheral angiopathy without gangrene: Secondary | ICD-10-CM | POA: Diagnosis not present

## 2023-02-25 DIAGNOSIS — E118 Type 2 diabetes mellitus with unspecified complications: Secondary | ICD-10-CM | POA: Diagnosis not present

## 2023-02-25 DIAGNOSIS — I5020 Unspecified systolic (congestive) heart failure: Secondary | ICD-10-CM | POA: Diagnosis not present

## 2023-02-25 DIAGNOSIS — E11622 Type 2 diabetes mellitus with other skin ulcer: Secondary | ICD-10-CM | POA: Diagnosis not present

## 2023-02-25 DIAGNOSIS — E1142 Type 2 diabetes mellitus with diabetic polyneuropathy: Secondary | ICD-10-CM | POA: Diagnosis not present

## 2023-02-25 DIAGNOSIS — D469 Myelodysplastic syndrome, unspecified: Secondary | ICD-10-CM | POA: Diagnosis not present

## 2023-02-26 DIAGNOSIS — L97822 Non-pressure chronic ulcer of other part of left lower leg with fat layer exposed: Secondary | ICD-10-CM | POA: Diagnosis not present

## 2023-02-28 DIAGNOSIS — D693 Immune thrombocytopenic purpura: Secondary | ICD-10-CM | POA: Diagnosis not present

## 2023-02-28 DIAGNOSIS — I5020 Unspecified systolic (congestive) heart failure: Secondary | ICD-10-CM | POA: Diagnosis not present

## 2023-02-28 DIAGNOSIS — R7989 Other specified abnormal findings of blood chemistry: Secondary | ICD-10-CM | POA: Diagnosis not present

## 2023-03-03 DIAGNOSIS — M5020 Other cervical disc displacement, unspecified cervical region: Secondary | ICD-10-CM | POA: Diagnosis not present

## 2023-03-03 DIAGNOSIS — D469 Myelodysplastic syndrome, unspecified: Secondary | ICD-10-CM | POA: Diagnosis not present

## 2023-03-03 DIAGNOSIS — Z7901 Long term (current) use of anticoagulants: Secondary | ICD-10-CM | POA: Diagnosis not present

## 2023-03-03 DIAGNOSIS — E11622 Type 2 diabetes mellitus with other skin ulcer: Secondary | ICD-10-CM | POA: Diagnosis not present

## 2023-03-03 DIAGNOSIS — M1711 Unilateral primary osteoarthritis, right knee: Secondary | ICD-10-CM | POA: Diagnosis not present

## 2023-03-03 DIAGNOSIS — Z7982 Long term (current) use of aspirin: Secondary | ICD-10-CM | POA: Diagnosis not present

## 2023-03-03 DIAGNOSIS — A419 Sepsis, unspecified organism: Secondary | ICD-10-CM | POA: Diagnosis not present

## 2023-03-03 DIAGNOSIS — Z8673 Personal history of transient ischemic attack (TIA), and cerebral infarction without residual deficits: Secondary | ICD-10-CM | POA: Diagnosis not present

## 2023-03-03 DIAGNOSIS — Z7952 Long term (current) use of systemic steroids: Secondary | ICD-10-CM | POA: Diagnosis not present

## 2023-03-03 DIAGNOSIS — I4819 Other persistent atrial fibrillation: Secondary | ICD-10-CM | POA: Diagnosis not present

## 2023-03-03 DIAGNOSIS — D630 Anemia in neoplastic disease: Secondary | ICD-10-CM | POA: Diagnosis not present

## 2023-03-03 DIAGNOSIS — L97129 Non-pressure chronic ulcer of left thigh with unspecified severity: Secondary | ICD-10-CM | POA: Diagnosis not present

## 2023-03-03 DIAGNOSIS — I5020 Unspecified systolic (congestive) heart failure: Secondary | ICD-10-CM | POA: Diagnosis not present

## 2023-03-03 DIAGNOSIS — Z7984 Long term (current) use of oral hypoglycemic drugs: Secondary | ICD-10-CM | POA: Diagnosis not present

## 2023-03-03 DIAGNOSIS — C7989 Secondary malignant neoplasm of other specified sites: Secondary | ICD-10-CM | POA: Diagnosis not present

## 2023-03-03 DIAGNOSIS — E1151 Type 2 diabetes mellitus with diabetic peripheral angiopathy without gangrene: Secondary | ICD-10-CM | POA: Diagnosis not present

## 2023-03-03 DIAGNOSIS — C4492 Squamous cell carcinoma of skin, unspecified: Secondary | ICD-10-CM | POA: Diagnosis not present

## 2023-03-03 DIAGNOSIS — Z993 Dependence on wheelchair: Secondary | ICD-10-CM | POA: Diagnosis not present

## 2023-03-03 DIAGNOSIS — E1142 Type 2 diabetes mellitus with diabetic polyneuropathy: Secondary | ICD-10-CM | POA: Diagnosis not present

## 2023-03-03 DIAGNOSIS — Z8546 Personal history of malignant neoplasm of prostate: Secondary | ICD-10-CM | POA: Diagnosis not present

## 2023-03-03 DIAGNOSIS — Z87891 Personal history of nicotine dependence: Secondary | ICD-10-CM | POA: Diagnosis not present

## 2023-03-03 DIAGNOSIS — D472 Monoclonal gammopathy: Secondary | ICD-10-CM | POA: Diagnosis not present

## 2023-03-03 DIAGNOSIS — T8744 Infection of amputation stump, left lower extremity: Secondary | ICD-10-CM | POA: Diagnosis not present

## 2023-03-03 DIAGNOSIS — Z89511 Acquired absence of right leg below knee: Secondary | ICD-10-CM | POA: Diagnosis not present

## 2023-03-03 DIAGNOSIS — R5381 Other malaise: Secondary | ICD-10-CM | POA: Diagnosis not present

## 2023-03-03 DIAGNOSIS — Z89512 Acquired absence of left leg below knee: Secondary | ICD-10-CM | POA: Diagnosis not present

## 2023-03-05 DIAGNOSIS — J849 Interstitial pulmonary disease, unspecified: Secondary | ICD-10-CM | POA: Diagnosis not present

## 2023-03-05 DIAGNOSIS — M1120 Other chondrocalcinosis, unspecified site: Secondary | ICD-10-CM | POA: Diagnosis not present

## 2023-03-05 DIAGNOSIS — K219 Gastro-esophageal reflux disease without esophagitis: Secondary | ICD-10-CM | POA: Diagnosis not present

## 2023-03-05 DIAGNOSIS — S81811A Laceration without foreign body, right lower leg, initial encounter: Secondary | ICD-10-CM | POA: Diagnosis not present

## 2023-03-05 DIAGNOSIS — Z89511 Acquired absence of right leg below knee: Secondary | ICD-10-CM | POA: Diagnosis not present

## 2023-03-05 DIAGNOSIS — E1142 Type 2 diabetes mellitus with diabetic polyneuropathy: Secondary | ICD-10-CM | POA: Diagnosis not present

## 2023-03-05 DIAGNOSIS — I4819 Other persistent atrial fibrillation: Secondary | ICD-10-CM | POA: Diagnosis not present

## 2023-03-05 DIAGNOSIS — M069 Rheumatoid arthritis, unspecified: Secondary | ICD-10-CM | POA: Diagnosis not present

## 2023-03-05 DIAGNOSIS — B0221 Postherpetic geniculate ganglionitis: Secondary | ICD-10-CM | POA: Diagnosis not present

## 2023-03-05 DIAGNOSIS — L97129 Non-pressure chronic ulcer of left thigh with unspecified severity: Secondary | ICD-10-CM | POA: Diagnosis not present

## 2023-03-05 DIAGNOSIS — I4811 Longstanding persistent atrial fibrillation: Secondary | ICD-10-CM | POA: Diagnosis not present

## 2023-03-05 DIAGNOSIS — E1151 Type 2 diabetes mellitus with diabetic peripheral angiopathy without gangrene: Secondary | ICD-10-CM | POA: Diagnosis not present

## 2023-03-05 DIAGNOSIS — D469 Myelodysplastic syndrome, unspecified: Secondary | ICD-10-CM | POA: Diagnosis not present

## 2023-03-05 DIAGNOSIS — I5020 Unspecified systolic (congestive) heart failure: Secondary | ICD-10-CM | POA: Diagnosis not present

## 2023-03-05 DIAGNOSIS — K819 Cholecystitis, unspecified: Secondary | ICD-10-CM | POA: Diagnosis not present

## 2023-03-05 DIAGNOSIS — I679 Cerebrovascular disease, unspecified: Secondary | ICD-10-CM | POA: Diagnosis not present

## 2023-03-05 DIAGNOSIS — E11622 Type 2 diabetes mellitus with other skin ulcer: Secondary | ICD-10-CM | POA: Diagnosis not present

## 2023-03-05 DIAGNOSIS — E118 Type 2 diabetes mellitus with unspecified complications: Secondary | ICD-10-CM | POA: Diagnosis not present

## 2023-03-05 DIAGNOSIS — K2100 Gastro-esophageal reflux disease with esophagitis, without bleeding: Secondary | ICD-10-CM | POA: Diagnosis not present

## 2023-03-07 DIAGNOSIS — E1151 Type 2 diabetes mellitus with diabetic peripheral angiopathy without gangrene: Secondary | ICD-10-CM | POA: Diagnosis not present

## 2023-03-07 DIAGNOSIS — L97129 Non-pressure chronic ulcer of left thigh with unspecified severity: Secondary | ICD-10-CM | POA: Diagnosis not present

## 2023-03-07 DIAGNOSIS — I4819 Other persistent atrial fibrillation: Secondary | ICD-10-CM | POA: Diagnosis not present

## 2023-03-07 DIAGNOSIS — D469 Myelodysplastic syndrome, unspecified: Secondary | ICD-10-CM | POA: Diagnosis not present

## 2023-03-07 DIAGNOSIS — E1142 Type 2 diabetes mellitus with diabetic polyneuropathy: Secondary | ICD-10-CM | POA: Diagnosis not present

## 2023-03-07 DIAGNOSIS — E11622 Type 2 diabetes mellitus with other skin ulcer: Secondary | ICD-10-CM | POA: Diagnosis not present

## 2023-03-10 DIAGNOSIS — D469 Myelodysplastic syndrome, unspecified: Secondary | ICD-10-CM | POA: Diagnosis not present

## 2023-03-10 DIAGNOSIS — L97129 Non-pressure chronic ulcer of left thigh with unspecified severity: Secondary | ICD-10-CM | POA: Diagnosis not present

## 2023-03-10 DIAGNOSIS — E1151 Type 2 diabetes mellitus with diabetic peripheral angiopathy without gangrene: Secondary | ICD-10-CM | POA: Diagnosis not present

## 2023-03-10 DIAGNOSIS — I4819 Other persistent atrial fibrillation: Secondary | ICD-10-CM | POA: Diagnosis not present

## 2023-03-10 DIAGNOSIS — E1142 Type 2 diabetes mellitus with diabetic polyneuropathy: Secondary | ICD-10-CM | POA: Diagnosis not present

## 2023-03-10 DIAGNOSIS — E11622 Type 2 diabetes mellitus with other skin ulcer: Secondary | ICD-10-CM | POA: Diagnosis not present

## 2023-03-14 DIAGNOSIS — D469 Myelodysplastic syndrome, unspecified: Secondary | ICD-10-CM | POA: Diagnosis not present

## 2023-03-14 DIAGNOSIS — L97129 Non-pressure chronic ulcer of left thigh with unspecified severity: Secondary | ICD-10-CM | POA: Diagnosis not present

## 2023-03-14 DIAGNOSIS — I4819 Other persistent atrial fibrillation: Secondary | ICD-10-CM | POA: Diagnosis not present

## 2023-03-14 DIAGNOSIS — E11622 Type 2 diabetes mellitus with other skin ulcer: Secondary | ICD-10-CM | POA: Diagnosis not present

## 2023-03-14 DIAGNOSIS — E063 Autoimmune thyroiditis: Secondary | ICD-10-CM | POA: Diagnosis not present

## 2023-03-14 DIAGNOSIS — C44320 Squamous cell carcinoma of skin of unspecified parts of face: Secondary | ICD-10-CM | POA: Diagnosis not present

## 2023-03-14 DIAGNOSIS — E1151 Type 2 diabetes mellitus with diabetic peripheral angiopathy without gangrene: Secondary | ICD-10-CM | POA: Diagnosis not present

## 2023-03-14 DIAGNOSIS — E1142 Type 2 diabetes mellitus with diabetic polyneuropathy: Secondary | ICD-10-CM | POA: Diagnosis not present

## 2023-03-17 DIAGNOSIS — I4819 Other persistent atrial fibrillation: Secondary | ICD-10-CM | POA: Diagnosis not present

## 2023-03-17 DIAGNOSIS — E11622 Type 2 diabetes mellitus with other skin ulcer: Secondary | ICD-10-CM | POA: Diagnosis not present

## 2023-03-17 DIAGNOSIS — D469 Myelodysplastic syndrome, unspecified: Secondary | ICD-10-CM | POA: Diagnosis not present

## 2023-03-17 DIAGNOSIS — I4811 Longstanding persistent atrial fibrillation: Secondary | ICD-10-CM | POA: Diagnosis not present

## 2023-03-17 DIAGNOSIS — E1142 Type 2 diabetes mellitus with diabetic polyneuropathy: Secondary | ICD-10-CM | POA: Diagnosis not present

## 2023-03-17 DIAGNOSIS — T380X5A Adverse effect of glucocorticoids and synthetic analogues, initial encounter: Secondary | ICD-10-CM | POA: Diagnosis not present

## 2023-03-17 DIAGNOSIS — M818 Other osteoporosis without current pathological fracture: Secondary | ICD-10-CM | POA: Diagnosis not present

## 2023-03-17 DIAGNOSIS — E1151 Type 2 diabetes mellitus with diabetic peripheral angiopathy without gangrene: Secondary | ICD-10-CM | POA: Diagnosis not present

## 2023-03-17 DIAGNOSIS — L97129 Non-pressure chronic ulcer of left thigh with unspecified severity: Secondary | ICD-10-CM | POA: Diagnosis not present

## 2023-03-20 ENCOUNTER — Ambulatory Visit (INDEPENDENT_AMBULATORY_CARE_PROVIDER_SITE_OTHER): Payer: Medicare Other | Admitting: Internal Medicine

## 2023-03-20 ENCOUNTER — Telehealth: Payer: Self-pay

## 2023-03-20 ENCOUNTER — Other Ambulatory Visit: Payer: Self-pay

## 2023-03-20 ENCOUNTER — Encounter: Payer: Self-pay | Admitting: Internal Medicine

## 2023-03-20 VITALS — BP 134/81 | HR 76 | Temp 98.1°F

## 2023-03-20 DIAGNOSIS — L97129 Non-pressure chronic ulcer of left thigh with unspecified severity: Secondary | ICD-10-CM | POA: Diagnosis not present

## 2023-03-20 DIAGNOSIS — B9561 Methicillin susceptible Staphylococcus aureus infection as the cause of diseases classified elsewhere: Secondary | ICD-10-CM

## 2023-03-20 DIAGNOSIS — E1151 Type 2 diabetes mellitus with diabetic peripheral angiopathy without gangrene: Secondary | ICD-10-CM | POA: Diagnosis not present

## 2023-03-20 DIAGNOSIS — R7881 Bacteremia: Secondary | ICD-10-CM

## 2023-03-20 DIAGNOSIS — E11622 Type 2 diabetes mellitus with other skin ulcer: Secondary | ICD-10-CM | POA: Diagnosis not present

## 2023-03-20 DIAGNOSIS — D469 Myelodysplastic syndrome, unspecified: Secondary | ICD-10-CM | POA: Diagnosis not present

## 2023-03-20 DIAGNOSIS — I4819 Other persistent atrial fibrillation: Secondary | ICD-10-CM | POA: Diagnosis not present

## 2023-03-20 DIAGNOSIS — E1142 Type 2 diabetes mellitus with diabetic polyneuropathy: Secondary | ICD-10-CM | POA: Diagnosis not present

## 2023-03-20 NOTE — Progress Notes (Signed)
   Subjective:    Patient ID: David Joseph, male    DOB: April 16, 1940, 83 y.o.   MRN: [id number]  HPI David Joseph is here for follow up from his recent hospitalization with MRSA bacteremia.  He has a history of MDS and getting Luspatercept  infusions and was hospitalized with fever and weakness and found the bacteremia.  He is here with his wife and feeling better.  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  Complaint of ongoing fatigue.  No associated rash or diarrhea.  Leg stump healing well.    Review of Systems  Constitutional:  Positive for fatigue. Negative for chills and fever.       Objective:   Physical Exam Eyes:     General: No scleral icterus. Pulmonary:     Effort: Pulmonary effort is normal.  Neurological:     Mental Status: He is alert.   SH: no tobacco        Assessment & Plan:

## 2023-03-20 NOTE — Telephone Encounter (Signed)
 Per Dr. Luciana Axe picc line can be removed after last dose on 1/14. Orders sent to [facility]. Confirmed by Kirtland Bouchard at [employer]. Juanita Laster, RMA

## 2023-03-20 NOTE — Assessment & Plan Note (Signed)
 He is doing well on antibiotics and no new concerns.  No changes to plan and will finish on the 14th and can pull the picc line then.   Discussed plan with him and wife and they are in agreement.  I discussed return precautions, otherwise he can follow up as needed.   I have personally spent 30 minutes involved in face-to-face and non-face-to-face activities for this patient on the day of the visit. Professional time spent includes the following activities: Preparing to see the patient (review of tests), Obtaining and/or reviewing separately obtained history (admission/discharge record), Performing a medically appropriate examination and/or evaluation , Ordering medications/tests/procedures, referring and communicating with other health care professionals, Documenting clinical information in the EMR, Independently interpreting results (not separately reported), Communicating results to the patient/family/caregiver, Counseling and educating the patient/family/caregiver and Care coordination (not separately reported).

## 2023-03-24 DIAGNOSIS — E1142 Type 2 diabetes mellitus with diabetic polyneuropathy: Secondary | ICD-10-CM | POA: Diagnosis not present

## 2023-03-24 DIAGNOSIS — I4819 Other persistent atrial fibrillation: Secondary | ICD-10-CM | POA: Diagnosis not present

## 2023-03-24 DIAGNOSIS — B9561 Methicillin susceptible Staphylococcus aureus infection as the cause of diseases classified elsewhere: Secondary | ICD-10-CM | POA: Diagnosis not present

## 2023-03-24 DIAGNOSIS — E1151 Type 2 diabetes mellitus with diabetic peripheral angiopathy without gangrene: Secondary | ICD-10-CM | POA: Diagnosis not present

## 2023-03-24 DIAGNOSIS — T8744 Infection of amputation stump, left lower extremity: Secondary | ICD-10-CM | POA: Diagnosis not present

## 2023-03-24 DIAGNOSIS — E11622 Type 2 diabetes mellitus with other skin ulcer: Secondary | ICD-10-CM | POA: Diagnosis not present

## 2023-03-24 DIAGNOSIS — D469 Myelodysplastic syndrome, unspecified: Secondary | ICD-10-CM | POA: Diagnosis not present

## 2023-03-24 DIAGNOSIS — L97129 Non-pressure chronic ulcer of left thigh with unspecified severity: Secondary | ICD-10-CM | POA: Diagnosis not present

## 2023-03-27 DIAGNOSIS — E11622 Type 2 diabetes mellitus with other skin ulcer: Secondary | ICD-10-CM | POA: Diagnosis not present

## 2023-03-27 DIAGNOSIS — R609 Edema, unspecified: Secondary | ICD-10-CM | POA: Diagnosis not present

## 2023-03-27 DIAGNOSIS — D469 Myelodysplastic syndrome, unspecified: Secondary | ICD-10-CM | POA: Diagnosis not present

## 2023-03-27 DIAGNOSIS — T8740 Infection of amputation stump, unspecified extremity: Secondary | ICD-10-CM | POA: Diagnosis not present

## 2023-03-27 DIAGNOSIS — Z6823 Body mass index (BMI) 23.0-23.9, adult: Secondary | ICD-10-CM | POA: Diagnosis not present

## 2023-03-27 DIAGNOSIS — E1151 Type 2 diabetes mellitus with diabetic peripheral angiopathy without gangrene: Secondary | ICD-10-CM | POA: Diagnosis not present

## 2023-03-27 DIAGNOSIS — I4819 Other persistent atrial fibrillation: Secondary | ICD-10-CM | POA: Diagnosis not present

## 2023-03-27 DIAGNOSIS — L97129 Non-pressure chronic ulcer of left thigh with unspecified severity: Secondary | ICD-10-CM | POA: Diagnosis not present

## 2023-03-27 DIAGNOSIS — I4891 Unspecified atrial fibrillation: Secondary | ICD-10-CM | POA: Diagnosis not present

## 2023-03-27 DIAGNOSIS — E1142 Type 2 diabetes mellitus with diabetic polyneuropathy: Secondary | ICD-10-CM | POA: Diagnosis not present

## 2023-04-01 DIAGNOSIS — T8744 Infection of amputation stump, left lower extremity: Secondary | ICD-10-CM | POA: Diagnosis not present

## 2023-04-01 DIAGNOSIS — B9561 Methicillin susceptible Staphylococcus aureus infection as the cause of diseases classified elsewhere: Secondary | ICD-10-CM | POA: Diagnosis not present

## 2023-04-01 DIAGNOSIS — E11622 Type 2 diabetes mellitus with other skin ulcer: Secondary | ICD-10-CM | POA: Diagnosis not present

## 2023-04-01 DIAGNOSIS — E1142 Type 2 diabetes mellitus with diabetic polyneuropathy: Secondary | ICD-10-CM | POA: Diagnosis not present

## 2023-04-01 DIAGNOSIS — I4819 Other persistent atrial fibrillation: Secondary | ICD-10-CM | POA: Diagnosis not present

## 2023-04-01 DIAGNOSIS — L97129 Non-pressure chronic ulcer of left thigh with unspecified severity: Secondary | ICD-10-CM | POA: Diagnosis not present

## 2023-04-01 DIAGNOSIS — D469 Myelodysplastic syndrome, unspecified: Secondary | ICD-10-CM | POA: Diagnosis not present

## 2023-04-01 DIAGNOSIS — E1151 Type 2 diabetes mellitus with diabetic peripheral angiopathy without gangrene: Secondary | ICD-10-CM | POA: Diagnosis not present

## 2023-04-02 DIAGNOSIS — Z89512 Acquired absence of left leg below knee: Secondary | ICD-10-CM | POA: Diagnosis not present

## 2023-04-02 DIAGNOSIS — R609 Edema, unspecified: Secondary | ICD-10-CM | POA: Diagnosis not present

## 2023-04-02 DIAGNOSIS — D630 Anemia in neoplastic disease: Secondary | ICD-10-CM | POA: Diagnosis not present

## 2023-04-02 DIAGNOSIS — K5792 Diverticulitis of intestine, part unspecified, without perforation or abscess without bleeding: Secondary | ICD-10-CM | POA: Diagnosis not present

## 2023-04-02 DIAGNOSIS — T8744 Infection of amputation stump, left lower extremity: Secondary | ICD-10-CM | POA: Diagnosis not present

## 2023-04-02 DIAGNOSIS — Z6822 Body mass index (BMI) 22.0-22.9, adult: Secondary | ICD-10-CM | POA: Diagnosis not present

## 2023-04-02 DIAGNOSIS — D469 Myelodysplastic syndrome, unspecified: Secondary | ICD-10-CM | POA: Diagnosis not present

## 2023-04-02 DIAGNOSIS — K819 Cholecystitis, unspecified: Secondary | ICD-10-CM | POA: Diagnosis not present

## 2023-04-02 DIAGNOSIS — M069 Rheumatoid arthritis, unspecified: Secondary | ICD-10-CM | POA: Diagnosis not present

## 2023-04-02 DIAGNOSIS — B9561 Methicillin susceptible Staphylococcus aureus infection as the cause of diseases classified elsewhere: Secondary | ICD-10-CM | POA: Diagnosis not present

## 2023-04-02 DIAGNOSIS — Z7952 Long term (current) use of systemic steroids: Secondary | ICD-10-CM | POA: Diagnosis not present

## 2023-04-02 DIAGNOSIS — Z7984 Long term (current) use of oral hypoglycemic drugs: Secondary | ICD-10-CM | POA: Diagnosis not present

## 2023-04-02 DIAGNOSIS — Z89511 Acquired absence of right leg below knee: Secondary | ICD-10-CM | POA: Diagnosis not present

## 2023-04-02 DIAGNOSIS — M5020 Other cervical disc displacement, unspecified cervical region: Secondary | ICD-10-CM | POA: Diagnosis not present

## 2023-04-02 DIAGNOSIS — I4819 Other persistent atrial fibrillation: Secondary | ICD-10-CM | POA: Diagnosis not present

## 2023-04-02 DIAGNOSIS — M1711 Unilateral primary osteoarthritis, right knee: Secondary | ICD-10-CM | POA: Diagnosis not present

## 2023-04-02 DIAGNOSIS — C7989 Secondary malignant neoplasm of other specified sites: Secondary | ICD-10-CM | POA: Diagnosis not present

## 2023-04-02 DIAGNOSIS — E876 Hypokalemia: Secondary | ICD-10-CM | POA: Diagnosis not present

## 2023-04-02 DIAGNOSIS — E1142 Type 2 diabetes mellitus with diabetic polyneuropathy: Secondary | ICD-10-CM | POA: Diagnosis not present

## 2023-04-02 DIAGNOSIS — Z7901 Long term (current) use of anticoagulants: Secondary | ICD-10-CM | POA: Diagnosis not present

## 2023-04-02 DIAGNOSIS — S81012D Laceration without foreign body, left knee, subsequent encounter: Secondary | ICD-10-CM | POA: Diagnosis not present

## 2023-04-02 DIAGNOSIS — S80821D Blister (nonthermal), right lower leg, subsequent encounter: Secondary | ICD-10-CM | POA: Diagnosis not present

## 2023-04-02 DIAGNOSIS — E1151 Type 2 diabetes mellitus with diabetic peripheral angiopathy without gangrene: Secondary | ICD-10-CM | POA: Diagnosis not present

## 2023-04-02 DIAGNOSIS — D649 Anemia, unspecified: Secondary | ICD-10-CM | POA: Diagnosis not present

## 2023-04-02 DIAGNOSIS — Z8673 Personal history of transient ischemic attack (TIA), and cerebral infarction without residual deficits: Secondary | ICD-10-CM | POA: Diagnosis not present

## 2023-04-02 DIAGNOSIS — C4492 Squamous cell carcinoma of skin, unspecified: Secondary | ICD-10-CM | POA: Diagnosis not present

## 2023-04-02 DIAGNOSIS — D472 Monoclonal gammopathy: Secondary | ICD-10-CM | POA: Diagnosis not present

## 2023-04-02 DIAGNOSIS — Z7982 Long term (current) use of aspirin: Secondary | ICD-10-CM | POA: Diagnosis not present

## 2023-04-02 DIAGNOSIS — I5022 Chronic systolic (congestive) heart failure: Secondary | ICD-10-CM | POA: Diagnosis not present

## 2023-04-04 DIAGNOSIS — A419 Sepsis, unspecified organism: Secondary | ICD-10-CM | POA: Diagnosis not present

## 2023-04-04 DIAGNOSIS — S88119A Complete traumatic amputation at level between knee and ankle, unspecified lower leg, initial encounter: Secondary | ICD-10-CM | POA: Diagnosis not present

## 2023-04-04 DIAGNOSIS — E063 Autoimmune thyroiditis: Secondary | ICD-10-CM | POA: Diagnosis not present

## 2023-04-04 DIAGNOSIS — C44320 Squamous cell carcinoma of skin of unspecified parts of face: Secondary | ICD-10-CM | POA: Diagnosis not present

## 2023-04-07 ENCOUNTER — Encounter: Payer: Self-pay | Admitting: Cardiology

## 2023-04-07 ENCOUNTER — Ambulatory Visit: Payer: Medicare Other | Attending: Cardiology | Admitting: Cardiology

## 2023-04-07 VITALS — BP 98/70 | HR 70 | Ht 68.0 in | Wt 152.0 lb

## 2023-04-07 DIAGNOSIS — E782 Mixed hyperlipidemia: Secondary | ICD-10-CM | POA: Diagnosis not present

## 2023-04-07 DIAGNOSIS — I4891 Unspecified atrial fibrillation: Secondary | ICD-10-CM | POA: Insufficient documentation

## 2023-04-07 DIAGNOSIS — I739 Peripheral vascular disease, unspecified: Secondary | ICD-10-CM | POA: Insufficient documentation

## 2023-04-07 DIAGNOSIS — I5020 Unspecified systolic (congestive) heart failure: Secondary | ICD-10-CM | POA: Diagnosis not present

## 2023-04-07 DIAGNOSIS — I4819 Other persistent atrial fibrillation: Secondary | ICD-10-CM | POA: Insufficient documentation

## 2023-04-07 DIAGNOSIS — R7989 Other specified abnormal findings of blood chemistry: Secondary | ICD-10-CM | POA: Diagnosis not present

## 2023-04-07 NOTE — Patient Instructions (Signed)
,  Medication Instructions:   Continue all current medications.   Labwork:  none  Testing/Procedures:  none  Follow-Up:  4-6 weeks   Any Other Special Instructions Will Be Listed Below (If Applicable).   If you need a refill on your cardiac medications before your next appointment, please call your pharmacy.

## 2023-04-07 NOTE — Progress Notes (Signed)
Cardiology Office Note  Date: 04/07/2023   ID: Jarry, Mihalek [birth year], MRN [id number]  History of Present Illness: David Joseph is an 83 y.o. male presenting to see me for the first time in the office today.  Prior cardiology follow-up was through the [facility] system.  He has a history of persistent atrial fibrillation status post ablation in November 2023.  CHA2DS2-VASc score is 7.  He has continued Eliquis for stroke prophylaxis.  Additional medical history includes MDS (follows with [facility] hematology-oncology), PAD status post right internal carotid stent intervention and bilateral BKA (follows with vascular at [facility]), interstitial lung disease, type 2 diabetes mellitus, previous stroke, and HFrEF.  His most recent echocardiogram in December 2024 revealed LVEF 35 to 40% range with global hypokinesis.  Records indicate recent hospitalization with fevers and abdominal pain, MSSA bacteremia.  There was initial concern for acute cholecystitis, however not ultimately felt to be the case.  He did have transaminitis, taken off statin temporarily but resumed, treated with antibiotic therapy.  Port-A-Cath was also removed.  Ultimately TEE was not pursued due to thrombocytopenia and plan to treat with 6-week course of antibiotics via PICC line.  He just recently completed course of antibiotics with removal of PICC line.  I reviewed his current medications.  Present regimen includes Eliquis, Lipitor, Lasix with potassium supplement every other day, and Toprol-XL 12.5 mg twice daily.  In discussing his symptoms it sounds like he had fairly significant trouble with fluid overload after initial hospital discharge and short-term stay in nursing home.  Since starting Lasix and potassium supplement he has done much better.  He was retaining fluid in his upper legs and thighs.  He is not aware symptomatically of any substantial recurring atrial fibrillation.  We did discuss the results of his echocardiogram  and cardiomyopathy.  GDMT is limited at this point due to increased infection risk and also relatively low blood pressure at baseline.  Physical Exam: VS:  BP 98/70   Pulse 70   Ht 5\' 8"  (1.727 m)   Wt 152 lb (68.9 kg) [facility]  SpO2 99%   BMI 23.11 kg/m , BMI Body mass index is 23.11 kg/m.  Wt Readings from Last 3 Encounters:  04/07/23 152 lb (68.9 kg)  02/24/23 170 lb 10.2 oz (77.4 kg)  04/08/22 133 lb 6.1 oz (60.5 kg)    General: Patient appears comfortable at rest.  He is in wheelchair. HEENT: Conjunctiva and lids normal, oropharynx clear. Neck: Supple, no elevated JVP or carotid bruits. Lungs: Clear to auscultation, nonlabored breathing at rest. Cardiac: Indistinct PMI, RRR with 2/6 systolic murmur. Abdomen:Bowel sounds present. Extremities: Status post bilateral BKA.  ECG:  An ECG dated 02/15/2023 was personally reviewed today and demonstrated:  Sinus tachycardia with IVCD/left anterior fascicular block, poor R wave progression.  Labwork: 02/19/2023: Magnesium 1.9 02/24/2023: ALT 11; AST 24; BUN 16; Creatinine, Ser 0.74; Hemoglobin 10.0; Platelets 93; Potassium 3.8; Sodium 137  January 2025: Hemoglobin 9.2, platelets 106, potassium 4.6, BUN 21, creatinine 1.01, GFR 75, AST 30, ALT 23  Other Studies Reviewed Today:  Echocardiogram 02/17/2023:  1. Left ventricular ejection fraction, by estimation, is 35 to 40%. Left  ventricular ejection fraction by 2D MOD biplane is 38.9 %. The left  ventricle has moderately decreased function. The left ventricle  demonstrates global hypokinesis. Left  ventricular diastolic parameters are consistent with Grade III diastolic  dysfunction (restrictive). Elevated left ventricular end-diastolic  pressure.   2. Right  ventricular systolic function is moderately reduced. The right  ventricular size is normal. There is normal pulmonary artery systolic  pressure. The estimated right ventricular systolic pressure is 21.5  mmHg.   3. Left atrial size was mildly dilated.   4. (Noted prior transseptal atrial puncture for Afib ablation at [facility] in  01/2022). Evidence of atrial level shunting detected by color flow  Doppler. There is a moderately sized secundum atrial septal defect with  predominantly left to right shunting  across the atrial septum.   5. The mitral valve is abnormal. Mild to moderate mitral valve  regurgitation.   6. The aortic valve is tricuspid. Aortic valve regurgitation is not  visualized.   7. The aortic root is calcified. Aortic dilatation noted. There is  borderline dilatation of the ascending aorta, measuring 38 mm.   8. The inferior vena cava is dilated in size with >50% respiratory  variability, suggesting right atrial pressure of 8 mmHg.   Assessment and Plan:  1.  HFrEF, LVEF 35 to 40% by echocardiogram in December 2024, global hypokinesis and restrictive diastolic filling pattern at that time.  Also moderate RV dysfunction.  Duration of cardiomyopathy uncertain, although echocardiogram through [facility] system in September 2023 indicated LVEF 40 to 45%.  He has increased infection risk with decreased mobility and also on steroids.  Had recent MSSA bacteremia as well.  Not a good candidate for SGLT2 inhibitors.  Low blood pressure also limits up titration of GDMT (ARB/ARNI/possibly Aldactone as well).  For now we will continue Toprol-XL and Lasix with potassium supplement.  Might be able to start low-dose Aldactone eventually.  We will plan to bring him back for office follow-up and can review interval lab work which she has fairly consistently through the [facility] system.  2.  Paroxysmal to persistent atrial fibrillation with CHA2DS2-VASc score of 7.  He underwent atrial fibrillation ablation through the [facility] system in November 2023.  Maintaining sinus rhythm by recent ECGs and does not report any palpitations.  Continue Eliquis for stroke prophylaxis.  3.  Mixed hyperlipidemia, LDL 35 by lab work in  April 2023.  He is on Lipitor 80 mg daily.  4.  PAD status post prior right ICA stent intervention and bilateral BKA.  Continues to follow with vascular at [facility].  Disposition:  Follow up  4 to 6 weeks.  Signed, Jonelle Sidle, M.D., F.A.C.C. [facility] at [facility]

## 2023-04-08 DIAGNOSIS — Z79899 Other long term (current) drug therapy: Secondary | ICD-10-CM | POA: Diagnosis not present

## 2023-04-08 DIAGNOSIS — M818 Other osteoporosis without current pathological fracture: Secondary | ICD-10-CM | POA: Diagnosis not present

## 2023-04-08 DIAGNOSIS — T380X5A Adverse effect of glucocorticoids and synthetic analogues, initial encounter: Secondary | ICD-10-CM | POA: Diagnosis not present

## 2023-04-08 DIAGNOSIS — D469 Myelodysplastic syndrome, unspecified: Secondary | ICD-10-CM | POA: Diagnosis not present

## 2023-04-09 DIAGNOSIS — D469 Myelodysplastic syndrome, unspecified: Secondary | ICD-10-CM | POA: Diagnosis not present

## 2023-04-09 DIAGNOSIS — E1142 Type 2 diabetes mellitus with diabetic polyneuropathy: Secondary | ICD-10-CM | POA: Diagnosis not present

## 2023-04-09 DIAGNOSIS — I4819 Other persistent atrial fibrillation: Secondary | ICD-10-CM | POA: Diagnosis not present

## 2023-04-09 DIAGNOSIS — C7989 Secondary malignant neoplasm of other specified sites: Secondary | ICD-10-CM | POA: Diagnosis not present

## 2023-04-09 DIAGNOSIS — C4492 Squamous cell carcinoma of skin, unspecified: Secondary | ICD-10-CM | POA: Diagnosis not present

## 2023-04-09 DIAGNOSIS — E1151 Type 2 diabetes mellitus with diabetic peripheral angiopathy without gangrene: Secondary | ICD-10-CM | POA: Diagnosis not present

## 2023-04-10 DIAGNOSIS — E1142 Type 2 diabetes mellitus with diabetic polyneuropathy: Secondary | ICD-10-CM | POA: Diagnosis not present

## 2023-04-10 DIAGNOSIS — T8744 Infection of amputation stump, left lower extremity: Secondary | ICD-10-CM | POA: Diagnosis not present

## 2023-04-10 DIAGNOSIS — C7989 Secondary malignant neoplasm of other specified sites: Secondary | ICD-10-CM | POA: Diagnosis not present

## 2023-04-10 DIAGNOSIS — I4819 Other persistent atrial fibrillation: Secondary | ICD-10-CM | POA: Diagnosis not present

## 2023-04-10 DIAGNOSIS — I5022 Chronic systolic (congestive) heart failure: Secondary | ICD-10-CM | POA: Diagnosis not present

## 2023-04-10 DIAGNOSIS — Z89511 Acquired absence of right leg below knee: Secondary | ICD-10-CM | POA: Diagnosis not present

## 2023-04-10 DIAGNOSIS — C4492 Squamous cell carcinoma of skin, unspecified: Secondary | ICD-10-CM | POA: Diagnosis not present

## 2023-04-10 DIAGNOSIS — E1151 Type 2 diabetes mellitus with diabetic peripheral angiopathy without gangrene: Secondary | ICD-10-CM | POA: Diagnosis not present

## 2023-04-10 DIAGNOSIS — B9561 Methicillin susceptible Staphylococcus aureus infection as the cause of diseases classified elsewhere: Secondary | ICD-10-CM | POA: Diagnosis not present

## 2023-04-10 DIAGNOSIS — D469 Myelodysplastic syndrome, unspecified: Secondary | ICD-10-CM | POA: Diagnosis not present

## 2023-04-10 DIAGNOSIS — Z89512 Acquired absence of left leg below knee: Secondary | ICD-10-CM | POA: Diagnosis not present

## 2023-04-10 DIAGNOSIS — M069 Rheumatoid arthritis, unspecified: Secondary | ICD-10-CM | POA: Diagnosis not present

## 2023-04-16 DIAGNOSIS — I4819 Other persistent atrial fibrillation: Secondary | ICD-10-CM | POA: Diagnosis not present

## 2023-04-16 DIAGNOSIS — E1142 Type 2 diabetes mellitus with diabetic polyneuropathy: Secondary | ICD-10-CM | POA: Diagnosis not present

## 2023-04-16 DIAGNOSIS — E1151 Type 2 diabetes mellitus with diabetic peripheral angiopathy without gangrene: Secondary | ICD-10-CM | POA: Diagnosis not present

## 2023-04-16 DIAGNOSIS — D469 Myelodysplastic syndrome, unspecified: Secondary | ICD-10-CM | POA: Diagnosis not present

## 2023-04-16 DIAGNOSIS — C4492 Squamous cell carcinoma of skin, unspecified: Secondary | ICD-10-CM | POA: Diagnosis not present

## 2023-04-16 DIAGNOSIS — C7989 Secondary malignant neoplasm of other specified sites: Secondary | ICD-10-CM | POA: Diagnosis not present

## 2023-04-18 DIAGNOSIS — H90A31 Mixed conductive and sensorineural hearing loss, unilateral, right ear with restricted hearing on the contralateral side: Secondary | ICD-10-CM | POA: Diagnosis not present

## 2023-04-18 DIAGNOSIS — H903 Sensorineural hearing loss, bilateral: Secondary | ICD-10-CM | POA: Diagnosis not present

## 2023-04-18 DIAGNOSIS — H90A22 Sensorineural hearing loss, unilateral, left ear, with restricted hearing on the contralateral side: Secondary | ICD-10-CM | POA: Diagnosis not present

## 2023-04-18 DIAGNOSIS — D490 Neoplasm of unspecified behavior of digestive system: Secondary | ICD-10-CM | POA: Diagnosis not present

## 2023-04-22 DIAGNOSIS — L989 Disorder of the skin and subcutaneous tissue, unspecified: Secondary | ICD-10-CM | POA: Diagnosis not present

## 2023-04-22 DIAGNOSIS — L570 Actinic keratosis: Secondary | ICD-10-CM | POA: Diagnosis not present

## 2023-04-22 DIAGNOSIS — C44329 Squamous cell carcinoma of skin of other parts of face: Secondary | ICD-10-CM | POA: Diagnosis not present

## 2023-04-23 DIAGNOSIS — Z8546 Personal history of malignant neoplasm of prostate: Secondary | ICD-10-CM | POA: Diagnosis not present

## 2023-04-23 DIAGNOSIS — T8189XD Other complications of procedures, not elsewhere classified, subsequent encounter: Secondary | ICD-10-CM | POA: Diagnosis not present

## 2023-04-23 DIAGNOSIS — Z89512 Acquired absence of left leg below knee: Secondary | ICD-10-CM | POA: Diagnosis not present

## 2023-04-23 DIAGNOSIS — E1151 Type 2 diabetes mellitus with diabetic peripheral angiopathy without gangrene: Secondary | ICD-10-CM | POA: Diagnosis not present

## 2023-04-23 DIAGNOSIS — Z86718 Personal history of other venous thrombosis and embolism: Secondary | ICD-10-CM | POA: Diagnosis not present

## 2023-04-23 DIAGNOSIS — R600 Localized edema: Secondary | ICD-10-CM | POA: Diagnosis not present

## 2023-04-23 DIAGNOSIS — D649 Anemia, unspecified: Secondary | ICD-10-CM | POA: Diagnosis not present

## 2023-04-23 DIAGNOSIS — R609 Edema, unspecified: Secondary | ICD-10-CM | POA: Diagnosis not present

## 2023-04-23 DIAGNOSIS — D469 Myelodysplastic syndrome, unspecified: Secondary | ICD-10-CM | POA: Diagnosis not present

## 2023-04-23 DIAGNOSIS — Z8673 Personal history of transient ischemic attack (TIA), and cerebral infarction without residual deficits: Secondary | ICD-10-CM | POA: Diagnosis not present

## 2023-04-23 DIAGNOSIS — R203 Hyperesthesia: Secondary | ICD-10-CM | POA: Diagnosis not present

## 2023-04-23 DIAGNOSIS — Z89511 Acquired absence of right leg below knee: Secondary | ICD-10-CM | POA: Diagnosis not present

## 2023-04-23 DIAGNOSIS — Z85828 Personal history of other malignant neoplasm of skin: Secondary | ICD-10-CM | POA: Diagnosis not present

## 2023-04-23 DIAGNOSIS — Z9181 History of falling: Secondary | ICD-10-CM | POA: Diagnosis not present

## 2023-04-23 DIAGNOSIS — M1180 Other specified crystal arthropathies, unspecified site: Secondary | ICD-10-CM | POA: Diagnosis not present

## 2023-04-23 DIAGNOSIS — I6521 Occlusion and stenosis of right carotid artery: Secondary | ICD-10-CM | POA: Diagnosis not present

## 2023-04-23 DIAGNOSIS — S81801A Unspecified open wound, right lower leg, initial encounter: Secondary | ICD-10-CM | POA: Diagnosis not present

## 2023-04-23 DIAGNOSIS — I4891 Unspecified atrial fibrillation: Secondary | ICD-10-CM | POA: Diagnosis not present

## 2023-04-24 DIAGNOSIS — I4819 Other persistent atrial fibrillation: Secondary | ICD-10-CM | POA: Diagnosis not present

## 2023-04-24 DIAGNOSIS — E1142 Type 2 diabetes mellitus with diabetic polyneuropathy: Secondary | ICD-10-CM | POA: Diagnosis not present

## 2023-04-24 DIAGNOSIS — D469 Myelodysplastic syndrome, unspecified: Secondary | ICD-10-CM | POA: Diagnosis not present

## 2023-04-24 DIAGNOSIS — C4492 Squamous cell carcinoma of skin, unspecified: Secondary | ICD-10-CM | POA: Diagnosis not present

## 2023-04-24 DIAGNOSIS — C7989 Secondary malignant neoplasm of other specified sites: Secondary | ICD-10-CM | POA: Diagnosis not present

## 2023-04-24 DIAGNOSIS — E1151 Type 2 diabetes mellitus with diabetic peripheral angiopathy without gangrene: Secondary | ICD-10-CM | POA: Diagnosis not present

## 2023-04-25 DIAGNOSIS — Z6822 Body mass index (BMI) 22.0-22.9, adult: Secondary | ICD-10-CM | POA: Diagnosis not present

## 2023-04-25 DIAGNOSIS — C44320 Squamous cell carcinoma of skin of unspecified parts of face: Secondary | ICD-10-CM | POA: Diagnosis not present

## 2023-04-25 DIAGNOSIS — H6122 Impacted cerumen, left ear: Secondary | ICD-10-CM | POA: Diagnosis not present

## 2023-04-25 DIAGNOSIS — R17 Unspecified jaundice: Secondary | ICD-10-CM | POA: Diagnosis not present

## 2023-04-25 DIAGNOSIS — C4492 Squamous cell carcinoma of skin, unspecified: Secondary | ICD-10-CM | POA: Diagnosis not present

## 2023-04-25 DIAGNOSIS — E063 Autoimmune thyroiditis: Secondary | ICD-10-CM | POA: Diagnosis not present

## 2023-04-25 DIAGNOSIS — Z1159 Encounter for screening for other viral diseases: Secondary | ICD-10-CM | POA: Diagnosis not present

## 2023-04-25 DIAGNOSIS — C07 Malignant neoplasm of parotid gland: Secondary | ICD-10-CM | POA: Diagnosis not present

## 2023-04-25 DIAGNOSIS — E876 Hypokalemia: Secondary | ICD-10-CM | POA: Diagnosis not present

## 2023-04-28 DIAGNOSIS — E063 Autoimmune thyroiditis: Secondary | ICD-10-CM | POA: Diagnosis not present

## 2023-04-28 DIAGNOSIS — C44320 Squamous cell carcinoma of skin of unspecified parts of face: Secondary | ICD-10-CM | POA: Diagnosis not present

## 2023-04-28 DIAGNOSIS — R2241 Localized swelling, mass and lump, right lower limb: Secondary | ICD-10-CM | POA: Diagnosis not present

## 2023-04-28 DIAGNOSIS — M84469A Pathological fracture, unspecified tibia and fibula, initial encounter for fracture: Secondary | ICD-10-CM | POA: Diagnosis not present

## 2023-04-29 DIAGNOSIS — D469 Myelodysplastic syndrome, unspecified: Secondary | ICD-10-CM | POA: Diagnosis not present

## 2023-04-29 DIAGNOSIS — M818 Other osteoporosis without current pathological fracture: Secondary | ICD-10-CM | POA: Diagnosis not present

## 2023-04-29 DIAGNOSIS — T380X5A Adverse effect of glucocorticoids and synthetic analogues, initial encounter: Secondary | ICD-10-CM | POA: Diagnosis not present

## 2023-04-29 DIAGNOSIS — S00419A Abrasion of unspecified ear, initial encounter: Secondary | ICD-10-CM | POA: Diagnosis not present

## 2023-04-30 DIAGNOSIS — E1142 Type 2 diabetes mellitus with diabetic polyneuropathy: Secondary | ICD-10-CM | POA: Diagnosis not present

## 2023-04-30 DIAGNOSIS — E1151 Type 2 diabetes mellitus with diabetic peripheral angiopathy without gangrene: Secondary | ICD-10-CM | POA: Diagnosis not present

## 2023-04-30 DIAGNOSIS — C7989 Secondary malignant neoplasm of other specified sites: Secondary | ICD-10-CM | POA: Diagnosis not present

## 2023-04-30 DIAGNOSIS — I4819 Other persistent atrial fibrillation: Secondary | ICD-10-CM | POA: Diagnosis not present

## 2023-04-30 DIAGNOSIS — D469 Myelodysplastic syndrome, unspecified: Secondary | ICD-10-CM | POA: Diagnosis not present

## 2023-04-30 DIAGNOSIS — C4492 Squamous cell carcinoma of skin, unspecified: Secondary | ICD-10-CM | POA: Diagnosis not present

## 2023-05-02 DIAGNOSIS — C7989 Secondary malignant neoplasm of other specified sites: Secondary | ICD-10-CM | POA: Diagnosis not present

## 2023-05-02 DIAGNOSIS — E1142 Type 2 diabetes mellitus with diabetic polyneuropathy: Secondary | ICD-10-CM | POA: Diagnosis not present

## 2023-05-02 DIAGNOSIS — T8744 Infection of amputation stump, left lower extremity: Secondary | ICD-10-CM | POA: Diagnosis not present

## 2023-05-02 DIAGNOSIS — S80821D Blister (nonthermal), right lower leg, subsequent encounter: Secondary | ICD-10-CM | POA: Diagnosis not present

## 2023-05-02 DIAGNOSIS — E1151 Type 2 diabetes mellitus with diabetic peripheral angiopathy without gangrene: Secondary | ICD-10-CM | POA: Diagnosis not present

## 2023-05-02 DIAGNOSIS — I4819 Other persistent atrial fibrillation: Secondary | ICD-10-CM | POA: Diagnosis not present

## 2023-05-02 DIAGNOSIS — Z8673 Personal history of transient ischemic attack (TIA), and cerebral infarction without residual deficits: Secondary | ICD-10-CM | POA: Diagnosis not present

## 2023-05-02 DIAGNOSIS — K5792 Diverticulitis of intestine, part unspecified, without perforation or abscess without bleeding: Secondary | ICD-10-CM | POA: Diagnosis not present

## 2023-05-02 DIAGNOSIS — Z7984 Long term (current) use of oral hypoglycemic drugs: Secondary | ICD-10-CM | POA: Diagnosis not present

## 2023-05-02 DIAGNOSIS — M069 Rheumatoid arthritis, unspecified: Secondary | ICD-10-CM | POA: Diagnosis not present

## 2023-05-02 DIAGNOSIS — D469 Myelodysplastic syndrome, unspecified: Secondary | ICD-10-CM | POA: Diagnosis not present

## 2023-05-02 DIAGNOSIS — S81012D Laceration without foreign body, left knee, subsequent encounter: Secondary | ICD-10-CM | POA: Diagnosis not present

## 2023-05-02 DIAGNOSIS — M5020 Other cervical disc displacement, unspecified cervical region: Secondary | ICD-10-CM | POA: Diagnosis not present

## 2023-05-02 DIAGNOSIS — Z89511 Acquired absence of right leg below knee: Secondary | ICD-10-CM | POA: Diagnosis not present

## 2023-05-02 DIAGNOSIS — K819 Cholecystitis, unspecified: Secondary | ICD-10-CM | POA: Diagnosis not present

## 2023-05-02 DIAGNOSIS — Z89512 Acquired absence of left leg below knee: Secondary | ICD-10-CM | POA: Diagnosis not present

## 2023-05-02 DIAGNOSIS — D472 Monoclonal gammopathy: Secondary | ICD-10-CM | POA: Diagnosis not present

## 2023-05-02 DIAGNOSIS — B9561 Methicillin susceptible Staphylococcus aureus infection as the cause of diseases classified elsewhere: Secondary | ICD-10-CM | POA: Diagnosis not present

## 2023-05-02 DIAGNOSIS — Z7901 Long term (current) use of anticoagulants: Secondary | ICD-10-CM | POA: Diagnosis not present

## 2023-05-02 DIAGNOSIS — Z7982 Long term (current) use of aspirin: Secondary | ICD-10-CM | POA: Diagnosis not present

## 2023-05-02 DIAGNOSIS — I5022 Chronic systolic (congestive) heart failure: Secondary | ICD-10-CM | POA: Diagnosis not present

## 2023-05-02 DIAGNOSIS — Z7952 Long term (current) use of systemic steroids: Secondary | ICD-10-CM | POA: Diagnosis not present

## 2023-05-02 DIAGNOSIS — M1711 Unilateral primary osteoarthritis, right knee: Secondary | ICD-10-CM | POA: Diagnosis not present

## 2023-05-02 DIAGNOSIS — D630 Anemia in neoplastic disease: Secondary | ICD-10-CM | POA: Diagnosis not present

## 2023-05-02 DIAGNOSIS — C4492 Squamous cell carcinoma of skin, unspecified: Secondary | ICD-10-CM | POA: Diagnosis not present

## 2023-05-06 DIAGNOSIS — S00419A Abrasion of unspecified ear, initial encounter: Secondary | ICD-10-CM | POA: Diagnosis not present

## 2023-05-06 DIAGNOSIS — H903 Sensorineural hearing loss, bilateral: Secondary | ICD-10-CM | POA: Diagnosis not present

## 2023-05-14 DIAGNOSIS — D469 Myelodysplastic syndrome, unspecified: Secondary | ICD-10-CM | POA: Diagnosis not present

## 2023-05-14 DIAGNOSIS — E1142 Type 2 diabetes mellitus with diabetic polyneuropathy: Secondary | ICD-10-CM | POA: Diagnosis not present

## 2023-05-14 DIAGNOSIS — C7989 Secondary malignant neoplasm of other specified sites: Secondary | ICD-10-CM | POA: Diagnosis not present

## 2023-05-14 DIAGNOSIS — E1151 Type 2 diabetes mellitus with diabetic peripheral angiopathy without gangrene: Secondary | ICD-10-CM | POA: Diagnosis not present

## 2023-05-14 DIAGNOSIS — I4819 Other persistent atrial fibrillation: Secondary | ICD-10-CM | POA: Diagnosis not present

## 2023-05-14 DIAGNOSIS — C4492 Squamous cell carcinoma of skin, unspecified: Secondary | ICD-10-CM | POA: Diagnosis not present

## 2023-05-19 DIAGNOSIS — I4819 Other persistent atrial fibrillation: Secondary | ICD-10-CM | POA: Diagnosis not present

## 2023-05-19 DIAGNOSIS — D469 Myelodysplastic syndrome, unspecified: Secondary | ICD-10-CM | POA: Diagnosis not present

## 2023-05-19 DIAGNOSIS — E1151 Type 2 diabetes mellitus with diabetic peripheral angiopathy without gangrene: Secondary | ICD-10-CM | POA: Diagnosis not present

## 2023-05-19 DIAGNOSIS — E1142 Type 2 diabetes mellitus with diabetic polyneuropathy: Secondary | ICD-10-CM | POA: Diagnosis not present

## 2023-05-19 DIAGNOSIS — E063 Autoimmune thyroiditis: Secondary | ICD-10-CM | POA: Diagnosis not present

## 2023-05-19 DIAGNOSIS — C7989 Secondary malignant neoplasm of other specified sites: Secondary | ICD-10-CM | POA: Diagnosis not present

## 2023-05-19 DIAGNOSIS — C4492 Squamous cell carcinoma of skin, unspecified: Secondary | ICD-10-CM | POA: Diagnosis not present

## 2023-05-19 DIAGNOSIS — C44320 Squamous cell carcinoma of skin of unspecified parts of face: Secondary | ICD-10-CM | POA: Diagnosis not present

## 2023-05-20 DIAGNOSIS — T380X5A Adverse effect of glucocorticoids and synthetic analogues, initial encounter: Secondary | ICD-10-CM | POA: Diagnosis not present

## 2023-05-20 DIAGNOSIS — D469 Myelodysplastic syndrome, unspecified: Secondary | ICD-10-CM | POA: Diagnosis not present

## 2023-05-20 DIAGNOSIS — M818 Other osteoporosis without current pathological fracture: Secondary | ICD-10-CM | POA: Diagnosis not present

## 2023-05-26 DIAGNOSIS — I4891 Unspecified atrial fibrillation: Secondary | ICD-10-CM | POA: Diagnosis not present

## 2023-05-26 DIAGNOSIS — Z6821 Body mass index (BMI) 21.0-21.9, adult: Secondary | ICD-10-CM | POA: Diagnosis not present

## 2023-05-26 DIAGNOSIS — E1165 Type 2 diabetes mellitus with hyperglycemia: Secondary | ICD-10-CM | POA: Diagnosis not present

## 2023-05-26 DIAGNOSIS — I10 Essential (primary) hypertension: Secondary | ICD-10-CM | POA: Diagnosis not present

## 2023-05-26 DIAGNOSIS — Z8673 Personal history of transient ischemic attack (TIA), and cerebral infarction without residual deficits: Secondary | ICD-10-CM | POA: Diagnosis not present

## 2023-05-27 ENCOUNTER — Ambulatory Visit: Payer: Medicare Other | Attending: Cardiology | Admitting: Cardiology

## 2023-05-27 ENCOUNTER — Encounter: Payer: Self-pay | Admitting: Cardiology

## 2023-05-27 VITALS — BP 94/62 | HR 67 | Ht 68.0 in | Wt 145.0 lb

## 2023-05-27 DIAGNOSIS — E782 Mixed hyperlipidemia: Secondary | ICD-10-CM | POA: Insufficient documentation

## 2023-05-27 DIAGNOSIS — I4819 Other persistent atrial fibrillation: Secondary | ICD-10-CM | POA: Diagnosis present

## 2023-05-27 DIAGNOSIS — D469 Myelodysplastic syndrome, unspecified: Secondary | ICD-10-CM | POA: Diagnosis not present

## 2023-05-27 DIAGNOSIS — E1151 Type 2 diabetes mellitus with diabetic peripheral angiopathy without gangrene: Secondary | ICD-10-CM | POA: Diagnosis not present

## 2023-05-27 DIAGNOSIS — C7989 Secondary malignant neoplasm of other specified sites: Secondary | ICD-10-CM | POA: Diagnosis not present

## 2023-05-27 DIAGNOSIS — E1142 Type 2 diabetes mellitus with diabetic polyneuropathy: Secondary | ICD-10-CM | POA: Diagnosis not present

## 2023-05-27 DIAGNOSIS — I739 Peripheral vascular disease, unspecified: Secondary | ICD-10-CM | POA: Insufficient documentation

## 2023-05-27 DIAGNOSIS — C4492 Squamous cell carcinoma of skin, unspecified: Secondary | ICD-10-CM | POA: Diagnosis not present

## 2023-05-27 DIAGNOSIS — I5020 Unspecified systolic (congestive) heart failure: Secondary | ICD-10-CM | POA: Diagnosis not present

## 2023-05-27 NOTE — Patient Instructions (Signed)
Medication Instructions:  ?Your physician recommends that you continue on your current medications as directed. Please refer to the Current Medication list given to you today. ? ? ?Labwork: ?None today ? ?Testing/Procedures: ?None today ? ?Follow-Up: ?3 months ? ?Any Other Special Instructions Will Be Listed Below (If Applicable). ? ?If you need a refill on your cardiac medications before your next appointment, please call your pharmacy. ? ?

## 2023-05-27 NOTE — Progress Notes (Signed)
    Cardiology Office Note  Date: 05/27/2023   ID: Paxon, Propes 1940/12/01, MRN [id number]  History of Present Illness: David Joseph is an 83 y.o. male last seen in January.  He is here with his wife for a follow-up visit.  From a cardiac perspective, he does not report any shortness of breath at rest, no chest pain or palpitations.  Prior edema at his BKA sites has improved.  We went over his medications.  Lasix with potassium supplement recently changed to as needed by PCP.  He otherwise remains on Toprol-XL, Eliquis, and Lipitor.  I reviewed his recent interval lab work which is noted below.  Physical Exam: VS:  BP 94/62   Pulse 67   Ht 5\' 8"  (1.727 m)   Wt 145 lb (65.8 kg)   SpO2 97%   BMI 22.05 kg/m , BMI Body mass index is 22.05 kg/m.  Wt Readings from Last 3 Encounters:  05/27/23 145 lb (65.8 kg)  04/07/23 152 lb (68.9 kg)  02/24/23 170 lb 10.2 oz (77.4 kg)    General: Patient appears comfortable at rest.  In wheelchair today. HEENT: Conjunctiva, right lid droop. Lungs: Clear to auscultation, nonlabored breathing at rest. Cardiac: RRR with 2/6 systolic murmur, no gallop. Extremities: Status post bilateral BKA.  ECG:  An ECG dated 04/07/2023 was personally reviewed today and demonstrated:  Sinus rhythm with prolonged PR interval, left anterior fascicular block and increased voltage.  Labwork: 02/19/2023: Magnesium 1.9 02/24/2023: ALT 11; AST 24; BUN 16; Creatinine, Ser 0.74; Hemoglobin 10.0; Platelets 93; Potassium 3.8; Sodium 137  March 2025: Hemoglobin 9.1, platelets 139, potassium 4.6, BUN 25, creatinine 1.01, AST 31, ALT 29  Other Studies Reviewed Today:  No interval cardiac testing for review today.  Assessment and Plan:  1.  HFrEF, LVEF 35 to 40% by echocardiogram in December 2024, global hypokinesis and restrictive diastolic filling pattern at that time.  Also moderate RV dysfunction.  Duration of cardiomyopathy uncertain, although echocardiogram  through [facility] system in September 2023 indicated LVEF 40 to 45%.  GDMT limited by both increased infection risk and relatively low blood pressure at baseline.  For now would continue Toprol-XL 12.5 mg twice daily and Lasix with potassium supplement as needed.   2.  Paroxysmal to persistent atrial fibrillation with CHA2DS2-VASc score of 7.  He underwent atrial fibrillation ablation through the [facility] system in November 2023.  Reports no palpitations and heart rate is regular today.  Continue Eliquis 5 mg twice daily for stroke prophylaxis.   3.  Mixed hyperlipidemia, LDL 35 by lab work in April 2023.  He is on Lipitor 80 mg daily.   4.  PAD status post prior right ICA stent intervention and bilateral BKA.  Continues to follow with vascular at [facility].  Disposition:  Follow up  3 months.  Signed, Jonelle Sidle, M.D., F.A.C.C. [facility] at [facility]

## 2023-06-02 DIAGNOSIS — C44329 Squamous cell carcinoma of skin of other parts of face: Secondary | ICD-10-CM | POA: Diagnosis not present

## 2023-06-02 DIAGNOSIS — H02431 Paralytic ptosis of right eyelid: Secondary | ICD-10-CM | POA: Diagnosis not present

## 2023-06-09 DIAGNOSIS — E063 Autoimmune thyroiditis: Secondary | ICD-10-CM | POA: Diagnosis not present

## 2023-06-09 DIAGNOSIS — C44320 Squamous cell carcinoma of skin of unspecified parts of face: Secondary | ICD-10-CM | POA: Diagnosis not present

## 2023-06-10 DIAGNOSIS — E119 Type 2 diabetes mellitus without complications: Secondary | ICD-10-CM | POA: Diagnosis not present

## 2023-06-10 DIAGNOSIS — C44320 Squamous cell carcinoma of skin of unspecified parts of face: Secondary | ICD-10-CM | POA: Diagnosis not present

## 2023-06-10 DIAGNOSIS — E063 Autoimmune thyroiditis: Secondary | ICD-10-CM | POA: Diagnosis not present

## 2023-06-10 DIAGNOSIS — D469 Myelodysplastic syndrome, unspecified: Secondary | ICD-10-CM | POA: Diagnosis not present

## 2023-06-10 DIAGNOSIS — Z79899 Other long term (current) drug therapy: Secondary | ICD-10-CM | POA: Diagnosis not present

## 2023-06-10 DIAGNOSIS — M818 Other osteoporosis without current pathological fracture: Secondary | ICD-10-CM | POA: Diagnosis not present

## 2023-06-10 DIAGNOSIS — T360X5A Adverse effect of penicillins, initial encounter: Secondary | ICD-10-CM | POA: Diagnosis not present

## 2023-06-10 DIAGNOSIS — Z961 Presence of intraocular lens: Secondary | ICD-10-CM | POA: Diagnosis not present

## 2023-06-10 DIAGNOSIS — H43811 Vitreous degeneration, right eye: Secondary | ICD-10-CM | POA: Diagnosis not present

## 2023-06-18 ENCOUNTER — Ambulatory Visit: Payer: Medicare Other | Admitting: Cardiology

## 2023-06-18 DIAGNOSIS — H5340 Unspecified visual field defects: Secondary | ICD-10-CM | POA: Diagnosis not present

## 2023-06-18 DIAGNOSIS — R208 Other disturbances of skin sensation: Secondary | ICD-10-CM | POA: Diagnosis not present

## 2023-06-18 DIAGNOSIS — Z789 Other specified health status: Secondary | ICD-10-CM | POA: Diagnosis not present

## 2023-06-18 DIAGNOSIS — H57811 Brow ptosis, right: Secondary | ICD-10-CM | POA: Diagnosis not present

## 2023-06-18 DIAGNOSIS — L905 Scar conditions and fibrosis of skin: Secondary | ICD-10-CM | POA: Diagnosis not present

## 2023-06-19 DIAGNOSIS — Z85828 Personal history of other malignant neoplasm of skin: Secondary | ICD-10-CM | POA: Diagnosis not present

## 2023-06-19 DIAGNOSIS — D0439 Carcinoma in situ of skin of other parts of face: Secondary | ICD-10-CM | POA: Diagnosis not present

## 2023-06-19 DIAGNOSIS — L905 Scar conditions and fibrosis of skin: Secondary | ICD-10-CM | POA: Diagnosis not present

## 2023-06-19 DIAGNOSIS — D485 Neoplasm of uncertain behavior of skin: Secondary | ICD-10-CM | POA: Diagnosis not present

## 2023-06-19 DIAGNOSIS — L7622 Postprocedural hemorrhage and hematoma of skin and subcutaneous tissue following other procedure: Secondary | ICD-10-CM | POA: Diagnosis not present

## 2023-06-30 DIAGNOSIS — E063 Autoimmune thyroiditis: Secondary | ICD-10-CM | POA: Diagnosis not present

## 2023-06-30 DIAGNOSIS — D469 Myelodysplastic syndrome, unspecified: Secondary | ICD-10-CM | POA: Diagnosis not present

## 2023-06-30 DIAGNOSIS — C44320 Squamous cell carcinoma of skin of unspecified parts of face: Secondary | ICD-10-CM | POA: Diagnosis not present

## 2023-06-30 DIAGNOSIS — D472 Monoclonal gammopathy: Secondary | ICD-10-CM | POA: Diagnosis not present

## 2023-07-01 DIAGNOSIS — M1120 Other chondrocalcinosis, unspecified site: Secondary | ICD-10-CM | POA: Diagnosis not present

## 2023-07-01 DIAGNOSIS — Z79899 Other long term (current) drug therapy: Secondary | ICD-10-CM | POA: Diagnosis not present

## 2023-07-01 DIAGNOSIS — M25641 Stiffness of right hand, not elsewhere classified: Secondary | ICD-10-CM | POA: Diagnosis not present

## 2023-07-01 DIAGNOSIS — M19021 Primary osteoarthritis, right elbow: Secondary | ICD-10-CM | POA: Diagnosis not present

## 2023-07-01 DIAGNOSIS — Z7982 Long term (current) use of aspirin: Secondary | ICD-10-CM | POA: Diagnosis not present

## 2023-07-01 DIAGNOSIS — M79641 Pain in right hand: Secondary | ICD-10-CM | POA: Diagnosis not present

## 2023-07-01 DIAGNOSIS — C4492 Squamous cell carcinoma of skin, unspecified: Secondary | ICD-10-CM | POA: Diagnosis not present

## 2023-07-01 DIAGNOSIS — Z8739 Personal history of other diseases of the musculoskeletal system and connective tissue: Secondary | ICD-10-CM | POA: Diagnosis not present

## 2023-07-01 DIAGNOSIS — D469 Myelodysplastic syndrome, unspecified: Secondary | ICD-10-CM | POA: Diagnosis not present

## 2023-07-01 DIAGNOSIS — I739 Peripheral vascular disease, unspecified: Secondary | ICD-10-CM | POA: Diagnosis not present

## 2023-07-01 DIAGNOSIS — M19022 Primary osteoarthritis, left elbow: Secondary | ICD-10-CM | POA: Diagnosis not present

## 2023-07-01 DIAGNOSIS — G546 Phantom limb syndrome with pain: Secondary | ICD-10-CM | POA: Diagnosis not present

## 2023-07-01 DIAGNOSIS — M7032 Other bursitis of elbow, left elbow: Secondary | ICD-10-CM | POA: Diagnosis not present

## 2023-07-01 DIAGNOSIS — Z7901 Long term (current) use of anticoagulants: Secondary | ICD-10-CM | POA: Diagnosis not present

## 2023-07-01 DIAGNOSIS — Z7952 Long term (current) use of systemic steroids: Secondary | ICD-10-CM | POA: Diagnosis not present

## 2023-07-01 DIAGNOSIS — M7031 Other bursitis of elbow, right elbow: Secondary | ICD-10-CM | POA: Diagnosis not present

## 2023-07-01 DIAGNOSIS — M7989 Other specified soft tissue disorders: Secondary | ICD-10-CM | POA: Diagnosis not present

## 2023-07-01 DIAGNOSIS — M79642 Pain in left hand: Secondary | ICD-10-CM | POA: Diagnosis not present

## 2023-07-01 DIAGNOSIS — M8580 Other specified disorders of bone density and structure, unspecified site: Secondary | ICD-10-CM | POA: Diagnosis not present

## 2023-07-01 DIAGNOSIS — R200 Anesthesia of skin: Secondary | ICD-10-CM | POA: Diagnosis not present

## 2023-07-01 DIAGNOSIS — M0600 Rheumatoid arthritis without rheumatoid factor, unspecified site: Secondary | ICD-10-CM | POA: Diagnosis not present

## 2023-07-02 DIAGNOSIS — T380X5A Adverse effect of glucocorticoids and synthetic analogues, initial encounter: Secondary | ICD-10-CM | POA: Diagnosis not present

## 2023-07-02 DIAGNOSIS — M818 Other osteoporosis without current pathological fracture: Secondary | ICD-10-CM | POA: Diagnosis not present

## 2023-07-02 DIAGNOSIS — D469 Myelodysplastic syndrome, unspecified: Secondary | ICD-10-CM | POA: Diagnosis not present

## 2023-07-04 DIAGNOSIS — R600 Localized edema: Secondary | ICD-10-CM | POA: Diagnosis not present

## 2023-07-04 DIAGNOSIS — L89899 Pressure ulcer of other site, unspecified stage: Secondary | ICD-10-CM | POA: Diagnosis not present

## 2023-07-08 DIAGNOSIS — S88111D Complete traumatic amputation at level between knee and ankle, right lower leg, subsequent encounter: Secondary | ICD-10-CM | POA: Diagnosis not present

## 2023-07-08 DIAGNOSIS — S88112D Complete traumatic amputation at level between knee and ankle, left lower leg, subsequent encounter: Secondary | ICD-10-CM | POA: Diagnosis not present

## 2023-07-09 DIAGNOSIS — D0439 Carcinoma in situ of skin of other parts of face: Secondary | ICD-10-CM | POA: Diagnosis not present

## 2023-07-13 DIAGNOSIS — Z452 Encounter for adjustment and management of vascular access device: Secondary | ICD-10-CM | POA: Diagnosis not present

## 2023-07-13 DIAGNOSIS — M1180 Other specified crystal arthropathies, unspecified site: Secondary | ICD-10-CM | POA: Diagnosis not present

## 2023-07-13 DIAGNOSIS — R918 Other nonspecific abnormal finding of lung field: Secondary | ICD-10-CM | POA: Diagnosis not present

## 2023-07-13 DIAGNOSIS — R7401 Elevation of levels of liver transaminase levels: Secondary | ICD-10-CM | POA: Diagnosis not present

## 2023-07-13 DIAGNOSIS — D469 Myelodysplastic syndrome, unspecified: Secondary | ICD-10-CM | POA: Diagnosis not present

## 2023-07-13 DIAGNOSIS — I3139 Other pericardial effusion (noninflammatory): Secondary | ICD-10-CM | POA: Diagnosis not present

## 2023-07-13 DIAGNOSIS — Z794 Long term (current) use of insulin: Secondary | ICD-10-CM | POA: Diagnosis not present

## 2023-07-13 DIAGNOSIS — E1151 Type 2 diabetes mellitus with diabetic peripheral angiopathy without gangrene: Secondary | ICD-10-CM | POA: Diagnosis present

## 2023-07-13 DIAGNOSIS — C4492 Squamous cell carcinoma of skin, unspecified: Secondary | ICD-10-CM | POA: Diagnosis not present

## 2023-07-13 DIAGNOSIS — Z7982 Long term (current) use of aspirin: Secondary | ICD-10-CM | POA: Diagnosis not present

## 2023-07-13 DIAGNOSIS — I110 Hypertensive heart disease with heart failure: Secondary | ICD-10-CM | POA: Diagnosis present

## 2023-07-13 DIAGNOSIS — R7989 Other specified abnormal findings of blood chemistry: Secondary | ICD-10-CM | POA: Diagnosis not present

## 2023-07-13 DIAGNOSIS — D472 Monoclonal gammopathy: Secondary | ICD-10-CM | POA: Diagnosis not present

## 2023-07-13 DIAGNOSIS — C44729 Squamous cell carcinoma of skin of left lower limb, including hip: Secondary | ICD-10-CM | POA: Diagnosis not present

## 2023-07-13 DIAGNOSIS — Z7952 Long term (current) use of systemic steroids: Secondary | ICD-10-CM | POA: Diagnosis not present

## 2023-07-13 DIAGNOSIS — R0602 Shortness of breath: Secondary | ICD-10-CM | POA: Diagnosis not present

## 2023-07-13 DIAGNOSIS — K828 Other specified diseases of gallbladder: Secondary | ICD-10-CM | POA: Diagnosis not present

## 2023-07-13 DIAGNOSIS — Z91041 Radiographic dye allergy status: Secondary | ICD-10-CM | POA: Diagnosis not present

## 2023-07-13 DIAGNOSIS — Z1152 Encounter for screening for COVID-19: Secondary | ICD-10-CM | POA: Diagnosis not present

## 2023-07-13 DIAGNOSIS — R109 Unspecified abdominal pain: Secondary | ICD-10-CM | POA: Diagnosis not present

## 2023-07-13 DIAGNOSIS — A419 Sepsis, unspecified organism: Secondary | ICD-10-CM | POA: Diagnosis present

## 2023-07-13 DIAGNOSIS — I739 Peripheral vascular disease, unspecified: Secondary | ICD-10-CM | POA: Diagnosis not present

## 2023-07-13 DIAGNOSIS — K811 Chronic cholecystitis: Secondary | ICD-10-CM | POA: Diagnosis present

## 2023-07-13 DIAGNOSIS — D599 Acquired hemolytic anemia, unspecified: Secondary | ICD-10-CM | POA: Diagnosis not present

## 2023-07-13 DIAGNOSIS — R531 Weakness: Secondary | ICD-10-CM | POA: Diagnosis not present

## 2023-07-13 DIAGNOSIS — K4090 Unilateral inguinal hernia, without obstruction or gangrene, not specified as recurrent: Secondary | ICD-10-CM | POA: Diagnosis present

## 2023-07-13 DIAGNOSIS — Z87891 Personal history of nicotine dependence: Secondary | ICD-10-CM | POA: Diagnosis not present

## 2023-07-13 DIAGNOSIS — K838 Other specified diseases of biliary tract: Secondary | ICD-10-CM | POA: Diagnosis not present

## 2023-07-13 DIAGNOSIS — R197 Diarrhea, unspecified: Secondary | ICD-10-CM | POA: Diagnosis present

## 2023-07-13 DIAGNOSIS — Z792 Long term (current) use of antibiotics: Secondary | ICD-10-CM | POA: Diagnosis not present

## 2023-07-13 DIAGNOSIS — I480 Paroxysmal atrial fibrillation: Secondary | ICD-10-CM | POA: Diagnosis not present

## 2023-07-13 DIAGNOSIS — E860 Dehydration: Secondary | ICD-10-CM | POA: Diagnosis present

## 2023-07-13 DIAGNOSIS — K8020 Calculus of gallbladder without cholecystitis without obstruction: Secondary | ICD-10-CM | POA: Diagnosis not present

## 2023-07-13 DIAGNOSIS — N179 Acute kidney failure, unspecified: Secondary | ICD-10-CM | POA: Diagnosis not present

## 2023-07-13 DIAGNOSIS — I4891 Unspecified atrial fibrillation: Secondary | ICD-10-CM | POA: Diagnosis present

## 2023-07-13 DIAGNOSIS — I959 Hypotension, unspecified: Secondary | ICD-10-CM | POA: Diagnosis not present

## 2023-07-13 DIAGNOSIS — R000 Tachycardia, unspecified: Secondary | ICD-10-CM | POA: Diagnosis not present

## 2023-07-13 DIAGNOSIS — E872 Acidosis, unspecified: Secondary | ICD-10-CM | POA: Diagnosis present

## 2023-07-13 DIAGNOSIS — Z8673 Personal history of transient ischemic attack (TIA), and cerebral infarction without residual deficits: Secondary | ICD-10-CM | POA: Diagnosis not present

## 2023-07-13 DIAGNOSIS — M1120 Other chondrocalcinosis, unspecified site: Secondary | ICD-10-CM | POA: Diagnosis not present

## 2023-07-13 DIAGNOSIS — I5042 Chronic combined systolic (congestive) and diastolic (congestive) heart failure: Secondary | ICD-10-CM | POA: Diagnosis present

## 2023-07-13 DIAGNOSIS — Z7984 Long term (current) use of oral hypoglycemic drugs: Secondary | ICD-10-CM | POA: Diagnosis not present

## 2023-07-13 DIAGNOSIS — R6521 Severe sepsis with septic shock: Secondary | ICD-10-CM | POA: Diagnosis present

## 2023-07-13 DIAGNOSIS — N170 Acute kidney failure with tubular necrosis: Secondary | ICD-10-CM | POA: Diagnosis present

## 2023-07-13 DIAGNOSIS — N281 Cyst of kidney, acquired: Secondary | ICD-10-CM | POA: Diagnosis not present

## 2023-07-13 DIAGNOSIS — K8080 Other cholelithiasis without obstruction: Secondary | ICD-10-CM | POA: Diagnosis not present

## 2023-07-13 DIAGNOSIS — Z7901 Long term (current) use of anticoagulants: Secondary | ICD-10-CM | POA: Diagnosis not present

## 2023-07-13 DIAGNOSIS — N200 Calculus of kidney: Secondary | ICD-10-CM | POA: Diagnosis not present

## 2023-07-16 ENCOUNTER — Inpatient Hospital Stay (HOSPITAL_COMMUNITY)

## 2023-07-16 ENCOUNTER — Inpatient Hospital Stay (HOSPITAL_COMMUNITY): Admitting: Certified Registered Nurse Anesthetist

## 2023-07-16 ENCOUNTER — Encounter (HOSPITAL_COMMUNITY): Payer: Self-pay

## 2023-07-16 ENCOUNTER — Encounter (HOSPITAL_COMMUNITY): Payer: Self-pay | Admitting: Internal Medicine

## 2023-07-16 ENCOUNTER — Inpatient Hospital Stay (HOSPITAL_COMMUNITY)
Admission: EM | Admit: 2023-07-16 | Discharge: 2023-08-17 | DRG: 871 | Disposition: E | Source: Other Acute Inpatient Hospital | Attending: Pulmonary Disease | Admitting: Pulmonary Disease

## 2023-07-16 DIAGNOSIS — I429 Cardiomyopathy, unspecified: Secondary | ICD-10-CM | POA: Diagnosis present

## 2023-07-16 DIAGNOSIS — I5023 Acute on chronic systolic (congestive) heart failure: Secondary | ICD-10-CM | POA: Diagnosis present

## 2023-07-16 DIAGNOSIS — Z89511 Acquired absence of right leg below knee: Secondary | ICD-10-CM | POA: Diagnosis not present

## 2023-07-16 DIAGNOSIS — N170 Acute kidney failure with tubular necrosis: Secondary | ICD-10-CM | POA: Diagnosis present

## 2023-07-16 DIAGNOSIS — Z85828 Personal history of other malignant neoplasm of skin: Secondary | ICD-10-CM | POA: Diagnosis not present

## 2023-07-16 DIAGNOSIS — Z89512 Acquired absence of left leg below knee: Secondary | ICD-10-CM | POA: Diagnosis not present

## 2023-07-16 DIAGNOSIS — M069 Rheumatoid arthritis, unspecified: Secondary | ICD-10-CM | POA: Diagnosis present

## 2023-07-16 DIAGNOSIS — R7989 Other specified abnormal findings of blood chemistry: Secondary | ICD-10-CM | POA: Diagnosis not present

## 2023-07-16 DIAGNOSIS — I480 Paroxysmal atrial fibrillation: Secondary | ICD-10-CM | POA: Diagnosis present

## 2023-07-16 DIAGNOSIS — Z7901 Long term (current) use of anticoagulants: Secondary | ICD-10-CM

## 2023-07-16 DIAGNOSIS — C4492 Squamous cell carcinoma of skin, unspecified: Secondary | ICD-10-CM | POA: Diagnosis not present

## 2023-07-16 DIAGNOSIS — Z7952 Long term (current) use of systemic steroids: Secondary | ICD-10-CM

## 2023-07-16 DIAGNOSIS — D469 Myelodysplastic syndrome, unspecified: Secondary | ICD-10-CM | POA: Diagnosis present

## 2023-07-16 DIAGNOSIS — I5042 Chronic combined systolic (congestive) and diastolic (congestive) heart failure: Secondary | ICD-10-CM | POA: Diagnosis not present

## 2023-07-16 DIAGNOSIS — Z4682 Encounter for fitting and adjustment of non-vascular catheter: Secondary | ICD-10-CM | POA: Diagnosis not present

## 2023-07-16 DIAGNOSIS — G9341 Metabolic encephalopathy: Secondary | ICD-10-CM | POA: Diagnosis present

## 2023-07-16 DIAGNOSIS — Z79899 Other long term (current) drug therapy: Secondary | ICD-10-CM

## 2023-07-16 DIAGNOSIS — K8020 Calculus of gallbladder without cholecystitis without obstruction: Secondary | ICD-10-CM | POA: Diagnosis not present

## 2023-07-16 DIAGNOSIS — I739 Peripheral vascular disease, unspecified: Secondary | ICD-10-CM | POA: Diagnosis not present

## 2023-07-16 DIAGNOSIS — Z7189 Other specified counseling: Secondary | ICD-10-CM

## 2023-07-16 DIAGNOSIS — J849 Interstitial pulmonary disease, unspecified: Secondary | ICD-10-CM | POA: Diagnosis present

## 2023-07-16 DIAGNOSIS — I5022 Chronic systolic (congestive) heart failure: Secondary | ICD-10-CM | POA: Diagnosis not present

## 2023-07-16 DIAGNOSIS — M109 Gout, unspecified: Secondary | ICD-10-CM | POA: Diagnosis present

## 2023-07-16 DIAGNOSIS — Z87891 Personal history of nicotine dependence: Secondary | ICD-10-CM | POA: Diagnosis not present

## 2023-07-16 DIAGNOSIS — Z86718 Personal history of other venous thrombosis and embolism: Secondary | ICD-10-CM

## 2023-07-16 DIAGNOSIS — Z66 Do not resuscitate: Secondary | ICD-10-CM | POA: Diagnosis present

## 2023-07-16 DIAGNOSIS — Z8249 Family history of ischemic heart disease and other diseases of the circulatory system: Secondary | ICD-10-CM

## 2023-07-16 DIAGNOSIS — R7401 Elevation of levels of liver transaminase levels: Secondary | ICD-10-CM

## 2023-07-16 DIAGNOSIS — Z452 Encounter for adjustment and management of vascular access device: Secondary | ICD-10-CM | POA: Diagnosis not present

## 2023-07-16 DIAGNOSIS — R0902 Hypoxemia: Secondary | ICD-10-CM | POA: Diagnosis present

## 2023-07-16 DIAGNOSIS — K812 Acute cholecystitis with chronic cholecystitis: Secondary | ICD-10-CM | POA: Diagnosis present

## 2023-07-16 DIAGNOSIS — Z820 Family history of epilepsy and other diseases of the nervous system: Secondary | ICD-10-CM

## 2023-07-16 DIAGNOSIS — I4891 Unspecified atrial fibrillation: Secondary | ICD-10-CM | POA: Diagnosis not present

## 2023-07-16 DIAGNOSIS — A419 Sepsis, unspecified organism: Secondary | ICD-10-CM | POA: Diagnosis present

## 2023-07-16 DIAGNOSIS — N179 Acute kidney failure, unspecified: Secondary | ICD-10-CM

## 2023-07-16 DIAGNOSIS — Z792 Long term (current) use of antibiotics: Secondary | ICD-10-CM | POA: Diagnosis not present

## 2023-07-16 DIAGNOSIS — R188 Other ascites: Secondary | ICD-10-CM | POA: Diagnosis present

## 2023-07-16 DIAGNOSIS — Z96652 Presence of left artificial knee joint: Secondary | ICD-10-CM | POA: Diagnosis present

## 2023-07-16 DIAGNOSIS — K819 Cholecystitis, unspecified: Secondary | ICD-10-CM | POA: Diagnosis not present

## 2023-07-16 DIAGNOSIS — Z7984 Long term (current) use of oral hypoglycemic drugs: Secondary | ICD-10-CM

## 2023-07-16 DIAGNOSIS — N281 Cyst of kidney, acquired: Secondary | ICD-10-CM | POA: Diagnosis not present

## 2023-07-16 DIAGNOSIS — Z8546 Personal history of malignant neoplasm of prostate: Secondary | ICD-10-CM | POA: Diagnosis not present

## 2023-07-16 DIAGNOSIS — D849 Immunodeficiency, unspecified: Secondary | ICD-10-CM | POA: Diagnosis present

## 2023-07-16 DIAGNOSIS — E1151 Type 2 diabetes mellitus with diabetic peripheral angiopathy without gangrene: Secondary | ICD-10-CM | POA: Diagnosis present

## 2023-07-16 DIAGNOSIS — D599 Acquired hemolytic anemia, unspecified: Secondary | ICD-10-CM | POA: Diagnosis not present

## 2023-07-16 DIAGNOSIS — I468 Cardiac arrest due to other underlying condition: Secondary | ICD-10-CM | POA: Diagnosis present

## 2023-07-16 DIAGNOSIS — K8080 Other cholelithiasis without obstruction: Secondary | ICD-10-CM | POA: Diagnosis not present

## 2023-07-16 DIAGNOSIS — R6521 Severe sepsis with septic shock: Secondary | ICD-10-CM | POA: Diagnosis present

## 2023-07-16 DIAGNOSIS — Z8619 Personal history of other infectious and parasitic diseases: Secondary | ICD-10-CM

## 2023-07-16 DIAGNOSIS — I517 Cardiomegaly: Secondary | ICD-10-CM | POA: Diagnosis not present

## 2023-07-16 DIAGNOSIS — E119 Type 2 diabetes mellitus without complications: Secondary | ICD-10-CM | POA: Diagnosis not present

## 2023-07-16 DIAGNOSIS — I700 Atherosclerosis of aorta: Secondary | ICD-10-CM | POA: Diagnosis not present

## 2023-07-16 DIAGNOSIS — Z8673 Personal history of transient ischemic attack (TIA), and cerebral infarction without residual deficits: Secondary | ICD-10-CM

## 2023-07-16 DIAGNOSIS — E872 Acidosis, unspecified: Secondary | ICD-10-CM | POA: Diagnosis present

## 2023-07-16 DIAGNOSIS — K810 Acute cholecystitis: Secondary | ICD-10-CM

## 2023-07-16 HISTORY — PX: IR PERC CHOLECYSTOSTOMY: IMG2326

## 2023-07-16 LAB — MAGNESIUM: Magnesium: 1.9 mg/dL (ref 1.7–2.4)

## 2023-07-16 LAB — BASIC METABOLIC PANEL WITH GFR
Anion gap: 17 — ABNORMAL HIGH (ref 5–15)
BUN: 74 mg/dL — ABNORMAL HIGH (ref 8–23)
CO2: 14 mmol/L — ABNORMAL LOW (ref 22–32)
Calcium: 6.7 mg/dL — ABNORMAL LOW (ref 8.9–10.3)
Chloride: 100 mmol/L (ref 98–111)
Creatinine, Ser: 2.95 mg/dL — ABNORMAL HIGH (ref 0.61–1.24)
GFR, Estimated: 21 mL/min — ABNORMAL LOW (ref >60)
Glucose, Bld: 102 mg/dL — ABNORMAL HIGH (ref 70–99)
Potassium: 5.4 mmol/L — ABNORMAL HIGH (ref 3.5–5.1)
Sodium: 131 mmol/L — ABNORMAL LOW (ref 135–145)

## 2023-07-16 LAB — LACTIC ACID, PLASMA
Lactic Acid, Venous: 5.9 mmol/L (ref 0.5–1.9)
Lactic Acid, Venous: >9 mmol/L (ref 0.5–1.9)

## 2023-07-16 LAB — COMPREHENSIVE METABOLIC PANEL WITH GFR
ALT: 576 U/L — ABNORMAL HIGH (ref 0–44)
AST: 549 U/L — ABNORMAL HIGH (ref 15–41)
Albumin: <1.5 g/dL — ABNORMAL LOW (ref 3.5–5.0)
Alkaline Phosphatase: 56 U/L (ref 38–126)
Anion gap: 22 — ABNORMAL HIGH (ref 5–15)
BUN: 63 mg/dL — ABNORMAL HIGH (ref 8–23)
CO2: 12 mmol/L — ABNORMAL LOW (ref 22–32)
Calcium: 5.2 mg/dL — CL (ref 8.9–10.3)
Chloride: 103 mmol/L (ref 98–111)
Creatinine, Ser: 2.75 mg/dL — ABNORMAL HIGH (ref 0.61–1.24)
GFR, Estimated: 22 mL/min — ABNORMAL LOW (ref >60)
Glucose, Bld: 193 mg/dL — ABNORMAL HIGH (ref 70–99)
Potassium: 6.1 mmol/L — ABNORMAL HIGH (ref 3.5–5.1)
Sodium: 137 mmol/L (ref 135–145)
Total Bilirubin: 6.8 mg/dL — ABNORMAL HIGH (ref 0.0–1.2)
Total Protein: <3 g/dL — ABNORMAL LOW (ref 6.5–8.1)

## 2023-07-16 LAB — BLOOD GAS, VENOUS
Acid-base deficit: 14.7 mmol/L — ABNORMAL HIGH (ref 0.0–2.0)
Bicarbonate: 12.3 mmol/L — ABNORMAL LOW (ref 20.0–28.0)
O2 Saturation: 48.1 %
Patient temperature: 37
pCO2, Ven: 33 mmHg — ABNORMAL LOW (ref 44–60)
pH, Ven: 7.18 — CL (ref 7.25–7.43)
pO2, Ven: 40 mmHg (ref 32–45)

## 2023-07-16 LAB — GLUCOSE, CAPILLARY
Glucose-Capillary: 122 mg/dL — ABNORMAL HIGH (ref 70–99)
Glucose-Capillary: 77 mg/dL (ref 70–99)
Glucose-Capillary: 75 mg/dL (ref 70–99)

## 2023-07-16 LAB — TYPE AND SCREEN
ABO/RH(D): A POS
Antibody Screen: NEGATIVE

## 2023-07-16 LAB — CBC
HCT: 28 % — ABNORMAL LOW (ref 39.0–52.0)
HCT: 14.9 % — ABNORMAL LOW (ref 39.0–52.0)
Hemoglobin: 9 g/dL — ABNORMAL LOW (ref 13.0–17.0)
Hemoglobin: 4.6 g/dL — CL (ref 13.0–17.0)
MCH: 36.9 pg — ABNORMAL HIGH (ref 26.0–34.0)
MCH: 36.8 pg — ABNORMAL HIGH (ref 26.0–34.0)
MCHC: 32.1 g/dL (ref 30.0–36.0)
MCHC: 30.9 g/dL (ref 30.0–36.0)
MCV: 114.8 fL — ABNORMAL HIGH (ref 80.0–100.0)
MCV: 119.2 fL — ABNORMAL HIGH (ref 80.0–100.0)
Platelets: 85 10*3/uL — ABNORMAL LOW (ref 150–400)
Platelets: 50 10*3/uL — ABNORMAL LOW (ref 150–400)
RBC: 2.44 MIL/uL — ABNORMAL LOW (ref 4.22–5.81)
RBC: 1.25 MIL/uL — ABNORMAL LOW (ref 4.22–5.81)
WBC: 11.6 10*3/uL — ABNORMAL HIGH (ref 4.0–10.5)
WBC: 6.8 10*3/uL (ref 4.0–10.5)
nRBC: 4.8 % — ABNORMAL HIGH (ref 0.0–0.2)
nRBC: 6.2 % — ABNORMAL HIGH (ref 0.0–0.2)

## 2023-07-16 LAB — LACTATE DEHYDROGENASE: LDH: 1103 U/L — ABNORMAL HIGH (ref 98–192)

## 2023-07-16 LAB — PROTIME-INR
INR: 4.4 (ref 0.8–1.2)
Prothrombin Time: 42.1 s — ABNORMAL HIGH (ref 11.4–15.2)

## 2023-07-16 LAB — MRSA NEXT GEN BY PCR, NASAL: MRSA by PCR Next Gen: NOT DETECTED

## 2023-07-16 LAB — TROPONIN I (HIGH SENSITIVITY): Troponin I (High Sensitivity): 289 ng/L (ref <18)

## 2023-07-16 MED ORDER — PANTOPRAZOLE SODIUM 40 MG IV SOLR: 40.0000 mg | Freq: Every day | INTRAVENOUS | Status: DC

## 2023-07-16 MED ORDER — NOREPINEPHRINE 4 MG/250ML-% IV SOLN
INTRAVENOUS | Status: AC
  Filled 2023-07-16: qty 250

## 2023-07-16 MED ORDER — METRONIDAZOLE 500 MG/100ML IV SOLN: 500.0000 mg | Freq: Two times a day (BID) | INTRAVENOUS | Status: DC

## 2023-07-16 MED ORDER — NOREPINEPHRINE 4 MG/250ML-% IV SOLN: 0.0000 ug/min | INTRAVENOUS | Status: DC

## 2023-07-16 MED ORDER — MORPHINE SULFATE (PF) 2 MG/ML IV SOLN: 1.0000 mg | INTRAVENOUS | Status: DC | PRN

## 2023-07-16 MED ORDER — LINEZOLID 600 MG/300ML IV SOLN
600.0000 mg | Freq: Two times a day (BID) | INTRAVENOUS | Status: DC
  Filled 2023-07-16: qty 300

## 2023-07-16 MED ORDER — PANTOPRAZOLE SODIUM 40 MG PO TBEC: 40.0000 mg | DELAYED_RELEASE_TABLET | Freq: Every day | ORAL | Status: DC

## 2023-07-16 MED ORDER — ONDANSETRON HCL 4 MG/2ML IJ SOLN: 4.0000 mg | Freq: Four times a day (QID) | INTRAMUSCULAR | Status: DC | PRN

## 2023-07-16 MED ORDER — LIDOCAINE HCL 1 % IJ SOLN
INTRAMUSCULAR | Status: AC
  Filled 2023-07-16: qty 20

## 2023-07-16 MED ORDER — DIPHENHYDRAMINE HCL 50 MG/ML IJ SOLN
INTRAMUSCULAR | Status: AC | PRN
  Administered 2023-07-16: 25 mg via INTRAVENOUS

## 2023-07-16 MED ORDER — PHENYLEPHRINE HCL (PRESSORS) 10 MG/ML IV SOLN
INTRAVENOUS | Status: DC | PRN
  Administered 2023-07-16 (×2): 400 ug via INTRAVENOUS
  Administered 2023-07-16: 800 ug via INTRAVENOUS

## 2023-07-16 MED ORDER — PROPOFOL 10 MG/ML IV BOLUS
INTRAVENOUS | Status: DC | PRN
  Administered 2023-07-16: 10 mg via INTRAVENOUS

## 2023-07-16 MED ORDER — SODIUM CHLORIDE 0.9% IV SOLUTION: Freq: Once | INTRAVENOUS | Status: DC

## 2023-07-16 MED ORDER — EPINEPHRINE HCL 5 MG/250ML IV SOLN IN NS
INTRAVENOUS | Status: DC | PRN
  Administered 2023-07-16: 10 ug/min via INTRAVENOUS

## 2023-07-16 MED ORDER — FENTANYL CITRATE PF 50 MCG/ML IJ SOSY
25.0000 ug | PREFILLED_SYRINGE | INTRAMUSCULAR | Status: DC | PRN
Start: 2023-07-16 — End: 2023-07-16

## 2023-07-16 MED ORDER — SODIUM CHLORIDE 0.9 % IV BOLUS
500.0000 mL | Freq: Once | INTRAVENOUS | Status: AC
  Administered 2023-07-16: 500 mL via INTRAVENOUS

## 2023-07-16 MED ORDER — ONDANSETRON HCL 4 MG PO TABS: 4.0000 mg | ORAL_TABLET | Freq: Four times a day (QID) | ORAL | Status: DC | PRN

## 2023-07-16 MED ORDER — PIPERACILLIN-TAZOBACTAM 3.375 G IVPB: 3.3750 g | Freq: Three times a day (TID) | INTRAVENOUS | Status: DC

## 2023-07-16 MED ORDER — AMIODARONE HCL IN DEXTROSE 360-4.14 MG/200ML-% IV SOLN
60.0000 mg/h | INTRAVENOUS | Status: AC
  Filled 2023-07-16: qty 200

## 2023-07-16 MED ORDER — DIPHENHYDRAMINE HCL 50 MG/ML IJ SOLN
INTRAMUSCULAR | Status: AC
  Filled 2023-07-16: qty 1

## 2023-07-16 MED ORDER — HYDROCORTISONE SOD SUC (PF) 100 MG IJ SOLR
100.0000 mg | Freq: Three times a day (TID) | INTRAMUSCULAR | Status: DC
  Administered 2023-07-16: 100 mg via INTRAVENOUS
  Filled 2023-07-16: qty 2

## 2023-07-16 MED ORDER — SUCCINYLCHOLINE CHLORIDE 200 MG/10ML IV SOSY
PREFILLED_SYRINGE | INTRAVENOUS | Status: DC | PRN
  Administered 2023-07-16: 100 mg via INTRAVENOUS

## 2023-07-16 MED ORDER — NOREPINEPHRINE 4 MG/250ML-% IV SOLN
0.0000 ug/min | INTRAVENOUS | Status: DC
  Administered 2023-07-16: 10 ug/min via INTRAVENOUS

## 2023-07-16 MED ORDER — MIDAZOLAM HCL 2 MG/2ML IJ SOLN
INTRAMUSCULAR | Status: AC
  Filled 2023-07-16: qty 2

## 2023-07-16 MED ORDER — STERILE WATER FOR INJECTION IV SOLN
INTRAMUSCULAR | Status: DC
  Filled 2023-07-16 (×2): qty 1000

## 2023-07-16 MED ORDER — INSULIN ASPART 100 UNIT/ML IJ SOLN: 0.0000 [IU] | INTRAMUSCULAR | Status: DC

## 2023-07-16 MED ORDER — FENTANYL CITRATE PF 50 MCG/ML IJ SOSY: 25.0000 ug | PREFILLED_SYRINGE | INTRAMUSCULAR | Status: DC | PRN

## 2023-07-16 MED ORDER — INSULIN ASPART 100 UNIT/ML IJ SOLN: 0.0000 [IU] | Freq: Three times a day (TID) | INTRAMUSCULAR | Status: DC

## 2023-07-16 MED ORDER — VASOPRESSIN 20 UNITS/100 ML INFUSION FOR SHOCK
0.0000 [IU]/min | INTRAVENOUS | Status: DC
  Administered 2023-07-16: 0.04 [IU]/min via INTRAVENOUS
  Filled 2023-07-16: qty 100

## 2023-07-16 MED ORDER — IOHEXOL 300 MG/ML  SOLN: 50.0000 mL | Freq: Once | INTRAMUSCULAR | Status: DC | PRN

## 2023-07-16 MED ORDER — EPINEPHRINE 1 MG/10ML IJ SOSY
PREFILLED_SYRINGE | INTRAMUSCULAR | Status: DC | PRN
  Administered 2023-07-16 (×2): 1 mg via INTRAVENOUS

## 2023-07-16 MED ORDER — MIDODRINE HCL 5 MG PO TABS: 10.0000 mg | ORAL_TABLET | Freq: Three times a day (TID) | ORAL | Status: DC

## 2023-07-16 MED ORDER — MIDAZOLAM HCL 2 MG/2ML IJ SOLN
INTRAMUSCULAR | Status: AC | PRN
  Administered 2023-07-16: 1 mg via INTRAVENOUS
  Administered 2023-07-16 (×2): .5 mg via INTRAVENOUS

## 2023-07-16 MED ORDER — SODIUM BICARBONATE 8.4 % IV SOLN
INTRAVENOUS | Status: AC
  Administered 2023-07-16: 50 meq
  Filled 2023-07-16: qty 50

## 2023-07-16 MED ORDER — PANTOPRAZOLE SODIUM 40 MG IV SOLR: 40.0000 mg | Freq: Two times a day (BID) | INTRAVENOUS | Status: DC

## 2023-07-16 MED ORDER — ALBUTEROL SULFATE (2.5 MG/3ML) 0.083% IN NEBU: 2.5000 mg | INHALATION_SOLUTION | RESPIRATORY_TRACT | Status: DC | PRN

## 2023-07-16 MED ORDER — SODIUM BICARBONATE 8.4 % IV SOLN
INTRAVENOUS | Status: DC | PRN
  Administered 2023-07-16: 50 meq via INTRAVENOUS

## 2023-07-16 MED ORDER — EPINEPHRINE HCL 5 MG/250ML IV SOLN IN NS
0.5000 ug/min | INTRAVENOUS | Status: DC
  Administered 2023-07-16: 15 ug/min via INTRAVENOUS

## 2023-07-16 MED ORDER — FAMOTIDINE 20 MG PO TABS: 20.0000 mg | ORAL_TABLET | Freq: Two times a day (BID) | ORAL | Status: DC

## 2023-07-16 MED ORDER — LIDOCAINE HCL 1 % IJ SOLN
20.0000 mL | Freq: Once | INTRAMUSCULAR | Status: DC
  Filled 2023-07-16: qty 20

## 2023-07-16 MED ORDER — AMIODARONE LOAD VIA INFUSION
150.0000 mg | Freq: Once | INTRAVENOUS | Status: DC
  Filled 2023-07-16: qty 83.34

## 2023-07-16 MED ORDER — AMIODARONE HCL IN DEXTROSE 360-4.14 MG/200ML-% IV SOLN: 30.0000 mg/h | INTRAVENOUS | Status: DC

## 2023-07-16 MED ORDER — LACTATED RINGERS IV SOLN: INTRAVENOUS | Status: DC

## 2023-07-16 MED ORDER — VITAMIN K1 10 MG/ML IJ SOLN
10.0000 mg | Freq: Once | INTRAVENOUS | Status: DC
  Filled 2023-07-16: qty 1

## 2023-07-16 NOTE — Procedures (Signed)
 Interventional Radiology Procedure Note  Procedure: Percutaneous transhepatic cholecystostomy tube  Complications: None  Estimated Blood Loss: None  Recommendations: - Collapsed GB packed with stones, does not appear c/w cholecystitis - Small aspirated bile sent for culture   Signed,  Roxie Cord, MD

## 2023-07-16 NOTE — Progress Notes (Signed)
 Chaplain responded to Code Blue while pt was in IR.  Attended wife while physician gave details of what happened.  Chaplain stayed with wife while she thought through if/when/how to tell her son who lives in [place].  Wondering if son should come now or wait until Monday when he was coming for a 4 day visit.  Chaplain stepped out of room while wife on phone.  Wife's career has been in medical environment so she understands much of what the doctor has told her...and yet it is strange to hear these words ascribed to her husband.  Wife admits to being in shock at this time.  Chaplain will continue to offer ministry of presence until wife be allowed at husband's bedside.  Alexis Anger Chaplain

## 2023-07-16 NOTE — Consult Note (Signed)
 NAME:  David Joseph, MRN:  [id number], DOB:  [birth year], LOS: 0 ADMISSION DATE:  06/30/2023, CONSULTATION DATE: 07/11/2023 REFERRING MD: Gillie Lacy, MD, CHIEF COMPLAINT: Sepsis, cholecystitis  History of Present Illness:   83 year old with history of myelodysplastic syndrome, MGUS, type 2 diabetes, bilateral BKA, rheumatoid arthritis, interstitial lung disease, atrial fibrillation on Eliquis , pseudogout on chronic prednisone  Admitted from [facility] with sepsis, septic shock abdominal pain, RUQ, MRCP and subsequent HIDA that showed cholecystitis with recommendation from surgery therefore cholecystostomy tube by IR.  Initially on Vanco and Zosyn.  Changed to Vanco, cefepime  Also noted to have AKI.  On arrival to [facility] was found to be hypotensive and septic.  PCCM consulted  Pertinent  Medical History    has a past medical history of Anemia, Arthritis, Calcium  pyrophosphate deposition disease (CPPD), Diabetes 1.5, managed as type 2 (HCC), Diverticulitis, DVT (deep venous thrombosis) (HCC) (04/09/2015), Heart failure (HCC), Pneumonia (13 years ago), Presence of internal carotid stent, Prostate CA (HCC), and Rheumatoid arthritis (HCC).   Significant Hospital Events: Including procedures, antibiotic start and stop dates in addition to other pertinent events     Interim History / Subjective:    Objective   Blood pressure (!) 80/68, pulse (!) 118, height 5\' 8"  (1.727 m), weight 75.6 kg, SpO2 94%.       No intake or output data in the 24 hours ending 07/08/2023 1826 Filed Weights   06/30/2023 1605  Weight: 75.6 kg    Examination: Blood pressure (!) 80/68, pulse (!) 118, height 5\' 8"  (1.727 m), weight 75.6 kg, SpO2 94%. Gen:         Critically ill-appearing HEENT:  EOMI, sclera icteric Neck:     No masses; no thyromegaly Lungs:    Clear to auscultation bilaterally; normal respiratory effort CV:         Regular rate and rhythm; no murmurs Abd:         + bowel sounds; soft,  non-tender; no palpable masses, no distension Ext:          Bilateral BKA Skin:      Warm and dry; no rash Neuro: alert and oriented x 3 Psych: normal mood and affect   Right upper quadrant 4/28-gallstones, sludge, wall thickening concerning for cholecystitis MRCP 4/29-cholelithiasis, sludge, mild gallbladder wall thickening and pericholecystic fluid HIDA scan 9/29-no radiotracer accumulation in gallbladder   Resolved Hospital Problem list     Assessment & Plan:  Sepsis present on admission Septic shock.  Likely source cholecystitis Transfer to ICU Getting IV fluids Recheck lactic acid, labs Continue vancomycin , cefepime , Flagyl  Follow cultures Continue stress dose steroids Hold IR for percutaneous cholecystostomy tube Will also consult surgery.  Atrial fibrillation with RVR Hold metoprolol  Eliquis  held since 4/28 in anticipation of procedure  Acute kidney injury Creatinine increasing over the course of admission Monitor urine output  MGUS/myelodysplastic syndrome Chronic hemolysis History of pancytopenia On luspatercept  as outpatient.   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  as outpatient Hold for now  Diabetes SSI coverage  Best Practice (right click and "Reselect all SmartList Selections" daily)   Diet/type: NPO DVT prophylaxis SCD Pressure ulcer(s): N/A GI prophylaxis: N/A Lines: Central line Foley:  N/A Code Status:  full code Last date of multidisciplinary goals of care discussion []   Critical care time:    The patient is critically ill with multiple  organ system failure and requires high complexity decision making for assessment and support, frequent evaluation and titration of therapies, advanced monitoring, review of radiographic studies and interpretation of  complex data.   Critical Care Time devoted to patient care services, exclusive of separately billable procedures, described in this note is 55 minutes.   Damesha Lawler MD [facility] See Amion for pager  If no response to pager , please call [phone] until 7pm After 7:00 pm call Elink  [phone] 07/06/2023, 6:26 PM

## 2023-07-16 NOTE — Progress Notes (Signed)
 PCCM called to assess patient in IR post perc cholecystostomy tube placement. Patient unresponsive and hypoxic. Patient intubated by CRNA. Post intubation patient briefly lost pulses, given epi/bicarb, and received CPR w/ ROSC in 5 minutes. Placed on levo. Transported to 73M ICU.  Plan: -CXR -continue pressors for MAP goal >65 -will hold on a-line placement w/ INR 4.4 -trend troponin and lactate -check ABG -check CMP, Mag, CBC -echo -CXR -LTVV strategy with tidal volumes of 6-8 cc/kg ideal body weight -Wean PEEP/FiO2 for SpO2 >92% -VAP bundle in place -PAD protocol in place -wean sedation for RASS goal 0 to -1  JD Vira [facility] 07/15/2023, 9:26 PM  Please see [email or web address] for pager details.  From 7A-7P if no response, please call [phone]. After hours, please call ELink [phone].

## 2023-07-16 NOTE — H&P (Addendum)
 History and Physical    Patient: David Joseph:[id number] DOB: 1940/11/20 DOA: 07/03/2023 DOS: the patient was seen and examined on 07/14/2023 PCP: Lauran Pollard, MD  Patient coming from: Outside Hospital  Chief Complaint: No chief complaint on file.  HPI: David Joseph is a 83 y.o. 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  History obtained from patient's spouse at bedside and from the patient-apparently 7-10 days prior to his hospitalization at [facility]-patient was feeling fatigued-and which was not his usual self.  His appetite was poor.  He was having some episodes of nausea.  Was having some intermittent episodes of vague upper abdominal pain that used to come and go.  Pain was mostly located in the upper mid abdomen.  On the day of admission to [facility]-patient had 2-3 episodes of loose watery stools, 1 episode of dry heaving-and fever with chills.  EMS was called-patient was taken to ED at [facility] he was found to be in septic shock-initially source was unknown-however biliary source was suspected given significantly elevated bilirubin levels.  He was subsequently admitted to the ICU-started on Levophed along with broad-spectrum antibiotics.  Further workup including a HIDA scan was suspicious for cholecystitis-he was evaluated by surgery and not thought to be a candidate for cholecystectomy-and was recommended to be transferred to a tertiary center for cholecystostomy drain placement.  Patient was then accepted to [facility] for further evaluation and treatment  During my evaluation-patient is grossly icteric-slightly lethargic but relatively awake and alert.  Spouse is at bedside.  He denies  any ongoing abdominal pain currently.  He has some nausea-but no vomiting.  He appears comfortable with no shortness of breath or chest pain.   Review of Systems: As mentioned in the history of present illness. All other systems reviewed and are negative. Past Medical History:  Diagnosis Date   Anemia    Arthritis    OA   Calcium  pyrophosphate deposition disease (CPPD)    Diabetes 1.5, managed as type 2 (HCC)    induced by steroids. treated with orals   Diverticulitis    10 years ago   DVT (deep venous thrombosis) (HCC) 04/09/2015   left leg- recently   Heart failure (HCC)    Pneumonia 13 years ago   Presence of internal carotid stent    Prostate CA (HCC)    Rheumatoid arthritis (HCC)    Past Surgical History:  Procedure Laterality Date   ATRIAL FIBRILLATION ABLATION  02/12/2022   BACK SURGERY  03/18/1978   lower    basal cell areas removed  Sept 2013, 2012   left lower lip and chest   CERVICAL FUSION     COLONOSCOPY  10/17/2011   EYE SURGERY  2 years ago   both cataract   JOINT REPLACEMENT  March 2012, 02/2012   right knee, redo right knee   left middle finger surgery  05/17/2010   PORT-A-CATH REMOVAL N/A 02/18/2023   Procedure: REMOVAL PORT-A-CATH;  Surgeon: Junie Olds, MD;  Location: [facility] OR;  Service: General;  Laterality: N/A;   PROSTATECTOMY  12/16/2013   at [facility]   TONSILLECTOMY  60 years ago   TOTAL KNEE ARTHROPLASTY Left 10/09/2015   Procedure: LEFT TOTAL KNEE ARTHROPLASTY;  Surgeon:  Liliane Rei, MD;  Location: [facility] ORS;  Service: Orthopedics;  Laterality: Left;   TOTAL KNEE REVISION  02/19/2012   Procedure: TOTAL KNEE REVISION;  Surgeon: Aurther Blue, MD;  Location: [facility] ORS;  Service: Orthopedics;  Laterality: Right;   Social History:  reports that he quit smoking about 11 years ago. His smoking use included cigars. He has been exposed to tobacco smoke. He has never used smokeless tobacco. He reports that he does not currently use alcohol . He reports  that he does not use drugs.  Allergies  Allergen Reactions   Iodinated Contrast Media Rash    Can tolerate IV dye with steroid protocol    Family History  Problem Relation Age of Onset   Heart disease Mother    Anemia Father    Multiple sclerosis Brother     Prior to Admission medications   Medication Sig Start Date End Date Taking? Authorizing Provider  apixaban  (ELIQUIS ) 5 MG TABS tablet Take 5 mg by mouth 2 (two) times daily.    [provider]  atorvastatin  (LIPITOR ) 80 MG tablet Take 80 mg by mouth daily. 03/18/23   [provider]  Cholecalciferol  (VITAMIN D3) 1000 units CAPS Take 1 tablet by mouth daily.    [provider]  furosemide (LASIX) 20 MG tablet Take 20 mg by mouth as needed. 03/27/23   [provider]  glipiZIDE  (GLUCOTROL ) 5 MG tablet Take 5 mg by mouth daily.    [provider]  hydroxychloroquine  (PLAQUENIL ) 200 MG tablet Take 200 mg by mouth daily.    [provider]  lactose free nutrition (BOOST) LIQD Take 237 mLs by mouth daily.    [provider]  Luspatercept -aamt (REBLOZYL  Lonepine) Inject into the skin every 21 ( twenty-one) days.    [provider]  metoprolol  succinate (TOPROL -XL) 25 MG 24 hr tablet Take 0.5 tablets (12.5 mg total) by mouth 2 (two) times daily. 04/08/22   Love, Renay Carota, PA-C  pantoprazole  (PROTONIX ) 40 MG tablet Take 1 tablet (40 mg total) by mouth daily. 04/09/22   Love, Renay Carota, PA-C  polyethylene glycol (MIRALAX  / GLYCOLAX ) 17 g packet Take 17 g by mouth daily as needed. 04/08/22   Love, Renay Carota, PA-C  potassium chloride  SA (KLOR-CON  M) 20 MEQ tablet Take 20 mEq by mouth as needed. With Lasix 04/02/23   [provider]  predniSONE  (DELTASONE ) 5 MG tablet Take 15 mg by mouth daily.    [provider]  pregabalin  (LYRICA ) 100 MG capsule Take 1 capsule (100 mg total) by mouth 3 (three) times daily. 02/24/23   Ozell Blunt, MD  valACYclovir  (VALTREX ) 500 MG  tablet Take 500 mg by mouth daily.    [provider]    Physical Exam: Vitals:   06/17/2023 1605  BP: (!) 80/68  Pulse: (!) 118  SpO2: 94%  Weight: 75.6 kg  Height: 5\' 8"  (1.727 m)   Gen Exam:Alert awake-not in any distress-chronically sick appearing-grossly icteric. HEENT:atraumatic, normocephalic Chest: B/L clear to auscultation anteriorly CVS:S1S2 regular Abdomen: soft-mildly but diffusely tender everywhere to deep palpation.  No obvious peritoneal signs. Extremities: Bilateral BKA-stumps appear intact. Neurology: Non focal Skin: no rash  Data Reviewed: Pending  Assessment and Plan: Septic shock-presumed secondary to acute on chronic cholecystitis Immunocompromised patient on chronic steroids Blood pressure in the 80s systolic-with worsening AKI and transaminases Starting IV stress dose steroids-midodrine-but suspect needs to be transferred to the ICU for pressors-until a cholecystostomy drain can be placed.  Continue empiric cefepime  and Flagyl . PCCM consulted. IR consult placed for cholecystostomy drain-spoke with IR on-call Dr. Marne Sings to see if you could place a drain this evening.  IR to review images done at [facility] and provide further recommendations. Will touch base with general surgery.  A-fib with RVR Mild RVR with heart rate in the low 100s. Reportedly last dose of Eliquis  on 4/28 per discharge summary-holding off on starting heparin  at this time-due to potential cholecystostomy drain placement later this evening. Holding beta-blocker due to hypotension Rate will be difficult to manage-as not a great candidate for amiodarone due to ongoing severe transaminitis. Await PCCM input-likely will benefit from transfer to the ICU for close monitoring.  AKI Suspect hemodynamically mediated in the setting of septic shock Creatinine has worsened over the past several days-upon review of labs at [facility]. Keep Foley catheter in place Treat underlying sepsis  physiology Check renal ultrasound-although no obvious hydronephrosis seen on prior CT imaging, check UA to assess for proteinuria.  Worsening jaundice Has some amount of jaundice at baseline-Per spouse-this is related to MDS/chronic hemolysis/luspatercept  infusions. Suspect worsening jaundice could be due to his cholecystitis Will repeat LFTs and fractionate bilirubin. Further workup based on the persistent jaundice if levels do not improve once cholecystostomy drain placed.  History of MSSA bacteremia December 2024 Completed 6 weeks of antibiotics 04/01/2023 Reportedly all cultures done at [facility] neg  Acute on chronic systolic heart failure Appears volume overloaded already Hold off on further IV fluids-await PCCM input If blood pressure worsens-will need IV albumin in the interim.  MGUS Myelodysplastic syndrome On luspatercept  infusions every 2-3 weeks-last infusion was on 4/16.  Chronic hemolysis Checking [facility] Per spouse/prior notes-some of this is secondary to MDS/medications used to treat MDS.  CPPD on chronic steroids Holding prednisone -on IV stress dose hydrocortisone   DM-2 Hold all oral hypoglycemics SSI.  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   Total Critical time spent equals 60 minutes   Advance Care Planning:   Code Status: Full Code however does not wish to be kept alive on life support for long period of time.  Consults: PCCM, general surgery, IR  Family Communication: Spouse at bedside.  Severity of Illness: The appropriate patient status for this patient is  INPATIENT. Inpatient status is judged to be reasonable and necessary in order to provide the required intensity of service to ensure the patient's safety. The patient's presenting symptoms, physical exam findings, and initial radiographic and laboratory data in the context of their chronic comorbidities is felt to place them at high risk for further clinical deterioration. Furthermore, it is not anticipated that the patient will be medically stable for discharge from the hospital within 2 midnights of admission.   * I certify that at the point of admission it is my clinical judgment that the patient will require inpatient hospital care spanning beyond 2 midnights from the point of admission due to high intensity of service, high risk for further deterioration and high frequency of surveillance required.*  Author: Kimberly Penna, MD 06/17/2023 5:48 PM  For on call review [email or web address].

## 2023-07-16 NOTE — IPAL (Signed)
  Interdisciplinary Goals of Care Family Meeting   Date carried out: 06/17/2023  Location of the meeting: Conference room  Member's involved: Physician, Family Member or next of kin, and Other: PA-C  Durable Power of [employer] or acting medical decision maker: Wife Data processing manager    Discussion: We discussed goals of care for David Joseph . Dr. Mason Sole and I updated David Joseph in family waiting room and daughter in law over phone. Patient critically ill on multiple pressors. Concerned that patient will not survive the night. Family agreed DNR moving forward and continue supportive care.  Code status:   Code Status: Limited: Do not attempt resuscitation (DNR) -DNR-LIMITED -Do Not Intubate/DNI    Disposition: Continue current acute care  Time spent for the meeting: 35 minutes    Casimiro Cleaves, PA-C  06/27/2023, 10:31 PM

## 2023-07-16 NOTE — Progress Notes (Signed)
 eLink Physician-Brief Progress Note Patient Name: David Joseph DOB: Oct 31, 1940 MRN: [id number]   Date of Service  06/28/2023  HPI/Events of Note  83 year old with history of myelodysplastic syndrome, MGUS, type 2 diabetes, bilateral BKA, rheumatoid arthritis, interstitial lung disease, atrial fibrillation on Eliquis , pseudogout on chronic prednisone  admitted earlier for cholecystitis/AKI  to 5W35.   Post IR Cholecystostomy , become unresponsive, hypoxic. Intubated, briefly went into PEA -ROSC in 5 mins.   Camera: Arrived in ICU. Ground team on board. On levo/epi drip, in synchrony with vent. PIP 23. 100% fio2.   Data: Reviewed CxR images seen. ET, right subclavian line in place, no pneumothorax, has resuccitation pad on chest wall. Obscuring lung fileds, no obvious CHF or pneumonia.  Last LA is from 5:25 Pm was at 5.9 Hg 9. Pre procedure.   eICU Interventions  Notes reviewed Follow labs, ABG, troponin - wean pressors as tolerated. - pn amiodarone, pressors. - on PPI - bilateral BKA status.  -on ssi, goals < 180.        Intervention Category Major Interventions: Respiratory failure - evaluation and management;Other:;Shock - evaluation and management Evaluation Type: New Patient Evaluation  Rexann Catalan 06/27/2023, 9:35 PM

## 2023-07-16 NOTE — Sedation Documentation (Signed)
 When patient moved over top regular bed de stated 81% and became unresponsive.Called rapid response lose pulse PEA. Chest compression started. Intubated CRNA Code team notified CPR 5 5 mins. Pulse regained. See CRNA-

## 2023-07-16 NOTE — Sedation Documentation (Signed)
 ED Provider called at bedside.[phone]

## 2023-07-16 NOTE — Progress Notes (Signed)
 Responded to [facility]. Pt has central access in place

## 2023-07-16 NOTE — Progress Notes (Signed)
 PHARMACY ANTIBIOTIC CONSULT NOTE   David Joseph a 83 y.o. male transferred from [facility] with cholecystitis confirmed on HIDA scan, IR to place cholecystostomy tube.  Pharmacy has been consulted for Zosyn and Linezolid dosing.  Started on Zosyn and linezolid 4/27 at [facility], transitioned to Cefepime  1gm IV Q12h 4h infusion, Flagyl  500 mg IV Q12h this morning (4/30, last doses given 1034).   4/30: Scr 2.95, LA 5.9,  WBC 11.6   Estimated Creatinine Clearance: 19 mL/min (A) (by C-G formula based on SCr of 2.95 mg/dL (H)).  Plan: START Zosyn 3.375 g IV Q8H (EI)  START Linezolid 600 mg IV Q12H Monitor renal function, clinical status, C/S, de-escalation   Allergies:  Allergies  Allergen Reactions   Iodinated Contrast Media Rash    Can tolerate IV dye with steroid protocol    Filed Weights   07/01/2023 1605  Weight: 75.6 kg (166 lb 10.7 oz)       Latest Ref Rng & Units 07/09/2023    5:05 PM 02/24/2023    3:45 AM 02/23/2023    5:33 AM  CBC  WBC 4.0 - 10.5 K/uL 11.6  7.4  7.2   Hemoglobin 13.0 - 17.0 g/dL 9.0  82.9  56.2   Hematocrit 39.0 - 52.0 % 28.0  32.2  32.3   Platelets 150 - 400 K/uL 85  93  83     Antibiotics Given (last 72 hours)     None       Antimicrobials this admission: Zosyn 4/27 > 4/29, 4/30 >  Linezolid 4/30 > c Cefepime  4/30 x1 ([facility]) Flagyl  4/30 x1 ([facility])  Microbiology results: 4/30 Bcx: sent 4/30 trach aspirate: sent  4/30 MRSA PCR: sent   Thank you for allowing pharmacy to be a part of this patient's care.  Cecillia Cogan, PharmD Clinical Pharmacist 06/25/2023  8:41 PM

## 2023-07-16 NOTE — Anesthesia Procedure Notes (Signed)
 Procedure Name: Intubation Date/Time: 07/01/2023 8:55 PM  Performed by: Melinda Sprawls, CRNAPre-anesthesia Checklist: Patient identified, Emergency Drugs available, Suction available, Patient being monitored and Timeout performed Patient Re-evaluated:Patient Re-evaluated prior to induction Oxygen Delivery Method: Ambu bag Preoxygenation: Pre-oxygenation with 100% oxygen Induction Type: IV induction, Rapid sequence and Cricoid Pressure applied Laryngoscope Size: Mac and 4 Grade View: Grade I Tube type: Oral Tube size: 7.5 mm Number of attempts: 1 Airway Equipment and Method: Stylet Placement Confirmation: ETT inserted through vocal cords under direct vision, breath sounds checked- equal and bilateral and CO2 detector Secured at: 22 cm Tube secured with: Tape Dental Injury: Teeth and Oropharynx as per pre-operative assessment  Comments: Arrived to Code Blue, ROSC obtained on arrival. Pt agonally breathing and not responding to sternal rub or any painful stimulation. Decision made to intubate by team. DL x 1 with grade 1 view. Atraumatically placed, teeth and lip remain intact as pre-op. Secured with ETT holder.

## 2023-07-16 NOTE — H&P (Deleted)
 NAME:  CLEAVON HARIS, MRN:  [id number], DOB:  03/03/1941, LOS: 0 ADMISSION DATE:  06/25/2023, CONSULTATION DATE: 07/04/2023 REFERRING MD: Gillie Lacy, MD, CHIEF COMPLAINT: Sepsis, cholecystitis  History of Present Illness:   83 year old with history of myelodysplastic syndrome, MGUS, type 2 diabetes, bilateral BKA, rheumatoid arthritis, interstitial lung disease, atrial fibrillation on Eliquis , pseudogout on chronic prednisone  Admitted from [facility] with sepsis, septic shock abdominal pain, RUQ, MRCP and subsequent HIDA that showed cholecystitis with recommendation from surgery therefore cholecystostomy tube by IR.  Initially on Vanco and Zosyn.  Changed to Vanco, cefepime  Also noted to have AKI.  On arrival to [facility] was found to be hypotensive and septic.  PCCM consulted  Pertinent  Medical History    has a past medical history of Anemia, Arthritis, Calcium  pyrophosphate deposition disease (CPPD), Diabetes 1.5, managed as type 2 (HCC), Diverticulitis, DVT (deep venous thrombosis) (HCC) (04/09/2015), Heart failure (HCC), Pneumonia (13 years ago), Presence of internal carotid stent, Prostate CA (HCC), and Rheumatoid arthritis (HCC).   Significant Hospital Events: Including procedures, antibiotic start and stop dates in addition to other pertinent events     Interim History / Subjective:    Objective   Blood pressure (!) 80/68, pulse (!) 118, height 5\' 8"  (1.727 m), weight 75.6 kg, SpO2 94%.       No intake or output data in the 24 hours ending 06/17/2023 1813 Filed Weights   06/26/2023 1605  Weight: 75.6 kg    Examination: Blood pressure (!) 80/68, pulse (!) 118, height 5\' 8"  (1.727 m), weight 75.6 kg, SpO2 94%. Gen:         Critically ill-appearing HEENT:  EOMI, sclera icteric Neck:     No masses; no thyromegaly Lungs:    Clear to auscultation bilaterally; normal respiratory effort CV:         Regular rate and rhythm; no murmurs Abd:         + bowel sounds; soft,  non-tender; no palpable masses, no distension Ext:          Bilateral BKA Skin:      Warm and dry; no rash Neuro: alert and oriented x 3 Psych: normal mood and affect   Right upper quadrant 4/28-gallstones, sludge, wall thickening concerning for cholecystitis MRCP 4/29-cholelithiasis, sludge, mild gallbladder wall thickening and pericholecystic fluid HIDA scan 9/29-no radiotracer accumulation in gallbladder   Resolved Hospital Problem list     Assessment & Plan:  Sepsis present on admission Septic shock.  Likely source cholecystitis Transfer to ICU Getting IV fluids Recheck lactic acid, labs Continue vancomycin , cefepime , Flagyl  Follow cultures Continue stress dose steroids Hold IR for percutaneous cholecystostomy tube Will also consult surgery.  Atrial fibrillation with RVR Hold metoprolol  Eliquis  held since 4/28 in anticipation of procedure  Acute kidney injury Creatinine increasing over the course of admission Monitor urine output  MGUS/myelodysplastic syndrome Chronic hemolysis History of pancytopenia On luspatercept  as outpatient.   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  as outpatient Hold for now  Diabetes SSI coverage  Best Practice (right click and "Reselect all SmartList Selections" daily)   Diet/type: NPO DVT prophylaxis SCD Pressure ulcer(s): N/A GI prophylaxis: N/A Lines: Central line Foley:  N/A Code Status:  full code Last date of multidisciplinary goals of care discussion []   Critical care time:    The patient is critically ill with multiple  organ system failure and requires high complexity decision making for assessment and support, frequent evaluation and titration of therapies, advanced monitoring, review of radiographic studies and interpretation of  complex data.   Critical Care Time devoted to patient care services, exclusive of separately billable procedures, described in this note is 55 minutes.   Edris Schneck MD [employer] [facility] See Amion for pager  If no response to pager , please call [phone] until 7pm After 7:00 pm call Elink  [phone] 06/30/2023, 6:21 PM

## 2023-07-16 NOTE — Plan of Care (Addendum)
 Mr. 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  Reported complaints of abdominal pain with nausea and vomiting.  Noted to have a small DKA which was cultured and grew out MSSA.  General surgery consulted due to abdominal pain complaints.  MRCP and subsequent HIDA scan indicated concern for chronic cholecystitis and recommended cholecystostomy tube with IR.  However not available at their facility. Patient had been on vancomycin  and Zosyn, but was changed to Zyvox and cefepime .  Vital signs currently reported to be stable.  Also reported to have progressively worsening kidney function reported to be up to 3.04 (previously within normal limits) he despite being off of nephrotoxic agents.   Accepted to a progressive bed here at [facility].

## 2023-07-17 LAB — PREPARE FRESH FROZEN PLASMA: Unit division: 0

## 2023-07-17 LAB — BPAM FFP
Blood Product Expiration Date: 202505052359
Unit Type and Rh: 6200

## 2023-07-17 NOTE — Death Summary Note (Signed)
   DEATH SUMMARY   Patient Details  Name: David Joseph MRN: [id number] DOB: 1940/06/04 WUJ:[id number], Marisa Sickles, MD  Admission/Discharge Information   Admit Date:  2023-08-08  Date of Death:  08-08-23  Time of Death:  Aug 14, 2250  Length of Stay: 1 day   Principle Cause of death: Cardiopulmonary arrest secondary to septic shock as a result of Acute cholecystitis.  Hospital Diagnoses: Principal Problem:   Sepsis (HCC) Active Problems:   Goals of care, counseling/discussion   Hospital Course: 83 year old with history of myelodysplastic syndrome, MGUS, type 2 diabetes, bilateral BKA, rheumatoid arthritis, interstitial lung disease, atrial fibrillation on Eliquis , pseudogout on chronic prednisone  Admitted from [facility] with sepsis, septic shock abdominal pain, RUQ, MRCP and subsequent HIDA that showed cholecystitis with recommendation from surgery therefore cholecystostomy tube by IR.  Initially on Vanco and Zosyn.  Changed to Vanco, cefepime  Also noted to have AKI.  On arrival to [facility] was found to be hypotensive and septic. On 08/08/23 patient went to IR for cholecystotomy drain and immediately post procedure he became unresponsive.  At that time a rapid response was called and CRNA came for intubation.  Immediately following intubation (only Succinylcholine given by CRNA for intubation) he lost his pulse.  CPR started with chest compressions and he was resuscitated, please see code note.  He was then taken to ICU where he continued to be hypotensive.  He was placed on Vasopressin, Levophed and Epinephrine  drips.  He was given several Bicarb ampules to push and he was then started on a Bicarb drip.  Unfortunately he was maxed out on all drips.  His BP never recovered and continued to decrease and his HR also continued to decrease.  His wife was updated and she was then brought to bedside.  She was agreeable to make him DNR but continue with current measures.  Assessment and Plan: Patient  was pronounced deceased at 08/14/2250 on 4/40/2025.  Cause of death was Cardiopulmonary arrest secondary to Septic Shock secondary to Acute Cholecystitis.       Procedures: Cholecystostomy drain  Consultations: IR  The results of significant diagnostics from this hospitalization (including imaging, microbiology, ancillary and laboratory) are listed below for reference.   Significant Diagnostic Studies: DG CHEST PORT 1 VIEW Result Date: Aug 08, 2023 CLINICAL DATA:  Endotracheal tube placement EXAM: PORTABLE CHEST 1 VIEW COMPARISON:  02/15/2023 FINDINGS: Endotracheal tube placed with tip measuring 5.9 cm above the carina. Right PICC line with tip over the cavoatrial junction region. Cardiac enlargement. No vascular congestion, edema, or consolidation. No pleural effusion or pneumothorax. Calcification of the aorta. Postoperative changes in the cervical spine. IMPRESSION: Appliances appear in satisfactory position. Cardiac enlargement. Lungs are clear. Electronically Signed   By: Boyce Byes M.D.   On: Aug 08, 2023 21:36    Microbiology: Recent Results (from the past 240 hours)  Aerobic/Anaerobic Culture w Gram Stain (surgical/deep wound)     Status: None (Preliminary result)   Collection Time: 08-Aug-2023  8:33 PM   Specimen: BILE  Result Value Ref Range Status   Specimen Description BILE  Final   Special Requests Normal  Final   Gram Stain   Final    NO WBC SEEN NO ORGANISMS SEEN Performed at [facility], [address] [place], [place] [address]    Culture PENDING  Incomplete   Report Status PENDING  Incomplete    Time spent: 75 minutes  Signed: Claven Cumming, MD 08/08/23

## 2023-07-17 DEATH — deceased

## 2023-07-21 LAB — AEROBIC/ANAEROBIC CULTURE W GRAM STAIN (SURGICAL/DEEP WOUND)
Culture: NO GROWTH
Gram Stain: NONE SEEN
Special Requests: NORMAL

## 2023-07-21 NOTE — Anesthesia Postprocedure Evaluation (Signed)
 Anesthesia Post Note  Patient: David Joseph  Procedure(s) Performed: IR PERC CHOLECYSTOSTOMY     Patient location during evaluation: Radiology Level of consciousness: sedated Vital Signs Assessment: post-procedure vital signs reviewed and stable Respiratory status: patient on ventilator - see flowsheet for VS Anesthetic complications: no Comments: ROSC achieved following induction and intubation. IR team to transport back to ICU   No notable events documented.  Last Vitals:  Vitals:   06/28/2023 2250 06/22/2023 2252  BP: (!) 122/37   Pulse:    Resp: (!) 28 (!) 28  SpO2:      Last Pain:  Vitals:   07/08/2023 2025  PainSc: 0-No pain                 [name]

## 2023-08-17 NOTE — Progress Notes (Signed)
 RN received patient in to 33M-10 from IR, approx 2115. Patient arrived intubated, CRNA, RN and ground team present on admission.   Patient noted to have generalized bruising. Round and reactive pupils, and minimally non-purposeful response to sternal rub.   At time of admission, unable to obtain temp and pulse ox reading, multiple locations attempted. CBG 77 on admission.  Pt noted to be hypotensive. With ground team at bedside, RN began Vaso and bicarb drip. 2x bicarb push given. Vasopressor drips titrated according to verbal orders given at bedside by MD. Refer to intake and output charting for specific titration changes.

## 2023-08-17 NOTE — Anesthesia Preprocedure Evaluation (Signed)
 Anesthesia Evaluation  Patient identified by MRN, date of birth, ID band Patient unresponsive    Airway Mallampati: II  TM Distance: >3 FB Neck ROM: Full    Dental no notable dental hx.    Pulmonary former smoker     unstable     Cardiovascular Normal cardiovascular exam Rhythm:Regular Rate:Normal     Neuro/Psych    GI/Hepatic   Endo/Other  diabetes    Renal/GU      Musculoskeletal   Abdominal   Peds  Hematology   Anesthesia Other Findings Called to case during code to intubate. Emergent brief review of hx  Reproductive/Obstetrics                             Anesthesia Physical Anesthesia Plan  ASA: 5 and emergent  Anesthesia Plan: General   Post-op Pain Management:    Induction: Intravenous and Rapid sequence  PONV Risk Score and Plan:   Airway Management Planned:   Additional Equipment:   Intra-op Plan:   Post-operative Plan:   Informed Consent:   Plan Discussed with:   Anesthesia Plan Comments:        Anesthesia Quick Evaluation

## 2023-08-17 NOTE — Death Summary Note (Signed)
  DEATH SUMMARY   Patient Details  Name: David Joseph MRN: [id number] DOB: 1940-12-09  Admission/Discharge Information   Admit Date:  07/19/2023  Date of Death: Date of Death: 19-Jul-2023  Time of Death: Time of Death: 08-01-50  Length of Stay: 1  Referring Physician: Lauran Pollard, MD   Reason(s) for Hospitalization  Sepsis  Diagnoses  Preliminary cause of death:  Sepsis present on admission Septic shock due to cholecystitis Acute kidney injury secondary to ATN Lactic acidosis, metabolic acidosis Acute metabolic encephalopathy Atrial fibrillation with RVR Acute systolic heart failure Goals of care counseling, DNR  Secondary Diagnoses (including complications and co-morbidities):  MGUS/myelodysplastic syndrome Chronic hemolysis History of pancytopenia CPP/gout/rheumatoid arthritis  Chronic prednisone  Diabetes Ramsay Hunt syndrome Cardiomyopathy   Brief Hospital Course (including significant findings, care, treatment, and services provided and events leading to death)   83 year old with history of myelodysplastic syndrome, MGUS, type 2 diabetes, bilateral BKA, rheumatoid arthritis, interstitial lung disease, atrial fibrillation on Eliquis , pseudogout on chronic prednisone  Admitted from [facility] with sepsis, septic shock abdominal pain, RUQ, MRCP and subsequent HIDA that showed cholecystitis with recommendation from surgery therefore cholecystostomy tube by IR.  Initially on Vanco and Zosyn .  Changed to Vanco, cefepime  Also noted to have AKI.  On arrival to [facility] was found to be hypotensive and septic.  PCCM consulted.  He was transferred to the ICU, IR and surgery consulted.  He was started on broad antibiotics, taken for emergent percutaneous cholecystostomy by interventional radiology.  Postprocedure he had a cardiac arrest and was intubated. Placed on multiple pressors and was in profound shock which is refractory to multiple pressors, severe metabolic acidosis,  lactic acidosis.  He continued to deteriorate After discussion with family he was made DNR and later passed away after asystolic arrest   Signature:   David Reicks MD [facility] See Amion for pager  If no response to pager , please call [phone] until 7pm After 7:00 pm call Elink  [phone] 07/18/2023, 9:25 AM

## 2023-08-17 DEATH — deceased

## 2023-09-03 MED FILL — Medication: Qty: 1 | Status: AC
